# Patient Record
Sex: Female | Born: 1935 | Race: White | Hispanic: No | Smoking: Former smoker
Health system: Southern US, Community
[De-identification: ages and names within clinical notes are randomized; demographics above are authoritative.]

## PROBLEM LIST (undated history)

## (undated) DIAGNOSIS — H3530 Unspecified macular degeneration: Secondary | ICD-10-CM

## (undated) DIAGNOSIS — K649 Unspecified hemorrhoids: Secondary | ICD-10-CM

## (undated) DIAGNOSIS — N289 Disorder of kidney and ureter, unspecified: Secondary | ICD-10-CM

## (undated) DIAGNOSIS — C55 Malignant neoplasm of uterus, part unspecified: Secondary | ICD-10-CM

## (undated) DIAGNOSIS — K219 Gastro-esophageal reflux disease without esophagitis: Secondary | ICD-10-CM

## (undated) DIAGNOSIS — F32A Depression, unspecified: Secondary | ICD-10-CM

## (undated) DIAGNOSIS — K5792 Diverticulitis of intestine, part unspecified, without perforation or abscess without bleeding: Secondary | ICD-10-CM

## (undated) DIAGNOSIS — R350 Frequency of micturition: Secondary | ICD-10-CM

## (undated) DIAGNOSIS — N179 Acute kidney failure, unspecified: Secondary | ICD-10-CM

## (undated) DIAGNOSIS — C189 Malignant neoplasm of colon, unspecified: Secondary | ICD-10-CM

## (undated) DIAGNOSIS — E785 Hyperlipidemia, unspecified: Secondary | ICD-10-CM

## (undated) DIAGNOSIS — C787 Secondary malignant neoplasm of liver and intrahepatic bile duct: Secondary | ICD-10-CM

## (undated) DIAGNOSIS — H8110 Benign paroxysmal vertigo, unspecified ear: Secondary | ICD-10-CM

## (undated) DIAGNOSIS — I10 Essential (primary) hypertension: Secondary | ICD-10-CM

## (undated) DIAGNOSIS — T7840XA Allergy, unspecified, initial encounter: Secondary | ICD-10-CM

## (undated) DIAGNOSIS — F329 Major depressive disorder, single episode, unspecified: Secondary | ICD-10-CM

## (undated) HISTORY — PX: TONSILLECTOMY: SUR1361

## (undated) HISTORY — DX: Malignant neoplasm of uterus, part unspecified: C55

## (undated) HISTORY — DX: Allergy, unspecified, initial encounter: T78.40XA

## (undated) HISTORY — DX: Acute kidney failure, unspecified: N17.9

## (undated) HISTORY — PX: EYE SURGERY: SHX253

## (undated) HISTORY — PX: ABDOMINAL HYSTERECTOMY: SHX81

## (undated) HISTORY — DX: Gastro-esophageal reflux disease without esophagitis: K21.9

## (undated) HISTORY — DX: Benign paroxysmal vertigo, unspecified ear: H81.10

## (undated) HISTORY — DX: Hyperlipidemia, unspecified: E78.5

## (undated) HISTORY — DX: Unspecified hemorrhoids: K64.9

## (undated) HISTORY — DX: Diverticulitis of intestine, part unspecified, without perforation or abscess without bleeding: K57.92

---

## 2000-01-06 ENCOUNTER — Other Ambulatory Visit: Admission: RE | Admit: 2000-01-06 | Discharge: 2000-01-06 | Payer: Self-pay | Admitting: Internal Medicine

## 2000-11-12 ENCOUNTER — Other Ambulatory Visit: Admission: RE | Admit: 2000-11-12 | Discharge: 2000-11-12 | Payer: Self-pay | Admitting: Gynecology

## 2000-11-12 ENCOUNTER — Encounter (INDEPENDENT_AMBULATORY_CARE_PROVIDER_SITE_OTHER): Payer: Self-pay

## 2002-02-15 ENCOUNTER — Other Ambulatory Visit: Admission: RE | Admit: 2002-02-15 | Discharge: 2002-02-15 | Payer: Self-pay | Admitting: Gynecology

## 2002-07-12 ENCOUNTER — Other Ambulatory Visit: Admission: RE | Admit: 2002-07-12 | Discharge: 2002-07-12 | Payer: Self-pay | Admitting: Gynecology

## 2002-07-12 ENCOUNTER — Ambulatory Visit: Admission: RE | Admit: 2002-07-12 | Discharge: 2002-07-12 | Payer: Self-pay | Admitting: Gynecology

## 2002-07-12 ENCOUNTER — Encounter (INDEPENDENT_AMBULATORY_CARE_PROVIDER_SITE_OTHER): Payer: Self-pay | Admitting: Specialist

## 2002-10-24 ENCOUNTER — Other Ambulatory Visit: Admission: RE | Admit: 2002-10-24 | Discharge: 2002-10-24 | Payer: Self-pay | Admitting: Gynecology

## 2003-01-17 ENCOUNTER — Other Ambulatory Visit: Admission: RE | Admit: 2003-01-17 | Discharge: 2003-01-17 | Payer: Self-pay | Admitting: Gynecology

## 2003-01-17 ENCOUNTER — Ambulatory Visit: Admission: RE | Admit: 2003-01-17 | Discharge: 2003-01-17 | Payer: Self-pay | Admitting: Gynecology

## 2003-01-17 ENCOUNTER — Encounter (INDEPENDENT_AMBULATORY_CARE_PROVIDER_SITE_OTHER): Payer: Self-pay | Admitting: Specialist

## 2003-04-17 ENCOUNTER — Ambulatory Visit: Admission: RE | Admit: 2003-04-17 | Discharge: 2003-04-17 | Payer: Self-pay | Admitting: Gynecology

## 2003-04-17 ENCOUNTER — Other Ambulatory Visit: Admission: RE | Admit: 2003-04-17 | Discharge: 2003-04-17 | Payer: Self-pay | Admitting: Gynecology

## 2003-04-17 ENCOUNTER — Encounter (INDEPENDENT_AMBULATORY_CARE_PROVIDER_SITE_OTHER): Payer: Self-pay | Admitting: *Deleted

## 2003-09-05 ENCOUNTER — Encounter (INDEPENDENT_AMBULATORY_CARE_PROVIDER_SITE_OTHER): Payer: Self-pay | Admitting: Specialist

## 2003-09-05 ENCOUNTER — Ambulatory Visit: Admission: RE | Admit: 2003-09-05 | Discharge: 2003-09-05 | Payer: Self-pay | Admitting: Gynecology

## 2003-09-05 ENCOUNTER — Other Ambulatory Visit: Admission: RE | Admit: 2003-09-05 | Discharge: 2003-09-05 | Payer: Self-pay | Admitting: Gynecology

## 2004-01-16 ENCOUNTER — Encounter (INDEPENDENT_AMBULATORY_CARE_PROVIDER_SITE_OTHER): Payer: Self-pay | Admitting: Specialist

## 2004-01-16 ENCOUNTER — Other Ambulatory Visit: Admission: RE | Admit: 2004-01-16 | Discharge: 2004-01-16 | Payer: Self-pay | Admitting: Gynecology

## 2004-01-16 ENCOUNTER — Ambulatory Visit: Admission: RE | Admit: 2004-01-16 | Discharge: 2004-01-16 | Payer: Self-pay | Admitting: Gynecology

## 2004-07-22 ENCOUNTER — Ambulatory Visit: Admission: RE | Admit: 2004-07-22 | Discharge: 2004-07-22 | Payer: Self-pay | Admitting: Gynecology

## 2004-07-22 ENCOUNTER — Encounter (INDEPENDENT_AMBULATORY_CARE_PROVIDER_SITE_OTHER): Payer: Self-pay | Admitting: Specialist

## 2005-01-08 ENCOUNTER — Ambulatory Visit: Payer: Self-pay | Admitting: Gastroenterology

## 2005-01-09 ENCOUNTER — Ambulatory Visit: Admission: RE | Admit: 2005-01-09 | Discharge: 2005-01-09 | Payer: Self-pay | Admitting: Gynecology

## 2005-01-21 ENCOUNTER — Ambulatory Visit: Payer: Self-pay | Admitting: Gastroenterology

## 2005-07-17 ENCOUNTER — Ambulatory Visit: Admission: RE | Admit: 2005-07-17 | Discharge: 2005-07-17 | Payer: Self-pay | Admitting: Gynecology

## 2005-07-17 ENCOUNTER — Encounter (INDEPENDENT_AMBULATORY_CARE_PROVIDER_SITE_OTHER): Payer: Self-pay | Admitting: Specialist

## 2005-07-17 ENCOUNTER — Other Ambulatory Visit: Admission: RE | Admit: 2005-07-17 | Discharge: 2005-07-17 | Payer: Self-pay | Admitting: Gynecology

## 2006-01-20 ENCOUNTER — Other Ambulatory Visit: Admission: RE | Admit: 2006-01-20 | Discharge: 2006-01-20 | Payer: Self-pay | Admitting: Gynecology

## 2006-01-20 ENCOUNTER — Ambulatory Visit: Admission: RE | Admit: 2006-01-20 | Discharge: 2006-01-20 | Payer: Self-pay | Admitting: Gynecology

## 2006-01-20 ENCOUNTER — Encounter (INDEPENDENT_AMBULATORY_CARE_PROVIDER_SITE_OTHER): Payer: Self-pay | Admitting: Specialist

## 2006-07-30 ENCOUNTER — Ambulatory Visit: Admission: RE | Admit: 2006-07-30 | Discharge: 2006-07-30 | Payer: Self-pay | Admitting: Gynecology

## 2006-07-30 ENCOUNTER — Other Ambulatory Visit: Admission: RE | Admit: 2006-07-30 | Discharge: 2006-07-30 | Payer: Self-pay | Admitting: Gynecology

## 2007-01-18 ENCOUNTER — Encounter (INDEPENDENT_AMBULATORY_CARE_PROVIDER_SITE_OTHER): Payer: Self-pay | Admitting: *Deleted

## 2007-01-18 ENCOUNTER — Ambulatory Visit: Admission: RE | Admit: 2007-01-18 | Discharge: 2007-01-18 | Payer: Self-pay | Admitting: Gynecology

## 2007-01-18 ENCOUNTER — Other Ambulatory Visit: Admission: RE | Admit: 2007-01-18 | Discharge: 2007-01-18 | Payer: Self-pay | Admitting: Gynecology

## 2008-01-06 ENCOUNTER — Encounter: Payer: Self-pay | Admitting: Gynecology

## 2008-01-06 ENCOUNTER — Ambulatory Visit: Admission: RE | Admit: 2008-01-06 | Discharge: 2008-01-06 | Payer: Self-pay | Admitting: Gynecology

## 2008-01-06 ENCOUNTER — Other Ambulatory Visit: Admission: RE | Admit: 2008-01-06 | Discharge: 2008-01-06 | Payer: Self-pay | Admitting: Gynecology

## 2009-02-01 ENCOUNTER — Encounter: Payer: Self-pay | Admitting: Gynecology

## 2009-02-01 ENCOUNTER — Other Ambulatory Visit: Admission: RE | Admit: 2009-02-01 | Discharge: 2009-02-01 | Payer: Self-pay | Admitting: Gynecology

## 2009-02-01 ENCOUNTER — Ambulatory Visit: Admission: RE | Admit: 2009-02-01 | Discharge: 2009-02-01 | Payer: Self-pay | Admitting: Gynecology

## 2009-03-21 ENCOUNTER — Encounter: Admission: RE | Admit: 2009-03-21 | Discharge: 2009-03-21 | Payer: Self-pay | Admitting: Internal Medicine

## 2010-01-08 ENCOUNTER — Ambulatory Visit: Admission: RE | Admit: 2010-01-08 | Discharge: 2010-01-08 | Payer: Self-pay | Admitting: Gynecology

## 2010-01-08 ENCOUNTER — Other Ambulatory Visit: Admission: RE | Admit: 2010-01-08 | Discharge: 2010-01-08 | Payer: Self-pay | Admitting: Gynecology

## 2010-10-05 DIAGNOSIS — H8110 Benign paroxysmal vertigo, unspecified ear: Secondary | ICD-10-CM

## 2010-10-05 HISTORY — DX: Benign paroxysmal vertigo, unspecified ear: H81.10

## 2011-01-28 ENCOUNTER — Ambulatory Visit: Payer: Medicare Other | Attending: Gynecology | Admitting: Gynecology

## 2011-01-28 ENCOUNTER — Other Ambulatory Visit: Payer: Self-pay | Admitting: Gynecology

## 2011-01-28 ENCOUNTER — Other Ambulatory Visit (HOSPITAL_COMMUNITY)
Admission: RE | Admit: 2011-01-28 | Discharge: 2011-01-28 | Disposition: A | Discharge status: Home / Self Care | Payer: Medicare Other | Source: Ambulatory Visit | Attending: Gynecology | Admitting: Gynecology

## 2011-01-28 DIAGNOSIS — M5126 Other intervertebral disc displacement, lumbar region: Secondary | ICD-10-CM | POA: Insufficient documentation

## 2011-01-28 DIAGNOSIS — H269 Unspecified cataract: Secondary | ICD-10-CM | POA: Insufficient documentation

## 2011-01-28 DIAGNOSIS — Z8542 Personal history of malignant neoplasm of other parts of uterus: Secondary | ICD-10-CM | POA: Insufficient documentation

## 2011-01-28 DIAGNOSIS — Z8540 Personal history of malignant neoplasm of unspecified female genital organ: Secondary | ICD-10-CM | POA: Insufficient documentation

## 2011-01-28 DIAGNOSIS — Z09 Encounter for follow-up examination after completed treatment for conditions other than malignant neoplasm: Secondary | ICD-10-CM | POA: Insufficient documentation

## 2011-01-28 DIAGNOSIS — H3530 Unspecified macular degeneration: Secondary | ICD-10-CM | POA: Insufficient documentation

## 2011-01-29 NOTE — Consult Note (Signed)
  [name], [name] NO.:  [id number]  MEDICAL RECORD NO.:  [id number]          PATIENT TYPE:  LOCATION:                                 FACILITY:  PHYSICIAN:  De Blanch, M.D. DATE OF BIRTH:  DATE OF CONSULTATION:  01/28/2011 DATE OF DISCHARGE:                                CONSULTATION   CHIEF COMPLAINT:  Endometrial cancer.  INTERVAL HISTORY:  The patient returns today for annual exam and followup of endometrial cancer initially diagnosed in June 2003.  Since her last visit, she has done well.  She denies any GI or GU symptoms, has no pelvic pain, pressure, vaginal bleeding or discharge.  Since her last visit, she had cataract surgery which she considers successful.  HISTORY OF PRESENT ILLNESS:  Stage IA grade 3 endometrial carcinoma, undergone initial surgery in June 2003.  She did not receive any adjuvant therapy and has been followed since then with no evidence of recurrent disease.  PAST MEDICAL HISTORY/MEDICAL ILLNESSES: 1. Macular degeneration. 2. Herniated lumbar disk. 3. Cataracts.  PAST SURGICAL HISTORY:  D and C, tonsil and adenoidectomy, TAH-BSO, pelvic and periaortic lymphadenectomy in 2003.  Cataract surgery in 2011.  FAMILY HISTORY:  Negative for gynecologic, breast, or colon cancer.  SOCIAL HISTORY:  The patient is widowed.  She does not smoke.  She has regular exercise program.  DRUG ALLERGIES:  None.  CURRENT MEDICATIONS:  Clarinex p.r.n.  OBSTETRICAL HISTORY:  Gravida 2.  REVIEW OF SYSTEMS:  Ten-point comprehensive review of systems negative except as noted above.  PHYSICAL EXAMINATION:  VITAL SIGNS:  Weight 166 pounds, blood pressure 126/60. GENERAL:  The patient is a healthy white female, in no acute distress. HEENT:  Negative. NECK:  Supple without thyromegaly.  There is no supraclavicular or inguinal adenopathy. BREASTS:  Without masses, discharge or skin changes. ABDOMEN:  Soft, nontender.  No  mass, organomegaly, ascites or hernias noted. PELVIC EXAM:  EGBUS, vagina, bladder, urethra are normal.  Cervix and uterus are surgically absent.  Adnexa without masses.  Rectovaginal exam confirms. EXTREMITIES:  Lower extremities without edema or varicosities.  IMPRESSION:  Stage IA grade 3 endometrial carcinoma in 2003, no evidence of recurrent disease.  Pap smears were obtained.  At this juncture, we will release the patient from our practice and return to the care of her primary care physician, Dr. Timothy Lasso.     De Blanch, M.D.     DC/MEDQ  D:  01/28/2011  T:  01/28/2011  Job:  [id number]  cc:   Gwen Pounds, MD Fax: [phone]  Telford Nab, R.N. [phone] [address] [place], [place] [address]  Electronically Signed by De Blanch M.D. on 01/29/2011 03:55:12 PM

## 2011-02-17 NOTE — Consult Note (Signed)
[name], [name] NO.:  [id number]   MEDICAL RECORD NO.:  [id number]          PATIENT TYPE:  OUT   LOCATION:  GYN                          FACILITY:  [facility]   PHYSICIAN:  De Blanch, M.D.DATE OF BIRTH:  21-Oct-1927   DATE OF CONSULTATION:  02/01/2009  DATE OF DISCHARGE:                                 CONSULTATION   CHIEF COMPLAINT:  Endometrial cancer.   INTERVAL HISTORY:  The patient returns today for continuing follow-up of  her endometrial cancer.  Since her last visit, she has done well.  She  reports she is not exercising quite as much as she did in years past but  continues to have an active program.  She had mammograms in the past  couple months that she reports were negative.  She does not have any  breast symptoms.  She has no GI or GU symptoms.  There is no pelvic  pain, pressure, vaginal bleeding or discharge.   HISTORY OF PRESENT ILLNESS:  Stage IA grade 3 endometrial cancer.  The  patient underwent initial surgical staging in June of 2003.  She did not  receive any adjuvant therapy, has been followed since that time with no  evidence of current disease.   PAST MEDICAL HISTORY:  Macular degeneration (stable).   PAST SURGICAL HISTORY:  Dilatation and curettage, tonsils and  adenoidectomy, TAH/BSO, pelvic and periaortic lymphadenectomy.   FAMILY HISTORY:  Negative for gynecological breast or colon cancer.   SOCIAL HISTORY:  The patient is widowed.  She does not smoke.   DRUG ALLERGIES:  None.   CURRENT MEDICATIONS:  Clarinex p.r.n.   REVIEW OF SYSTEMS:  Ten-point comprehensive review of systems negative  except as noted above.   PHYSICAL EXAMINATION:  Weight 171 pounds, blood pressure 130/70.  GENERAL:  The patient is healthy, elderly, white female in no acute  distress.  HEENT:  Negative.  NECK:  Supple without thyromegaly.  There was no supraclavicular or  inguinal adenopathy.  ABDOMEN:  Soft, nontender.  No masses,  organomegaly, ascites, or hernias  are noted.  PELVIC EXAM:  EGBUS, vagina, bladder, urethra are normal but slightly  atrophic. Cuff is well healed.  No lesions are noted.  Cervix and uterus  are surgical absent.  Adnexa without masses.  Rectovaginal exam  confirmed.  LOWER EXTREMITIES:  Without edema or varicosities.   IMPRESSION:  Stage IA grade 3 endometrial cancer.  No evidence of  recurrent disease.  Pap smears were obtained.  The patient will return  to see Charlene [name] in 1 year.  She will continue to have mammograms on an annual  basis.      De Blanch, M.D.  Electronically Signed     DC/MEDQ  D:  02/01/2009  T:  02/01/2009  Job:  [id number]   cc:   Telford Nab, R.N.  [address]  [place], [place] [address]   Gwen Pounds, MD  Fax: [phone]

## 2011-02-17 NOTE — Consult Note (Signed)
Charlene [name], LEUTHOLD NO.:  [id number]   MEDICAL RECORD NO.:  [id number]          PATIENT TYPE:  OUT   LOCATION:  GYN                          FACILITY:  [facility]   PHYSICIAN:  De Blanch, M.D.DATE OF BIRTH:  31-Aug-1928   DATE OF CONSULTATION:  01/06/2008  DATE OF DISCHARGE:                                 CONSULTATION   CHIEF COMPLAINT:  Endometrial cancer, well-woman check.   INTERVAL HISTORY:  The patient returns today for continuing followup of  endometrial cancer and health maintenance.  Since her last visit, she  has done well.  She denies any GI or GU symptoms, has no pelvic pain,  pressure, vaginal bleeding or discharge.  She continues to have  mammograms on an annual basis.  She does not have any breast symptoms.   HISTORY OF PRESENT ILLNESS:  Stage I-A, grade 3 endometrial cancer.  She  underwent initial surgical staging, June 2003.  She did not receive any  adjuvant therapy and has been followed since that time with no evidence  of disease.   PAST MEDICAL HISTORY:  Macular degeneration.   PAST SURGICAL HISTORY:  Dilatation and curettage, tonsils and  adenoidectomy, TAH, BSO, pelvic and periaortic lymphadenectomy.   FAMILY HISTORY:  Negative for gynecologic, breast or colon cancer.   SOCIAL HISTORY:  The patient is widowed.  She does not smoke.  She  exercises regularly, including swimming and Pilates.  She has a  grandson, who has started at [employer] as an Nurse, children's major, consistent with  the interests of his grandfather and father.   DRUG ALLERGIES:  None.   CURRENT MEDICATIONS:  Clarinex.   REVIEW OF SYSTEMS:  Ten-point comprehensive review of systems negative,  except as noted above.   PHYSICAL EXAM:  Weight 168 pounds, blood pressure 130/70, pulse 80,  respiratory rate 20.  GENERAL:  Patient is a healthy white female, in no acute distress.  HEENT:  Negative.  NECK:  Supple without thyromegaly.  There is no supraclavicular or  inguinal adenopathy.  ABDOMEN:  Soft, nontender, no masses, organomegaly, ascites or hernias  noted.  BREASTS:  Without masses, discharge or skin changes.  PELVIC EXAM:  EG/BUS, vagina, bladder and urethra are normal, but  atrophic.  Cervix and uterus are surgically absent.  No lesions are  noted.  Bimanual and rectovaginal exam reveal no masses, induration or  nodularity.  LOWER EXTREMITIES:  Without edema or varicosities.   IMPRESSION:  Stage I-A, grade 3 endometrial cancer.  No evidence of  recurrent disease.  Pap smears were obtained.   Normal physical examination.  The patient will continue to have annual  mammograms and return to see me in one year for continuing followup.      De Blanch, M.D.  Electronically Signed     DC/MEDQ  D:  01/06/2008  T:  01/06/2008  Job:  [id number]   cc:   Telford Nab, R.N.  [address]  [place], [place] [address]   Gwen Pounds, MD  Fax: [phone]

## 2011-02-20 NOTE — Consult Note (Signed)
Charlene [name], BELLEVILLE NO.:  [id number]   MEDICAL RECORD NO.:  [id number]          PATIENT TYPE:  OUT   LOCATION:  GYN                          FACILITY:  [facility]   PHYSICIAN:  De Blanch, M.D.DATE OF BIRTH:  1928-01-10   DATE OF CONSULTATION:  DATE OF DISCHARGE:                                   CONSULTATION   A 77 year old white female returns for followup of a stage IA, grade 3  endometrial carcinoma.   INTERVAL HISTORY:  Since her last visit, she has done well.  She denies any  GI or GU symptoms.  She has no pelvic pain, pressure, vaginal bleeding, or  discharge.  Her functional status has been excellent.  She continues to  exercise regularly.   HISTORY OF PRESENT ILLNESS:  Patient underwent initial surgical resection  and staging of a grade endometrial cancer in June, 2003.  She has stage IA,  grade 3 lesion and received no adjuvant therapy.   PAST MEDICAL HISTORY:  None.   PAST SURGICAL HISTORY:  1.  D&C.  2.  Tonsillectomy and adenoidectomy.  3.  TAH/BSO, pelvic and periaortic lymphadenectomy.   FAMILY HISTORY:  Negative for gynecologic, breast, or colon cancer.   SOCIAL HISTORY:  Patient is widowed.  She does not smoke.   DRUG ALLERGIES:  None.   CURRENT MEDICATIONS:  Clarinex and Nasonex p.r.n.   REVIEW OF SYSTEMS:  A 10-point comprehensive review of systems is performed  and is negative, except as noted above.   PHYSICAL EXAMINATION:  VITAL SIGNS:  Weight 161 pounds, (down 3 pounds).  Blood pressure 130/80.  GENERAL:  The patient is a healthy elderly female in no acute distress.  HEENT:  Negative.  NECK:  Supple without thyromegaly.  There is no supraclavicular or inguinal  adenopathy.  ABDOMEN:  Soft and nontender.  No mass, organomegaly, ascites, or hernias  are noted.  BREASTS:  Without masses, discharge, or skin changes.  PELVIC:  EG/BUS, vagina, bladder, and urethra are normal but atrophic.  No  lesions are noted.  Bimanual  and rectovaginal exam reveals no masses,  induration, or nodularity.   IMPRESSION:  Stage IA, grade 3 endometrial cancer in June, 2003.  No  evidence for recurrent disease.   PLAN:  Pap smears are obtained.  Patient is to return in six months for  continuing surveillance.   As noted, she had mammograms on March 24, 2005, which were negative.  She  will have them repeated in one year.      De Blanch, M.D.  Electronically Signed     DC/MEDQ  D:  07/17/2005  T:  07/17/2005  Job:  [id number]   cc:   Telford Nab, R.N.  [address]  [place], [place] [address]   Gwen Pounds, MD  Fax: [phone]   Leatha Gilding. Mezer, M.D.  Fax: [phone]

## 2011-02-20 NOTE — Consult Note (Signed)
NAMESHIRELY, TOREN NO.:  [id number]   MEDICAL RECORD NO.:  [id number]          PATIENT TYPE:  OUT   LOCATION:  GYN                          FACILITY:  [facility]   PHYSICIAN:  De Blanch, M.D.DATE OF BIRTH:  1928/08/18   DATE OF CONSULTATION:  01/09/2005  DATE OF DISCHARGE:                                   CONSULTATION   REASON FOR CONSULTATION:  A 77 year old white female returns for continuing  follow-up of stage Ia grade 3 endometrial cancer.   INTERVAL HISTORY:  Since her last visit, the patient has done well. She  denies any GI or GU symptoms. Has no pelvic pain, pressure, vaginal  bleeding, or discharge. She remains very active. She has had new onset of  macular degeneration since her last visit but with limited sight loss.   HISTORY OF PRESENT ILLNESS:  The patient underwent initial surgical  resection and staging June 2003. She was found to have a stage Ia grade 3  endometrial cancer and received no adjuvant therapy.   PAST MEDICAL HISTORY:  None.   PAST SURGICAL HISTORY:  D&C, tonsil and adenoidectomy, TAH/BSO, pelvic and  periaortic lymphadenectomy.   FAMILY HISTORY:  Negative for gynecologic, breast, or colon cancer.   SOCIAL HISTORY:  The patient is widowed. She does not smoke. She has an  active exercise program.   DRUG ALLERGIES:  None.   CURRENT MEDICATIONS:  Clarinex, Nasonex.   REVIEW OF SYSTEMS:  Negative except as noted above.   PHYSICAL EXAMINATION:  VITAL SIGNS:  Weight 164 pounds, blood pressure  130/80.  GENERAL:  The patient is a healthy elderly white female in no acute  distress.  HEENT:  Negative.  NECK:  Supple without thyromegaly. There is no supraclavicular, axillary, or  inguinal adenopathy.  BREASTS:  Without masses, discharge, or skin changes.  ABDOMEN:  Soft, nontender. No mass, organomegaly, ascites, or hernias noted.  PELVIC:  EG/BUS, vagina, bladder, urethra are normal but atrophic. No  lesions are  noted. Pap smears are obtained. Bimanual and rectovaginal exam  reveal no masses, induration, or nodularity.   IMPRESSION:  Stage Ia grade 3 endometrial cancer, June 2003. No evidence of  recurrent disease.   PLAN:  Pap smears are obtained. The patient will return to see [place] in 6  months for continuing surveillance.      DC/MEDQ  D:  01/09/2005  T:  01/09/2005  Job:  [id number]   cc:   Leatha Gilding. Mezer, M.D.  [address]  [place]  [place] [address]  Fax: [phone]   Gwen Pounds, MD  Fax: [phone]   Telford Nab, R.N.  [phone] [address]  [place], [place] [address]

## 2011-02-20 NOTE — Consult Note (Signed)
NAMELAWREN, SEXSON NO.:  [id number]   MEDICAL RECORD NO.:  [id number]          PATIENT TYPE:  OUT   LOCATION:  GYN                          FACILITY:  [facility]   PHYSICIAN:  De Blanch, M.D.DATE OF BIRTH:  [birth year]   DATE OF CONSULTATION:  DATE OF DISCHARGE:                                 CONSULTATION   CHIEF COMPLAINT:  Endometrial cancer.   INTERVAL HISTORY:  This 77 year old white female returns for continued  follow-up of endometrial cancer.  Since her last visit she has done  well.  She has not been exercising quite as much as she has in the past.  She denies any GI or GU symptoms.  There is no pelvic pain, pressure,  vaginal bleeding, or discharge.  Functional status is quite good.   HISTORY OF PRESENT ILLNESS:  Stage IA, grade 3 endometrial cancer,  undergoing initial surgical stage June, 2003.  She did not receive any  adjuvant therapy.   PAST MEDICAL HISTORY:  Macular degeneration.   PAST SURGICAL HISTORY:  D&C, tonsils and adenoidectomy, TAH/BSO, pelvic  and periaortic lymphadenectomy.   FAMILY HISTORY:  Negative for gynecologic, breast, or colon cancer.   SOCIAL HISTORY:  The patient is widowed.  She does not smoke.  She  exercises regularly, including weight lifting and swimming.   DRUG ALLERGIES:  None.   CURRENT MEDICATIONS:  Clarinex, Nasonex, and Prilosec p.r.n.   REVIEW OF SYSTEMS:  A ten-point comprehensive review of system negative  except as noted above.   PHYSICAL EXAMINATION:  VITAL SIGNS:  Weight 166 pounds, blood pressure  138/70, pulse 80, respiratory rate 20.  GENERAL:  In general, the patient is a healthy white female. In no acute  distress.  HEENT:  Negative.  NECK:  Supple, without thyromegaly.  There is no supraclavicular or  inguinal adenopathy.  ABDOMEN:  Soft, nontender.  No mass or organomegaly or signs of hernia  is noted.  PELVIC EXAM:  EG, BUS, vagina, urethra are normal.  Cervix and uterus is  surgically absent.  Adnexa without masses; rectovaginal exam confirms.  LOWER EXTREMITIES:  Without edema or varicosities.   IMPRESSION:  Stage IA, grade 3 endometrial carcinoma.  No evidence of  recurrent disease.   PLAN:  Pap smears were obtained.  The patient returned to see me in one  year for continuing surveillance.      De Blanch, M.D.  Electronically Signed     DC/MEDQ  D:  01/18/2007  T:  01/18/2007  Job:  [id number]   cc:   Telford Nab, R.N.  [address]  [place], [place] [address]   Leatha Gilding. Mezer, M.D.  Fax: [phone]   Gwen Pounds, MD  Fax: [phone]

## 2011-02-20 NOTE — Consult Note (Signed)
Charlene [name], SPAGNUOLO NO.:  [id number]   MEDICAL RECORD NO.:  [id number]                   PATIENT TYPE:  OUT   LOCATION:  GYN                                  FACILITY:  [facility]   PHYSICIAN:  De Blanch, M.D.         DATE OF BIRTH:  18-Oct-1927   DATE OF CONSULTATION:  04/17/2003  DATE OF DISCHARGE:                                   CONSULTATION   INTRODUCTION:  This 77 year old white female returns for continuing follow-  up of endometrial cancer.   INTERVAL HISTORY:  Since her last visit she has done well.  She denies any  GI or GU symptoms.  Has no pelvic pain, pressure, vaginal bleeding, or  discharge.  She has continued to remain active in [employer] and does  lap pool swimming four to five times a week.   HISTORY OF PRESENT ILLNESS:  The patient underwent a TAH/BSO, pelvic and  periaortic lymphadenectomy at [facility] in June 2003.  Final pathology showed no residual disease, and she received no  postoperative therapy.   PAST MEDICAL HISTORY:  Medical illnesses:  None.   PAST SURGICAL HISTORY:  1. D&C.  2. Tonsillectomy and adenoidectomy.  3. TAH/BSO, pelvic and periaortic lymphadenectomy in 2003.   FAMILY HISTORY:  Negative for gynecologic, breast, or colon cancer.   SOCIAL HISTORY:  The patient is widowed.  She does not smoke.   DRUG ALLERGIES:  None.   CURRENT MEDICATIONS:  Clarinex and Nasonex.   REVIEW OF SYSTEMS:  Negative except as noted above.   PHYSICAL EXAMINATION:  VITAL SIGNS:  Weight 152 pounds.  Blood pressure  120/62.  GENERAL:  Healthy white female in no acute distress.  HEENT:  Negative.  NECK:  Supple.  Without thyromegaly.  There is no supraclavicular or  inguinal adenopathy.  ABDOMEN:  Soft, nontender.  No masses, organomegaly, ascites, or hernias are  noted.  PELVIC:  EGBUS, vagina, bladder, urethra are normal.  The vaginal cuff was  well healed.  No lesion is noted.   Bimanual and rectovaginal exam reveal no  masses, induration, or nodularity.  EXTREMITIES:  Lower extremities without edema or varicosities.   IMPRESSION:  Stage IA, grade 3 endometrial adenocarcinoma.   PLAN:  Pap smears are obtained today.  The patient will return in four  months for continuing follow-up.                                               De Blanch, M.D.    DC/MEDQ  D:  04/17/2003  T:  04/17/2003  Job:  [id number]   cc:   Telford Nab, R.N.  [address]  [place], [place] [address]   Gwen Pounds, M.D.  [address]  [place]  [place] [address]  Fax: [phone]   Leatha Gilding. Mezer, M.D.  [address]  [place]  [place] [address]  Fax: [phone]

## 2011-02-20 NOTE — Consult Note (Signed)
Charlene [name], Charlene [name] NO.:  [id number]   MEDICAL RECORD NO.:  [id number]                   PATIENT TYPE:  OUT   LOCATION:  GYN                                  FACILITY:  [facility]   PHYSICIAN:  De Blanch, M.D.         DATE OF BIRTH:  [birth year]   DATE OF CONSULTATION:  01/17/2003  DATE OF DISCHARGE:                                   CONSULTATION   HISTORY OF PRESENT ILLNESS:  This 77 year old white female returns for  continuing follow-up of a grade 3, stage IIIA endometrial adenocarcinoma.  She underwent TAH, BSO, and pelvic and periaortic lymphadenectomy at [facility] in March 23, 2002.  The final pathology revealed no  residual disease or any metastatic disease.  The patient received no  postoperative therapy.   Since her last visit, she has done well.  She denies any GI or GU symptoms.  She has no pelvic pain, pressure, vaginal bleeding, or discharge.  Her  functional status is excellent.  She has been on Weight [employer]chers diet and  has lost approximately 10 pounds.  She is working out at [employer] on a very  consistent basis.   PAST MEDICAL HISTORY:  No medical illnesses.   PAST SURGICAL HISTORY:  1. D&C.  2. Tonsillectomy and adenoidectomy.  3. Total abdominal hysterectomy and bilateral salpingo-oophorectomy with     pelvic and periaortic lymphadenectomy in 2003.   FAMILY HISTORY:  Negative for gynecologic, breast, or colon cancer.   SOCIAL HISTORY:  The patient is a widow.  She does not smoke.   DRUG ALLERGIES:  None.   CURRENT MEDICATIONS:  1. Clarinex p.r.n.  2. Nasonex p.r.n.   REVIEW OF SYSTEMS:  Negative for gynecologic, breast, GI, GU,  cardiovascular, musculoskeletal, or neurologic symptoms.   PHYSICAL EXAMINATION:  WEIGHT:  150 pounds.  GENERAL APPEARANCE:  The patient is a healthy, elderly, white female who  appears younger than stated age.  HEENT:  Negative.  NECK:  Supple without  thyromegaly.  LYMPHATICS:  There is no supraclavicular or inguinal adenopathy.  ABDOMEN:  Soft and nontender.  No mass, organomegaly, ascites, or hernias  noted.  PELVIC:  EG, BUS, vagina, bladder, and urethra are normal, but atrophic.  Bimanual and rectovaginal exam reveal no masses, induration, or nodularity.  EXTREMITIES:  The lower extremities are without edema or varicosities.    IMPRESSION:  Stage IA, grade 3 endometrial cancer with no evidence of  recurrent disease.   PLAN:  The patient is to return to see [place] in three months for continuing  follow-up.  Pap smears were obtained today.                                               De Blanch, M.D.  DC/MEDQ  D:  01/17/2003  T:  01/17/2003  Job:  [id number]   cc:   Telford Nab, R.N.

## 2011-02-20 NOTE — Consult Note (Signed)
Charlene [name], ROEDEL NO.:  [id number]   MEDICAL RECORD NO.:  [id number]          PATIENT TYPE:  OUT   LOCATION:  GYN                          FACILITY:  [facility]   PHYSICIAN:  De Blanch, M.D.DATE OF BIRTH:  Feb 11, 1928   DATE OF CONSULTATION:  DATE OF DISCHARGE:                                   CONSULTATION   CHIEF COMPLAINT:  Endometrial cancer.   INTERVAL HISTORY:  Since her last visit, the patient has done well.  She  denies any GI or GU symptoms.  There is no pelvic pain, pressure, vaginal  bleeding, or discharge.  She continues to be quite active in the community  and also is working out now adding Pilates to her work out schedule.  Her  macular degeneration apparently is stable with no further visual loss.   HISTORY OF PRESENT ILLNESS:  The patient has stage IA grade 3 endometrial  carcinoma undergoing initial surgical staging June 2003.  She did not  receive any adjuvant therapy and has been followed with no of recurrent  disease.   PAST MEDICAL HISTORY:  Macular degeneration.   PAST SURGICAL HISTORY:  1.  D&C.  2.  Tonsils and adenoidectomy.  3.  TAH-BSO.  4.  Pelvic and __________  lymphadenectomy, 2003.   FAMILY HISTORY:  Negative for gynecologic, breast, or colon cancer.   SOCIAL HISTORY:  The patient is widowed.  She does not smoke.  She exercises  regularly.   DRUG ALLERGIES:  None.   CURRENT MEDICATIONS:  Clarinex, Nasonex, and Prilosec.   REVIEW OF SYSTEMS:  A 10-point comprehensive review of systems is negative  except as noted above.   PHYSICAL EXAMINATION:  VITAL SIGNS:  Weight 162 pounds, blood pressure  130/70, pulse 80, respiratory rate 20.  GENERAL:  The patient is a healthy white female in no acute distress.  HEENT:  Negative.  NECK:  Supple without thyromegaly.  There is no supraclavicular or inguinal  adenopathy.  ABDOMEN:  Soft, nontender.  No masses or organomegaly, ascites, or hernias  are noted.  BREASTS:   Without masses, discharge, or skin changes.  PELVIC:  EG/BUS, vagina, bladder, urethra are normal.  Cervix and uterus are  surgically absent.  The vagina is atrophic.  No lesions are noted.  Bimanual  and rectovaginal exam reveal no masses, induration, or nodularity.   IMPRESSION:  Stage IA grade 3 endometrial cancer, June 2003, no evidence for  recurrent disease.   PLAN:  Pap smears were obtained.  The patient will return in six months for  continued followup.      De Blanch, M.D.  Electronically Signed     DC/MEDQ  D:  01/20/2006  T:  01/20/2006  Job:  [id number]   cc:   Leatha Gilding. Mezer, M.D.  Fax: [phone]   Telford Nab, R.N.  [address]  [place], [place] [address]

## 2011-02-20 NOTE — Consult Note (Signed)
Charlene [name], Charlene [name] NO.:  [id number]   MEDICAL RECORD NO.:  [id number]                   PATIENT TYPE:  OUT   LOCATION:  GYN                                  FACILITY:  [facility]   PHYSICIAN:  De Blanch, M.D.         DATE OF BIRTH:  10-08-1927   DATE OF CONSULTATION:  09/05/2003  DATE OF DISCHARGE:                                   CONSULTATION   CHIEF COMPLAINT:  Follow up endometrial cancer.   INTERVAL HISTORY:  Since her last visit the patient has done well.  She  denies any GI symptoms.  She has no pelvic pain, pressure, vaginal bleeding  or discharge.  She does note that she has some urge incontinence.  In  addition, she has noted increasing hot flashes, approximately four times a  day, as well as night sweats.  She is not taking any hormone replacement  therapy.  She has had no change in her current medications.  She continues  to be physically active and works out with weights several times a week.  She continues to volunteer at the [employer] as well.   HISTORY OF PRESENT ILLNESS:  The patient has grade 3 endometrial carcinoma  undergoing a TAH, BSO, pelvic and peri-aortic lymphadenectomy in June 2003.  She had no residual disease, and therefore received no additional therapy.   PAST MEDICAL HISTORY:  None.   PAST SURGICAL HISTORY:  1. D&C.  2. Tonsils and adenoidectomy.  3. TAH, BSO, pelvic, and peri-aortic lymphadenectomy.   FAMILY HISTORY:  Negative for gynecologic, breast, or colon cancer.   SOCIAL HISTORY:  The patient is widowed.  She is active in the community.  Works out nearly daily, and does not smoke.   DRUG ALLERGIES:  None.   CURRENT MEDICATIONS:  1. Clarinex.  2. Nasonex.   REVIEW OF SYSTEMS:  Negative except as noted above.   PHYSICAL EXAMINATION:  VITAL SIGNS:  Weight 152 pounds, blood pressure  120/62.  GENERAL:  The patient is a healthy white female in no acute distress.  HEENT:   Negative.  NECK:  Supple without thyromegaly.  There is no supraclavicular or inguinal  adenopathy.  BREASTS:  Without masses, discharge, or skin changes.  ABDOMEN:  Soft, nontender.  No mass, organomegaly, ascites, or hernias are  noted.  PELVIC:  EGBUS, vagina, bladder, and urethra are normal, but atrophic.  No  lesions are noted. Bimanual and rectovaginal exam reveal no masses,  induration, or nodularity.  EXTREMITIES:  Lower extremities without edema or varicosities.   IMPRESSION:  1. Stage IA grade 3 endometrial adenocarcinoma in June 2003, no evidence of     recurrent disease.  2. Urge incontinence.  3. Hot flashes, night sweats.   PLAN:  Pap smears are obtained.  The patient is given a prescription for  Detrol LA to be taken one tablet daily for the next month.  If she  is not  improved, she will discontinue the use, on the other hand if her symptoms  are improved, she will continue taking it, and she is given refills.   We discussed the possibility of using Effexor, but the patient at this  juncture does not wish to take anything for her hot flashes, although she  will contact us if they are worsening.  She will return to see [place] in four  months for continuing followup.                                               De Blanch, M.D.    DC/MEDQ  D:  09/05/2003  T:  09/05/2003  Job:  [id number]   cc:   Telford Nab, R.N.  [address]  [place], [place] [address]   Gwen Pounds, M.D.  [address]  [place]  [place] [address]  Fax: [phone]   Leatha Gilding. Mezer, M.D.  [address]  [place]  [place] [address]  Fax: [phone]

## 2011-02-20 NOTE — Consult Note (Signed)
NAMETANI, VIRGO NO.:  [id number]   MEDICAL RECORD NO.:  [id number]                   PATIENT TYPE:  OUT   LOCATION:  GYN                                  FACILITY:  [facility]   PHYSICIAN:  De Blanch, M.D.         DATE OF BIRTH:  08/08/1928   DATE OF CONSULTATION:  01/16/2004  DATE OF DISCHARGE:                                   CONSULTATION   REASON FOR CONSULTATION:  A 77 year old white female returns for continuing  followup of endometrial cancer.   INTERVAL HISTORY:  Since her last visit the patient has done well. She  denies any GI or GU symptoms. She is very active working out and gardening.   HISTORY OF PRESENT ILLNESS:  The patient had underwent TAH/BSO and pelvic  and periaortic lymphadenectomy June 2003 for a stage 1A, grade 3 endometrial  cancer.  No adjuvant therapy was prescribed.   PAST MEDICAL HISTORY:  None.   PAST SURGICAL HISTORY:  D&C, tonsil and adenoidectomy, TAH/BSO, pelvic and  periaortic lymphadenectomy.   FAMILY HISTORY:  Negative for gynecologic, breast or colon cancer.   SOCIAL HISTORY:  The patient is widowed, she is very active in the community  at [employer] and works out daily. She does not smoke.   ALLERGIES:  None.   CURRENT MEDICATIONS:  Clarinex and Nasonex. (The patient took Detrol for a  month but found no improvement in her bladder function.)   REVIEW OF SYMPTOMS:  Negative except as noted above.   PHYSICAL EXAMINATION:  VITAL SIGNS:  Weight 158 pounds.  GENERAL:  The patient is a healthy white female in no acute distress.  HEENT:  Negative.  NECK:  Supple without thyromegaly. There is no supraclavicular or inguinal  adenopathy.  ABDOMEN:  Soft, nontender, no mass, organomegaly, ascites or hernias are  noted.  PELVIC:  EGBUS, vagina, bladder, urethra are normal.  The cuff is well  healed. Uterus and cervix surgically absent. Adnexa without masses.  Rectovaginal exam confirms.   IMPRESSION:  Stage 1A grade 3 endometrial carcinoma, no evidence of  recurrent disease.   PLAN:  The patient will return to see [place] in six months.  Pap smears are  repeated today.                                               De Blanch, M.D.    DC/MEDQ  D:  01/16/2004  T:  01/16/2004  Job:  [id number]   cc:   Leatha Gilding. Mezer, M.D.  [address]  [place]  [place] [address]  Fax: [phone]   Telford Nab, R.N.  [phone] [address]  [place], [place] [address]   Gwen Pounds, M.D.  [address]  [place]  [place] [address]  Fax: [phone]

## 2011-02-20 NOTE — Consult Note (Signed)
NAMERAVINDER, HOFLAND NO.:  [id number]   MEDICAL RECORD NO.:  [id number]                   PATIENT TYPE:  OUT   LOCATION:  GYN                                  FACILITY:  [facility]   PHYSICIAN:  Daniel L. Clarke-Pearson, M.D.      DATE OF BIRTH:  10/10/1927   DATE OF CONSULTATION:  DATE OF DISCHARGE:                                   CONSULTATION   HISTORY OF PRESENT ILLNESS:  This 77 year old white female returns for  continuing follow-up of a grade III endometrial carcinoma. She had a D&C in  May of 2003 showing poorly differentiated adenocarcinoma, subsequently  undergoing a total abdominal hysterectomy, bilateral salpingo-oophorectomy,  pelvic and periaortic lymphadenopathy at [facility] on  March 23, 2002. Final pathology showed no residual disease although she did  have a large endometrial polyp. Lymph nodes were free of any residual or  metastatic disease. Since her last visit, the patient has done well. She  denies any GI or GU symptoms. She has returned to full level of activity  including working out in the gym three to four times a week and swimming one  day a week. She has no complaints.   PAST MEDICAL HISTORY:  None.   PAST SURGICAL HISTORY:  D&C, tonsillectomy, adenoidectomy, total abdominal  hysterectomy, and bilateral salpingo-oophorectomy with node dissection in  2003.   FAMILY HISTORY:  Gravida II. Negative for gynecologic, breast, or colon  cancer.   SOCIAL HISTORY:  The patient is a widow. She is a previous Health and safety inspector at [employer].   ALLERGIES:  None.   CURRENT MEDICATIONS:  Clarinex and Nasonex.   REVIEW OF SYSTEMS:  Essentially negative.   PHYSICAL EXAMINATION:  VITAL SIGNS: Height 5'5. Weight 160 pounds. Blood  pressure 120/80.  GENERAL: A healthy, elderly, white female in no acute distress.  HEENT: Negative.  NECK: Supple without thyromegaly. No supraclavicular or inguinal  adenopathy.  ABDOMEN: Soft, nontender. No masses, hepatosplenomegaly, ascites or hernias  are noted. Midline incision is well healed.  PELVIC: Uterus, vagina, bladder, and urethra are normal. No lesions are  noted. There are slight atrophic changes. Cervix and uterus surgically  absent. Adnexa without masses.  RECTAL: Rectovaginal examination confirms.   IMPRESSION:  Stage 1A, grade III endometrial adenocarcinoma, status post  surgical resection and staging.   SUMMARY:  The patient is clinically free of disease and is doing well. Pap  smear is obtained.    FOLLOW UP:  The patient is to return to see Dr. Teodora Medici for checkup in  three months. To return to see [place] three months thereafter.                                               Daniel L. Clarke-Pearson, M.D.  DLC/MEDQ  D:  07/12/2002  T:  07/12/2002  Job:  [id number]   cc:   Leatha Gilding. Mezer, M.D.  [address]  [place]  [place] [address]  Fax: [phone]   Telford Nab, R.N.  [address] [place], [place] [address]  Fax: [phone]

## 2011-02-20 NOTE — Consult Note (Signed)
Charlene [name], Charlene [name] NO.:  [id number]   MEDICAL RECORD NO.:  [id number]          PATIENT TYPE:  OUT   LOCATION:  GYN                          FACILITY:  [facility]   PHYSICIAN:  De Blanch, M.D.DATE OF BIRTH:  1928-02-11   DATE OF CONSULTATION:  DATE OF DISCHARGE:                                   CONSULTATION   CHIEF COMPLAINT:  Endometrial cancer.   INTERVAL HISTORY:  Since her last visit the patient has done well.  She  denies any GI or GU symptoms, has not pelvic pain, pressure, or vaginal  discharge.  She continues to exercise regularly.  She has had no new health  problems.   HISTORY OF PRESENT ILLNESS:  The patient has stage IA grade 3 endometrial  carcinoma.  She underwent surgical resection and staging in June 2003.  She  did not receive any adjuvant therapy.   PAST MEDICAL HISTORY:  Macular degeneration.   PAST SURGICAL HISTORY:  1. D&C.  2. Tonsils and adenoidectomy.  3. TAH-BSO.  4. Pelvic and periaortic lymphadenectomy, 2003.   FAMILY HISTORY:  Negative for gynecologic, breast, or colon cancer.   SOCIAL HISTORY:  The patient is widowed.  She does not smoke.  She exercises  regularly including weight lifting and swimming.  She also volunteers at the  [employer].   DRUG ALLERGIES:  NONE.   CURRENT MEDICATIONS:  Clarinex, Nasonex, Prilosec.   REVIEW OF SYSTEMS:  A 10-point comprehensive review of systems negative  except as noted above.   PHYSICAL EXAMINATION:  VITAL SIGNS:  Weight 164 pounds, blood pressure  118/70, pulse 80, respiratory rate 20.  GENERAL:  The patient is a healthy white female in no acute distress.  HEENT:  Negative.  NECK:  Supple without thyromegaly.  There is no supraclavicular or inguinal  adenopathy.  ABDOMEN:  Soft nontender.  No mass, organomegaly, ascites, or hernias are  noted.  BREASTS:  Without masses, discharge, or skin changes.  PELVIC:  EG/BUS, vagina, bladder, urethra are normal.  Cervix  and uterus  surgically absent.  No lesions are noted.  Bimanual and rectovaginal exams  reveal no masses, induration, or nodularity.  LOWER EXTREMITIES:  Without edema or varicosities.   IMPRESSION:  Stage IA grade 3 endometrial cancer, June 2003.  No evidence of  recurrent disease.   Pap smears are obtained.  The patient will continue to have annual  mammograms.      De Blanch, M.D.  Electronically Signed     DC/MEDQ  D:  07/30/2006  T:  07/31/2006  Job:  [id number]   cc:   Telford Nab, R.N.  [address]  [place], [place] [address]   Leatha Gilding. Mezer, M.D.  Fax: [phone]

## 2011-02-20 NOTE — Consult Note (Signed)
NAMEMATTHEW, PAIS NO.:  [id number]   MEDICAL RECORD NO.:  [id number]          PATIENT TYPE:  OUT   LOCATION:  GYN                          FACILITY:  [facility]   PHYSICIAN:  De Blanch, M.D.DATE OF BIRTH:  [birth year]   DATE OF CONSULTATION:  07/22/2004  DATE OF DISCHARGE:                                   CONSULTATION   REASON FOR VISIT:  A 77 year old, white female returns for continuing follow  up of endometrial cancer.   INTERVAL HISTORY:  Since her last visit, she has done well.  She denies any  GI or GU symptoms.  She has no pelvic pain pressure, vaginal bleeding or  discharge.  Her physical status has been good.   HISTORY OF PRESENT ILLNESS:  The patient underwent surgical resection and  staging in June 2003.  Final pathology was a stage IA, grade 3, endometrial  cancer.  She received no adjuvant therapy.   PAST MEDICAL HISTORY:  None.   PAST SURGICAL HISTORY:  1.  D&C.  2.  Tonsillectomy/adenoidectomy.  3.  TAH/BSO.  4.  Pelvic and periaortic lymphadenectomy.   FAMILY HISTORY:  Negative for gynecologic, breast or colon cancer.   SOCIAL HISTORY:  The patient is widowed and does not smoke.   ALLERGIES:  No known drug allergies.   CURRENT MEDICATIONS:  Clarinex and Nasonex.   REVIEW OF SYSTEMS:  Negative, except as noted above.   PHYSICAL EXAMINATION:  VITAL SIGNS:  Weight 159 pounds, blood pressure  147/78.  GENERAL:  The patient is an elderly, white female in no acute distress.  HEENT:  Negative.  NECK:  Supple without thyromegaly.  There is no supraclavicular or inguinal  adenopathy.  ABDOMEN:  Soft, nontender, no masses, organomegaly, ascites or hernias  noted.  PELVIC:  EG/BUS.  Vagina, urethra are normal.  No lesions are noted.  Cervix  and uterus are surgically absent.  Adnexa without masses.  Rectovaginal  confirms.  EXTREMITIES:  Lower extremities without edema or varicosities.   IMPRESSION:  Stage IA, grade 3,  endometrial cancer in June 2003, with no  evidence of recurrent disease.   PLAN:  The patient will return to see [place] in 6 months.     Dani   DC/MEDQ  D:  07/22/2004  T:  07/22/2004  Job:  [id number]   cc:   Leatha Gilding. Mezer, M.D.  [address]  [place]  [place] [address]  Fax: [phone]   Gwen Pounds, MD  Fax: [phone]   Telford Nab, R.N.  [phone] [address]  [place], [place] [address]

## 2011-10-27 DIAGNOSIS — IMO0002 Reserved for concepts with insufficient information to code with codable children: Secondary | ICD-10-CM | POA: Diagnosis not present

## 2011-10-27 DIAGNOSIS — M999 Biomechanical lesion, unspecified: Secondary | ICD-10-CM | POA: Diagnosis not present

## 2011-10-27 DIAGNOSIS — IMO0001 Reserved for inherently not codable concepts without codable children: Secondary | ICD-10-CM | POA: Diagnosis not present

## 2011-10-28 DIAGNOSIS — IMO0002 Reserved for concepts with insufficient information to code with codable children: Secondary | ICD-10-CM | POA: Diagnosis not present

## 2011-10-28 DIAGNOSIS — M999 Biomechanical lesion, unspecified: Secondary | ICD-10-CM | POA: Diagnosis not present

## 2011-10-28 DIAGNOSIS — IMO0001 Reserved for inherently not codable concepts without codable children: Secondary | ICD-10-CM | POA: Diagnosis not present

## 2011-10-30 DIAGNOSIS — IMO0002 Reserved for concepts with insufficient information to code with codable children: Secondary | ICD-10-CM | POA: Diagnosis not present

## 2011-10-30 DIAGNOSIS — IMO0001 Reserved for inherently not codable concepts without codable children: Secondary | ICD-10-CM | POA: Diagnosis not present

## 2011-10-30 DIAGNOSIS — M999 Biomechanical lesion, unspecified: Secondary | ICD-10-CM | POA: Diagnosis not present

## 2011-11-02 DIAGNOSIS — IMO0001 Reserved for inherently not codable concepts without codable children: Secondary | ICD-10-CM | POA: Diagnosis not present

## 2011-11-02 DIAGNOSIS — IMO0002 Reserved for concepts with insufficient information to code with codable children: Secondary | ICD-10-CM | POA: Diagnosis not present

## 2011-11-02 DIAGNOSIS — M999 Biomechanical lesion, unspecified: Secondary | ICD-10-CM | POA: Diagnosis not present

## 2011-11-03 DIAGNOSIS — M999 Biomechanical lesion, unspecified: Secondary | ICD-10-CM | POA: Diagnosis not present

## 2011-11-03 DIAGNOSIS — IMO0001 Reserved for inherently not codable concepts without codable children: Secondary | ICD-10-CM | POA: Diagnosis not present

## 2011-11-03 DIAGNOSIS — IMO0002 Reserved for concepts with insufficient information to code with codable children: Secondary | ICD-10-CM | POA: Diagnosis not present

## 2011-11-05 DIAGNOSIS — IMO0002 Reserved for concepts with insufficient information to code with codable children: Secondary | ICD-10-CM | POA: Diagnosis not present

## 2011-11-05 DIAGNOSIS — M999 Biomechanical lesion, unspecified: Secondary | ICD-10-CM | POA: Diagnosis not present

## 2011-11-05 DIAGNOSIS — IMO0001 Reserved for inherently not codable concepts without codable children: Secondary | ICD-10-CM | POA: Diagnosis not present

## 2011-11-06 ENCOUNTER — Encounter (HOSPITAL_COMMUNITY): Payer: Self-pay | Admitting: Emergency Medicine

## 2011-11-06 ENCOUNTER — Encounter (HOSPITAL_COMMUNITY): Payer: Self-pay

## 2011-11-06 ENCOUNTER — Emergency Department (HOSPITAL_COMMUNITY): Payer: Medicare Other

## 2011-11-06 ENCOUNTER — Emergency Department (HOSPITAL_COMMUNITY)
Admission: EM | Admit: 2011-11-06 | Discharge: 2011-11-06 | Disposition: A | Discharge status: Home / Self Care | Payer: Medicare Other | Attending: Emergency Medicine | Admitting: Emergency Medicine

## 2011-11-06 ENCOUNTER — Other Ambulatory Visit: Payer: Self-pay

## 2011-11-06 ENCOUNTER — Emergency Department (INDEPENDENT_AMBULATORY_CARE_PROVIDER_SITE_OTHER)
Admission: EM | Admit: 2011-11-06 | Discharge: 2011-11-06 | Disposition: A | Discharge status: Nursing Home (Includes ICF) | Payer: Medicare Other | Source: Home / Self Care | Attending: Family Medicine | Admitting: Family Medicine

## 2011-11-06 DIAGNOSIS — I440 Atrioventricular block, first degree: Secondary | ICD-10-CM | POA: Diagnosis not present

## 2011-11-06 DIAGNOSIS — R1013 Epigastric pain: Secondary | ICD-10-CM | POA: Diagnosis not present

## 2011-11-06 DIAGNOSIS — R61 Generalized hyperhidrosis: Secondary | ICD-10-CM | POA: Diagnosis not present

## 2011-11-06 DIAGNOSIS — R51 Headache: Secondary | ICD-10-CM | POA: Diagnosis not present

## 2011-11-06 DIAGNOSIS — R6884 Jaw pain: Secondary | ICD-10-CM | POA: Insufficient documentation

## 2011-11-06 DIAGNOSIS — R0789 Other chest pain: Secondary | ICD-10-CM | POA: Diagnosis not present

## 2011-11-06 DIAGNOSIS — J984 Other disorders of lung: Secondary | ICD-10-CM | POA: Diagnosis not present

## 2011-11-06 DIAGNOSIS — H9209 Otalgia, unspecified ear: Secondary | ICD-10-CM | POA: Insufficient documentation

## 2011-11-06 DIAGNOSIS — R112 Nausea with vomiting, unspecified: Secondary | ICD-10-CM | POA: Insufficient documentation

## 2011-11-06 HISTORY — DX: Unspecified macular degeneration: H35.30

## 2011-11-06 LAB — POCT I-STAT, CHEM 8
BUN: 20 mg/dL (ref 6–23)
Calcium, Ion: 1.32 mmol/L (ref 1.12–1.32)
Chloride: 106 mEq/L (ref 96–112)
Glucose, Bld: 85 mg/dL (ref 70–99)
HCT: 42 % (ref 36.0–46.0)
TCO2: 25 mmol/L (ref 0–100)

## 2011-11-06 LAB — CBC
HCT: 41.5 % (ref 36.0–46.0)
MCH: 30.3 pg (ref 26.0–34.0)
MCHC: 33.3 g/dL (ref 30.0–36.0)
MCV: 91 fL (ref 78.0–100.0)
Platelets: 244 10*3/uL (ref 150–400)
RDW: 13.4 % (ref 11.5–15.5)
WBC: 10.4 10*3/uL (ref 4.0–10.5)

## 2011-11-06 LAB — HEPATIC FUNCTION PANEL
ALT: 16 U/L (ref 0–35)
AST: 26 U/L (ref 0–37)
Total Protein: 7.1 g/dL (ref 6.0–8.3)

## 2011-11-06 LAB — DIFFERENTIAL
Basophils Absolute: 0.1 10*3/uL (ref 0.0–0.1)
Eosinophils Absolute: 0.3 10*3/uL (ref 0.0–0.7)
Eosinophils Relative: 2 % (ref 0–5)
Lymphocytes Relative: 19 % (ref 12–46)
Monocytes Absolute: 0.5 10*3/uL (ref 0.1–1.0)

## 2011-11-06 LAB — URINALYSIS, ROUTINE W REFLEX MICROSCOPIC
Bilirubin Urine: NEGATIVE
Ketones, ur: NEGATIVE mg/dL
Nitrite: NEGATIVE
Protein, ur: NEGATIVE mg/dL
pH: 5 (ref 5.0–8.0)

## 2011-11-06 LAB — CARDIAC PANEL(CRET KIN+CKTOT+MB+TROPI): Relative Index: 2.3 (ref 0.0–2.5)

## 2011-11-06 MED ORDER — ASPIRIN 81 MG PO CHEW
243.0000 mg | CHEWABLE_TABLET | Freq: Once | ORAL | Status: AC
  Administered 2011-11-06: 243 mg via ORAL
  Filled 2011-11-06: qty 3

## 2011-11-06 NOTE — ED Notes (Signed)
Had pressure  Under breast  Thought she ahd gas  Rt ear hurt  Pressure gone now and her bp was high

## 2011-11-06 NOTE — ED Notes (Signed)
States earlier today, she developed pressure sub sternal and epigastric/diaphragmatic area that felt a lot  like gas, but did not improve w gas remedy . Thought she might need to have a BM, but this did not give her any relief . Before the discomfort finally went away, she had strange sensation in right side of face, felt dizzy and nauseated. Did not vomit, and states her home BP machine read higher than usual. She stated she did want to call her MD, because he would only send her to the ED and she didn't want that

## 2011-11-06 NOTE — ED Provider Notes (Signed)
History     CSN: [id number]  Arrival date & time 11/06/11  1347   First MD Initiated Contact with Patient 11/06/11 1554      No chief complaint on file.   (Consider location/radiation/quality/duration/timing/severity/associated sxs/prior treatment) HPI  83yoF is a healthy [name] with abdominal pressure. Patient states approximately 10:30 this morning Charlene [name] exhibits 10 minutes of dull epigastric pressure without radiation. Charlene [name] was dusting at this time. States Charlene [name] took a gas ex prior to resolution. Charlene [name] does state that Charlene [name] had right ear and right cheek pain at that time but denies pain in Charlene [name] neck. Charlene [name] denies numbness, tingling, weakness of Charlene [name] extremities. There's been no fever, chills, cough. Charlene [name] denies shortness of breath. No history of similar. Has nausea, vomiting, diaphoresis. There's been no constipation, diarrhea, blood in Charlene [name] stool. The patient was seen at urgent care where Charlene [name] had a nonischemic EKG according to their note and Charlene [name] was sent here for further evaluation and workup. Charlene [name] remains asymptomatic at this time. Charlene [name] states that Charlene [name] did take one baby aspirin this morning. Denies history of hypertension, hyperlipidemia, diabetes. Former smoker.   ED Notes, ED Provider Notes from 11/06/11 0000 to 11/06/11 14:07:15       Darrin Nipper Roughgarden, RN 11/06/2011 14:05      Had pressure Under breast Thought Charlene [name] ahd gas Rt ear hurt Pressure gone now and Charlene [name] bp was high   Richardo Priest, MD 11/06/2011 13:41  History    CSN: [id number]  Arrival date & time 11/06/11 1234  First MD Initiated Contact with Patient 11/06/11 1303  Chief Complaint   [name] with   .  Abdominal Pain    (Consider location/radiation/quality/duration/timing/severity/associated sxs/prior  treatment)  HPI Comments: Charlene [name] for evaluation of sudden onset of epigastric discomfort that started this morning while Charlene [name] was cleaning Charlene [name] house, dusting. Charlene [name] also reports RIGHT ear and jaw pain at that time, with mild  nausea, and lightheadedness. Charlene [name] thought it was gas, and took a gas pill. Then Charlene [name] thought Charlene [name] needed to have a bowel movement. Charlene [name] did, but again, had no improvement. Charlene [name] denies any vision changes, or diaphoresis. Charlene [name] denies any hx of HTN, CAD, or known heart defects. Charlene [name] does not smoke, and is otherwise healthy. Charlene [name] takes no medication on a daily basis. Charlene [name] reports a similar episode to this several years ago, that woke Charlene [name] from sleep but Charlene [name] did not seek care at that time. ECG at [facility] shows sinus bradycardia, rate 56, with no ST T wave changes.     Past Medical History  Diagnosis Date  . Macular degeneration     Past Surgical History  Procedure Date  . Abdominal hysterectomy     No family history on file.  History  Substance Use Topics  . Smoking status: Never Smoker   . Smokeless tobacco: Not on file  . Alcohol Use: No    OB History    Grav Para Term Preterm Abortions TAB SAB Ect Mult Living                  Review of Systems  All other systems reviewed and are negative.   except as noted HPI  Allergies  Review of patient's allergies indicates no known allergies.  Home Medications   Current Outpatient Rx  Name Route Sig Dispense Refill  . OCUVITE PO TABS Oral Take 1 tablet by mouth daily.    Marland Kitchen LORATADINE 10 MG PO TABS Oral Take 10 mg by mouth daily.    Marland Kitchen  PROBIOTIC FORMULA PO Oral Take 1 capsule by mouth daily.      BP 116/93  Pulse 64  Temp(Src) 98.2 F (36.8 C) (Oral)  Resp 18  SpO2 100%  Physical Exam  Nursing note and vitals reviewed. Constitutional: Charlene [name] is oriented to person, place, and time. Charlene [name] appears well-developed.  HENT:  Head: Atraumatic.  Mouth/Throat: Oropharynx is clear and moist.  Eyes: Conjunctivae and EOM are normal. Pupils are equal, round, and reactive to light.  Neck: Normal range of motion. Neck supple.  Cardiovascular: Normal rate, regular rhythm, normal heart sounds and intact distal pulses.   Pulmonary/Chest: Effort normal and  breath sounds normal. No respiratory distress. Charlene [name] has no wheezes. Charlene [name] has no rales.  Abdominal: Soft. Charlene [name] exhibits no distension. There is no tenderness. There is no rebound and no guarding.  Musculoskeletal: Normal range of motion. Charlene [name] exhibits no edema and no tenderness.  Neurological: Charlene [name] is alert and oriented to person, place, and time.  Skin: Skin is warm and dry. No rash noted.  Psychiatric: Charlene [name] has a normal mood and affect.    Date: 11/06/2011  Rate: 59  Rhythm: normal sinus rhythm  QRS Axis: normal  Intervals: normal  ST/T Wave abnormalities: normal  Conduction Disutrbances:first-degree A-V block   Narrative Interpretation: borderline AV conduction delay  Old EKG Reviewed: none available per ED staff  ED Course  Procedures (including critical care time)   Labs Reviewed  CBC  DIFFERENTIAL  URINALYSIS, ROUTINE W REFLEX MICROSCOPIC  POCT I-STAT, CHEM 8  CARDIAC PANEL(CRET KIN+CKTOT+MB+TROPI)  HEPATIC FUNCTION PANEL   Dg Chest 2 View  11/06/2011  *RADIOLOGY REPORT*  Clinical Data: Chest tightness.  CHEST - 2 VIEW  Comparison: None.  Findings: Cardiopericardial silhouette is upper limits of normal for projection.  There is patchy density in the lower lobes on the lateral view.  This is probably within medial right lower lobe on the frontal view.  This may represent atelectasis or airspace disease.  No comparison is available to assess for chronicity. Mediastinal contours appear normal. Thoracic spondylosis is mild.  IMPRESSION: Lower lobe patchy density, likely right lower lobe, may represent airspace disease or atelectasis.  Original Report Authenticated By: Andreas Newport, M.D.     No diagnosis found.   MDM  Charlene [name] [name] with 10 minutes of transient epigastric pain which resolved after Gas-X. Differential diagnosis includes acute coronary syndrome, gastritis, bloating, less likely pulmonary embolism, dissection, pneumonia. Will order chest x-ray, EKG, labs. The patient has  been given aspirin. Will reassess.  5:51 PM  Patient continues to remain asymptomatic. Charlene [name] labs have been reviewed and are unremarkable. CXR with atelectasis vs airspace disease. Pt without fever/chills/cough/sob. Charlene [name] sx would be highly atypical for pna. Discussed the possibility of admission versus CDU observation with the patient. Charlene [name] is alert, oriented and fully competent and is refusing both. I have discussed extensively the risks of leaving and Charlene [name] understands risks up to and including death. Charlene [name] son is present. Will not sign Charlene [name] out AMA. Discussed PMD f/u within 2 days-- Charlene [name] has an appt in 2 weeks which Charlene [name] has been advised to move forward. Given strict precautions for return and advised to take asa daily until Charlene [name] can be seen by Charlene [name] PMD.        Forbes Cellar, MD 11/06/11 1755

## 2011-11-06 NOTE — ED Notes (Signed)
Gave EKG to Dr. Brooke Dare

## 2011-11-06 NOTE — ED Notes (Signed)
Patient states that earlier today she had some right upper quadrant pressure that felt like it was radiating into her jaw. She took some gas x but had no relief so was instructed to come in for further evaluation. Pt states that her blood pressure was also elevated when she took it at home. Pt is alert and oriented. Breath sounds are clear and bowel sounds are present. Pt denies any n/v/d.

## 2011-11-06 NOTE — ED Provider Notes (Signed)
History     CSN: [id number]  Arrival date & time 11/06/11  1234   First MD Initiated Contact with Patient 11/06/11 1303      Chief Complaint  Patient presents with  . Abdominal Pain    (Consider location/radiation/quality/duration/timing/severity/associated sxs/prior treatment) HPI Comments: Charlene [name] presents for evaluation of sudden onset of epigastric discomfort that started this morning while she was cleaning her house, dusting. She also reports RIGHT ear and jaw pain at that time, with mild nausea, and lightheadedness. She thought it was gas, and took a gas pill. Then she thought she needed to have a bowel movement. She did, but again, had no improvement. She denies any vision changes, or diaphoresis. She denies any hx of HTN, CAD, or known heart defects. She does not smoke, and is otherwise healthy. She takes no medication on a daily basis. She reports a similar episode to this several years ago, that woke her from sleep but she did not seek care at that time. ECG at [facility] shows sinus bradycardia, rate 56, with no ST T wave changes.   Patient is a 78 y.o. female presenting with abdominal pain. The history is provided by the patient.  Abdominal Pain The primary symptoms of the illness include abdominal pain, fatigue and nausea. The primary symptoms of the illness do not include shortness of breath, vomiting or diarrhea. The current episode started 3 to 5 hours ago.  The abdominal pain began 3 to 5 hours ago. The pain came on suddenly. The abdominal pain has been resolved since its onset. The abdominal pain is located in the epigastric region. The abdominal pain does not radiate. The abdominal pain is relieved by nothing. The abdominal pain is exacerbated by vomiting.  Nausea began today. The nausea is exacerbated by activity.    History reviewed. No pertinent past medical history.  History reviewed. No pertinent past surgical history.  History reviewed. No pertinent family  history.  History  Substance Use Topics  . Smoking status: Not on file  . Smokeless tobacco: Not on file  . Alcohol Use: Not on file    OB History    Grav Para Term Preterm Abortions TAB SAB Ect Mult Living                  Review of Systems  Constitutional: Positive for fatigue.  HENT: Positive for ear pain.   Eyes: Negative.   Respiratory: Positive for chest tightness. Negative for shortness of breath.   Cardiovascular: Negative.   Gastrointestinal: Positive for nausea and abdominal pain. Negative for vomiting and diarrhea.  Genitourinary: Negative.   Musculoskeletal: Negative.   Skin: Negative.   Neurological: Positive for light-headedness.    Allergies  Review of patient's allergies indicates no known allergies.  Home Medications   Current Outpatient Rx  Name Route Sig Dispense Refill  . LORATADINE 10 MG PO TABS Oral Take 10 mg by mouth daily.      BP 157/77  Pulse 66  Temp(Src) 98.7 F (37.1 C) (Oral)  Resp 16  SpO2 96%  Physical Exam  Nursing note and vitals reviewed. Constitutional: She is oriented to person, place, and time. She appears well-developed and well-nourished.  HENT:  Head: Normocephalic and atraumatic.  Right Ear: Tympanic membrane is retracted.  Left Ear: Tympanic membrane is retracted.  Mouth/Throat: Uvula is midline, oropharynx is clear and moist and mucous membranes are normal.  Eyes: Conjunctivae, EOM and lids are normal. Pupils are equal, round, and reactive to light.  Neck:  Normal range of motion.  Cardiovascular: Regular rhythm.  Bradycardia present.   Murmur heard.  Systolic murmur is present with a grade of 2/6  Pulmonary/Chest: Effort normal and breath sounds normal. She has no wheezes. She has no rhonchi.  Musculoskeletal: Normal range of motion.  Neurological: She is alert and oriented to person, place, and time.  Skin: Skin is warm and dry.  Psychiatric: Her behavior is normal.    ED Course  Procedures (including  critical care time)  Labs Reviewed - No data to display No results found.   1. Epigastric pain       MDM  Transferred to ED for further evaluation        Richardo Priest, MD 11/06/11 1341

## 2011-11-09 DIAGNOSIS — IMO0002 Reserved for concepts with insufficient information to code with codable children: Secondary | ICD-10-CM | POA: Diagnosis not present

## 2011-11-09 DIAGNOSIS — IMO0001 Reserved for inherently not codable concepts without codable children: Secondary | ICD-10-CM | POA: Diagnosis not present

## 2011-11-09 DIAGNOSIS — M999 Biomechanical lesion, unspecified: Secondary | ICD-10-CM | POA: Diagnosis not present

## 2011-11-10 DIAGNOSIS — M999 Biomechanical lesion, unspecified: Secondary | ICD-10-CM | POA: Diagnosis not present

## 2011-11-10 DIAGNOSIS — IMO0002 Reserved for concepts with insufficient information to code with codable children: Secondary | ICD-10-CM | POA: Diagnosis not present

## 2011-11-10 DIAGNOSIS — IMO0001 Reserved for inherently not codable concepts without codable children: Secondary | ICD-10-CM | POA: Diagnosis not present

## 2011-11-11 DIAGNOSIS — Z0000 Encounter for general adult medical examination without abnormal findings: Secondary | ICD-10-CM | POA: Diagnosis not present

## 2011-11-11 DIAGNOSIS — E7800 Pure hypercholesterolemia, unspecified: Secondary | ICD-10-CM | POA: Diagnosis not present

## 2011-11-11 DIAGNOSIS — E559 Vitamin D deficiency, unspecified: Secondary | ICD-10-CM | POA: Diagnosis not present

## 2011-11-11 DIAGNOSIS — N3941 Urge incontinence: Secondary | ICD-10-CM | POA: Diagnosis not present

## 2011-11-12 DIAGNOSIS — M999 Biomechanical lesion, unspecified: Secondary | ICD-10-CM | POA: Diagnosis not present

## 2011-11-12 DIAGNOSIS — IMO0001 Reserved for inherently not codable concepts without codable children: Secondary | ICD-10-CM | POA: Diagnosis not present

## 2011-11-12 DIAGNOSIS — IMO0002 Reserved for concepts with insufficient information to code with codable children: Secondary | ICD-10-CM | POA: Diagnosis not present

## 2011-11-16 DIAGNOSIS — IMO0002 Reserved for concepts with insufficient information to code with codable children: Secondary | ICD-10-CM | POA: Diagnosis not present

## 2011-11-16 DIAGNOSIS — M999 Biomechanical lesion, unspecified: Secondary | ICD-10-CM | POA: Diagnosis not present

## 2011-11-16 DIAGNOSIS — IMO0001 Reserved for inherently not codable concepts without codable children: Secondary | ICD-10-CM | POA: Diagnosis not present

## 2011-11-17 DIAGNOSIS — IMO0002 Reserved for concepts with insufficient information to code with codable children: Secondary | ICD-10-CM | POA: Diagnosis not present

## 2011-11-17 DIAGNOSIS — M999 Biomechanical lesion, unspecified: Secondary | ICD-10-CM | POA: Diagnosis not present

## 2011-11-17 DIAGNOSIS — IMO0001 Reserved for inherently not codable concepts without codable children: Secondary | ICD-10-CM | POA: Diagnosis not present

## 2011-11-18 DIAGNOSIS — Z0000 Encounter for general adult medical examination without abnormal findings: Secondary | ICD-10-CM | POA: Diagnosis not present

## 2011-11-18 DIAGNOSIS — H3530 Unspecified macular degeneration: Secondary | ICD-10-CM | POA: Diagnosis not present

## 2011-11-18 DIAGNOSIS — M949 Disorder of cartilage, unspecified: Secondary | ICD-10-CM | POA: Diagnosis not present

## 2011-11-18 DIAGNOSIS — E781 Pure hyperglyceridemia: Secondary | ICD-10-CM | POA: Diagnosis not present

## 2011-11-18 DIAGNOSIS — M899 Disorder of bone, unspecified: Secondary | ICD-10-CM | POA: Diagnosis not present

## 2011-11-19 DIAGNOSIS — M999 Biomechanical lesion, unspecified: Secondary | ICD-10-CM | POA: Diagnosis not present

## 2011-11-19 DIAGNOSIS — IMO0002 Reserved for concepts with insufficient information to code with codable children: Secondary | ICD-10-CM | POA: Diagnosis not present

## 2011-11-19 DIAGNOSIS — IMO0001 Reserved for inherently not codable concepts without codable children: Secondary | ICD-10-CM | POA: Diagnosis not present

## 2011-11-23 DIAGNOSIS — M999 Biomechanical lesion, unspecified: Secondary | ICD-10-CM | POA: Diagnosis not present

## 2011-11-23 DIAGNOSIS — IMO0001 Reserved for inherently not codable concepts without codable children: Secondary | ICD-10-CM | POA: Diagnosis not present

## 2011-11-23 DIAGNOSIS — IMO0002 Reserved for concepts with insufficient information to code with codable children: Secondary | ICD-10-CM | POA: Diagnosis not present

## 2011-11-25 DIAGNOSIS — M999 Biomechanical lesion, unspecified: Secondary | ICD-10-CM | POA: Diagnosis not present

## 2011-11-25 DIAGNOSIS — IMO0001 Reserved for inherently not codable concepts without codable children: Secondary | ICD-10-CM | POA: Diagnosis not present

## 2011-11-25 DIAGNOSIS — IMO0002 Reserved for concepts with insufficient information to code with codable children: Secondary | ICD-10-CM | POA: Diagnosis not present

## 2011-11-26 DIAGNOSIS — IMO0002 Reserved for concepts with insufficient information to code with codable children: Secondary | ICD-10-CM | POA: Diagnosis not present

## 2011-11-26 DIAGNOSIS — IMO0001 Reserved for inherently not codable concepts without codable children: Secondary | ICD-10-CM | POA: Diagnosis not present

## 2011-11-26 DIAGNOSIS — M999 Biomechanical lesion, unspecified: Secondary | ICD-10-CM | POA: Diagnosis not present

## 2011-11-30 DIAGNOSIS — M999 Biomechanical lesion, unspecified: Secondary | ICD-10-CM | POA: Diagnosis not present

## 2011-11-30 DIAGNOSIS — IMO0002 Reserved for concepts with insufficient information to code with codable children: Secondary | ICD-10-CM | POA: Diagnosis not present

## 2011-11-30 DIAGNOSIS — IMO0001 Reserved for inherently not codable concepts without codable children: Secondary | ICD-10-CM | POA: Diagnosis not present

## 2011-12-03 DIAGNOSIS — M999 Biomechanical lesion, unspecified: Secondary | ICD-10-CM | POA: Diagnosis not present

## 2011-12-03 DIAGNOSIS — IMO0002 Reserved for concepts with insufficient information to code with codable children: Secondary | ICD-10-CM | POA: Diagnosis not present

## 2011-12-03 DIAGNOSIS — IMO0001 Reserved for inherently not codable concepts without codable children: Secondary | ICD-10-CM | POA: Diagnosis not present

## 2011-12-10 DIAGNOSIS — IMO0002 Reserved for concepts with insufficient information to code with codable children: Secondary | ICD-10-CM | POA: Diagnosis not present

## 2011-12-10 DIAGNOSIS — M999 Biomechanical lesion, unspecified: Secondary | ICD-10-CM | POA: Diagnosis not present

## 2011-12-10 DIAGNOSIS — IMO0001 Reserved for inherently not codable concepts without codable children: Secondary | ICD-10-CM | POA: Diagnosis not present

## 2011-12-14 DIAGNOSIS — E505 Vitamin A deficiency with night blindness: Secondary | ICD-10-CM | POA: Diagnosis not present

## 2011-12-14 DIAGNOSIS — H35369 Drusen (degenerative) of macula, unspecified eye: Secondary | ICD-10-CM | POA: Diagnosis not present

## 2011-12-14 DIAGNOSIS — H43819 Vitreous degeneration, unspecified eye: Secondary | ICD-10-CM | POA: Diagnosis not present

## 2011-12-14 DIAGNOSIS — H353190 Nonexudative age-related macular degeneration, unspecified eye, stage unspecified: Secondary | ICD-10-CM | POA: Diagnosis not present

## 2011-12-17 DIAGNOSIS — IMO0001 Reserved for inherently not codable concepts without codable children: Secondary | ICD-10-CM | POA: Diagnosis not present

## 2011-12-17 DIAGNOSIS — IMO0002 Reserved for concepts with insufficient information to code with codable children: Secondary | ICD-10-CM | POA: Diagnosis not present

## 2011-12-17 DIAGNOSIS — M999 Biomechanical lesion, unspecified: Secondary | ICD-10-CM | POA: Diagnosis not present

## 2011-12-31 DIAGNOSIS — M25559 Pain in unspecified hip: Secondary | ICD-10-CM | POA: Diagnosis not present

## 2011-12-31 DIAGNOSIS — M5137 Other intervertebral disc degeneration, lumbosacral region: Secondary | ICD-10-CM | POA: Diagnosis not present

## 2011-12-31 DIAGNOSIS — M999 Biomechanical lesion, unspecified: Secondary | ICD-10-CM | POA: Diagnosis not present

## 2012-01-11 DIAGNOSIS — H5231 Anisometropia: Secondary | ICD-10-CM | POA: Diagnosis not present

## 2012-01-11 DIAGNOSIS — H25019 Cortical age-related cataract, unspecified eye: Secondary | ICD-10-CM | POA: Diagnosis not present

## 2012-01-20 DIAGNOSIS — M949 Disorder of cartilage, unspecified: Secondary | ICD-10-CM | POA: Diagnosis not present

## 2012-01-20 DIAGNOSIS — M899 Disorder of bone, unspecified: Secondary | ICD-10-CM | POA: Diagnosis not present

## 2012-02-04 DIAGNOSIS — IMO0002 Reserved for concepts with insufficient information to code with codable children: Secondary | ICD-10-CM | POA: Diagnosis not present

## 2012-02-04 DIAGNOSIS — H25019 Cortical age-related cataract, unspecified eye: Secondary | ICD-10-CM | POA: Diagnosis not present

## 2012-03-17 DIAGNOSIS — H43819 Vitreous degeneration, unspecified eye: Secondary | ICD-10-CM | POA: Diagnosis not present

## 2012-03-17 DIAGNOSIS — H3560 Retinal hemorrhage, unspecified eye: Secondary | ICD-10-CM | POA: Diagnosis not present

## 2012-03-17 DIAGNOSIS — E505 Vitamin A deficiency with night blindness: Secondary | ICD-10-CM | POA: Diagnosis not present

## 2012-03-17 DIAGNOSIS — H353190 Nonexudative age-related macular degeneration, unspecified eye, stage unspecified: Secondary | ICD-10-CM | POA: Diagnosis not present

## 2012-03-23 DIAGNOSIS — H3560 Retinal hemorrhage, unspecified eye: Secondary | ICD-10-CM | POA: Diagnosis not present

## 2012-03-23 DIAGNOSIS — H353290 Exudative age-related macular degeneration, unspecified eye, stage unspecified: Secondary | ICD-10-CM | POA: Diagnosis not present

## 2012-04-16 ENCOUNTER — Emergency Department (HOSPITAL_COMMUNITY)
Admission: EM | Admit: 2012-04-16 | Discharge: 2012-04-16 | Disposition: A | Discharge status: Home / Self Care | Payer: Medicare Other | Attending: Emergency Medicine | Admitting: Emergency Medicine

## 2012-04-16 ENCOUNTER — Emergency Department (HOSPITAL_COMMUNITY): Payer: Medicare Other

## 2012-04-16 ENCOUNTER — Encounter (HOSPITAL_COMMUNITY): Payer: Self-pay

## 2012-04-16 DIAGNOSIS — K8020 Calculus of gallbladder without cholecystitis without obstruction: Secondary | ICD-10-CM | POA: Insufficient documentation

## 2012-04-16 DIAGNOSIS — M549 Dorsalgia, unspecified: Secondary | ICD-10-CM | POA: Diagnosis not present

## 2012-04-16 DIAGNOSIS — R112 Nausea with vomiting, unspecified: Secondary | ICD-10-CM | POA: Diagnosis not present

## 2012-04-16 DIAGNOSIS — R52 Pain, unspecified: Secondary | ICD-10-CM | POA: Diagnosis not present

## 2012-04-16 DIAGNOSIS — R9431 Abnormal electrocardiogram [ECG] [EKG]: Secondary | ICD-10-CM | POA: Diagnosis not present

## 2012-04-16 DIAGNOSIS — Z79899 Other long term (current) drug therapy: Secondary | ICD-10-CM | POA: Diagnosis not present

## 2012-04-16 DIAGNOSIS — R109 Unspecified abdominal pain: Secondary | ICD-10-CM | POA: Diagnosis not present

## 2012-04-16 DIAGNOSIS — K8050 Calculus of bile duct without cholangitis or cholecystitis without obstruction: Secondary | ICD-10-CM

## 2012-04-16 LAB — COMPREHENSIVE METABOLIC PANEL
BUN: 15 mg/dL (ref 6–23)
CO2: 26 mEq/L (ref 19–32)
Calcium: 9.5 mg/dL (ref 8.4–10.5)
Creatinine, Ser: 0.96 mg/dL (ref 0.50–1.10)
GFR calc Af Amer: 63 mL/min — ABNORMAL LOW (ref >90)
GFR calc non Af Amer: 54 mL/min — ABNORMAL LOW (ref >90)
Glucose, Bld: 95 mg/dL (ref 70–99)

## 2012-04-16 LAB — URINALYSIS, ROUTINE W REFLEX MICROSCOPIC
Bilirubin Urine: NEGATIVE
Glucose, UA: NEGATIVE mg/dL
Hgb urine dipstick: NEGATIVE
Ketones, ur: NEGATIVE mg/dL
Leukocytes, UA: NEGATIVE
Nitrite: NEGATIVE
Protein, ur: NEGATIVE mg/dL
Specific Gravity, Urine: 1.013 (ref 1.005–1.030)
Urobilinogen, UA: 0.2 mg/dL (ref 0.0–1.0)
pH: 7 (ref 5.0–8.0)

## 2012-04-16 LAB — COMPREHENSIVE METABOLIC PANEL WITH GFR
ALT: 25 U/L (ref 0–35)
AST: 50 U/L — ABNORMAL HIGH (ref 0–37)
Albumin: 3.5 g/dL (ref 3.5–5.2)
Alkaline Phosphatase: 66 U/L (ref 39–117)
Chloride: 103 meq/L (ref 96–112)
Potassium: 4.1 meq/L (ref 3.5–5.1)
Sodium: 138 meq/L (ref 135–145)
Total Bilirubin: 0.3 mg/dL (ref 0.3–1.2)
Total Protein: 6.5 g/dL (ref 6.0–8.3)

## 2012-04-16 LAB — CBC WITH DIFFERENTIAL/PLATELET
Basophils Absolute: 0 K/uL (ref 0.0–0.1)
Basophils Relative: 0 % (ref 0–1)
Eosinophils Absolute: 0.2 K/uL (ref 0.0–0.7)
Eosinophils Relative: 2 % (ref 0–5)
HCT: 37.4 % (ref 36.0–46.0)
Hemoglobin: 12.7 g/dL (ref 12.0–15.0)
Lymphocytes Relative: 13 % (ref 12–46)
Lymphs Abs: 1.5 K/uL (ref 0.7–4.0)
MCH: 31.1 pg (ref 26.0–34.0)
MCHC: 34 g/dL (ref 30.0–36.0)
MCV: 91.7 fL (ref 78.0–100.0)
Monocytes Absolute: 0.7 K/uL (ref 0.1–1.0)
Monocytes Relative: 6 % (ref 3–12)
Neutro Abs: 9.1 10*3/uL — ABNORMAL HIGH (ref 1.7–7.7)
Neutrophils Relative %: 79 % — ABNORMAL HIGH (ref 43–77)
Platelets: 215 K/uL (ref 150–400)
RBC: 4.08 MIL/uL (ref 3.87–5.11)
RDW: 13.2 % (ref 11.5–15.5)
WBC: 11.5 10*3/uL — ABNORMAL HIGH (ref 4.0–10.5)

## 2012-04-16 LAB — LIPASE, BLOOD: Lipase: 54 U/L (ref 11–59)

## 2012-04-16 LAB — TROPONIN I: Troponin I: <0.3 ng/mL (ref <0.30)

## 2012-04-16 MED ORDER — SODIUM CHLORIDE 0.9 % IV SOLN: Freq: Once | INTRAVENOUS | Status: DC

## 2012-04-16 MED ORDER — ONDANSETRON 8 MG PO TBDP: 8.0000 mg | ORAL_TABLET | Freq: Two times a day (BID) | ORAL | Status: AC | PRN

## 2012-04-16 MED ORDER — HYDROCODONE-ACETAMINOPHEN 5-325 MG PO TABS: ORAL_TABLET | ORAL | Status: AC

## 2012-04-16 NOTE — ED Notes (Signed)
Per EMS, pt c/o upper abd pain more to right side with nausea and radiation of pain into side of back. Had some vertigo on Wednesday, Thursday and Friday, none today. Given NTG x 1 and 324 mg ASA with pain subsiding some in abd. Pt did have emesis x 1. 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 VSS, 1st degree block on 12 lead. 20g LAC

## 2012-04-16 NOTE — Discharge Instructions (Signed)
 Biliary Colic    Biliary colic is a steady or irregular pain in the upper abdomen. It is usually under the right side of the rib cage. It happens when gallstones interfere with the normal flow of bile from the gallbladder. Bile is a liquid that helps to digest fats. Bile is made in the liver and stored in the gallbladder. When you eat a meal, bile passes from the gallbladder through the cystic duct and the common bile duct into the small intestine. There, it mixes with partially digested food. If a gallstone blocks either of these ducts, the normal flow of bile is blocked. The muscle cells in the bile duct contract forcefully to try to move the stone. This causes the pain of biliary colic.    SYMPTOMS     A person with biliary colic usually complains of pain in the upper abdomen. This pain can be:    In the center of the upper abdomen just below the breastbone.    In the upper-right part of the abdomen, near the gallbladder and liver.    Spread back toward the right shoulder blade.    Nausea and vomiting.    The pain usually occurs after eating.    Biliary colic is usually triggered by the digestive system's demand for bile. The demand for bile is high after fatty meals. Symptoms can also occur when a person who has been fasting suddenly eats a very large meal. Most episodes of biliary colic pass after 1 to 5 hours. After the most intense pain passes, your abdomen may continue to ache mildly for about 24 hours.   DIAGNOSIS    After you describe your symptoms, your caregiver will perform a physical exam. He or she will pay attention to the upper right portion of your belly (abdomen). This is the area of your liver and gallbladder. An ultrasound will help your caregiver look for gallstones. Specialized scans of the gallbladder may also be done. Blood tests may be done, especially if you have fever or if your pain persists.  PREVENTION     Biliary colic can be prevented by controlling the risk factors for gallstones. Some of these risk factors, such as heredity, increasing age, and pregnancy are a normal part of life. Obesity and a high-fat diet are risk factors you can change through a healthy lifestyle. Women going through menopause who take hormone replacement therapy (estrogen) are also more likely to develop biliary colic.  TREATMENT     Pain medication may be prescribed.    You may be encouraged to eat a fat-free diet.    If the first episode of biliary colic is severe, or episodes of colic keep retuning, surgery to remove the gallbladder (cholecystectomy) is usually recommended. This procedure can be done through small incisions using an instrument called a laparoscope. The procedure often requires a brief stay in the hospital. Some people can leave the hospital the same day. It is the most widely used treatment in people troubled by painful gallstones. It is effective and safe, with no complications in more than 90% of cases.    If surgery cannot be done, medication that dissolves gallstones may be used. This medication is expensive and can take months or years to work. Only small stones will dissolve.    Rarely, medication to dissolve gallstones is combined with a procedure called shock-wave lithotripsy. This procedure uses carefully aimed shock waves to break up gallstones. In many people treated with this procedure, gallstones form again  within a few years.   PROGNOSIS    If gallstones block your cystic duct or common bile duct, you are at risk for repeated episodes of biliary colic. There is also a 25% chance that you will develop a gallbladder infection(acute cholecystitis), or some other complication of gallstones within 10 to 20 years. If you have surgery, schedule it at a time that is convenient for you and at a time when you are not sick.  HOME CARE INSTRUCTIONS     Drink plenty of clear fluids.     Avoid fatty, greasy or fried foods, or any foods that make your pain worse.    Take medications as directed.   SEEK MEDICAL CARE IF:     You develop a fever over 100.5 F (38.1 C).    Your pain gets worse over time.    You develop nausea that prevents you from eating and drinking.    You develop vomiting.   SEEK IMMEDIATE MEDICAL CARE IF:     You have continuous or severe belly (abdominal) pain which is not relieved with medications.    You develop nausea and vomiting which is not relieved with medications.    You have symptoms of biliary colic and you suddenly develop a fever and shaking chills. This may signal cholecystitis. Call your caregiver immediately.    You develop a yellow color to your skin or the white part of your eyes (jaundice).   Document Released: 02/22/2006 Document Revised: 09/10/2011 Document Reviewed: 05/03/2008  [facility] Patient Information 2012 [place], [place].

## 2012-04-16 NOTE — ED Notes (Signed)
Patient transported to Ultrasound 

## 2012-04-16 NOTE — ED Provider Notes (Signed)
History     CSN: [id number]  Arrival date & time 04/16/12  1325   First MD Initiated Contact with Patient 04/16/12 1329      Chief Complaint  Patient presents with  . Abdominal Pain    epigastric area    (Consider location/radiation/quality/duration/timing/severity/associated sxs/prior treatment) HPI Comments: Pt reports pain began around 12 noon, had eaten breakfast previously, not had lunch yet, had rather sudden onset of right sided upper abd pain that radiated around to right side with vomiting once.  She has hd issues with vertigo last few days similar to priors, but not today.  Some loose stools that has been chronic.  She denies fevers, chills, no CP, palpitations, SOB, sweats.  She reports en route, symptoms have completely resolved and feels much improved.  No prior h/o CAD.    Patient is a 78 y.o. female presenting with abdominal pain. The history is provided by the patient.  Abdominal Pain The primary symptoms of the illness include abdominal pain, nausea and vomiting. The primary symptoms of the illness do not include fever, shortness of breath, diarrhea or dysuria.  Symptoms associated with the illness do not include chills, diaphoresis or constipation.    Past Medical History  Diagnosis Date  . Macular degeneration     Past Surgical History  Procedure Date  . Abdominal hysterectomy     History reviewed. No pertinent family history.  History  Substance Use Topics  . Smoking status: Never Smoker   . Smokeless tobacco: Not on file  . Alcohol Use: No    OB History    Grav Para Term Preterm Abortions TAB SAB Ect Mult Living                  Review of Systems  Constitutional: Negative for fever, chills and diaphoresis.  Respiratory: Negative for shortness of breath.   Cardiovascular: Negative for chest pain.  Gastrointestinal: Positive for nausea, vomiting and abdominal pain. Negative for diarrhea and constipation.  Genitourinary: Positive for flank pain.  Negative for dysuria and pelvic pain.  All other systems reviewed and are negative.    Allergies  Review of patient's allergies indicates no known allergies.  Home Medications   Current Outpatient Rx  Name Route Sig Dispense Refill  . FLUTICASONE PROPIONATE 50 MCG/ACT NA SUSP Nasal Place 2 sprays into the nose daily as needed. For congestion.    . IBUPROFEN 200 MG PO TABS Oral Take 200 mg by mouth every 6 (six) hours as needed. For pain.    Marland Kitchen LORATADINE 10 MG PO TABS Oral Take 10 mg by mouth daily.    Marland Kitchen METRONIDAZOLE 0.75 % EX LOTN Apply externally Apply 1 application topically as needed. For rosacea.    Marland Kitchen PRESERVISION AREDS PO Oral Take 1 tablet by mouth 2 (two) times daily.    Marland Kitchen HYDROCODONE-ACETAMINOPHEN 5-325 MG PO TABS  1-2 tablets po q 6 hours prn moderate to severe pain 15 tablet 0  . ONDANSETRON 8 MG PO TBDP Oral Take 1 tablet (8 mg total) by mouth every 12 (twelve) hours as needed for nausea. 20 tablet 0    BP 144/57  Pulse 63  Temp 97.7 F (36.5 C) (Oral)  Resp 16  SpO2 98%  Physical Exam  Nursing note and vitals reviewed. Constitutional: She appears well-developed and well-nourished. No distress.  HENT:  Head: Normocephalic and atraumatic.  Eyes: Pupils are equal, round, and reactive to light. No scleral icterus.  Neck: Neck supple.  Cardiovascular: Normal rate  and regular rhythm.   Pulmonary/Chest: Effort normal. No respiratory distress. She has no wheezes.  Abdominal: Soft. She exhibits no distension. There is no tenderness. There is no rebound and no guarding.  Musculoskeletal: She exhibits no edema and no tenderness.  Neurological: She is alert.  Skin: Skin is warm and dry. No rash noted. She is not diaphoretic.    ED Course  Procedures (including critical care time)  Labs Reviewed  COMPREHENSIVE METABOLIC PANEL - Abnormal; Notable for the following:    AST 50 (*)     GFR calc non Af Amer 54 (*)     GFR calc Af Amer 63 (*)     All other components  within normal limits  CBC WITH DIFFERENTIAL - Abnormal; Notable for the following:    WBC 11.5 (*)     Neutrophils Relative 79 (*)     Neutro Abs 9.1 (*)     All other components within normal limits  URINALYSIS, ROUTINE W REFLEX MICROSCOPIC - Abnormal; Notable for the following:    APPearance HAZY (*)     All other components within normal limits  LIPASE, BLOOD  TROPONIN I   US Abdomen Complete  04/16/2012  *RADIOLOGY REPORT*  Clinical Data:  Abdominal pain.  ABDOMINAL ULTRASOUND COMPLETE  Comparison:  None.  Findings:  Gallbladder:  A few tiny mobile gallstones are seen.  No evidence of gallbladder wall thickening, pericholecystic fluid, or sonographic Murphy's sign.  Common Bile Duct:  Within normal limits in caliber. Measures 6 mm in diameter.  Liver: No focal mass lesion identified.  Within normal limits in parenchymal echogenicity.  IVC:  Appears normal.  Pancreas:  No abnormality identified.  Spleen:  Within normal limits in size and echotexture.  Right kidney:  Normal in size and parenchymal echogenicity.  No evidence of mass or hydronephrosis.  Tiny cyst noted in lower pole.  Left kidney:  Normal in size and parenchymal echogenicity.  No evidence of mass or hydronephrosis.  Tiny cyst noted in the upper pole.  Abdominal Aorta:  No aneurysm identified.  Atherosclerotic plaque noted.  IMPRESSION:  1.  Cholelithiasis. 2.  No evidence of cholecystitis, biliary dilatation, or other acute findings.  Original Report Authenticated By: Danae Orleans, M.D.     1. Biliary colic     RA sat is 98% and I interpret to be normal.  ECG at time 13:34 shows SR, borderline first degree AV block, normal axis, no ST or T wave abns.  No sig change from ECG on 11/06/11 except PR is now more prolonged.    4:22 PM abd remains soft, if she can tolerate PO's, will dc home  MDM  Pt's symptoms resolved spontaneously.  Given location and symptoms, I suspected biliary colic . abd exam is benign.  Her U/S shows  small GB stones consistent with colicky pain.  I will refer to PCP who can refer her to GI and/or surgeon prn.  AST is slightly elevated, but lipase ok, WBC is not sig elevated.  If can tolerate PO's, will d/c home.          Gavin Pound. Merlin Golden, MD 04/16/12 1622

## 2012-04-26 DIAGNOSIS — H8110 Benign paroxysmal vertigo, unspecified ear: Secondary | ICD-10-CM | POA: Diagnosis not present

## 2012-04-26 DIAGNOSIS — K8020 Calculus of gallbladder without cholecystitis without obstruction: Secondary | ICD-10-CM | POA: Diagnosis not present

## 2012-04-26 DIAGNOSIS — F341 Dysthymic disorder: Secondary | ICD-10-CM | POA: Diagnosis not present

## 2012-04-27 DIAGNOSIS — H35059 Retinal neovascularization, unspecified, unspecified eye: Secondary | ICD-10-CM | POA: Diagnosis not present

## 2012-04-27 DIAGNOSIS — H353290 Exudative age-related macular degeneration, unspecified eye, stage unspecified: Secondary | ICD-10-CM | POA: Diagnosis not present

## 2012-06-08 DIAGNOSIS — H35059 Retinal neovascularization, unspecified, unspecified eye: Secondary | ICD-10-CM | POA: Diagnosis not present

## 2012-06-08 DIAGNOSIS — H353290 Exudative age-related macular degeneration, unspecified eye, stage unspecified: Secondary | ICD-10-CM | POA: Diagnosis not present

## 2012-06-23 DIAGNOSIS — Z1231 Encounter for screening mammogram for malignant neoplasm of breast: Secondary | ICD-10-CM | POA: Diagnosis not present

## 2012-07-06 DIAGNOSIS — Z23 Encounter for immunization: Secondary | ICD-10-CM | POA: Diagnosis not present

## 2012-07-14 DIAGNOSIS — H353290 Exudative age-related macular degeneration, unspecified eye, stage unspecified: Secondary | ICD-10-CM | POA: Diagnosis not present

## 2012-07-14 DIAGNOSIS — H35059 Retinal neovascularization, unspecified, unspecified eye: Secondary | ICD-10-CM | POA: Diagnosis not present

## 2012-07-14 DIAGNOSIS — H3570 Unspecified separation of retinal layers: Secondary | ICD-10-CM | POA: Diagnosis not present

## 2012-08-23 DIAGNOSIS — H353290 Exudative age-related macular degeneration, unspecified eye, stage unspecified: Secondary | ICD-10-CM | POA: Diagnosis not present

## 2012-08-23 DIAGNOSIS — H35059 Retinal neovascularization, unspecified, unspecified eye: Secondary | ICD-10-CM | POA: Diagnosis not present

## 2012-10-06 DIAGNOSIS — H35059 Retinal neovascularization, unspecified, unspecified eye: Secondary | ICD-10-CM | POA: Diagnosis not present

## 2012-10-06 DIAGNOSIS — H353290 Exudative age-related macular degeneration, unspecified eye, stage unspecified: Secondary | ICD-10-CM | POA: Diagnosis not present

## 2012-11-16 DIAGNOSIS — E7800 Pure hypercholesterolemia, unspecified: Secondary | ICD-10-CM | POA: Diagnosis not present

## 2012-11-16 DIAGNOSIS — R82998 Other abnormal findings in urine: Secondary | ICD-10-CM | POA: Diagnosis not present

## 2012-11-16 DIAGNOSIS — E781 Pure hyperglyceridemia: Secondary | ICD-10-CM | POA: Diagnosis not present

## 2012-11-16 DIAGNOSIS — E559 Vitamin D deficiency, unspecified: Secondary | ICD-10-CM | POA: Diagnosis not present

## 2012-11-25 DIAGNOSIS — H8110 Benign paroxysmal vertigo, unspecified ear: Secondary | ICD-10-CM | POA: Diagnosis not present

## 2012-11-25 DIAGNOSIS — Z0000 Encounter for general adult medical examination without abnormal findings: Secondary | ICD-10-CM | POA: Diagnosis not present

## 2012-11-25 DIAGNOSIS — E781 Pure hyperglyceridemia: Secondary | ICD-10-CM | POA: Diagnosis not present

## 2012-11-25 DIAGNOSIS — F341 Dysthymic disorder: Secondary | ICD-10-CM | POA: Diagnosis not present

## 2012-12-01 DIAGNOSIS — H353290 Exudative age-related macular degeneration, unspecified eye, stage unspecified: Secondary | ICD-10-CM | POA: Diagnosis not present

## 2012-12-01 DIAGNOSIS — H353190 Nonexudative age-related macular degeneration, unspecified eye, stage unspecified: Secondary | ICD-10-CM | POA: Diagnosis not present

## 2012-12-01 DIAGNOSIS — H35359 Cystoid macular degeneration, unspecified eye: Secondary | ICD-10-CM | POA: Diagnosis not present

## 2012-12-06 DIAGNOSIS — Z961 Presence of intraocular lens: Secondary | ICD-10-CM | POA: Diagnosis not present

## 2013-03-30 DIAGNOSIS — H353190 Nonexudative age-related macular degeneration, unspecified eye, stage unspecified: Secondary | ICD-10-CM | POA: Diagnosis not present

## 2013-03-30 DIAGNOSIS — H35059 Retinal neovascularization, unspecified, unspecified eye: Secondary | ICD-10-CM | POA: Diagnosis not present

## 2013-03-30 DIAGNOSIS — H3560 Retinal hemorrhage, unspecified eye: Secondary | ICD-10-CM | POA: Diagnosis not present

## 2013-03-30 DIAGNOSIS — H35369 Drusen (degenerative) of macula, unspecified eye: Secondary | ICD-10-CM | POA: Diagnosis not present

## 2013-05-24 DIAGNOSIS — Z23 Encounter for immunization: Secondary | ICD-10-CM | POA: Diagnosis not present

## 2013-05-25 DIAGNOSIS — Z6827 Body mass index (BMI) 27.0-27.9, adult: Secondary | ICD-10-CM | POA: Diagnosis not present

## 2013-05-25 DIAGNOSIS — R030 Elevated blood-pressure reading, without diagnosis of hypertension: Secondary | ICD-10-CM | POA: Diagnosis not present

## 2013-05-25 DIAGNOSIS — Z1331 Encounter for screening for depression: Secondary | ICD-10-CM | POA: Diagnosis not present

## 2013-05-25 DIAGNOSIS — H8110 Benign paroxysmal vertigo, unspecified ear: Secondary | ICD-10-CM | POA: Diagnosis not present

## 2013-05-25 DIAGNOSIS — F341 Dysthymic disorder: Secondary | ICD-10-CM | POA: Diagnosis not present

## 2013-06-13 DIAGNOSIS — F331 Major depressive disorder, recurrent, moderate: Secondary | ICD-10-CM | POA: Diagnosis not present

## 2013-06-21 DIAGNOSIS — F331 Major depressive disorder, recurrent, moderate: Secondary | ICD-10-CM | POA: Diagnosis not present

## 2013-07-05 DIAGNOSIS — F331 Major depressive disorder, recurrent, moderate: Secondary | ICD-10-CM | POA: Diagnosis not present

## 2013-07-06 DIAGNOSIS — Z1231 Encounter for screening mammogram for malignant neoplasm of breast: Secondary | ICD-10-CM | POA: Diagnosis not present

## 2013-07-07 DIAGNOSIS — F341 Dysthymic disorder: Secondary | ICD-10-CM | POA: Diagnosis not present

## 2013-07-07 DIAGNOSIS — H8110 Benign paroxysmal vertigo, unspecified ear: Secondary | ICD-10-CM | POA: Diagnosis not present

## 2013-08-01 DIAGNOSIS — F331 Major depressive disorder, recurrent, moderate: Secondary | ICD-10-CM | POA: Diagnosis not present

## 2013-10-10 DIAGNOSIS — H35369 Drusen (degenerative) of macula, unspecified eye: Secondary | ICD-10-CM | POA: Diagnosis not present

## 2013-10-10 DIAGNOSIS — H353190 Nonexudative age-related macular degeneration, unspecified eye, stage unspecified: Secondary | ICD-10-CM | POA: Diagnosis not present

## 2013-10-10 DIAGNOSIS — H3560 Retinal hemorrhage, unspecified eye: Secondary | ICD-10-CM | POA: Diagnosis not present

## 2013-11-20 DIAGNOSIS — R030 Elevated blood-pressure reading, without diagnosis of hypertension: Secondary | ICD-10-CM | POA: Diagnosis not present

## 2013-11-20 DIAGNOSIS — E559 Vitamin D deficiency, unspecified: Secondary | ICD-10-CM | POA: Diagnosis not present

## 2013-11-20 DIAGNOSIS — E781 Pure hyperglyceridemia: Secondary | ICD-10-CM | POA: Diagnosis not present

## 2013-11-20 DIAGNOSIS — Z0000 Encounter for general adult medical examination without abnormal findings: Secondary | ICD-10-CM | POA: Diagnosis not present

## 2013-11-20 DIAGNOSIS — K8020 Calculus of gallbladder without cholecystitis without obstruction: Secondary | ICD-10-CM | POA: Diagnosis not present

## 2013-12-04 DIAGNOSIS — M545 Low back pain, unspecified: Secondary | ICD-10-CM | POA: Diagnosis not present

## 2013-12-04 DIAGNOSIS — Z1331 Encounter for screening for depression: Secondary | ICD-10-CM | POA: Diagnosis not present

## 2013-12-04 DIAGNOSIS — H6980 Other specified disorders of Eustachian tube, unspecified ear: Secondary | ICD-10-CM | POA: Diagnosis not present

## 2013-12-04 DIAGNOSIS — E559 Vitamin D deficiency, unspecified: Secondary | ICD-10-CM | POA: Diagnosis not present

## 2013-12-04 DIAGNOSIS — K8020 Calculus of gallbladder without cholecystitis without obstruction: Secondary | ICD-10-CM | POA: Diagnosis not present

## 2013-12-04 DIAGNOSIS — Z0000 Encounter for general adult medical examination without abnormal findings: Secondary | ICD-10-CM | POA: Diagnosis not present

## 2013-12-04 DIAGNOSIS — E781 Pure hyperglyceridemia: Secondary | ICD-10-CM | POA: Diagnosis not present

## 2013-12-04 DIAGNOSIS — F341 Dysthymic disorder: Secondary | ICD-10-CM | POA: Diagnosis not present

## 2013-12-04 DIAGNOSIS — R030 Elevated blood-pressure reading, without diagnosis of hypertension: Secondary | ICD-10-CM | POA: Diagnosis not present

## 2013-12-08 DIAGNOSIS — Z961 Presence of intraocular lens: Secondary | ICD-10-CM | POA: Diagnosis not present

## 2013-12-08 DIAGNOSIS — H52209 Unspecified astigmatism, unspecified eye: Secondary | ICD-10-CM | POA: Diagnosis not present

## 2013-12-08 DIAGNOSIS — H3530 Unspecified macular degeneration: Secondary | ICD-10-CM | POA: Diagnosis not present

## 2014-01-02 DIAGNOSIS — R112 Nausea with vomiting, unspecified: Secondary | ICD-10-CM | POA: Diagnosis not present

## 2014-01-02 DIAGNOSIS — K8020 Calculus of gallbladder without cholecystitis without obstruction: Secondary | ICD-10-CM | POA: Diagnosis not present

## 2014-01-02 DIAGNOSIS — Z6827 Body mass index (BMI) 27.0-27.9, adult: Secondary | ICD-10-CM | POA: Diagnosis not present

## 2014-01-02 DIAGNOSIS — K3189 Other diseases of stomach and duodenum: Secondary | ICD-10-CM | POA: Diagnosis not present

## 2014-01-02 DIAGNOSIS — J069 Acute upper respiratory infection, unspecified: Secondary | ICD-10-CM | POA: Diagnosis not present

## 2014-01-02 DIAGNOSIS — R1013 Epigastric pain: Secondary | ICD-10-CM | POA: Diagnosis not present

## 2014-01-04 DIAGNOSIS — N281 Cyst of kidney, acquired: Secondary | ICD-10-CM | POA: Diagnosis not present

## 2014-01-04 DIAGNOSIS — K829 Disease of gallbladder, unspecified: Secondary | ICD-10-CM | POA: Diagnosis not present

## 2014-01-19 ENCOUNTER — Encounter (INDEPENDENT_AMBULATORY_CARE_PROVIDER_SITE_OTHER): Payer: Self-pay | Admitting: General Surgery

## 2014-01-19 ENCOUNTER — Ambulatory Visit (INDEPENDENT_AMBULATORY_CARE_PROVIDER_SITE_OTHER): Payer: Medicare Other | Admitting: General Surgery

## 2014-01-19 VITALS — BP 148/72 | HR 72 | Temp 97.1°F | Resp 14 | Ht 64.0 in | Wt 160.0 lb

## 2014-01-19 DIAGNOSIS — K8020 Calculus of gallbladder without cholecystitis without obstruction: Secondary | ICD-10-CM | POA: Insufficient documentation

## 2014-01-19 NOTE — Progress Notes (Signed)
Patient ID: Charlene [name], female   DOB: 1927/11/25, 80 y.o.   MRN: [id number]  Chief Complaint  Patient presents with  . New Evaluation    eval GB sludge    HPI Charlene [name] is a 80 y.o. female. CC -  Epigastric discomfort and gallbladder sludge HPI Patient has epigastric discomfort and pressure periodically. She also has heartburn. Her heartburn has resolved since she stopped drinking coffee. She does take Pepcid over-the-counter. Recently, she developed significant nausea and vomiting. She was evaluated with abdominal ultrasound which demonstrated sludge. Of note, she previously had an abdominal ultrasound in 2013 which demonstrated gallstones. Since that time, she is feeling much better. She's never had what she describes as pain. Past Medical History  Diagnosis Date  . Macular degeneration   . GERD (gastroesophageal reflux disease)   . Allergy     seasonal  . BPPV (benign paroxysmal positional vertigo) 2012    Past Surgical History  Procedure Laterality Date  . Abdominal hysterectomy    . Eye surgery      cataract removal bilateral 2012    Family History  Problem Relation Age of Onset  . Heart disease Mother   . Stroke Father   . Cancer Brother     lung cancer  . Heart disease Brother     Social History History  Substance Use Topics  . Smoking status: Former Smoker    Quit date: 01/19/1973  . Smokeless tobacco: Not on file  . Alcohol Use: Yes     Comment: occ    No Known Allergies  Current Outpatient Prescriptions  Medication Sig Dispense Refill  . fluticasone (FLONASE) 50 MCG/ACT nasal spray Place 2 sprays into the nose daily as needed. For congestion.      Marland Kitchen ibuprofen (ADVIL,MOTRIN) 200 MG tablet Take 200 mg by mouth every 6 (six) hours as needed. For pain.      Marland Kitchen loratadine (CLARITIN) 10 MG tablet Take 10 mg by mouth daily.      Marland Kitchen METRONIDAZOLE, TOPICAL, 0.75 % LOTN Apply 1 application topically as needed. For rosacea.      . Multiple Vitamins-Minerals  (PRESERVISION AREDS PO) Take 1 tablet by mouth 2 (two) times daily.      Marland Kitchen omega-3 acid ethyl esters (LOVAZA) 1 G capsule Take by mouth 2 (two) times daily.      Marland Kitchen tetrahydrozoline 0.05 % ophthalmic solution        No current facility-administered medications for this visit.    Review of Systems Review of Systems  Constitutional: Negative for fever, chills and unexpected weight change.  HENT: Negative for congestion, hearing loss, sore throat, trouble swallowing and voice change.   Eyes: Negative for visual disturbance.  Respiratory: Negative for cough and wheezing.   Cardiovascular: Negative for chest pain, palpitations and leg swelling.  Gastrointestinal: Negative for nausea, vomiting, abdominal pain, diarrhea, constipation, blood in stool, abdominal distention and anal bleeding.       Epigastric pressure as described in history of present illness  Genitourinary: Negative for hematuria, vaginal bleeding and difficulty urinating.  Musculoskeletal: Negative for arthralgias.  Skin: Negative for rash and wound.  Neurological: Negative for seizures, syncope and headaches.  Hematological: Negative for adenopathy. Does not bruise/bleed easily.  Psychiatric/Behavioral: Negative for confusion.    Blood pressure 148/72, pulse 72, temperature 97.1 F (36.2 C), resp. rate 14, height 5\' 4"  (1.626 m), weight 160 lb (72.576 kg).  Physical Exam Physical Exam  Constitutional: She is oriented to person, place,  and time. She appears well-developed and well-nourished.  HENT:  Head: Normocephalic and atraumatic.  Eyes: EOM are normal. Pupils are equal, round, and reactive to light.  Neck: Normal range of motion. Neck supple. No tracheal deviation present.  Cardiovascular: Normal rate and normal heart sounds.   Pulmonary/Chest: Effort normal and breath sounds normal. No stridor. No respiratory distress. She has no wheezes. She has no rales.  Abdominal: Soft. Bowel sounds are normal. She exhibits no  distension. There is no tenderness. There is no rebound and no guarding.  Musculoskeletal: Normal range of motion.  Neurological: She is alert and oriented to person, place, and time.  Skin: Skin is warm and dry.  Psychiatric: She has a normal mood and affect.    Data Reviewed Ultrasound reports  Assessment    Symptomatic cholelithiasis    Plan    I've offered laparoscopic cholecystectomy and we discussed the procedure, risks, and benefits. She would like to hold off on surgery at this time. She feels her symptoms are not severe enough to warrant surgery. She has agreed to call me if the symptoms worsen and certainly if she has any right upper quadrant pain. We will be happy to schedule for cholecystectomy at that time or should she change her mind.       Zenovia Jarred 01/19/2014, 11:05 AM

## 2014-01-19 NOTE — Patient Instructions (Signed)
Plese call if you have worsening symptoms or if you change your mind and we will be happy to schedule your cholecystectomy

## 2014-02-02 DIAGNOSIS — Z6826 Body mass index (BMI) 26.0-26.9, adult: Secondary | ICD-10-CM | POA: Diagnosis not present

## 2014-02-02 DIAGNOSIS — H8110 Benign paroxysmal vertigo, unspecified ear: Secondary | ICD-10-CM | POA: Diagnosis not present

## 2014-02-02 DIAGNOSIS — K3189 Other diseases of stomach and duodenum: Secondary | ICD-10-CM | POA: Diagnosis not present

## 2014-02-02 DIAGNOSIS — R1013 Epigastric pain: Secondary | ICD-10-CM | POA: Diagnosis not present

## 2014-02-02 DIAGNOSIS — K829 Disease of gallbladder, unspecified: Secondary | ICD-10-CM | POA: Diagnosis not present

## 2014-02-02 DIAGNOSIS — F341 Dysthymic disorder: Secondary | ICD-10-CM | POA: Diagnosis not present

## 2014-02-07 DIAGNOSIS — M899 Disorder of bone, unspecified: Secondary | ICD-10-CM | POA: Diagnosis not present

## 2014-02-07 DIAGNOSIS — M949 Disorder of cartilage, unspecified: Secondary | ICD-10-CM | POA: Diagnosis not present

## 2014-02-09 ENCOUNTER — Encounter (INDEPENDENT_AMBULATORY_CARE_PROVIDER_SITE_OTHER): Payer: Self-pay

## 2014-02-13 DIAGNOSIS — I69998 Other sequelae following unspecified cerebrovascular disease: Secondary | ICD-10-CM | POA: Diagnosis not present

## 2014-02-28 DIAGNOSIS — R42 Dizziness and giddiness: Secondary | ICD-10-CM | POA: Diagnosis not present

## 2014-02-28 DIAGNOSIS — I69998 Other sequelae following unspecified cerebrovascular disease: Secondary | ICD-10-CM | POA: Diagnosis not present

## 2014-03-15 DIAGNOSIS — R42 Dizziness and giddiness: Secondary | ICD-10-CM | POA: Diagnosis not present

## 2014-03-15 DIAGNOSIS — I69998 Other sequelae following unspecified cerebrovascular disease: Secondary | ICD-10-CM | POA: Diagnosis not present

## 2014-04-10 DIAGNOSIS — H35369 Drusen (degenerative) of macula, unspecified eye: Secondary | ICD-10-CM | POA: Diagnosis not present

## 2014-04-10 DIAGNOSIS — H3560 Retinal hemorrhage, unspecified eye: Secondary | ICD-10-CM | POA: Diagnosis not present

## 2014-04-10 DIAGNOSIS — H353190 Nonexudative age-related macular degeneration, unspecified eye, stage unspecified: Secondary | ICD-10-CM | POA: Diagnosis not present

## 2014-06-22 DIAGNOSIS — Z23 Encounter for immunization: Secondary | ICD-10-CM | POA: Diagnosis not present

## 2014-06-29 DIAGNOSIS — M999 Biomechanical lesion, unspecified: Secondary | ICD-10-CM | POA: Diagnosis not present

## 2014-06-29 DIAGNOSIS — M542 Cervicalgia: Secondary | ICD-10-CM | POA: Diagnosis not present

## 2014-06-29 DIAGNOSIS — M9981 Other biomechanical lesions of cervical region: Secondary | ICD-10-CM | POA: Diagnosis not present

## 2014-06-30 DIAGNOSIS — M999 Biomechanical lesion, unspecified: Secondary | ICD-10-CM | POA: Diagnosis not present

## 2014-06-30 DIAGNOSIS — M542 Cervicalgia: Secondary | ICD-10-CM | POA: Diagnosis not present

## 2014-06-30 DIAGNOSIS — M9981 Other biomechanical lesions of cervical region: Secondary | ICD-10-CM | POA: Diagnosis not present

## 2014-07-02 DIAGNOSIS — M9981 Other biomechanical lesions of cervical region: Secondary | ICD-10-CM | POA: Diagnosis not present

## 2014-07-02 DIAGNOSIS — M542 Cervicalgia: Secondary | ICD-10-CM | POA: Diagnosis not present

## 2014-07-02 DIAGNOSIS — M999 Biomechanical lesion, unspecified: Secondary | ICD-10-CM | POA: Diagnosis not present

## 2014-07-04 DIAGNOSIS — M9981 Other biomechanical lesions of cervical region: Secondary | ICD-10-CM | POA: Diagnosis not present

## 2014-07-04 DIAGNOSIS — M999 Biomechanical lesion, unspecified: Secondary | ICD-10-CM | POA: Diagnosis not present

## 2014-07-04 DIAGNOSIS — M542 Cervicalgia: Secondary | ICD-10-CM | POA: Diagnosis not present

## 2014-07-06 ENCOUNTER — Ambulatory Visit: Payer: Self-pay | Admitting: Podiatry

## 2014-07-06 DIAGNOSIS — M545 Low back pain: Secondary | ICD-10-CM | POA: Diagnosis not present

## 2014-07-06 DIAGNOSIS — M47816 Spondylosis without myelopathy or radiculopathy, lumbar region: Secondary | ICD-10-CM | POA: Diagnosis not present

## 2014-07-06 DIAGNOSIS — M5137 Other intervertebral disc degeneration, lumbosacral region: Secondary | ICD-10-CM | POA: Diagnosis not present

## 2014-07-06 DIAGNOSIS — M9903 Segmental and somatic dysfunction of lumbar region: Secondary | ICD-10-CM | POA: Diagnosis not present

## 2014-07-09 DIAGNOSIS — M5137 Other intervertebral disc degeneration, lumbosacral region: Secondary | ICD-10-CM | POA: Diagnosis not present

## 2014-07-09 DIAGNOSIS — M545 Low back pain: Secondary | ICD-10-CM | POA: Diagnosis not present

## 2014-07-09 DIAGNOSIS — M9903 Segmental and somatic dysfunction of lumbar region: Secondary | ICD-10-CM | POA: Diagnosis not present

## 2014-07-09 DIAGNOSIS — M47816 Spondylosis without myelopathy or radiculopathy, lumbar region: Secondary | ICD-10-CM | POA: Diagnosis not present

## 2014-07-10 DIAGNOSIS — M9903 Segmental and somatic dysfunction of lumbar region: Secondary | ICD-10-CM | POA: Diagnosis not present

## 2014-07-10 DIAGNOSIS — M545 Low back pain: Secondary | ICD-10-CM | POA: Diagnosis not present

## 2014-07-10 DIAGNOSIS — M5137 Other intervertebral disc degeneration, lumbosacral region: Secondary | ICD-10-CM | POA: Diagnosis not present

## 2014-07-10 DIAGNOSIS — M47816 Spondylosis without myelopathy or radiculopathy, lumbar region: Secondary | ICD-10-CM | POA: Diagnosis not present

## 2014-07-12 DIAGNOSIS — M9903 Segmental and somatic dysfunction of lumbar region: Secondary | ICD-10-CM | POA: Diagnosis not present

## 2014-07-12 DIAGNOSIS — M47816 Spondylosis without myelopathy or radiculopathy, lumbar region: Secondary | ICD-10-CM | POA: Diagnosis not present

## 2014-07-12 DIAGNOSIS — M5137 Other intervertebral disc degeneration, lumbosacral region: Secondary | ICD-10-CM | POA: Diagnosis not present

## 2014-07-12 DIAGNOSIS — M545 Low back pain: Secondary | ICD-10-CM | POA: Diagnosis not present

## 2014-07-16 DIAGNOSIS — M5137 Other intervertebral disc degeneration, lumbosacral region: Secondary | ICD-10-CM | POA: Diagnosis not present

## 2014-07-16 DIAGNOSIS — M9903 Segmental and somatic dysfunction of lumbar region: Secondary | ICD-10-CM | POA: Diagnosis not present

## 2014-07-16 DIAGNOSIS — M545 Low back pain: Secondary | ICD-10-CM | POA: Diagnosis not present

## 2014-07-16 DIAGNOSIS — M47816 Spondylosis without myelopathy or radiculopathy, lumbar region: Secondary | ICD-10-CM | POA: Diagnosis not present

## 2014-07-18 DIAGNOSIS — M9903 Segmental and somatic dysfunction of lumbar region: Secondary | ICD-10-CM | POA: Diagnosis not present

## 2014-07-18 DIAGNOSIS — M545 Low back pain: Secondary | ICD-10-CM | POA: Diagnosis not present

## 2014-07-18 DIAGNOSIS — M47816 Spondylosis without myelopathy or radiculopathy, lumbar region: Secondary | ICD-10-CM | POA: Diagnosis not present

## 2014-07-18 DIAGNOSIS — M5137 Other intervertebral disc degeneration, lumbosacral region: Secondary | ICD-10-CM | POA: Diagnosis not present

## 2014-07-20 DIAGNOSIS — M47816 Spondylosis without myelopathy or radiculopathy, lumbar region: Secondary | ICD-10-CM | POA: Diagnosis not present

## 2014-07-20 DIAGNOSIS — M5137 Other intervertebral disc degeneration, lumbosacral region: Secondary | ICD-10-CM | POA: Diagnosis not present

## 2014-07-20 DIAGNOSIS — M545 Low back pain: Secondary | ICD-10-CM | POA: Diagnosis not present

## 2014-07-20 DIAGNOSIS — M9903 Segmental and somatic dysfunction of lumbar region: Secondary | ICD-10-CM | POA: Diagnosis not present

## 2014-07-23 DIAGNOSIS — M5137 Other intervertebral disc degeneration, lumbosacral region: Secondary | ICD-10-CM | POA: Diagnosis not present

## 2014-07-23 DIAGNOSIS — M9903 Segmental and somatic dysfunction of lumbar region: Secondary | ICD-10-CM | POA: Diagnosis not present

## 2014-07-23 DIAGNOSIS — M545 Low back pain: Secondary | ICD-10-CM | POA: Diagnosis not present

## 2014-07-23 DIAGNOSIS — M47816 Spondylosis without myelopathy or radiculopathy, lumbar region: Secondary | ICD-10-CM | POA: Diagnosis not present

## 2014-07-25 DIAGNOSIS — M47816 Spondylosis without myelopathy or radiculopathy, lumbar region: Secondary | ICD-10-CM | POA: Diagnosis not present

## 2014-07-25 DIAGNOSIS — M5137 Other intervertebral disc degeneration, lumbosacral region: Secondary | ICD-10-CM | POA: Diagnosis not present

## 2014-07-25 DIAGNOSIS — M9903 Segmental and somatic dysfunction of lumbar region: Secondary | ICD-10-CM | POA: Diagnosis not present

## 2014-07-25 DIAGNOSIS — M545 Low back pain: Secondary | ICD-10-CM | POA: Diagnosis not present

## 2014-07-29 DIAGNOSIS — H60339 Swimmer's ear, unspecified ear: Secondary | ICD-10-CM | POA: Diagnosis not present

## 2014-07-30 DIAGNOSIS — M47816 Spondylosis without myelopathy or radiculopathy, lumbar region: Secondary | ICD-10-CM | POA: Diagnosis not present

## 2014-07-30 DIAGNOSIS — M545 Low back pain: Secondary | ICD-10-CM | POA: Diagnosis not present

## 2014-07-30 DIAGNOSIS — M9903 Segmental and somatic dysfunction of lumbar region: Secondary | ICD-10-CM | POA: Diagnosis not present

## 2014-07-30 DIAGNOSIS — M5137 Other intervertebral disc degeneration, lumbosacral region: Secondary | ICD-10-CM | POA: Diagnosis not present

## 2014-08-03 DIAGNOSIS — Z6826 Body mass index (BMI) 26.0-26.9, adult: Secondary | ICD-10-CM | POA: Diagnosis not present

## 2014-08-03 DIAGNOSIS — J309 Allergic rhinitis, unspecified: Secondary | ICD-10-CM | POA: Diagnosis not present

## 2014-08-03 DIAGNOSIS — J0100 Acute maxillary sinusitis, unspecified: Secondary | ICD-10-CM | POA: Diagnosis not present

## 2014-08-03 DIAGNOSIS — H6980 Other specified disorders of Eustachian tube, unspecified ear: Secondary | ICD-10-CM | POA: Diagnosis not present

## 2014-08-06 DIAGNOSIS — M5137 Other intervertebral disc degeneration, lumbosacral region: Secondary | ICD-10-CM | POA: Diagnosis not present

## 2014-08-06 DIAGNOSIS — M9903 Segmental and somatic dysfunction of lumbar region: Secondary | ICD-10-CM | POA: Diagnosis not present

## 2014-08-06 DIAGNOSIS — M545 Low back pain: Secondary | ICD-10-CM | POA: Diagnosis not present

## 2014-08-06 DIAGNOSIS — M47816 Spondylosis without myelopathy or radiculopathy, lumbar region: Secondary | ICD-10-CM | POA: Diagnosis not present

## 2014-08-09 DIAGNOSIS — M545 Low back pain: Secondary | ICD-10-CM | POA: Diagnosis not present

## 2014-08-09 DIAGNOSIS — M9903 Segmental and somatic dysfunction of lumbar region: Secondary | ICD-10-CM | POA: Diagnosis not present

## 2014-08-09 DIAGNOSIS — M47816 Spondylosis without myelopathy or radiculopathy, lumbar region: Secondary | ICD-10-CM | POA: Diagnosis not present

## 2014-08-09 DIAGNOSIS — M5137 Other intervertebral disc degeneration, lumbosacral region: Secondary | ICD-10-CM | POA: Diagnosis not present

## 2014-08-13 DIAGNOSIS — M47816 Spondylosis without myelopathy or radiculopathy, lumbar region: Secondary | ICD-10-CM | POA: Diagnosis not present

## 2014-08-13 DIAGNOSIS — M5137 Other intervertebral disc degeneration, lumbosacral region: Secondary | ICD-10-CM | POA: Diagnosis not present

## 2014-08-13 DIAGNOSIS — M9903 Segmental and somatic dysfunction of lumbar region: Secondary | ICD-10-CM | POA: Diagnosis not present

## 2014-08-13 DIAGNOSIS — M545 Low back pain: Secondary | ICD-10-CM | POA: Diagnosis not present

## 2014-08-16 DIAGNOSIS — M9903 Segmental and somatic dysfunction of lumbar region: Secondary | ICD-10-CM | POA: Diagnosis not present

## 2014-08-16 DIAGNOSIS — M5137 Other intervertebral disc degeneration, lumbosacral region: Secondary | ICD-10-CM | POA: Diagnosis not present

## 2014-08-16 DIAGNOSIS — M47816 Spondylosis without myelopathy or radiculopathy, lumbar region: Secondary | ICD-10-CM | POA: Diagnosis not present

## 2014-08-16 DIAGNOSIS — M545 Low back pain: Secondary | ICD-10-CM | POA: Diagnosis not present

## 2014-08-20 DIAGNOSIS — M9903 Segmental and somatic dysfunction of lumbar region: Secondary | ICD-10-CM | POA: Diagnosis not present

## 2014-08-20 DIAGNOSIS — M545 Low back pain: Secondary | ICD-10-CM | POA: Diagnosis not present

## 2014-08-20 DIAGNOSIS — M47816 Spondylosis without myelopathy or radiculopathy, lumbar region: Secondary | ICD-10-CM | POA: Diagnosis not present

## 2014-08-20 DIAGNOSIS — M5137 Other intervertebral disc degeneration, lumbosacral region: Secondary | ICD-10-CM | POA: Diagnosis not present

## 2014-08-23 DIAGNOSIS — M9903 Segmental and somatic dysfunction of lumbar region: Secondary | ICD-10-CM | POA: Diagnosis not present

## 2014-08-23 DIAGNOSIS — M545 Low back pain: Secondary | ICD-10-CM | POA: Diagnosis not present

## 2014-08-23 DIAGNOSIS — M47816 Spondylosis without myelopathy or radiculopathy, lumbar region: Secondary | ICD-10-CM | POA: Diagnosis not present

## 2014-08-23 DIAGNOSIS — M5137 Other intervertebral disc degeneration, lumbosacral region: Secondary | ICD-10-CM | POA: Diagnosis not present

## 2014-08-27 DIAGNOSIS — M47816 Spondylosis without myelopathy or radiculopathy, lumbar region: Secondary | ICD-10-CM | POA: Diagnosis not present

## 2014-08-27 DIAGNOSIS — M545 Low back pain: Secondary | ICD-10-CM | POA: Diagnosis not present

## 2014-08-27 DIAGNOSIS — M5137 Other intervertebral disc degeneration, lumbosacral region: Secondary | ICD-10-CM | POA: Diagnosis not present

## 2014-08-27 DIAGNOSIS — M9903 Segmental and somatic dysfunction of lumbar region: Secondary | ICD-10-CM | POA: Diagnosis not present

## 2014-09-03 DIAGNOSIS — M9903 Segmental and somatic dysfunction of lumbar region: Secondary | ICD-10-CM | POA: Diagnosis not present

## 2014-09-03 DIAGNOSIS — M545 Low back pain: Secondary | ICD-10-CM | POA: Diagnosis not present

## 2014-09-03 DIAGNOSIS — M47816 Spondylosis without myelopathy or radiculopathy, lumbar region: Secondary | ICD-10-CM | POA: Diagnosis not present

## 2014-09-03 DIAGNOSIS — M5137 Other intervertebral disc degeneration, lumbosacral region: Secondary | ICD-10-CM | POA: Diagnosis not present

## 2014-09-06 DIAGNOSIS — M47816 Spondylosis without myelopathy or radiculopathy, lumbar region: Secondary | ICD-10-CM | POA: Diagnosis not present

## 2014-09-06 DIAGNOSIS — M545 Low back pain: Secondary | ICD-10-CM | POA: Diagnosis not present

## 2014-09-06 DIAGNOSIS — M5137 Other intervertebral disc degeneration, lumbosacral region: Secondary | ICD-10-CM | POA: Diagnosis not present

## 2014-09-06 DIAGNOSIS — M9903 Segmental and somatic dysfunction of lumbar region: Secondary | ICD-10-CM | POA: Diagnosis not present

## 2014-09-10 DIAGNOSIS — M545 Low back pain: Secondary | ICD-10-CM | POA: Diagnosis not present

## 2014-09-10 DIAGNOSIS — M5137 Other intervertebral disc degeneration, lumbosacral region: Secondary | ICD-10-CM | POA: Diagnosis not present

## 2014-09-10 DIAGNOSIS — M9903 Segmental and somatic dysfunction of lumbar region: Secondary | ICD-10-CM | POA: Diagnosis not present

## 2014-09-10 DIAGNOSIS — M47816 Spondylosis without myelopathy or radiculopathy, lumbar region: Secondary | ICD-10-CM | POA: Diagnosis not present

## 2014-09-13 DIAGNOSIS — M47816 Spondylosis without myelopathy or radiculopathy, lumbar region: Secondary | ICD-10-CM | POA: Diagnosis not present

## 2014-09-13 DIAGNOSIS — M5137 Other intervertebral disc degeneration, lumbosacral region: Secondary | ICD-10-CM | POA: Diagnosis not present

## 2014-09-13 DIAGNOSIS — M545 Low back pain: Secondary | ICD-10-CM | POA: Diagnosis not present

## 2014-09-13 DIAGNOSIS — M9903 Segmental and somatic dysfunction of lumbar region: Secondary | ICD-10-CM | POA: Diagnosis not present

## 2014-09-24 DIAGNOSIS — M545 Low back pain: Secondary | ICD-10-CM | POA: Diagnosis not present

## 2014-09-24 DIAGNOSIS — M9903 Segmental and somatic dysfunction of lumbar region: Secondary | ICD-10-CM | POA: Diagnosis not present

## 2014-09-24 DIAGNOSIS — M5137 Other intervertebral disc degeneration, lumbosacral region: Secondary | ICD-10-CM | POA: Diagnosis not present

## 2014-09-24 DIAGNOSIS — M47816 Spondylosis without myelopathy or radiculopathy, lumbar region: Secondary | ICD-10-CM | POA: Diagnosis not present

## 2014-10-09 DIAGNOSIS — H35363 Drusen (degenerative) of macula, bilateral: Secondary | ICD-10-CM | POA: Diagnosis not present

## 2014-10-09 DIAGNOSIS — H3531 Nonexudative age-related macular degeneration: Secondary | ICD-10-CM | POA: Diagnosis not present

## 2014-10-15 DIAGNOSIS — M5137 Other intervertebral disc degeneration, lumbosacral region: Secondary | ICD-10-CM | POA: Diagnosis not present

## 2014-10-15 DIAGNOSIS — M545 Low back pain: Secondary | ICD-10-CM | POA: Diagnosis not present

## 2014-10-15 DIAGNOSIS — M9903 Segmental and somatic dysfunction of lumbar region: Secondary | ICD-10-CM | POA: Diagnosis not present

## 2014-10-15 DIAGNOSIS — M47816 Spondylosis without myelopathy or radiculopathy, lumbar region: Secondary | ICD-10-CM | POA: Diagnosis not present

## 2014-12-06 DIAGNOSIS — E559 Vitamin D deficiency, unspecified: Secondary | ICD-10-CM | POA: Diagnosis not present

## 2014-12-06 DIAGNOSIS — E780 Pure hypercholesterolemia: Secondary | ICD-10-CM | POA: Diagnosis not present

## 2014-12-06 DIAGNOSIS — Z008 Encounter for other general examination: Secondary | ICD-10-CM | POA: Diagnosis not present

## 2014-12-06 DIAGNOSIS — R8299 Other abnormal findings in urine: Secondary | ICD-10-CM | POA: Diagnosis not present

## 2014-12-06 DIAGNOSIS — E781 Pure hyperglyceridemia: Secondary | ICD-10-CM | POA: Diagnosis not present

## 2014-12-13 DIAGNOSIS — K829 Disease of gallbladder, unspecified: Secondary | ICD-10-CM | POA: Diagnosis not present

## 2014-12-13 DIAGNOSIS — Z1389 Encounter for screening for other disorder: Secondary | ICD-10-CM | POA: Diagnosis not present

## 2014-12-13 DIAGNOSIS — Z0000 Encounter for general adult medical examination without abnormal findings: Secondary | ICD-10-CM | POA: Diagnosis not present

## 2014-12-13 DIAGNOSIS — Z6827 Body mass index (BMI) 27.0-27.9, adult: Secondary | ICD-10-CM | POA: Diagnosis not present

## 2014-12-13 DIAGNOSIS — I700 Atherosclerosis of aorta: Secondary | ICD-10-CM | POA: Diagnosis not present

## 2014-12-13 DIAGNOSIS — N281 Cyst of kidney, acquired: Secondary | ICD-10-CM | POA: Diagnosis not present

## 2014-12-13 DIAGNOSIS — Z23 Encounter for immunization: Secondary | ICD-10-CM | POA: Diagnosis not present

## 2014-12-13 DIAGNOSIS — E559 Vitamin D deficiency, unspecified: Secondary | ICD-10-CM | POA: Diagnosis not present

## 2014-12-13 DIAGNOSIS — F418 Other specified anxiety disorders: Secondary | ICD-10-CM | POA: Diagnosis not present

## 2014-12-14 DIAGNOSIS — N281 Cyst of kidney, acquired: Secondary | ICD-10-CM | POA: Diagnosis not present

## 2015-01-21 DIAGNOSIS — H52203 Unspecified astigmatism, bilateral: Secondary | ICD-10-CM | POA: Diagnosis not present

## 2015-01-21 DIAGNOSIS — H3531 Nonexudative age-related macular degeneration: Secondary | ICD-10-CM | POA: Diagnosis not present

## 2015-01-21 DIAGNOSIS — Z961 Presence of intraocular lens: Secondary | ICD-10-CM | POA: Diagnosis not present

## 2015-01-21 DIAGNOSIS — H5213 Myopia, bilateral: Secondary | ICD-10-CM | POA: Diagnosis not present

## 2015-03-09 ENCOUNTER — Ambulatory Visit (HOSPITAL_COMMUNITY): Admission: RE | Admit: 2015-03-09 | Payer: Medicare Other | Admitting: Emergency Medicine

## 2015-03-09 ENCOUNTER — Ambulatory Visit (HOSPITAL_COMMUNITY)
Admission: RE | Admit: 2015-03-09 | Discharge: 2015-03-09 | Disposition: A | Discharge status: Home / Self Care | Payer: Medicare Other | Source: Ambulatory Visit | Attending: Nurse Practitioner | Admitting: Nurse Practitioner

## 2015-03-09 ENCOUNTER — Encounter (HOSPITAL_COMMUNITY): Payer: Self-pay

## 2015-03-09 ENCOUNTER — Other Ambulatory Visit (HOSPITAL_COMMUNITY): Payer: Self-pay | Admitting: Nurse Practitioner

## 2015-03-09 ENCOUNTER — Other Ambulatory Visit: Payer: Self-pay

## 2015-03-09 DIAGNOSIS — R1032 Left lower quadrant pain: Secondary | ICD-10-CM

## 2015-03-09 DIAGNOSIS — K5732 Diverticulitis of large intestine without perforation or abscess without bleeding: Secondary | ICD-10-CM | POA: Insufficient documentation

## 2015-03-09 DIAGNOSIS — N281 Cyst of kidney, acquired: Secondary | ICD-10-CM | POA: Diagnosis not present

## 2015-03-09 DIAGNOSIS — I700 Atherosclerosis of aorta: Secondary | ICD-10-CM | POA: Diagnosis not present

## 2015-03-09 DIAGNOSIS — K8020 Calculus of gallbladder without cholecystitis without obstruction: Secondary | ICD-10-CM | POA: Insufficient documentation

## 2015-03-09 DIAGNOSIS — R1030 Lower abdominal pain, unspecified: Secondary | ICD-10-CM | POA: Diagnosis present

## 2015-03-09 LAB — CREATININE, SERUM
Creatinine, Ser: 1.07 mg/dL — ABNORMAL HIGH (ref 0.44–1.00)
GFR, EST AFRICAN AMERICAN: 54 mL/min — AB (ref >60)
GFR, EST NON AFRICAN AMERICAN: 46 mL/min — AB (ref >60)

## 2015-03-09 MED ORDER — IOHEXOL 300 MG/ML  SOLN
80.0000 mL | Freq: Once | INTRAMUSCULAR | Status: AC | PRN
  Administered 2015-03-09: 80 mL via INTRAVENOUS

## 2015-03-13 ENCOUNTER — Inpatient Hospital Stay (HOSPITAL_COMMUNITY)
Admission: EM | Admit: 2015-03-13 | Discharge: 2015-03-15 | DRG: 392 | Disposition: A | Discharge status: Home / Self Care | Payer: Medicare Other | Attending: Internal Medicine | Admitting: Internal Medicine

## 2015-03-13 ENCOUNTER — Encounter (HOSPITAL_COMMUNITY): Payer: Self-pay | Admitting: *Deleted

## 2015-03-13 DIAGNOSIS — Z9842 Cataract extraction status, left eye: Secondary | ICD-10-CM | POA: Diagnosis not present

## 2015-03-13 DIAGNOSIS — K219 Gastro-esophageal reflux disease without esophagitis: Secondary | ICD-10-CM | POA: Diagnosis present

## 2015-03-13 DIAGNOSIS — N179 Acute kidney failure, unspecified: Secondary | ICD-10-CM | POA: Diagnosis present

## 2015-03-13 DIAGNOSIS — K5732 Diverticulitis of large intestine without perforation or abscess without bleeding: Secondary | ICD-10-CM | POA: Diagnosis present

## 2015-03-13 DIAGNOSIS — Z90710 Acquired absence of both cervix and uterus: Secondary | ICD-10-CM

## 2015-03-13 DIAGNOSIS — Z9841 Cataract extraction status, right eye: Secondary | ICD-10-CM

## 2015-03-13 DIAGNOSIS — R1030 Lower abdominal pain, unspecified: Secondary | ICD-10-CM

## 2015-03-13 DIAGNOSIS — R197 Diarrhea, unspecified: Secondary | ICD-10-CM | POA: Diagnosis not present

## 2015-03-13 DIAGNOSIS — K5792 Diverticulitis of intestine, part unspecified, without perforation or abscess without bleeding: Secondary | ICD-10-CM | POA: Diagnosis present

## 2015-03-13 DIAGNOSIS — D72829 Elevated white blood cell count, unspecified: Secondary | ICD-10-CM | POA: Diagnosis present

## 2015-03-13 DIAGNOSIS — E785 Hyperlipidemia, unspecified: Secondary | ICD-10-CM | POA: Diagnosis not present

## 2015-03-13 DIAGNOSIS — K5713 Diverticulitis of small intestine without perforation or abscess with bleeding: Secondary | ICD-10-CM | POA: Diagnosis not present

## 2015-03-13 DIAGNOSIS — K5712 Diverticulitis of small intestine without perforation or abscess without bleeding: Secondary | ICD-10-CM | POA: Diagnosis not present

## 2015-03-13 DIAGNOSIS — E860 Dehydration: Secondary | ICD-10-CM | POA: Diagnosis present

## 2015-03-13 DIAGNOSIS — Z87891 Personal history of nicotine dependence: Secondary | ICD-10-CM | POA: Diagnosis not present

## 2015-03-13 DIAGNOSIS — H3530 Unspecified macular degeneration: Secondary | ICD-10-CM | POA: Diagnosis present

## 2015-03-13 HISTORY — DX: Depression, unspecified: F32.A

## 2015-03-13 HISTORY — DX: Frequency of micturition: R35.0

## 2015-03-13 HISTORY — DX: Major depressive disorder, single episode, unspecified: F32.9

## 2015-03-13 LAB — COMPREHENSIVE METABOLIC PANEL
ALBUMIN: 3.5 g/dL (ref 3.5–5.0)
ALBUMIN: 3 g/dL — AB (ref 3.5–5.0)
ALK PHOS: 50 U/L (ref 38–126)
ALT: 14 U/L (ref 14–54)
ALT: 12 U/L — AB (ref 14–54)
ANION GAP: 10 (ref 5–15)
AST: 23 U/L (ref 15–41)
AST: 23 U/L (ref 15–41)
Alkaline Phosphatase: 41 U/L (ref 38–126)
Anion gap: 5 (ref 5–15)
BILIRUBIN TOTAL: 0.6 mg/dL (ref 0.3–1.2)
BILIRUBIN TOTAL: 0.6 mg/dL (ref 0.3–1.2)
BUN: 13 mg/dL (ref 6–20)
BUN: 8 mg/dL (ref 6–20)
CALCIUM: 9 mg/dL (ref 8.9–10.3)
CO2: 23 mmol/L (ref 22–32)
CO2: 24 mmol/L (ref 22–32)
CREATININE: 0.94 mg/dL (ref 0.44–1.00)
Calcium: 8 mg/dL — ABNORMAL LOW (ref 8.9–10.3)
Chloride: 104 mmol/L (ref 101–111)
Chloride: 109 mmol/L (ref 101–111)
Creatinine, Ser: 1.12 mg/dL — ABNORMAL HIGH (ref 0.44–1.00)
GFR calc non Af Amer: 54 mL/min — ABNORMAL LOW (ref >60)
GFR, EST AFRICAN AMERICAN: 51 mL/min — AB (ref >60)
GFR, EST NON AFRICAN AMERICAN: 44 mL/min — AB (ref >60)
GLUCOSE: 122 mg/dL — AB (ref 65–99)
GLUCOSE: 87 mg/dL (ref 65–99)
POTASSIUM: 4 mmol/L (ref 3.5–5.1)
POTASSIUM: 3.6 mmol/L (ref 3.5–5.1)
Sodium: 137 mmol/L (ref 135–145)
Sodium: 138 mmol/L (ref 135–145)
Total Protein: 6.7 g/dL (ref 6.5–8.1)
Total Protein: 5.5 g/dL — ABNORMAL LOW (ref 6.5–8.1)

## 2015-03-13 LAB — CBC WITH DIFFERENTIAL/PLATELET
Basophils Absolute: 0 10*3/uL (ref 0.0–0.1)
Basophils Absolute: 0 10*3/uL (ref 0.0–0.1)
Basophils Relative: 0 % (ref 0–1)
Basophils Relative: 0 % (ref 0–1)
EOS PCT: 2 % (ref 0–5)
Eosinophils Absolute: 0.2 10*3/uL (ref 0.0–0.7)
Eosinophils Absolute: 0.1 10*3/uL (ref 0.0–0.7)
Eosinophils Relative: 2 % (ref 0–5)
HCT: 35.6 % — ABNORMAL LOW (ref 36.0–46.0)
HEMATOCRIT: 38.4 % (ref 36.0–46.0)
HEMOGLOBIN: 13.1 g/dL (ref 12.0–15.0)
Hemoglobin: 11.7 g/dL — ABNORMAL LOW (ref 12.0–15.0)
LYMPHS ABS: 1.3 10*3/uL (ref 0.7–4.0)
Lymphocytes Relative: 10 % — ABNORMAL LOW (ref 12–46)
Lymphocytes Relative: 18 % (ref 12–46)
Lymphs Abs: 1.7 10*3/uL (ref 0.7–4.0)
MCH: 30.9 pg (ref 26.0–34.0)
MCH: 29.8 pg (ref 26.0–34.0)
MCHC: 34.1 g/dL (ref 30.0–36.0)
MCHC: 32.9 g/dL (ref 30.0–36.0)
MCV: 90.6 fL (ref 78.0–100.0)
MCV: 90.6 fL (ref 78.0–100.0)
MONOS PCT: 9 % (ref 3–12)
Monocytes Absolute: 1.1 10*3/uL — ABNORMAL HIGH (ref 0.1–1.0)
Monocytes Absolute: 0.7 10*3/uL (ref 0.1–1.0)
Monocytes Relative: 8 % (ref 3–12)
NEUTROS ABS: 10.5 10*3/uL — AB (ref 1.7–7.7)
NEUTROS ABS: 6.8 10*3/uL (ref 1.7–7.7)
NEUTROS PCT: 79 % — AB (ref 43–77)
Neutrophils Relative %: 72 % (ref 43–77)
PLATELETS: 283 10*3/uL (ref 150–400)
Platelets: 276 10*3/uL (ref 150–400)
RBC: 4.24 MIL/uL (ref 3.87–5.11)
RBC: 3.93 MIL/uL (ref 3.87–5.11)
RDW: 13.5 % (ref 11.5–15.5)
RDW: 13.5 % (ref 11.5–15.5)
WBC: 13.2 10*3/uL — ABNORMAL HIGH (ref 4.0–10.5)
WBC: 9.3 10*3/uL (ref 4.0–10.5)

## 2015-03-13 LAB — APTT: aPTT: 36 seconds (ref 24–37)

## 2015-03-13 LAB — LIPASE, BLOOD: Lipase: 31 U/L (ref 22–51)

## 2015-03-13 LAB — MAGNESIUM: Magnesium: 1.9 mg/dL (ref 1.7–2.4)

## 2015-03-13 LAB — URINALYSIS, ROUTINE W REFLEX MICROSCOPIC
Bilirubin Urine: NEGATIVE
GLUCOSE, UA: NEGATIVE mg/dL
HGB URINE DIPSTICK: NEGATIVE
KETONES UR: NEGATIVE mg/dL
Nitrite: NEGATIVE
PH: 6 (ref 5.0–8.0)
Protein, ur: NEGATIVE mg/dL
SPECIFIC GRAVITY, URINE: 1.011 (ref 1.005–1.030)
Urobilinogen, UA: 0.2 mg/dL (ref 0.0–1.0)

## 2015-03-13 LAB — PHOSPHORUS: Phosphorus: 2.4 mg/dL — ABNORMAL LOW (ref 2.5–4.6)

## 2015-03-13 LAB — TSH: TSH: 1.398 u[IU]/mL (ref 0.350–4.500)

## 2015-03-13 LAB — PROTIME-INR
INR: 1.24 (ref 0.00–1.49)
PROTHROMBIN TIME: 15.8 s — AB (ref 11.6–15.2)

## 2015-03-13 LAB — I-STAT CG4 LACTIC ACID, ED: LACTIC ACID, VENOUS: 0.66 mmol/L (ref 0.5–2.0)

## 2015-03-13 LAB — URINE MICROSCOPIC-ADD ON

## 2015-03-13 MED ORDER — METRONIDAZOLE IN NACL 5-0.79 MG/ML-% IV SOLN: 500.0000 mg | Freq: Once | INTRAVENOUS | Status: DC

## 2015-03-13 MED ORDER — DEXTROSE 5 % IV SOLN
1.0000 g | Freq: Once | INTRAVENOUS | Status: DC
  Filled 2015-03-13: qty 10

## 2015-03-13 MED ORDER — ACETAMINOPHEN 650 MG RE SUPP: 650.0000 mg | Freq: Four times a day (QID) | RECTAL | Status: DC | PRN

## 2015-03-13 MED ORDER — ACETAMINOPHEN 325 MG PO TABS: 650.0000 mg | ORAL_TABLET | Freq: Four times a day (QID) | ORAL | Status: DC | PRN

## 2015-03-13 MED ORDER — CIPROFLOXACIN IN D5W 400 MG/200ML IV SOLN: 400.0000 mg | Freq: Two times a day (BID) | INTRAVENOUS | Status: DC

## 2015-03-13 MED ORDER — ONDANSETRON HCL 4 MG PO TABS
4.0000 mg | ORAL_TABLET | Freq: Four times a day (QID) | ORAL | Status: DC | PRN
  Administered 2015-03-15: 4 mg via ORAL
  Filled 2015-03-13: qty 1

## 2015-03-13 MED ORDER — SODIUM CHLORIDE 0.9 % IV SOLN
INTRAVENOUS | Status: DC
  Administered 2015-03-13 – 2015-03-15 (×3): via INTRAVENOUS

## 2015-03-13 MED ORDER — CIPROFLOXACIN IN D5W 400 MG/200ML IV SOLN
400.0000 mg | Freq: Two times a day (BID) | INTRAVENOUS | Status: DC
  Administered 2015-03-14 – 2015-03-15 (×3): 400 mg via INTRAVENOUS
  Filled 2015-03-13 (×4): qty 200

## 2015-03-13 MED ORDER — METRONIDAZOLE IN NACL 5-0.79 MG/ML-% IV SOLN
500.0000 mg | Freq: Once | INTRAVENOUS | Status: AC
  Administered 2015-03-13: 500 mg via INTRAVENOUS
  Filled 2015-03-13: qty 100

## 2015-03-13 MED ORDER — METRONIDAZOLE IN NACL 5-0.79 MG/ML-% IV SOLN
500.0000 mg | Freq: Three times a day (TID) | INTRAVENOUS | Status: DC
  Administered 2015-03-13 – 2015-03-15 (×5): 500 mg via INTRAVENOUS
  Filled 2015-03-13 (×6): qty 100

## 2015-03-13 MED ORDER — SODIUM CHLORIDE 0.9 % IV BOLUS (SEPSIS)
1000.0000 mL | Freq: Once | INTRAVENOUS | Status: AC
  Administered 2015-03-13: 1000 mL via INTRAVENOUS

## 2015-03-13 MED ORDER — MORPHINE SULFATE 2 MG/ML IJ SOLN: 1.0000 mg | INTRAMUSCULAR | Status: DC | PRN

## 2015-03-13 MED ORDER — CIPROFLOXACIN IN D5W 400 MG/200ML IV SOLN: 400.0000 mg | Freq: Once | INTRAVENOUS | Status: DC

## 2015-03-13 MED ORDER — ONDANSETRON HCL 4 MG/2ML IJ SOLN
4.0000 mg | Freq: Four times a day (QID) | INTRAMUSCULAR | Status: DC | PRN
  Administered 2015-03-14 (×2): 4 mg via INTRAVENOUS
  Filled 2015-03-13 (×3): qty 2

## 2015-03-13 MED ORDER — OMEGA-3-ACID ETHYL ESTERS 1 G PO CAPS
1.0000 g | ORAL_CAPSULE | Freq: Two times a day (BID) | ORAL | Status: DC
Start: 2015-03-13 — End: 2015-03-15
  Administered 2015-03-13 – 2015-03-15 (×4): 1 g via ORAL
  Filled 2015-03-13 (×4): qty 1

## 2015-03-13 MED ORDER — CIPROFLOXACIN IN D5W 400 MG/200ML IV SOLN
400.0000 mg | Freq: Once | INTRAVENOUS | Status: AC
  Administered 2015-03-13: 400 mg via INTRAVENOUS
  Filled 2015-03-13: qty 200

## 2015-03-13 NOTE — H&P (Addendum)
[facility] History and Physical  Charlene [name] UMP:[id number] DOB: 04-Jun-1928 DOA: 03/13/2015  Referring physician: ER physician: Dr. Danne Baxter PCP: Precious Reel, MD  Chief Complaint: nausea, not feeling very well   HPI:  81 year old female with past medical history of dyslipidemia who presented to [facility] reporting she doesn't feel well, she has nausea but no vomiting. She was just recently diagnosed with diverticulitis at urgent care center about 5 days prior to this admission. She was apparently started on ciprofloxacin and Flagyl. She continued to experience lower abdominal cramping, nausea but no vomiting. She reported anytime she took Cipro and Flagyl she felt weak and did not feel that medications are working for her. She did not have associated blood in the stool. Her last bowel movement was the morning of this admission, loose stool. Prior to this she apparently had diarrhea but this seemed to have resolved at this point. No reports of fevers or chills. No reports of chest pain, shortness of breath, palpitations. No reports of lightheadedness or loss of consciousness.  In ED, patient was hemodynamically stable. Her blood work was significant for mild leukocytosis of 13.2 and mild elevation in creatinine of 1.12. Imaging studies were not done on this admission because she recently had CAT scan which was significant for diverticulitis. She was started on Cipro and Flagyl in ED and [facility] called for admission for treatment of diverticulitis due to failed outpatient treatment with PO antibiotics.    Assessment & Plan    Principal Problem:   Diverticulitis without abscess or perforation / Lower abdominal pain / Leukocytosis - Recently diagnosed diverticulitis. Patient was a Cipro and Flagyl outpatient but continues to feel nausea. - We will start IV Cipro and Flagyl. Continue supportive care with IV fluids and antiemetics as needed. - Imaging studies not done on this  admission because of recent CAT scan that showed diverticulitis. If her abdominal pain does not improve then will consider doing abdominal x-ray or repeat CT scan. - Continue pain management efforts - Diet nothing by mouth. Advance as patient tolerates.  Active Problems:   Dyslipidemia - May resume omega-3 supplementation    Acute renal failure - Likely prerenal etiology, nausea, dehydration - Continue IV fluids - Follow-up BMP tomorrow morning   DVT prophylaxis:  - SCD's bilaterally   Radiological Exams on Admission: No results found.  Code Status: Full Family Communication: Plan of care discussed with the patient  Disposition Plan: Admit for further evaluation, medical floor   Leisa Lenz, MD  [facility] Pager [phone]  Time spent in minutes: 55 minutes  Review of Systems:  Constitutional: Negative for fever, chills and malaise/fatigue. Negative for diaphoresis.  HENT: Negative for hearing loss, ear pain, nosebleeds, congestion, sore throat, neck pain, tinnitus and ear discharge.   Eyes: Negative for blurred vision, double vision, photophobia, pain, discharge and redness.  Respiratory: Negative for cough, hemoptysis, sputum production, shortness of breath, wheezing and stridor.   Cardiovascular: Negative for chest pain, palpitations, orthopnea, claudication and leg swelling.  Gastrointestinal: positive for nausea, no vomiting. Negative for heartburn, constipation, blood in stool and melena.  Genitourinary: Negative for dysuria, urgency, frequency, hematuria and flank pain.  Musculoskeletal: Negative for myalgias, back pain, joint pain and falls.  Skin: Negative for itching and rash.  Neurological: Negative for dizziness and weakness. Negative for tingling, tremors, sensory change, speech change, focal weakness, loss of consciousness and headaches.  Endo/Heme/Allergies: Negative for environmental allergies and polydipsia. Does not bruise/bleed easily.   Psychiatric/Behavioral:  Negative for suicidal ideas. The patient is not nervous/anxious.      Past Medical History  Diagnosis Date  . Macular degeneration   . GERD (gastroesophageal reflux disease)   . Allergy     seasonal  . BPPV (benign paroxysmal positional vertigo) 2012   Past Surgical History  Procedure Laterality Date  . Abdominal hysterectomy    . Eye surgery      cataract removal bilateral 2012   Social History:  reports that she quit smoking about 42 years ago. She does not have any smokeless tobacco history on file. She reports that she drinks alcohol. She reports that she does not use illicit drugs.  No Known Allergies  Family History:  Family History  Problem Relation Age of Onset  . Heart disease Mother   . Stroke Father   . Cancer Brother     lung cancer  . Heart disease Brother      Prior to Admission medications   Medication Sig Start Date End Date Taking? Authorizing Provider  ciprofloxacin (CIPRO) 500 MG tablet Take 500 mg by mouth 2 (two) times daily.   Yes Historical Provider, MD  ibuprofen (ADVIL,MOTRIN) 200 MG tablet Take 200 mg by mouth every 6 (six) hours as needed. For pain.   Yes Historical Provider, MD  metroNIDAZOLE (FLAGYL) 500 MG tablet Take 500 mg by mouth 4 (four) times daily.   Yes Historical Provider, MD  Multiple Vitamins-Minerals (PRESERVISION AREDS PO) Take 1 tablet by mouth 2 (two) times daily.   Yes Historical Provider, MD  omega-3 acid ethyl esters (LOVAZA) 1 G capsule Take by mouth 2 (two) times daily.   Yes Historical Provider, MD   Physical Exam: Filed Vitals:   03/13/15 1130 03/13/15 1143 03/13/15 1145 03/13/15 1200  BP: 136/52 136/52 130/43   Pulse: 56 56 54   Temp:      TempSrc:      Resp:  20    Height:    5' 4.17" (1.63 m)  Weight:    72.6 kg (160 lb 0.9 oz)  SpO2: 95% 97% 98%     Physical Exam  Constitutional: Appears well-developed and well-nourished. No distress.  HENT: Normocephalic. No tonsillar erythema or  exudates Eyes: Conjunctivae are normal. No scleral icterus.  Neck: Normal ROM. Neck supple. No JVD. No tracheal deviation. No thyromegaly.  CVS: RRR, S1/S2 +, no murmurs, no gallops, no carotid bruit.  Pulmonary: Effort and breath sounds normal, no stridor, rhonchi, wheezes, rales.  Abdominal: Soft. BS +,  no distension, tenderness in lower abdomen without rebound or guarding.  Musculoskeletal: Normal range of motion. No edema and no tenderness.  Lymphadenopathy: No lymphadenopathy noted, cervical, inguinal. Neuro: Alert. Normal reflexes, muscle tone coordination. No focal neurologic deficits. Skin: Skin is warm and dry. No rash noted.  No erythema. No pallor.  Psychiatric: Normal mood and affect. Behavior, judgment, thought content normal.   Labs on Admission:  Basic Metabolic Panel:  Recent Labs Lab 03/09/15 1343 03/13/15 1030  NA  --  137  K  --  4.0  CL  --  104  CO2  --  23  GLUCOSE  --  122*  BUN  --  13  CREATININE 1.07* 1.12*  CALCIUM  --  9.0   Liver Function Tests:  Recent Labs Lab 03/13/15 1030  AST 23  ALT 14  ALKPHOS 50  BILITOT 0.6  PROT 6.7  ALBUMIN 3.5    Recent Labs Lab 03/13/15 1030  LIPASE 31  No results for input(s): AMMONIA in the last 168 hours. CBC:  Recent Labs Lab 03/13/15 1030  WBC 13.2*  NEUTROABS 10.5*  HGB 13.1  HCT 38.4  MCV 90.6  PLT 283   Cardiac Enzymes: No results for input(s): CKTOTAL, CKMB, CKMBINDEX, TROPONINI in the last 168 hours. BNP: Invalid input(s): POCBNP CBG: No results for input(s): GLUCAP in the last 168 hours.  If 7PM-7AM, please contact night-coverage [email or web address] Password [email or web address] 03/13/2015, 1:04 PM

## 2015-03-13 NOTE — ED Notes (Signed)
Pt reports being treated for diverticulitis since Saturday, states medications not helping. Continues to have nausea, diarrhea, abdominal pain.

## 2015-03-13 NOTE — Progress Notes (Signed)
ANTIBIOTIC CONSULT NOTE - INITIAL  Pharmacy Consult: Cipro Indication:  Intra-abdominal infection  No Known Allergies  Patient Measurements: Height: 5' 4.17" (163 cm) Weight: 160 lb 0.9 oz (72.6 kg) IBW/kg (Calculated) : 55.1  Vital Signs: Temp: 98.5 F (36.9 C) (06/08 1005) Temp Source: Oral (06/08 1005) BP: 130/43 mmHg (06/08 1145) Pulse Rate: 54 (06/08 1145)  Labs:  Recent Labs  03/13/15 1030  WBC 13.2*  HGB 13.1  PLT 283  CREATININE 1.12*   Estimated Creatinine Clearance: 36.7 mL/min (by C-G formula based on Cr of 1.12). No results for input(s): VANCOTROUGH, VANCOPEAK, VANCORANDOM, GENTTROUGH, GENTPEAK, GENTRANDOM, TOBRATROUGH, TOBRAPEAK, TOBRARND, AMIKACINPEAK, AMIKACINTROU, AMIKACIN in the last 72 hours.   Microbiology: No results found for this or any previous visit (from the past 720 hour(s)).  Medical History: Past Medical History  Diagnosis Date  . Macular degeneration   . GERD (gastroesophageal reflux disease)   . Allergy     seasonal  . BPPV (benign paroxysmal positional vertigo) 2012     Assessment: 35 YOF with diverticulitis presented with nausea, diarrhea, and abdominal pain.  Pharmacy consulted to resumed Cipro for intra-abdominal infection.  Baseline labs reviewed.  Cipro PTA (started ?6/4) >> Flagyl PTA (started ?6/4) >>   Goal of Therapy:  Resolution of infection   Plan:  - Cipro 400mg  IV Q12H - Monitor renal fxn, clinical progress - F/U with order to continue Flagyl    Penina Reisner D. Mina Marble, PharmD, BCPS Pager:  [phone] 03/13/2015, 12:35 PM

## 2015-03-13 NOTE — ED Provider Notes (Signed)
CSN: [id number]     Arrival date & time 03/13/15  0932 History   First MD Initiated Contact with Patient 03/13/15 1016     Chief Complaint  Patient presents with  . Abdominal Pain  . Nausea     (Consider location/radiation/quality/duration/timing/severity/associated sxs/prior Treatment) The history is provided by the patient. No language interpreter was used.  Charlene [name] is an 81 y/o F with PMHx of macular degeneration, GERD presenting to the ED with abdominal pain, nausea, and diarrhea. Patient reported that she was seen and assessed at [facility] 5 days ago where she was diagnosed with diverticulitis and started on PO antibiotics. Patient reported that her symptoms have not gotten any better. Stated that she continues to have abdominal pain and continues to have nausea and diarrhea. Reported decrease in appetite, reported that she has not been drinking much either. Reported that she continues to have a cramping sensation in her left lower quadrant. Denied hematochezia, melena, vomiting, fever, chills, headache, dizziness, chest pain, shortness of breath, difficulty breathing, neck pain, neck stiffness, back pain. PCP Dr. Virgina Jock  Past Medical History  Diagnosis Date  . Macular degeneration   . GERD (gastroesophageal reflux disease)   . Allergy     seasonal  . BPPV (benign paroxysmal positional vertigo) 2012   Past Surgical History  Procedure Laterality Date  . Abdominal hysterectomy    . Eye surgery      cataract removal bilateral 2012   Family History  Problem Relation Age of Onset  . Heart disease Mother   . Stroke Father   . Cancer Brother     lung cancer  . Heart disease Brother    History  Substance Use Topics  . Smoking status: Former Smoker    Quit date: 01/19/1973  . Smokeless tobacco: Not on file  . Alcohol Use: Yes     Comment: occ   OB History    No data available     Review of Systems  Constitutional: Negative for fever and chills.  Respiratory:  Negative for chest tightness and shortness of breath.   Cardiovascular: Negative for chest pain.  Gastrointestinal: Positive for nausea, abdominal pain and diarrhea. Negative for vomiting, constipation, blood in stool and anal bleeding.  Genitourinary: Negative for dysuria and decreased urine volume.  Musculoskeletal: Negative for back pain, neck pain and neck stiffness.  Neurological: Negative for dizziness, weakness and headaches.      Allergies  Review of patient's allergies indicates no known allergies.  Home Medications   Prior to Admission medications   Medication Sig Start Date End Date Taking? Authorizing Provider  ciprofloxacin (CIPRO) 500 MG tablet Take 500 mg by mouth 2 (two) times daily.   Yes Historical Provider, MD  ibuprofen (ADVIL,MOTRIN) 200 MG tablet Take 200 mg by mouth every 6 (six) hours as needed. For pain.   Yes Historical Provider, MD  metroNIDAZOLE (FLAGYL) 500 MG tablet Take 500 mg by mouth 4 (four) times daily.   Yes Historical Provider, MD  Multiple Vitamins-Minerals (PRESERVISION AREDS PO) Take 1 tablet by mouth 2 (two) times daily.   Yes Historical Provider, MD  omega-3 acid ethyl esters (LOVAZA) 1 G capsule Take by mouth 2 (two) times daily.   Yes Historical Provider, MD   BP 130/43 mmHg  Pulse 54  Temp(Src) 98.5 F (36.9 C) (Oral)  Resp 20  Ht 5' 4.17" (1.63 m)  Wt 160 lb 0.9 oz (72.6 kg)  BMI 27.33 kg/m2  SpO2 98% Physical Exam  Constitutional: She is oriented to person, place, and time. She appears well-developed and well-nourished. No distress.  HENT:  Head: Normocephalic and atraumatic.  Mouth/Throat: Oropharynx is clear and moist. No oropharyngeal exudate.  Eyes: Conjunctivae and EOM are normal. Pupils are equal, round, and reactive to light. Right eye exhibits no discharge. Left eye exhibits no discharge.  Neck: Normal range of motion. Neck supple.  Cardiovascular: Normal rate, regular rhythm and normal heart sounds.   Pulses:      Radial  pulses are 2+ on the right side, and 2+ on the left side.       Dorsalis pedis pulses are 2+ on the right side, and 2+ on the left side.  Pulmonary/Chest: Effort normal and breath sounds normal. No respiratory distress. She has no wheezes. She has no rales.  Abdominal: Soft. Bowel sounds are normal. She exhibits no distension. There is tenderness in the left lower quadrant. There is no rebound and no guarding.  Musculoskeletal: Normal range of motion.  Neurological: She is alert and oriented to person, place, and time. No cranial nerve deficit. She exhibits normal muscle tone. Coordination normal. GCS eye subscore is 4. GCS verbal subscore is 5. GCS motor subscore is 6.  Skin: Skin is warm and dry. No rash noted. She is not diaphoretic. No erythema.  Psychiatric: She has a normal mood and affect. Her behavior is normal. Thought content normal.  Nursing note and vitals reviewed.   ED Course  Procedures (including critical care time)  Results for orders placed or performed during the hospital encounter of 03/13/15  Comprehensive metabolic panel  Result Value Ref Range   Sodium 137 135 - 145 mmol/L   Potassium 4.0 3.5 - 5.1 mmol/L   Chloride 104 101 - 111 mmol/L   CO2 23 22 - 32 mmol/L   Glucose, Bld 122 (H) 65 - 99 mg/dL   BUN 13 6 - 20 mg/dL   Creatinine, Ser 1.12 (H) 0.44 - 1.00 mg/dL   Calcium 9.0 8.9 - 10.3 mg/dL   Total Protein 6.7 6.5 - 8.1 g/dL   Albumin 3.5 3.5 - 5.0 g/dL   AST 23 15 - 41 U/L   ALT 14 14 - 54 U/L   Alkaline Phosphatase 50 38 - 126 U/L   Total Bilirubin 0.6 0.3 - 1.2 mg/dL   GFR calc non Af Amer 44 (L) >60 mL/min   GFR calc Af Amer 51 (L) >60 mL/min   Anion gap 10 5 - 15  CBC WITH DIFFERENTIAL  Result Value Ref Range   WBC 13.2 (H) 4.0 - 10.5 K/uL   RBC 4.24 3.87 - 5.11 MIL/uL   Hemoglobin 13.1 12.0 - 15.0 g/dL   HCT 38.4 36.0 - 46.0 %   MCV 90.6 78.0 - 100.0 fL   MCH 30.9 26.0 - 34.0 pg   MCHC 34.1 30.0 - 36.0 g/dL   RDW 13.5 11.5 - 15.5 %    Platelets 283 150 - 400 K/uL   Neutrophils Relative % 79 (H) 43 - 77 %   Neutro Abs 10.5 (H) 1.7 - 7.7 K/uL   Lymphocytes Relative 10 (L) 12 - 46 %   Lymphs Abs 1.3 0.7 - 4.0 K/uL   Monocytes Relative 9 3 - 12 %   Monocytes Absolute 1.1 (H) 0.1 - 1.0 K/uL   Eosinophils Relative 2 0 - 5 %   Eosinophils Absolute 0.2 0.0 - 0.7 K/uL   Basophils Relative 0 0 - 1 %   Basophils Absolute  0.0 0.0 - 0.1 K/uL  Lipase, blood  Result Value Ref Range   Lipase 31 22 - 51 U/L  Urinalysis, Routine w reflex microscopic (not at [facility])  Result Value Ref Range   Color, Urine YELLOW YELLOW   APPearance CLEAR CLEAR   Specific Gravity, Urine 1.011 1.005 - 1.030   pH 6.0 5.0 - 8.0   Glucose, UA NEGATIVE NEGATIVE mg/dL   Hgb urine dipstick NEGATIVE NEGATIVE   Bilirubin Urine NEGATIVE NEGATIVE   Ketones, ur NEGATIVE NEGATIVE mg/dL   Protein, ur NEGATIVE NEGATIVE mg/dL   Urobilinogen, UA 0.2 0.0 - 1.0 mg/dL   Nitrite NEGATIVE NEGATIVE   Leukocytes, UA TRACE (A) NEGATIVE  Urine microscopic-add on  Result Value Ref Range   Squamous Epithelial / LPF RARE RARE   WBC, UA 0-2 <3 WBC/hpf   Bacteria, UA RARE RARE  I-Stat CG4 Lactic Acid, ED  Result Value Ref Range   Lactic Acid, Venous 0.66 0.5 - 2.0 mmol/L    Study Result     CLINICAL DATA: Lower abdominal pain for 5 days. Pain is more severe today. Personal history of diverticulitis.  EXAM: CT ABDOMEN AND PELVIS WITH CONTRAST  TECHNIQUE: Multidetector CT imaging of the abdomen and pelvis was performed using the standard protocol following bolus administration of intravenous contrast.  CONTRAST: 41mL OMNIPAQUE IOHEXOL 300 MG/ML SOLN  COMPARISON: None.  FINDINGS: Scarring are atelectasis is seen posteriorly in the lingula. The lung bases are otherwise clear. The heart size is normal. A moderate-sized hiatal hernia is present. No significant pleural or pericardial effusion is present.  The liver and spleen are within normal limits.  The remainder of the stomach is within normal limits is well. The duodenum and pancreas are unremarkable. The common bile duct is within normal limits for age. Small layering stones are present at the fundus the gallbladder. There is no inflammation about the gallbladder. The adrenal glands are normal bilaterally. A simple cyst of the left kidney measures 2.5 cm. A simple cyst in the right kidney measures 0.9 cm. Kidneys and ureters are otherwise within normal limits. Urinary bladder is unremarkable.  Inflammatory changes are present about the sigmoid colon with extensive diverticula. There is no evidence for free air or abscess. Additional diverticular present in the more proximal sigmoid and descending colon without other inflammatory change. Contrast can be seen to the level of the splenic flexure. The more proximal colon is normal. The appendix is visualized and normal. The small bowel is unremarkable. Atherosclerotic changes are present in the aorta and branch vessels.  The patient is status post hysterectomy. The ovaries are not visualized and may be surgically absent as well. Retroperitoneal surgical clips are present bilaterally compatible with nodal dissection. An inguinal hernia is present on the left containing fat.  Rightward curvature of the lumbar spine is centered at L4-5. There is leftward curvature of thoracolumbar junction. Asymmetric degenerative endplate changes are present on the right.  IMPRESSION: 1. Sigmoid diverticulitis without complicating features of abscess or free air. 2. Layering gallstones without cholecystitis. 3. Extensive atherosclerosis without aneurysm. 4. Bilateral renal cysts.   Electronically Signed  By: San Morelle M.D.  On: 03/09/2015 16:33    Labs Review Labs Reviewed  COMPREHENSIVE METABOLIC PANEL - Abnormal; Notable for the following:    Glucose, Bld 122 (*)    Creatinine, Ser 1.12 (*)    GFR calc non Af Amer  44 (*)    GFR calc Af Amer 51 (*)    All other components  within normal limits  CBC WITH DIFFERENTIAL/PLATELET - Abnormal; Notable for the following:    WBC 13.2 (*)    Neutrophils Relative % 79 (*)    Neutro Abs 10.5 (*)    Lymphocytes Relative 10 (*)    Monocytes Absolute 1.1 (*)    All other components within normal limits  URINALYSIS, ROUTINE W REFLEX MICROSCOPIC (NOT AT [facility]) - Abnormal; Notable for the following:    Leukocytes, UA TRACE (*)    All other components within normal limits  LIPASE, BLOOD  URINE MICROSCOPIC-ADD ON  I-STAT CG4 LACTIC ACID, ED    Imaging Review No results found.   EKG Interpretation None       12:32 PM This provider spoke with Dr. Neil Crouch - [facility]. Discussed case, labs, imaging, ED course in great detail. Patient to be admitted to [place] floor.  12:41 PM Dr. Eulis Foster instructed to give patient IV Flagyl and Rocephin since patient is not reacting well to ciprofloxacin Flagyl combination.  MDM   Final diagnoses:  Diverticulitis of intestine without perforation or abscess without bleeding  Diarrhea    Medications  sodium chloride 0.9 % bolus 1,000 mL (not administered)  metroNIDAZOLE (FLAGYL) IVPB 500 mg (not administered)  cefTRIAXone (ROCEPHIN) 1 g in dextrose 5 % 50 mL IVPB (not administered)  sodium chloride 0.9 % bolus 1,000 mL (1,000 mLs Intravenous New Bag/Given 03/13/15 1143)    Filed Vitals:   03/13/15 1130 03/13/15 1143 03/13/15 1145 03/13/15 1200  BP: 136/52 136/52 130/43   Pulse: 56 56 54   Temp:      TempSrc:      Resp:  20    Height:    5' 4.17" (1.63 m)  Weight:    160 lb 0.9 oz (72.6 kg)  SpO2: 95% 97% 98%    This provider reviewed the patient's chart. Patient is CT abdomen and pelvis with contrast performed on 03/09/2015 that identified sigmoid diverticulitis without abscess or perforation. CBC noted mildly elevated leukocytosis of 13.2. Hemoglobin 13.1, hematocrit 38.4. CMP noted mildly elevated creatinine  of 1.12 - when compared to 4 days ago patient's creatinine was 1.07, mild increase. Lipase negative elevation. Lactic acid negative elevation. Urinalysis negative for hemoglobin, nitrites-trace of leukocytes identified with negative elevated white blood cell count. Patient presenting to the ED with no change abdominal pain, nausea, diarrhea since being diagnosed with sigmoid diverticulitis approximate 5 days ago. Patient has been taking by mouth into by a as prescribed without change-reported that she's been having decreased appetite, not drinking or eating. Patient failing outpatient treatment. Patient has mildly increased creatinine of 1.12. Patient placed on IV fluids and IV antiemetics in ED setting. Patient will be admitted for diverticulitis. Discussed plan for admission with patient who agrees to plan of care. Patient stable for transfer to floor.  Jamse Mead, PA-C 03/13/15 1237  Jamse Mead, PA-C 03/13/15 1242  Baylee Mccorkel, PA-C 03/13/15 1244  Daleen Bo, MD 03/18/15 1952

## 2015-03-13 NOTE — ED Notes (Signed)
Attempted report Xs 1.  

## 2015-03-13 NOTE — ED Provider Notes (Signed)
  Face-to-face evaluation   History: She presents for evaluation of abdominal cramping, nausea, explosive diarrhea and ill feeling for several days. She was diagnosed and treated for diverticulitis, 5 days ago. She reports no improvement yet. She has a remote history of diverticulitis. No prior colonoscopy. No prior abdominal surgery.  Physical exam: Alert, calm, elderly female. Heart regular rate and rhythm, no murmur. Lungs clear anteriorly. Abdomen normal bowel sounds, soft, mild suprapubic tenderness. No right upper quadrant tenderness or mass.  Medical screening examination/treatment/procedure(s) were conducted as a shared visit with non-physician practitioner(s) and myself.  I personally evaluated the patient during the encounter  Daleen Bo, MD 03/18/15 [phone]

## 2015-03-14 DIAGNOSIS — K5713 Diverticulitis of small intestine without perforation or abscess with bleeding: Secondary | ICD-10-CM

## 2015-03-14 DIAGNOSIS — D72829 Elevated white blood cell count, unspecified: Secondary | ICD-10-CM

## 2015-03-14 DIAGNOSIS — N179 Acute kidney failure, unspecified: Secondary | ICD-10-CM

## 2015-03-14 LAB — GLUCOSE, CAPILLARY: Glucose-Capillary: 83 mg/dL (ref 65–99)

## 2015-03-14 LAB — BASIC METABOLIC PANEL
Anion gap: 7 (ref 5–15)
BUN: 8 mg/dL (ref 6–20)
CALCIUM: 8.1 mg/dL — AB (ref 8.9–10.3)
CHLORIDE: 110 mmol/L (ref 101–111)
CO2: 22 mmol/L (ref 22–32)
Creatinine, Ser: 0.92 mg/dL (ref 0.44–1.00)
GFR calc non Af Amer: 56 mL/min — ABNORMAL LOW (ref >60)
Glucose, Bld: 102 mg/dL — ABNORMAL HIGH (ref 65–99)
POTASSIUM: 3.6 mmol/L (ref 3.5–5.1)
Sodium: 139 mmol/L (ref 135–145)

## 2015-03-14 LAB — CBC
HCT: 34.5 % — ABNORMAL LOW (ref 36.0–46.0)
Hemoglobin: 11.6 g/dL — ABNORMAL LOW (ref 12.0–15.0)
MCH: 30.5 pg (ref 26.0–34.0)
MCHC: 33.6 g/dL (ref 30.0–36.0)
MCV: 90.8 fL (ref 78.0–100.0)
Platelets: 266 10*3/uL (ref 150–400)
RBC: 3.8 MIL/uL — ABNORMAL LOW (ref 3.87–5.11)
RDW: 13.7 % (ref 11.5–15.5)
WBC: 8.2 10*3/uL (ref 4.0–10.5)

## 2015-03-14 NOTE — Progress Notes (Signed)
[facility] PROGRESS NOTE  Charlene [name] [id number]:[id number] DOB: May 03, 1928 DOA: 03/13/2015 PCP: Precious Reel, MD  Assessment/Plan: 1-Acute diverticulitis;  Continue with IV ciprofloxacin and Flagy.  WBC trending down.  Feels can try food. Will start diet.  Will need to make sure patient will be able to tolerates oral antibiotics prior to discharge.  Zofran for nausea as needed.   Dyslipidemia - continue with  omega-3 supplementation   Acute renal failure - Likely prerenal etiology, nausea, dehydration - Continue IV fluids -renal function improved.    Code Status: Full Code.  Family Communication: care discussed with pateint Disposition Plan: Remain inpatient   Consultants:  none  Procedures:  none  Antibiotics:  Ciprofloxacin  Flagyl.   HPI/Subjective: Feeling better, wants to try food. ' Abdominal pain better  Objective: Filed Vitals:   03/14/15 0710  BP: 113/44  Pulse: 53  Temp: 97.9 F (36.6 C)  Resp: 20    Intake/Output Summary (Last 24 hours) at 03/14/15 1127 Last data filed at 03/14/15 0900  Gross per 24 hour  Intake 1213.75 ml  Output      0 ml  Net 1213.75 ml   Filed Weights   03/13/15 1200 03/13/15 1721  Weight: 72.6 kg (160 lb 0.9 oz) 71.215 kg (157 lb)    Exam:   General:  NAD  Cardiovascular: S 1, S 2 RRR  Respiratory: CTA  Abdomen: BS present, soft, nt  Musculoskeletal: no edema   Data Reviewed: Basic Metabolic Panel:  Recent Labs Lab 03/09/15 1343 03/13/15 1030 03/13/15 1853 03/14/15 0325  NA  --  137 138 139  K  --  4.0 3.6 3.6  CL  --  104 109 110  CO2  --  23 24 22   GLUCOSE  --  122* 87 102*  BUN  --  13 8 8   CREATININE 1.07* 1.12* 0.94 0.92  CALCIUM  --  9.0 8.0* 8.1*  MG  --   --  1.9  --   PHOS  --   --  2.4*  --    Liver Function Tests:  Recent Labs Lab 03/13/15 1030 03/13/15 1853  AST 23 23  ALT 14 12*  ALKPHOS 50 41  BILITOT 0.6 0.6  PROT 6.7 5.5*  ALBUMIN 3.5 3.0*    Recent  Labs Lab 03/13/15 1030  LIPASE 31   No results for input(s): AMMONIA in the last 168 hours. CBC:  Recent Labs Lab 03/13/15 1030 03/13/15 1853 03/14/15 0325  WBC 13.2* 9.3 8.2  NEUTROABS 10.5* 6.8  --   HGB 13.1 11.7* 11.6*  HCT 38.4 35.6* 34.5*  MCV 90.6 90.6 90.8  PLT 283 276 266   Cardiac Enzymes: No results for input(s): CKTOTAL, CKMB, CKMBINDEX, TROPONINI in the last 168 hours. BNP (last 3 results) No results for input(s): BNP in the last 8760 hours.  ProBNP (last 3 results) No results for input(s): PROBNP in the last 8760 hours.  CBG: No results for input(s): GLUCAP in the last 168 hours.  No results found for this or any previous visit (from the past 240 hour(s)).   Studies: No results found.  Scheduled Meds: . ciprofloxacin  400 mg Intravenous Q12H  . metronidazole  500 mg Intravenous Q8H  . omega-3 acid ethyl esters  1 g Oral BID   Continuous Infusions: . sodium chloride 75 mL/hr at 03/13/15 1825    Principal Problem:   Diverticulitis Active Problems:   Dyslipidemia   Lower abdominal pain   Leukocytosis  Acute renal failure    Time spent: 35 minutes.     Niel Hummer A  [facility] Pager [phone]. If 7PM-7AM, please contact night-coverage at [email or web address], password [facility] 03/14/2015, 11:27 AM  LOS: 1 day

## 2015-03-15 DIAGNOSIS — K5712 Diverticulitis of small intestine without perforation or abscess without bleeding: Secondary | ICD-10-CM

## 2015-03-15 LAB — BASIC METABOLIC PANEL
Anion gap: 5 (ref 5–15)
BUN: 6 mg/dL (ref 6–20)
CHLORIDE: 112 mmol/L — AB (ref 101–111)
CO2: 23 mmol/L (ref 22–32)
Calcium: 7.8 mg/dL — ABNORMAL LOW (ref 8.9–10.3)
Creatinine, Ser: 0.94 mg/dL (ref 0.44–1.00)
GFR calc Af Amer: >60 mL/min (ref >60)
GFR calc non Af Amer: 54 mL/min — ABNORMAL LOW (ref >60)
GLUCOSE: 117 mg/dL — AB (ref 65–99)
Potassium: 3.8 mmol/L (ref 3.5–5.1)
Sodium: 140 mmol/L (ref 135–145)

## 2015-03-15 LAB — CBC
HEMATOCRIT: 32.9 % — AB (ref 36.0–46.0)
Hemoglobin: 10.8 g/dL — ABNORMAL LOW (ref 12.0–15.0)
MCH: 30 pg (ref 26.0–34.0)
MCHC: 32.8 g/dL (ref 30.0–36.0)
MCV: 91.4 fL (ref 78.0–100.0)
PLATELETS: 248 10*3/uL (ref 150–400)
RBC: 3.6 MIL/uL — ABNORMAL LOW (ref 3.87–5.11)
RDW: 13.5 % (ref 11.5–15.5)
WBC: 7.2 10*3/uL (ref 4.0–10.5)

## 2015-03-15 LAB — GLUCOSE, CAPILLARY: Glucose-Capillary: 89 mg/dL (ref 65–99)

## 2015-03-15 MED ORDER — CIPROFLOXACIN HCL 500 MG PO TABS
500.0000 mg | ORAL_TABLET | Freq: Two times a day (BID) | ORAL | Status: DC
  Administered 2015-03-15: 500 mg via ORAL
  Filled 2015-03-15: qty 1

## 2015-03-15 MED ORDER — METRONIDAZOLE 500 MG PO TABS
500.0000 mg | ORAL_TABLET | Freq: Three times a day (TID) | ORAL | Status: DC
  Administered 2015-03-15 (×2): 500 mg via ORAL
  Filled 2015-03-15 (×2): qty 1

## 2015-03-15 MED ORDER — ONDANSETRON HCL 4 MG PO TABS: 4.0000 mg | ORAL_TABLET | Freq: Four times a day (QID) | ORAL | Status: DC | PRN

## 2015-03-15 NOTE — Discharge Summary (Signed)
Physician Discharge Summary  Charlene [name] [id number]:[id number] DOB: May 14, 1928 DOA: 03/13/2015  PCP: Precious Reel, MD  Admit date: 03/13/2015 Discharge date: 03/15/2015  Time spent: 35 minutes  Recommendations for Outpatient Follow-up:  1. Cbc to follow hb and WBC 2. B-met to follow renal function  Discharge Diagnoses:    Diverticulitis   Dyslipidemia   Lower abdominal pain   Leukocytosis   Acute renal failure   Discharge Condition: Stable.   Diet recommendation: low fat   Filed Weights   03/13/15 1200 03/13/15 1721 03/15/15 0400  Weight: 72.6 kg (160 lb 0.9 oz) 71.215 kg (157 lb) 71.9 kg (158 lb 8.2 oz)    History of present illness:  81 year old female with past medical history of dyslipidemia who presented to [facility] reporting she doesn't feel well, she has nausea but no vomiting. She was just recently diagnosed with diverticulitis at urgent care center about 5 days prior to this admission. She was apparently started on ciprofloxacin and Flagyl. She continued to experience lower abdominal cramping, nausea but no vomiting. She reported anytime she took Cipro and Flagyl she felt weak and did not feel that medications are working for her. She did not have associated blood in the stool. Her last bowel movement was the morning of this admission, loose stool. Prior to this she apparently had diarrhea but this seemed to have resolved at this point. No reports of fevers or chills. No reports of chest pain, shortness of breath, palpitations. No reports of lightheadedness or loss of consciousness.  In ED, patient was hemodynamically stable. Her blood work was significant for mild leukocytosis of 13.2 and mild elevation in creatinine of 1.12. Imaging studies were not done on this admission because she recently had CAT scan which was significant for diverticulitis. She was started on Cipro and Flagyl in ED and [facility] called for admission for treatment of diverticulitis due to failed outpatient  treatment with PO antibiotics  Hospital Course:  1-Acute diverticulitis;  Received  IV ciprofloxacin and Flagyl for 2 days.  WBC trending down.  Tolerating diet. Discharge home on cipro and flagyl.Marland Kitchen  Zofran for nausea as needed.   Dyslipidemia - continue with omega-3 supplementation   Acute renal failure - Likely prerenal etiology, nausea, dehydration - treated with  IV fluids -renal function improved.   Procedures: none Consultations:  none  Discharge Exam: Filed Vitals:   03/15/15 0400  BP: 129/48  Pulse: 51  Temp: 98.3 F (36.8 C)  Resp: 16    General: NAD Cardiovascular: S 1, S 2 RRR Respiratory: CTA Abdomen; soft, nt  Discharge Instructions   Discharge Instructions    Diet - low sodium heart healthy    Complete by:  As directed      Increase activity slowly    Complete by:  As directed           Current Discharge Medication List    START taking these medications   Details  ondansetron (ZOFRAN) 4 MG tablet Take 1 tablet (4 mg total) by mouth every 6 (six) hours as needed for nausea. Qty: 20 tablet, Refills: 0      CONTINUE these medications which have NOT CHANGED   Details  ciprofloxacin (CIPRO) 500 MG tablet Take 500 mg by mouth 2 (two) times daily.    metroNIDAZOLE (FLAGYL) 500 MG tablet Take 500 mg by mouth 4 (four) times daily.    Multiple Vitamins-Minerals (PRESERVISION AREDS PO) Take 1 tablet by mouth 2 (two) times daily.  omega-3 acid ethyl esters (LOVAZA) 1 G capsule Take by mouth 2 (two) times daily.      STOP taking these medications     ibuprofen (ADVIL,MOTRIN) 200 MG tablet        No Known Allergies Follow-up Information    Follow up with Precious Reel, MD In 1 week.   Specialty:  Internal Medicine   Contact information:   [place] [id number] [phone]        The results of significant diagnostics from this hospitalization (including imaging, microbiology, ancillary and laboratory) are  listed below for reference.    Significant Diagnostic Studies: Ct Abdomen Pelvis W Contrast  03/09/2015   CLINICAL DATA:  Lower abdominal pain for 5 days. Pain is more severe today. Personal history of diverticulitis.  EXAM: CT ABDOMEN AND PELVIS WITH CONTRAST  TECHNIQUE: Multidetector CT imaging of the abdomen and pelvis was performed using the standard protocol following bolus administration of intravenous contrast.  CONTRAST:  48m OMNIPAQUE IOHEXOL 300 MG/ML  SOLN  COMPARISON:  None.  FINDINGS: Scarring are atelectasis is seen posteriorly in the lingula. The lung bases are otherwise clear. The heart size is normal. A moderate-sized hiatal hernia is present. No significant pleural or pericardial effusion is present.  The liver and spleen are within normal limits. The remainder of the stomach is within normal limits is well. The duodenum and pancreas are unremarkable. The common bile duct is within normal limits for age. Small layering stones are present at the fundus the gallbladder. There is no inflammation about the gallbladder. The adrenal glands are normal bilaterally. A simple cyst of the left kidney measures 2.5 cm. A simple cyst in the right kidney measures 0.9 cm. Kidneys and ureters are otherwise within normal limits. Urinary bladder is unremarkable.  Inflammatory changes are present about the sigmoid colon with extensive diverticula. There is no evidence for free air or abscess. Additional diverticular present in the more proximal sigmoid and descending colon without other inflammatory change. Contrast can be seen to the level of the splenic flexure. The more proximal colon is normal. The appendix is visualized and normal. The small bowel is unremarkable. Atherosclerotic changes are present in the aorta and branch vessels.  The patient is status post hysterectomy. The ovaries are not visualized and may be surgically absent as well. Retroperitoneal surgical clips are present bilaterally compatible  with nodal dissection. An inguinal hernia is present on the left containing fat.  Rightward curvature of the lumbar spine is centered at L4-5. There is leftward curvature of thoracolumbar junction. Asymmetric degenerative endplate changes are present on the right.  IMPRESSION: 1. Sigmoid diverticulitis without complicating features of abscess or free air. 2. Layering gallstones without cholecystitis. 3. Extensive atherosclerosis without aneurysm. 4. Bilateral renal cysts.   Electronically Signed   By: CSan MorelleM.D.   On: 03/09/2015 16:33    Microbiology: No results found for this or any previous visit (from the past 240 hour(s)).   Labs: Basic Metabolic Panel:  Recent Labs Lab 03/09/15 1343 03/13/15 1030 03/13/15 1853 03/14/15 0325 03/15/15 0328  NA  --  137 138 139 140  K  --  4.0 3.6 3.6 3.8  CL  --  104 109 110 112*  CO2  --  _0 GLUCOSE  --  122* 87 102* 117*  BUN  --  _1 CREATININE 1.07* 1.12* 0.94 0.92 0.94  CALCIUM  --  9.0 8.0* 8.1* 7.8*  MG  --   --  1.9  --   --   PHOS  --   --  2.4*  --   --    Liver Function Tests:  Recent Labs Lab 03/13/15 1030 03/13/15 1853  AST 23 23  ALT 14 12*  ALKPHOS 50 41  BILITOT 0.6 0.6  PROT 6.7 5.5*  ALBUMIN 3.5 3.0*    Recent Labs Lab 03/13/15 1030  LIPASE 31   No results for input(s): AMMONIA in the last 168 hours. CBC:  Recent Labs Lab 03/13/15 1030 03/13/15 1853 03/14/15 0325 03/15/15 0328  WBC 13.2* 9.3 8.2 7.2  NEUTROABS 10.5* 6.8  --   --   HGB 13.1 11.7* 11.6* 10.8*  HCT 38.4 35.6* 34.5* 32.9*  MCV 90.6 90.6 90.8 91.4  PLT 283 276 266 248   Cardiac Enzymes: No results for input(s): CKTOTAL, CKMB, CKMBINDEX, TROPONINI in the last 168 hours. BNP: BNP (last 3 results) No results for input(s): BNP in the last 8760 hours.  ProBNP (last 3 results) No results for input(s): PROBNP in the last 8760 hours.  CBG:  Recent Labs Lab 03/14/15 0756 03/15/15 0736  GLUCAP 83 89        Signed:  Rollo Farquhar A  [facility] 03/15/2015, 11:26 AM

## 2015-03-15 NOTE — Progress Notes (Addendum)
ANTIBIOTIC CONSULT NOTE  Pharmacy Consult: Cipro Indication:  Intra-abdominal infection  No Known Allergies  Patient Measurements: Height: 5\' 4"  (162.6 cm) Weight: 158 lb 8.2 oz (71.9 kg) IBW/kg (Calculated) : 54.7  Vital Signs: Temp: 98.3 F (36.8 C) (06/10 0400) BP: 129/48 mmHg (06/10 0400) Pulse Rate: 51 (06/10 0400)  Labs:  Recent Labs  03/13/15 1853 03/14/15 0325 03/15/15 0328  WBC 9.3 8.2 7.2  HGB 11.7* 11.6* 10.8*  PLT 276 266 248  CREATININE 0.94 0.92 0.94   Estimated Creatinine Clearance: 43.3 mL/min (by C-G formula based on Cr of 0.94). No results for input(s): VANCOTROUGH, VANCOPEAK, VANCORANDOM, GENTTROUGH, GENTPEAK, GENTRANDOM, TOBRATROUGH, TOBRAPEAK, TOBRARND, AMIKACINPEAK, AMIKACINTROU, AMIKACIN in the last 72 hours.   Microbiology: No results found for this or any previous visit (from the past 720 hour(s)).  Assessment: 45 YOF with diverticulitis presented with nausea, diarrhea, and abdominal pain.  Pharmacy consulted to resumed Cipro for intra-abdominal infection.  Renal function has improved from admission and is now stable at 0.9 with eCrCl ~40-14mL/min. WBC nml, afebrile.  Cipro PTA (started ?6/4) >> Flagyl PTA (started ?6/4) >>   Goal of Therapy:  Resolution of infection   Plan:  - Cipro 500mg  PO Q12H - Flagyl 500mg  IV q8h per MD - pharmacy to sign off as no further dose adjustments anticipated.   Berle Fitz D. Merrell Borsuk, PharmD, BCPS Clinical Pharmacist Pager: [phone] 03/15/2015 1:02 PM

## 2015-03-15 NOTE — Progress Notes (Signed)
Discharge paperwork given to patient. IV removed. Prescription given to patient.Questions answered. Patient ready for discharge

## 2015-03-20 DIAGNOSIS — Z6826 Body mass index (BMI) 26.0-26.9, adult: Secondary | ICD-10-CM | POA: Diagnosis not present

## 2015-03-20 DIAGNOSIS — K5732 Diverticulitis of large intestine without perforation or abscess without bleeding: Secondary | ICD-10-CM | POA: Diagnosis not present

## 2015-04-09 DIAGNOSIS — H3531 Nonexudative age-related macular degeneration: Secondary | ICD-10-CM | POA: Diagnosis not present

## 2015-04-09 DIAGNOSIS — H35363 Drusen (degenerative) of macula, bilateral: Secondary | ICD-10-CM | POA: Diagnosis not present

## 2015-05-05 ENCOUNTER — Encounter (HOSPITAL_COMMUNITY): Payer: Self-pay | Admitting: Emergency Medicine

## 2015-05-05 ENCOUNTER — Inpatient Hospital Stay (HOSPITAL_COMMUNITY)
Admission: EM | Admit: 2015-05-05 | Discharge: 2015-05-08 | DRG: 392 | Disposition: A | Discharge status: Home / Self Care | Payer: Medicare Other | Attending: Internal Medicine | Admitting: Internal Medicine

## 2015-05-05 ENCOUNTER — Emergency Department (HOSPITAL_COMMUNITY): Payer: Medicare Other

## 2015-05-05 DIAGNOSIS — H3530 Unspecified macular degeneration: Secondary | ICD-10-CM | POA: Diagnosis present

## 2015-05-05 DIAGNOSIS — Z8249 Family history of ischemic heart disease and other diseases of the circulatory system: Secondary | ICD-10-CM | POA: Diagnosis not present

## 2015-05-05 DIAGNOSIS — K5792 Diverticulitis of intestine, part unspecified, without perforation or abscess without bleeding: Secondary | ICD-10-CM

## 2015-05-05 DIAGNOSIS — E785 Hyperlipidemia, unspecified: Secondary | ICD-10-CM | POA: Diagnosis present

## 2015-05-05 DIAGNOSIS — Z90710 Acquired absence of both cervix and uterus: Secondary | ICD-10-CM

## 2015-05-05 DIAGNOSIS — Z87891 Personal history of nicotine dependence: Secondary | ICD-10-CM

## 2015-05-05 DIAGNOSIS — R1032 Left lower quadrant pain: Secondary | ICD-10-CM | POA: Diagnosis not present

## 2015-05-05 DIAGNOSIS — R7989 Other specified abnormal findings of blood chemistry: Secondary | ICD-10-CM | POA: Diagnosis present

## 2015-05-05 DIAGNOSIS — D72829 Elevated white blood cell count, unspecified: Secondary | ICD-10-CM | POA: Diagnosis not present

## 2015-05-05 DIAGNOSIS — K5732 Diverticulitis of large intestine without perforation or abscess without bleeding: Secondary | ICD-10-CM | POA: Diagnosis not present

## 2015-05-05 DIAGNOSIS — R945 Abnormal results of liver function studies: Secondary | ICD-10-CM | POA: Diagnosis present

## 2015-05-05 DIAGNOSIS — K219 Gastro-esophageal reflux disease without esophagitis: Secondary | ICD-10-CM | POA: Diagnosis present

## 2015-05-05 DIAGNOSIS — Z801 Family history of malignant neoplasm of trachea, bronchus and lung: Secondary | ICD-10-CM | POA: Diagnosis not present

## 2015-05-05 DIAGNOSIS — Z823 Family history of stroke: Secondary | ICD-10-CM | POA: Diagnosis not present

## 2015-05-05 LAB — COMPREHENSIVE METABOLIC PANEL
ALBUMIN: 3.1 g/dL — AB (ref 3.5–5.0)
ALT: 107 U/L — ABNORMAL HIGH (ref 14–54)
ANION GAP: 9 (ref 5–15)
AST: 54 U/L — ABNORMAL HIGH (ref 15–41)
Alkaline Phosphatase: 135 U/L — ABNORMAL HIGH (ref 38–126)
BUN: 14 mg/dL (ref 6–20)
CO2: 26 mmol/L (ref 22–32)
CREATININE: 0.97 mg/dL (ref 0.44–1.00)
Calcium: 9 mg/dL (ref 8.9–10.3)
Chloride: 101 mmol/L (ref 101–111)
GFR calc non Af Amer: 52 mL/min — ABNORMAL LOW (ref >60)
Glucose, Bld: 108 mg/dL — ABNORMAL HIGH (ref 65–99)
Potassium: 4.2 mmol/L (ref 3.5–5.1)
Sodium: 136 mmol/L (ref 135–145)
TOTAL PROTEIN: 6.5 g/dL (ref 6.5–8.1)
Total Bilirubin: 0.8 mg/dL (ref 0.3–1.2)

## 2015-05-05 LAB — CBC
HCT: 36.3 % (ref 36.0–46.0)
Hemoglobin: 12.2 g/dL (ref 12.0–15.0)
MCH: 30.5 pg (ref 26.0–34.0)
MCHC: 33.6 g/dL (ref 30.0–36.0)
MCV: 90.8 fL (ref 78.0–100.0)
PLATELETS: 270 10*3/uL (ref 150–400)
RBC: 4 MIL/uL (ref 3.87–5.11)
RDW: 13.6 % (ref 11.5–15.5)
WBC: 14.7 10*3/uL — ABNORMAL HIGH (ref 4.0–10.5)

## 2015-05-05 LAB — URINALYSIS, ROUTINE W REFLEX MICROSCOPIC
BILIRUBIN URINE: NEGATIVE
GLUCOSE, UA: NEGATIVE mg/dL
HGB URINE DIPSTICK: NEGATIVE
Ketones, ur: 15 mg/dL — AB
Leukocytes, UA: NEGATIVE
Nitrite: NEGATIVE
PROTEIN: NEGATIVE mg/dL
Specific Gravity, Urine: 1.011 (ref 1.005–1.030)
UROBILINOGEN UA: 0.2 mg/dL (ref 0.0–1.0)
pH: 6 (ref 5.0–8.0)

## 2015-05-05 LAB — MAGNESIUM: Magnesium: 2.2 mg/dL (ref 1.7–2.4)

## 2015-05-05 LAB — LIPASE, BLOOD: Lipase: 29 U/L (ref 22–51)

## 2015-05-05 MED ORDER — METRONIDAZOLE IN NACL 5-0.79 MG/ML-% IV SOLN
500.0000 mg | Freq: Once | INTRAVENOUS | Status: DC
  Filled 2015-05-05: qty 100

## 2015-05-05 MED ORDER — CIPROFLOXACIN IN D5W 400 MG/200ML IV SOLN
400.0000 mg | Freq: Once | INTRAVENOUS | Status: DC
  Filled 2015-05-05: qty 200

## 2015-05-05 MED ORDER — PIPERACILLIN-TAZOBACTAM 3.375 G IVPB 30 MIN
3.3750 g | Freq: Once | INTRAVENOUS | Status: AC
Start: 2015-05-05 — End: 2015-05-05
  Administered 2015-05-05: 3.375 g via INTRAVENOUS
  Filled 2015-05-05: qty 50

## 2015-05-05 MED ORDER — ENOXAPARIN SODIUM 40 MG/0.4ML ~~LOC~~ SOLN
40.0000 mg | SUBCUTANEOUS | Status: DC
  Administered 2015-05-06 – 2015-05-07 (×2): 40 mg via SUBCUTANEOUS
  Filled 2015-05-05 (×2): qty 0.4

## 2015-05-05 MED ORDER — PIPERACILLIN-TAZOBACTAM 3.375 G IVPB 30 MIN: 3.3750 g | Freq: Three times a day (TID) | INTRAVENOUS | Status: DC

## 2015-05-05 MED ORDER — ONDANSETRON HCL 4 MG PO TABS: 4.0000 mg | ORAL_TABLET | Freq: Four times a day (QID) | ORAL | Status: DC | PRN

## 2015-05-05 MED ORDER — ONDANSETRON HCL 4 MG/2ML IJ SOLN: 4.0000 mg | Freq: Four times a day (QID) | INTRAMUSCULAR | Status: DC | PRN

## 2015-05-05 MED ORDER — SODIUM CHLORIDE 0.9 % IV SOLN
INTRAVENOUS | Status: DC
  Administered 2015-05-05 – 2015-05-06 (×2): via INTRAVENOUS

## 2015-05-05 MED ORDER — IOHEXOL 300 MG/ML  SOLN
25.0000 mL | Freq: Once | INTRAMUSCULAR | Status: AC | PRN
  Administered 2015-05-05: 25 mL via ORAL

## 2015-05-05 MED ORDER — SODIUM CHLORIDE 0.9 % IV SOLN
Freq: Once | INTRAVENOUS | Status: AC
  Administered 2015-05-05: 19:00:00 via INTRAVENOUS

## 2015-05-05 MED ORDER — IOHEXOL 300 MG/ML  SOLN
100.0000 mL | Freq: Once | INTRAMUSCULAR | Status: AC | PRN
  Administered 2015-05-05: 100 mL via INTRAVENOUS

## 2015-05-05 MED ORDER — PIPERACILLIN-TAZOBACTAM 3.375 G IVPB
3.3750 g | Freq: Three times a day (TID) | INTRAVENOUS | Status: DC
  Administered 2015-05-06 – 2015-05-07 (×5): 3.375 g via INTRAVENOUS
  Filled 2015-05-05 (×6): qty 50

## 2015-05-05 MED ORDER — ONDANSETRON HCL 4 MG/2ML IJ SOLN
4.0000 mg | Freq: Once | INTRAMUSCULAR | Status: AC
  Administered 2015-05-05: 4 mg via INTRAVENOUS
  Filled 2015-05-05: qty 2

## 2015-05-05 MED ORDER — FENTANYL CITRATE (PF) 100 MCG/2ML IJ SOLN
50.0000 ug | Freq: Once | INTRAMUSCULAR | Status: AC
  Administered 2015-05-05: 50 ug via INTRAVENOUS
  Filled 2015-05-05: qty 2

## 2015-05-05 MED ORDER — FENTANYL CITRATE (PF) 100 MCG/2ML IJ SOLN: 25.0000 ug | INTRAMUSCULAR | Status: DC | PRN

## 2015-05-05 NOTE — ED Notes (Signed)
Hospitalist at bedside 

## 2015-05-05 NOTE — H&P (Signed)
[facility] History and Physical  MARICE GUIDONE AST:[id number] DOB: 03/28/1928 DOA: 05/05/2015  Referring physician: Noemi Chapel, M.D. PCP: Precious Reel, MD   Chief Complaint: Abdominal pain  HPI: Charlene [name] is a 81 y.o. female with a past medical history hyperlipidemia and who was admitted in June 8 for an episode of diverticulitis returns to the emergency department due to progressively worse abdominal pain, decreased appetite, nausea, 1 episode of emesis on Thursday and change in bowel habits for several days. However, the patient thinks that she never really fully recovered from her first acute diverticulitis episode. She denies fever, but complains of fatigue and chills. She states that she eats a lot of tomatoes and occasionally eats popcorn. She is not sure if one of these meals specifically trigger the symptoms.  She is currently in no acute distress after she was medicated with an analgesic. Denies any other acute complaints.   Review of Systems:  Constitutional:  No weight loss, night sweats, Fevers, chills, fatigue.  HEENT:  No headaches, Difficulty swallowing,Tooth/dental problems,Sore throat,  No sneezing, itching, ear ache, nasal congestion, post nasal drip,  Cardio-vascular:  No chest pain, Orthopnea, PND, swelling in lower extremities, anasarca, dizziness, palpitations  GI:  No heartburn, indigestion,  Positive abdominal pain, nausea, loss of appetite  vomiting once on Thursday, No diarrhea, but has been having multiple bowel movements, Resp:  No shortness of breath with exertion or at rest. No excess mucus, no productive cough, No non-productive cough, No coughing up of blood.No change in color of mucus.No wheezing.No chest wall deformity  Skin:  no rash or lesions.  GU:  no dysuria, change in color of urine, no urgency or frequency. No flank pain.  Musculoskeletal:  No joint pain or swelling. No decreased range of motion. No back pain.  Psych:  No  change in mood or affect. No depression or anxiety. No memory loss.   Past Medical History  Diagnosis Date  . Macular degeneration   . GERD (gastroesophageal reflux disease)   . Allergy     seasonal  . BPPV (benign paroxysmal positional vertigo) 2012  . Depression   . Urine frequency   . Cancer     hx of uterine    Past Surgical History  Procedure Laterality Date  . Abdominal hysterectomy    . Eye surgery      cataract removal bilateral 2012  . Tonsillectomy     Social History:  reports that she quit smoking about 42 years ago. She has never used smokeless tobacco. She reports that she drinks alcohol. She reports that she does not use illicit drugs.  No Known Allergies  Family History  Problem Relation Age of Onset  . Heart disease Mother   . Stroke Father   . Cancer Brother     lung cancer  . Heart disease Brother     Prior to Admission medications   Medication Sig Start Date End Date Taking? Authorizing Provider  Multiple Vitamins-Minerals (PRESERVISION AREDS PO) Take 1 tablet by mouth 2 (two) times daily.   Yes Historical Provider, MD  omega-3 acid ethyl esters (LOVAZA) 1 G capsule Take 1 g by mouth daily.    Yes Historical Provider, MD  triamcinolone (NASACORT AQ) 55 MCG/ACT AERO nasal inhaler Place 2 sprays into the nose daily as needed (for allergies).   Yes Historical Provider, MD  ciprofloxacin (CIPRO) 500 MG tablet Take 500 mg by mouth 2 (two) times daily.    Historical Provider, MD  metroNIDAZOLE (FLAGYL) 500 MG tablet Take 500 mg by mouth 4 (four) times daily.    Historical Provider, MD  ondansetron (ZOFRAN) 4 MG tablet Take 1 tablet (4 mg total) by mouth every 6 (six) hours as needed for nausea. Patient not taking: Reported on 05/05/2015 03/15/15   Elmarie Shiley, MD   Physical Exam: Filed Vitals:   05/05/15 1930 05/05/15 1939 05/05/15 1945 05/05/15 2024  BP:  128/45 124/46 115/40  Pulse: 66 61 66 60  Temp:    98.6 F (37 C)  TempSrc:    Oral  Resp:   20  20  Height:      Weight:      SpO2: 95% 96% 96% 97%    Wt Readings from Last 3 Encounters:  05/05/15 67.586 kg (149 lb)  03/15/15 71.9 kg (158 lb 8.2 oz)  01/19/14 72.576 kg (160 lb)    General:  Appears calm and comfortable Eyes: PERRL, normal lids, irises & conjunctiva ENT: grossly normal hearing, lips & tongue Neck: no LAD, masses or thyromegaly Cardiovascular: RRR, no m/r/g. No LE edema. Telemetry: SR, no arrhythmias  Respiratory: CTA bilaterally, no w/r/r. Normal respiratory effort. Abdomen: soft, right lower quadrant and mild left lower quadrant tenderness, no guarding or rebound tenderness Skin: no rash or induration seen on limited exam Musculoskeletal: grossly normal tone BUE/BLE Psychiatric: grossly normal mood and affect, speech fluent and appropriate Neurologic: grossly non-focal.          Labs on Admission:  Basic Metabolic Panel:  Recent Labs Lab 05/05/15 1610  NA 136  K 4.2  CL 101  CO2 26  GLUCOSE 108*  BUN 14  CREATININE 0.97  CALCIUM 9.0   Liver Function Tests:  Recent Labs Lab 05/05/15 1610  AST 54*  ALT 107*  ALKPHOS 135*  BILITOT 0.8  PROT 6.5  ALBUMIN 3.1*    Recent Labs Lab 05/05/15 1610  LIPASE 29   No results for input(s): AMMONIA in the last 168 hours. CBC:  Recent Labs Lab 05/05/15 1610  WBC 14.7*  HGB 12.2  HCT 36.3  MCV 90.8  PLT 270   Cardiac Enzymes: No results for input(s): CKTOTAL, CKMB, CKMBINDEX, TROPONINI in the last 168 hours.  BNP (last 3 results) No results for input(s): BNP in the last 8760 hours.  ProBNP (last 3 results) No results for input(s): PROBNP in the last 8760 hours.  CBG: No results for input(s): GLUCAP in the last 168 hours.  Radiological Exams on Admission: Ct Abdomen Pelvis W Contrast  05/05/2015   CLINICAL DATA:  Left lower quadrant and suprapubic pain. Nausea. History of diverticulitis 1 month prior.  EXAM: CT ABDOMEN AND PELVIS WITH CONTRAST  TECHNIQUE: Multidetector CT  imaging of the abdomen and pelvis was performed using the standard protocol following bolus administration of intravenous contrast.  CONTRAST:  137mL OMNIPAQUE IOHEXOL 300 MG/ML  SOLN  COMPARISON:  CT 03/09/2015  FINDINGS: Lower chest: Scarring in the lingula is unchanged. Minimal scarring in the medial right lower lobe. Physiologic pericardial fluid. Moderate hiatal hernia is unchanged.  Liver: Tiny subcentimeter cyst in the subcapsular left lobe, unchanged. No suspicious lesion.  Hepatobiliary: Gallbladder physiologically distended. There is a Phrygian cap containing small stones. No pericholecystic inflammatory change. No biliary dilatation.  Pancreas: Normal.  Spleen: Normal.  Adrenal glands: No nodule.  Kidneys: Symmetric renal enhancement. No hydronephrosis. Simple cysts in both kidneys, unchanged. Probable parapelvic cysts in the left kidney.  Stomach/Bowel: Stomach physiologically distended. There are no dilated or  thickened small bowel loops. Increased fat stranding about an inflamed diverticulum involving the sigmoid colon, progressive since prior exam. No perforation, free air or fluid collection. Additional noninflamed diverticula in the descending and sigmoid colon. Small volume of colonic stool. The appendix is normal.  Vascular/Lymphatic: No retroperitoneal adenopathy. Abdominal aorta is normal in caliber. Dense atherosclerosis without aneurysm.  Reproductive: Uterus is surgically absent. No adnexal mass. Surgical clips in the pelvis likely prior lymph node dissection.  Bladder: Physiologically distended.  No intravesicular air.  Other: No pelvic free fluid. Fat containing umbilical hernia. There is fat in the left inguinal canal.  Musculoskeletal: There are no acute or suspicious osseous abnormalities. Stable degenerative change in the spine.  IMPRESSION: 1. Acute sigmoid diverticulitis. This may be persistent or recurrent, however the degree of inflammatory change has progressed from prior exam. No  perforation or abscess. 2. Stable chronic findings include cholelithiasis, renal cysts, and atherosclerosis.   Electronically Signed   By: Jeb Levering M.D.   On: 05/05/2015 18:29    EKG: Independently reviewed.  Sinus rhythm with first-degree AV block.   Assessment/Plan Principal problem   Acute diverticulitis Active Problems:   Dyslipidemia   Leukocytosis    Abnormal LFTs    Admit to continue IV antibiotic therapy. I will continue Zosyn since the patient stated that she gets very anxious, nauseous and a feeling of sickness with metronidazole.  Analgesics as needed. Gentle IV hydration. Nothing by mouth for tonight. Monitor CBC and CMP daily.    Code Status: Full code. DVT Prophylaxis: Lovenox SQ. Family Communication: Merelyn, [place] [phone]  Disposition Plan: Home with outpatient follow-up.  Time spent: 60 minutes.  Reubin Milan [facility] Pager [phone].

## 2015-05-05 NOTE — ED Provider Notes (Signed)
History   Chief Complaint  Patient presents with  . Abdominal Pain  . Nausea    HPI 81 year old female past history as below notable for remote history of uterine cancer status post hysterectomy, recent admission for diverticulitis last month who initially improved and is presenting today for suprapubic and left lower quadrant pain. Patient also reports having decreased flatus and difficulty having bowel movements. She says she has had numerous bouts of small volume loose stools over the past 24 hours. She reports she vomited on 3 days ago which was nonbilious and nonbloody. She denies any fevers, chills, chest pain, shortness breath, cough, upper abdominal pain. She does report having history of biliary sludge and reports that she has recently touch base with her general surgeon to have her gallbladder out because she has been having gallbladder attacks but says her pain today is much lower in the abdomen. Palpation worsens pain. Also bowel movements worsened pain. Patient says she completed a course of Cipro and Flagyl while she was admitted last month. No other complaints at this time.  Pain is rated as moderate. Pain does not radiate. Onset was gradual.  Past medical/surgical history, social history, medications, allergies and FH have been reviewed with patient and/or in documentation.  Past Medical History  Diagnosis Date  . Macular degeneration   . GERD (gastroesophageal reflux disease)   . Allergy     seasonal  . BPPV (benign paroxysmal positional vertigo) 2012  . Depression   . Urine frequency   . Cancer     hx of uterine    Past Surgical History  Procedure Laterality Date  . Abdominal hysterectomy    . Eye surgery      cataract removal bilateral 2012  . Tonsillectomy     Family History  Problem Relation Age of Onset  . Heart disease Mother   . Stroke Father   . Cancer Brother     lung cancer  . Heart disease Brother    History  Substance Use Topics  . Smoking  status: Former Smoker    Quit date: 01/19/1973  . Smokeless tobacco: Never Used  . Alcohol Use: Yes     Comment: occ     Review of Systems Constitutional: Negative for fever, chills and fatigue.  HENT: Negative for congestion, rhinorrhea and sore throat.   Eyes: Negative for visual disturbance.  Respiratory: Negative for cough, shortness of breath and wheezing.   Cardiovascular: Negative for chest pain.  Gastrointestinal: Pos abd pain, nausea, vomiting, and diarrhea.  Genitourinary: Negative for flank pain, dysuria, frequency.  Musculoskeletal: Negative for back pain, neck pain and neck stiffness, leg pain/swelling.  Skin: Negative for rash.  Neurological: Negative for dizziness and headaches.  All other systems reviewed and are negative.   Physical Exam  Physical Exam ED Triage Vitals  Enc Vitals Group     BP 05/05/15 1603 116/88 mmHg     Pulse Rate 05/05/15 1603 78     Resp 05/05/15 1603 16     Temp 05/05/15 1603 97.9 F (36.6 C)     Temp Source 05/05/15 1603 Oral     SpO2 05/05/15 1603 97 %     Weight 05/05/15 1606 149 lb (67.586 kg)     Height 05/05/15 1606 5\' 4"  (1.626 m)     Head Cir --      Peak Flow --      Pain Score 05/05/15 1606 7     Pain Loc --  Pain Edu? --      Excl. in [place]? --    Constitutional: Patient is well-appearing, well-nourished and in no acute distress. Head: Normocephalic and atraumatic.  Eyes: Extraocular motion intact, no scleral icterus Neck: Supple without meningismus, mass, or overt JVD Respiratory: Effort normal and breath sounds normal. No respiratory distress. CV: Heart regular rate and rhythm, no obvious murmurs.  Pulses +2 and symmetric Abdomen: Soft, non-distended, with mild TTP in suprapubic/LLQ region(s). No rebound/guarding. No TTP over McBurney's, Neg Murphy/Rovsing signs. No mass.  MSK: Extremities are atraumatic without deformity, ROM intact Skin: Warm, dry, intact Neuro: Alert and oriented, no motor deficit  noted Psychiatric: Mood and affect are normal.   ED Course  Procedures  MDM: KAROLYNA BIANCHINI is a 81 y.o. female p/w abd pain. H&P as above.  Clinical picture c/f diverticulitis. Does not suggest acute obstruction, perforation, ischemia, acute pancreatitis, cholecystitis, appendicitis, AAA, UTI, pyelonephritis, nephrolithiasis, hernia  W/u notable for leukocytosis. CT shows acute diverticulitis without complication.  Zosyn given.  Given recurrent diverticulitis which appears worse than prior in pt who has been having difficulty tol PO, will admit for IV ABX.  Clinical Impression: 1. Acute diverticulitis     Disposition: admit  Condition: stable  I have discussed the results, Dx and Tx plan with the pt(& family if present). He/she/they expressed understanding and agree(s) with the plan.   Pt seen in conjunction with Dr. Henderson [place], [place] Resident - PGY-3     Kirstie Peri, MD 05/06/15 [id number]  Noemi Chapel, MD 05/06/15 [phone]

## 2015-05-05 NOTE — ED Notes (Signed)
Pt c/o admitted here 1 month ago for diverticulitis and felt she was getting better. Around 0400 this morning she had severe low-mid abdominal pain with nausea. Pt reports that she has been having frequent bowel movements today but in small amounts.

## 2015-05-05 NOTE — ED Notes (Signed)
MD at bedside. 

## 2015-05-05 NOTE — ED Notes (Signed)
Attempted report 

## 2015-05-05 NOTE — ED Provider Notes (Signed)
The patient is a 81 year old female, she has a history of recently diagnosed diverticulitis for which she was admitted to the hospital. She improved significantly but over the last couple of days she has developed recurrent lower abdominal pain, suprapubic and left lower quadrant in location, this gets worse with bowel movements, she is having 4 or 5 small loose amount of stool per day. No dysuria, subjective fevers, no back pain. On exam she has a soft abdomen which is tender only in the suprapubic and left lower quadrant, there is no right upper quadrant tenderness, no Murphy sign, no masses, clear heart and lung sounds, no peripheral edema. Her labs show that she has a mild transaminitis, she has a leukocytosis of over 14,000, CT scan is ordered to rule out lower abdominal pathology. The history does not suggest that this is cholecystitis despite abnormal labs.  I saw and evaluated the patient, reviewed the resident's note and I agree with the findings and plan.    Final diagnoses:  Acute diverticulitis      Noemi Chapel, MD 05/06/15 [phone]

## 2015-05-06 DIAGNOSIS — R7989 Other specified abnormal findings of blood chemistry: Secondary | ICD-10-CM

## 2015-05-06 DIAGNOSIS — E785 Hyperlipidemia, unspecified: Secondary | ICD-10-CM

## 2015-05-06 DIAGNOSIS — K5792 Diverticulitis of intestine, part unspecified, without perforation or abscess without bleeding: Secondary | ICD-10-CM

## 2015-05-06 LAB — COMPREHENSIVE METABOLIC PANEL
ALT: 73 U/L — ABNORMAL HIGH (ref 14–54)
AST: 32 U/L (ref 15–41)
Albumin: 2.7 g/dL — ABNORMAL LOW (ref 3.5–5.0)
Alkaline Phosphatase: 105 U/L (ref 38–126)
Anion gap: 4 — ABNORMAL LOW (ref 5–15)
BILIRUBIN TOTAL: 0.8 mg/dL (ref 0.3–1.2)
BUN: 11 mg/dL (ref 6–20)
CO2: 25 mmol/L (ref 22–32)
CREATININE: 1.13 mg/dL — AB (ref 0.44–1.00)
Calcium: 8.3 mg/dL — ABNORMAL LOW (ref 8.9–10.3)
Chloride: 106 mmol/L (ref 101–111)
GFR calc Af Amer: 50 mL/min — ABNORMAL LOW (ref >60)
GFR calc non Af Amer: 43 mL/min — ABNORMAL LOW (ref >60)
GLUCOSE: 88 mg/dL (ref 65–99)
Potassium: 4.1 mmol/L (ref 3.5–5.1)
SODIUM: 135 mmol/L (ref 135–145)
Total Protein: 5.5 g/dL — ABNORMAL LOW (ref 6.5–8.1)

## 2015-05-06 LAB — CBC WITH DIFFERENTIAL/PLATELET
BASOS PCT: 0 % (ref 0–1)
Basophils Absolute: 0 10*3/uL (ref 0.0–0.1)
Eosinophils Absolute: 0.2 10*3/uL (ref 0.0–0.7)
Eosinophils Relative: 2 % (ref 0–5)
HCT: 32.6 % — ABNORMAL LOW (ref 36.0–46.0)
Hemoglobin: 11 g/dL — ABNORMAL LOW (ref 12.0–15.0)
LYMPHS PCT: 13 % (ref 12–46)
Lymphs Abs: 1.7 10*3/uL (ref 0.7–4.0)
MCH: 30.9 pg (ref 26.0–34.0)
MCHC: 33.7 g/dL (ref 30.0–36.0)
MCV: 91.6 fL (ref 78.0–100.0)
Monocytes Absolute: 1.3 10*3/uL — ABNORMAL HIGH (ref 0.1–1.0)
Monocytes Relative: 10 % (ref 3–12)
Neutro Abs: 9.9 10*3/uL — ABNORMAL HIGH (ref 1.7–7.7)
Neutrophils Relative %: 75 % (ref 43–77)
PLATELETS: 241 10*3/uL (ref 150–400)
RBC: 3.56 MIL/uL — AB (ref 3.87–5.11)
RDW: 13.6 % (ref 11.5–15.5)
WBC: 13.1 10*3/uL — AB (ref 4.0–10.5)

## 2015-05-06 MED ORDER — HYDROCODONE-ACETAMINOPHEN 5-325 MG PO TABS: 1.0000 | ORAL_TABLET | ORAL | Status: DC | PRN

## 2015-05-06 NOTE — Progress Notes (Signed)
[facility] PROGRESS NOTE  Charlene [name] VQQ:[id number] DOB: [birth year] DOA: 05/05/2015 PCP: Precious Reel, MD  Assessment/Plan: 1. Acute diverticulitis -improving, WBC improving, abd benign -start clears, continue Iv Abx -last colonoscopy in 2006 with diverticulosis with Leb GI -recent diverticulitis in early June -FU with GI as outpatient  2. Leukocytosis -due to 1  DVt proph: lovenox  Code Status: Full Code Family Communication: none at bedside Disposition Plan: home in 1-2days     HPI/Subjective: Feels well, no pain, N/V  Objective: Filed Vitals:   05/05/15 2024  BP: 115/40  Pulse: 60  Temp: 98.6 F (37 C)  Resp: 20   No intake or output data in the 24 hours ending 05/06/15 1448 Filed Weights   05/05/15 1606  Weight: 67.586 kg (149 lb)    Exam:   General:  AAOx3  Cardiovascular: S1S2/RRR  Respiratory: CTAB  Abdomen: soft, NT, BS present  Musculoskeletal: no edema c/c  Data Reviewed: Basic Metabolic Panel:  Recent Labs Lab 05/05/15 1610 05/06/15 0334  NA 136 135  K 4.2 4.1  CL 101 106  CO2 26 25  GLUCOSE 108* 88  BUN 14 11  CREATININE 0.97 1.13*  CALCIUM 9.0 8.3*  MG 2.2  --    Liver Function Tests:  Recent Labs Lab 05/05/15 1610 05/06/15 0334  AST 54* 32  ALT 107* 73*  ALKPHOS 135* 105  BILITOT 0.8 0.8  PROT 6.5 5.5*  ALBUMIN 3.1* 2.7*    Recent Labs Lab 05/05/15 1610  LIPASE 29   No results for input(s): AMMONIA in the last 168 hours. CBC:  Recent Labs Lab 05/05/15 1610 05/06/15 0334  WBC 14.7* 13.1*  NEUTROABS  --  9.9*  HGB 12.2 11.0*  HCT 36.3 32.6*  MCV 90.8 91.6  PLT 270 241   Cardiac Enzymes: No results for input(s): CKTOTAL, CKMB, CKMBINDEX, TROPONINI in the last 168 hours. BNP (last 3 results) No results for input(s): BNP in the last 8760 hours.  ProBNP (last 3 results) No results for input(s): PROBNP in the last 8760 hours.  CBG: No results for input(s): GLUCAP in the last 168  hours.  No results found for this or any previous visit (from the past 240 hour(s)).   Studies: Ct Abdomen Pelvis W Contrast  05/05/2015   CLINICAL DATA:  Left lower quadrant and suprapubic pain. Nausea. History of diverticulitis 1 month prior.  EXAM: CT ABDOMEN AND PELVIS WITH CONTRAST  TECHNIQUE: Multidetector CT imaging of the abdomen and pelvis was performed using the standard protocol following bolus administration of intravenous contrast.  CONTRAST:  191mL OMNIPAQUE IOHEXOL 300 MG/ML  SOLN  COMPARISON:  CT 03/09/2015  FINDINGS: Lower chest: Scarring in the lingula is unchanged. Minimal scarring in the medial right lower lobe. Physiologic pericardial fluid. Moderate hiatal hernia is unchanged.  Liver: Tiny subcentimeter cyst in the subcapsular left lobe, unchanged. No suspicious lesion.  Hepatobiliary: Gallbladder physiologically distended. There is a Phrygian cap containing small stones. No pericholecystic inflammatory change. No biliary dilatation.  Pancreas: Normal.  Spleen: Normal.  Adrenal glands: No nodule.  Kidneys: Symmetric renal enhancement. No hydronephrosis. Simple cysts in both kidneys, unchanged. Probable parapelvic cysts in the left kidney.  Stomach/Bowel: Stomach physiologically distended. There are no dilated or thickened small bowel loops. Increased fat stranding about an inflamed diverticulum involving the sigmoid colon, progressive since prior exam. No perforation, free air or fluid collection. Additional noninflamed diverticula in the descending and sigmoid colon. Small volume of colonic stool. The appendix is normal.  Vascular/Lymphatic: No retroperitoneal adenopathy. Abdominal aorta is normal in caliber. Dense atherosclerosis without aneurysm.  Reproductive: Uterus is surgically absent. No adnexal mass. Surgical clips in the pelvis likely prior lymph node dissection.  Bladder: Physiologically distended.  No intravesicular air.  Other: No pelvic free fluid. Fat containing umbilical  hernia. There is fat in the left inguinal canal.  Musculoskeletal: There are no acute or suspicious osseous abnormalities. Stable degenerative change in the spine.  IMPRESSION: 1. Acute sigmoid diverticulitis. This may be persistent or recurrent, however the degree of inflammatory change has progressed from prior exam. No perforation or abscess. 2. Stable chronic findings include cholelithiasis, renal cysts, and atherosclerosis.   Electronically Signed   By: Jeb Levering M.D.   On: 05/05/2015 18:29    Scheduled Meds: . enoxaparin (LOVENOX) injection  40 mg Subcutaneous Q24H  . piperacillin-tazobactam (ZOSYN)  IV  3.375 g Intravenous Q8H   Continuous Infusions: . sodium chloride 50 mL/hr at 05/05/15 2100   Antibiotics Given (last 72 hours)    Date/Time Action Medication Dose Rate   05/06/15 0146 Given   piperacillin-tazobactam (ZOSYN) IVPB 3.375 g 3.375 g 12.5 mL/hr   05/06/15 0800 Given   piperacillin-tazobactam (ZOSYN) IVPB 3.375 g 3.375 g 12.5 mL/hr      Principal Problem:   Acute diverticulitis Active Problems:   Dyslipidemia   Leukocytosis   Abnormal LFTs    Time spent: 35min    Charlene [name]  [facility] Pager [phone]. If 7PM-7AM, please contact night-coverage at [email or web address], password [facility] 05/06/2015, 2:48 PM  LOS: 1 day

## 2015-05-07 DIAGNOSIS — D72829 Elevated white blood cell count, unspecified: Secondary | ICD-10-CM

## 2015-05-07 LAB — CBC WITH DIFFERENTIAL/PLATELET
BASOS ABS: 0 10*3/uL (ref 0.0–0.1)
Basophils Relative: 1 % (ref 0–1)
EOS ABS: 0.4 10*3/uL (ref 0.0–0.7)
Eosinophils Relative: 5 % (ref 0–5)
HEMATOCRIT: 32.2 % — AB (ref 36.0–46.0)
HEMOGLOBIN: 10.4 g/dL — AB (ref 12.0–15.0)
Lymphocytes Relative: 19 % (ref 12–46)
Lymphs Abs: 1.5 10*3/uL (ref 0.7–4.0)
MCH: 29.5 pg (ref 26.0–34.0)
MCHC: 32.3 g/dL (ref 30.0–36.0)
MCV: 91.2 fL (ref 78.0–100.0)
MONO ABS: 0.9 10*3/uL (ref 0.1–1.0)
Monocytes Relative: 12 % (ref 3–12)
Neutro Abs: 5.1 10*3/uL (ref 1.7–7.7)
Neutrophils Relative %: 63 % (ref 43–77)
Platelets: 232 10*3/uL (ref 150–400)
RBC: 3.53 MIL/uL — AB (ref 3.87–5.11)
RDW: 13.6 % (ref 11.5–15.5)
WBC: 8 10*3/uL (ref 4.0–10.5)

## 2015-05-07 LAB — COMPREHENSIVE METABOLIC PANEL
ALBUMIN: 2.5 g/dL — AB (ref 3.5–5.0)
ALK PHOS: 80 U/L (ref 38–126)
ALT: 53 U/L (ref 14–54)
ANION GAP: 8 (ref 5–15)
AST: 21 U/L (ref 15–41)
BILIRUBIN TOTAL: 0.5 mg/dL (ref 0.3–1.2)
BUN: 9 mg/dL (ref 6–20)
CO2: 26 mmol/L (ref 22–32)
Calcium: 8 mg/dL — ABNORMAL LOW (ref 8.9–10.3)
Chloride: 105 mmol/L (ref 101–111)
Creatinine, Ser: 1.24 mg/dL — ABNORMAL HIGH (ref 0.44–1.00)
GFR calc Af Amer: 45 mL/min — ABNORMAL LOW (ref >60)
GFR, EST NON AFRICAN AMERICAN: 39 mL/min — AB (ref >60)
Glucose, Bld: 92 mg/dL (ref 65–99)
Potassium: 3.9 mmol/L (ref 3.5–5.1)
Sodium: 139 mmol/L (ref 135–145)
TOTAL PROTEIN: 5.5 g/dL — AB (ref 6.5–8.1)

## 2015-05-07 MED ORDER — AMOXICILLIN-POT CLAVULANATE 875-125 MG PO TABS
1.0000 | ORAL_TABLET | Freq: Two times a day (BID) | ORAL | Status: DC
  Administered 2015-05-07 – 2015-05-08 (×3): 1 via ORAL
  Filled 2015-05-07 (×3): qty 1

## 2015-05-07 NOTE — Care Management Note (Signed)
Case Management Note  Patient Details  Name: Charlene [name] MRN: [id number] Date of Birth: [birth year]  Subjective/Objective:                    Action/Plan: UR completed   Expected Discharge Date:                  Expected Discharge Plan:  Home/Self Care  In-House Referral:     Discharge planning Services     Post Acute Care Choice:    Choice offered to:     DME Arranged:    DME Agency:     HH Arranged:    [place] Agency:     Status of Service:  In process, will continue to follow  Medicare Important Message Given:    Date Medicare IM Given:    Medicare IM give by:    Date Additional Medicare IM Given:    Additional Medicare Important Message give by:     If discussed at [place] of Stay Meetings, dates discussed:    Additional Comments:  Marilu Favre, RN 05/07/2015, 11:22 AM

## 2015-05-07 NOTE — Care Management Important Message (Signed)
Important Message  Patient Details  Name: Charlene [name] MRN: [id number] Date of Birth: 08-22-1928   Medicare Important Message Given:  Yes-second notification given    Delorse Lek 05/07/2015, 2:26 PM

## 2015-05-07 NOTE — Progress Notes (Signed)
[facility] PROGRESS NOTE  Charlene [name] [id number]:[id number] DOB: November 07, 1927 DOA: 05/05/2015 PCP: Precious Reel, MD  Assessment/Plan: 1. Acute diverticulitis -improving, WBC normal -advance diet to soft, change Abx to PO -last colonoscopy in 2006 with diverticulosis with Leb GI -recent diverticulitis in early June -FU with GI as outpatient  2. Leukocytosis -due to 1, resolved  DVt proph: lovenox  Code Status: Full Code Family Communication: none at bedside Disposition Plan: home tomorrow    HPI/Subjective: Feels well, no pain, N/V, passing gas  Objective: Filed Vitals:   05/07/15 0521  BP: 112/43  Pulse: 51  Temp: 97.9 F (36.6 C)  Resp: 17    Intake/Output Summary (Last 24 hours) at 05/07/15 1106 Last data filed at 05/07/15 0820  Gross per 24 hour  Intake    270 ml  Output      0 ml  Net    270 ml   Filed Weights   05/05/15 1606  Weight: 67.586 kg (149 lb)    Exam:   General:  AAOx3, no distress  Cardiovascular: S1S2/RRR  Respiratory: CTAB  Abdomen: soft, NT, BS present  Musculoskeletal: no edema c/c  Data Reviewed: Basic Metabolic Panel:  Recent Labs Lab 05/05/15 1610 05/06/15 0334 05/07/15 0334  NA 136 135 139  K 4.2 4.1 3.9  CL 101 106 105  CO2 26 25 26   GLUCOSE 108* 88 92  BUN 14 11 9   CREATININE 0.97 1.13* 1.24*  CALCIUM 9.0 8.3* 8.0*  MG 2.2  --   --    Liver Function Tests:  Recent Labs Lab 05/05/15 1610 05/06/15 0334 05/07/15 0334  AST 54* 32 21  ALT 107* 73* 53  ALKPHOS 135* 105 80  BILITOT 0.8 0.8 0.5  PROT 6.5 5.5* 5.5*  ALBUMIN 3.1* 2.7* 2.5*    Recent Labs Lab 05/05/15 1610  LIPASE 29   No results for input(s): AMMONIA in the last 168 hours. CBC:  Recent Labs Lab 05/05/15 1610 05/06/15 0334 05/07/15 0334  WBC 14.7* 13.1* 8.0  NEUTROABS  --  9.9* 5.1  HGB 12.2 11.0* 10.4*  HCT 36.3 32.6* 32.2*  MCV 90.8 91.6 91.2  PLT 270 241 232   Cardiac Enzymes: No results for input(s): CKTOTAL, CKMB,  CKMBINDEX, TROPONINI in the last 168 hours. BNP (last 3 results) No results for input(s): BNP in the last 8760 hours.  ProBNP (last 3 results) No results for input(s): PROBNP in the last 8760 hours.  CBG: No results for input(s): GLUCAP in the last 168 hours.  No results found for this or any previous visit (from the past 240 hour(s)).   Studies: Ct Abdomen Pelvis W Contrast  05/05/2015   CLINICAL DATA:  Left lower quadrant and suprapubic pain. Nausea. History of diverticulitis 1 month prior.  EXAM: CT ABDOMEN AND PELVIS WITH CONTRAST  TECHNIQUE: Multidetector CT imaging of the abdomen and pelvis was performed using the standard protocol following bolus administration of intravenous contrast.  CONTRAST:  119mL OMNIPAQUE IOHEXOL 300 MG/ML  SOLN  COMPARISON:  CT 03/09/2015  FINDINGS: Lower chest: Scarring in the lingula is unchanged. Minimal scarring in the medial right lower lobe. Physiologic pericardial fluid. Moderate hiatal hernia is unchanged.  Liver: Tiny subcentimeter cyst in the subcapsular left lobe, unchanged. No suspicious lesion.  Hepatobiliary: Gallbladder physiologically distended. There is a Phrygian cap containing small stones. No pericholecystic inflammatory change. No biliary dilatation.  Pancreas: Normal.  Spleen: Normal.  Adrenal glands: No nodule.  Kidneys: Symmetric renal enhancement. No hydronephrosis. Simple  cysts in both kidneys, unchanged. Probable parapelvic cysts in the left kidney.  Stomach/Bowel: Stomach physiologically distended. There are no dilated or thickened small bowel loops. Increased fat stranding about an inflamed diverticulum involving the sigmoid colon, progressive since prior exam. No perforation, free air or fluid collection. Additional noninflamed diverticula in the descending and sigmoid colon. Small volume of colonic stool. The appendix is normal.  Vascular/Lymphatic: No retroperitoneal adenopathy. Abdominal aorta is normal in caliber. Dense atherosclerosis  without aneurysm.  Reproductive: Uterus is surgically absent. No adnexal mass. Surgical clips in the pelvis likely prior lymph node dissection.  Bladder: Physiologically distended.  No intravesicular air.  Other: No pelvic free fluid. Fat containing umbilical hernia. There is fat in the left inguinal canal.  Musculoskeletal: There are no acute or suspicious osseous abnormalities. Stable degenerative change in the spine.  IMPRESSION: 1. Acute sigmoid diverticulitis. This may be persistent or recurrent, however the degree of inflammatory change has progressed from prior exam. No perforation or abscess. 2. Stable chronic findings include cholelithiasis, renal cysts, and atherosclerosis.   Electronically Signed   By: Jeb Levering M.D.   On: 05/05/2015 18:29    Scheduled Meds: . amoxicillin-clavulanate  1 tablet Oral Q12H  . enoxaparin (LOVENOX) injection  40 mg Subcutaneous Q24H   Continuous Infusions:   Antibiotics Given (last 72 hours)    Date/Time Action Medication Dose Rate   05/06/15 0146 Given   piperacillin-tazobactam (ZOSYN) IVPB 3.375 g 3.375 g 12.5 mL/hr   05/06/15 0800 Given   piperacillin-tazobactam (ZOSYN) IVPB 3.375 g 3.375 g 12.5 mL/hr   05/06/15 1604 Given   piperacillin-tazobactam (ZOSYN) IVPB 3.375 g 3.375 g 12.5 mL/hr   05/07/15 0050 Given   piperacillin-tazobactam (ZOSYN) IVPB 3.375 g 3.375 g 12.5 mL/hr   05/07/15 0820 Given   piperacillin-tazobactam (ZOSYN) IVPB 3.375 g 3.375 g 12.5 mL/hr      Principal Problem:   Acute diverticulitis Active Problems:   Dyslipidemia   Leukocytosis   Abnormal LFTs    Time spent: 14min    [facility] [facility] Pager [phone]. If 7PM-7AM, please contact night-coverage at [email or web address], password [facility] 05/07/2015, 11:06 AM  LOS: 2 days

## 2015-05-08 LAB — COMPREHENSIVE METABOLIC PANEL
ALT: 41 U/L (ref 14–54)
ANION GAP: 8 (ref 5–15)
AST: 19 U/L (ref 15–41)
Albumin: 2.7 g/dL — ABNORMAL LOW (ref 3.5–5.0)
Alkaline Phosphatase: 80 U/L (ref 38–126)
BUN: 8 mg/dL (ref 6–20)
CALCIUM: 8.3 mg/dL — AB (ref 8.9–10.3)
CO2: 23 mmol/L (ref 22–32)
CREATININE: 1.01 mg/dL — AB (ref 0.44–1.00)
Chloride: 110 mmol/L (ref 101–111)
GFR calc Af Amer: 58 mL/min — ABNORMAL LOW (ref >60)
GFR calc non Af Amer: 50 mL/min — ABNORMAL LOW (ref >60)
GLUCOSE: 83 mg/dL (ref 65–99)
Potassium: 4.2 mmol/L (ref 3.5–5.1)
Sodium: 141 mmol/L (ref 135–145)
TOTAL PROTEIN: 5.5 g/dL — AB (ref 6.5–8.1)
Total Bilirubin: 0.5 mg/dL (ref 0.3–1.2)

## 2015-05-08 LAB — CBC WITH DIFFERENTIAL/PLATELET
BASOS PCT: 1 % (ref 0–1)
Basophils Absolute: 0.1 10*3/uL (ref 0.0–0.1)
Eosinophils Absolute: 0.5 10*3/uL (ref 0.0–0.7)
Eosinophils Relative: 7 % — ABNORMAL HIGH (ref 0–5)
HCT: 34.3 % — ABNORMAL LOW (ref 36.0–46.0)
HEMOGLOBIN: 11.3 g/dL — AB (ref 12.0–15.0)
Lymphocytes Relative: 22 % (ref 12–46)
Lymphs Abs: 1.7 10*3/uL (ref 0.7–4.0)
MCH: 30.3 pg (ref 26.0–34.0)
MCHC: 32.9 g/dL (ref 30.0–36.0)
MCV: 92 fL (ref 78.0–100.0)
Monocytes Absolute: 0.9 10*3/uL (ref 0.1–1.0)
Monocytes Relative: 12 % (ref 3–12)
NEUTROS ABS: 4.3 10*3/uL (ref 1.7–7.7)
Neutrophils Relative %: 58 % (ref 43–77)
Platelets: 257 10*3/uL (ref 150–400)
RBC: 3.73 MIL/uL — ABNORMAL LOW (ref 3.87–5.11)
RDW: 13.5 % (ref 11.5–15.5)
WBC: 7.5 10*3/uL (ref 4.0–10.5)

## 2015-05-08 MED ORDER — AMOXICILLIN-POT CLAVULANATE 875-125 MG PO TABS: 1.0000 | ORAL_TABLET | Freq: Two times a day (BID) | ORAL | Status: DC

## 2015-05-08 NOTE — Progress Notes (Signed)
Pt being discharged home via wheelchair with family. Pt alert and oriented x4. VSS. Pt c/o no pain at this time. No signs of respiratory distress. Education complete and care plans resolved. IV removed with catheter intact and pt tolerated well. No further issues at this time. Pt to follow up with PCP. Nicolle Heward R, RN 

## 2015-05-08 NOTE — Discharge Summary (Signed)
Physician Discharge Summary  Charlene [name] BOF:[id number] DOB: [birth year] DOA: 05/05/2015  PCP: Charlene Reel, MD  Admit date: 05/05/2015 Discharge date: 05/08/2015  Time spent: 45 minutes  Recommendations for Outpatient Follow-up:  1. [facility] in 1 month for recurrent diverticulitis  Discharge Diagnoses:  Principal Problem:   Acute diverticulitis Active Problems:   Dyslipidemia   Leukocytosis   Abnormal LFTs   Discharge Condition: stable  Diet recommendation: bland diet advance as tolerated  Filed Weights   05/05/15 1606  Weight: 67.586 kg (149 lb)    History of present illness:  Chief Complaint: Abdominal pain HPI: Charlene [name] is a 81 y.o. female with a past medical history hyperlipidemia and who was admitted in June 8 for an episode of diverticulitis returned to the emergency department due to progressively worse abdominal pain, decreased appetite, nausea, 1 episode of emesis on Thursday and change in bowel habits for several days. However, the patient thought that she never really fully recovered from her first acute diverticulitis episode. She denied fever, but complained of fatigue and chills.   Hospital Course:  1. Acute diverticulitis -improving, treated with bowel rest, hydration, Iv Zosyn,  -clinically improved, WBC normalized -diet gradually advanced, changed Abx to PO Augmentin, she is unable to tolerate Metronidazole and hence Augmentin selected -last colonoscopy in 2006 with diverticulosis with [facility] -recent diverticulitis in early June, also treated conservatively -due to recurrent flares of diverticulitis, i have advised her to FU with GI as outpatient to consider repeat colonoscopy down the road  2. Leukocytosis -due to 1, resolved   Discharge Exam: Filed Vitals:   05/08/15 0614  BP: 152/48  Pulse: 53  Temp: 98.4 F (36.9 C)  Resp: 17    General:AAOx3 Cardiovascular:S1S2/RRR Respiratory: CTAB  Discharge Instructions   Discharge  Instructions    Activity as tolerated - No restrictions    Complete by:  As directed      Discharge instructions    Complete by:  As directed   Soft bland diet, advance as tolerated          Discharge Medication List as of 05/08/2015  9:10 AM    START taking these medications   Details  amoxicillin-clavulanate (AUGMENTIN) 875-125 MG per tablet Take 1 tablet by mouth every 12 (twelve) hours. For 8 days, Starting 05/08/2015, Until Discontinued, Print      CONTINUE these medications which have NOT CHANGED   Details  Multiple Vitamins-Minerals (PRESERVISION AREDS PO) Take 1 tablet by mouth 2 (two) times daily., Until Discontinued, Historical Med    omega-3 acid ethyl esters (LOVAZA) 1 G capsule Take 1 g by mouth daily. , Until Discontinued, Historical Med    triamcinolone (NASACORT AQ) 55 MCG/ACT AERO nasal inhaler Place 2 sprays into the nose daily as needed (for allergies)., Until Discontinued, Historical Med    ondansetron (ZOFRAN) 4 MG tablet Take 1 tablet (4 mg total) by mouth every 6 (six) hours as needed for nausea., Starting 03/15/2015, Until Discontinued, Print      STOP taking these medications     ciprofloxacin (CIPRO) 500 MG tablet      metroNIDAZOLE (FLAGYL) 500 MG tablet        No Known Allergies Follow-up Information    Follow up with Charlene Reel, MD. Schedule an appointment as soon as possible for a visit in 1 week.   Specialty:  Internal Medicine   Contact information:   [address] [place] [address] [phone]  Follow up with [facility]. Schedule an appointment as soon as possible for a visit in 3 weeks.       The results of significant diagnostics from this hospitalization (including imaging, microbiology, ancillary and laboratory) are listed below for reference.    Significant Diagnostic Studies: Ct Abdomen Pelvis W Contrast  05/05/2015   CLINICAL DATA:  Left lower quadrant and suprapubic pain. Nausea. History of diverticulitis 1 month  prior.  EXAM: CT ABDOMEN AND PELVIS WITH CONTRAST  TECHNIQUE: Multidetector CT imaging of the abdomen and pelvis was performed using the standard protocol following bolus administration of intravenous contrast.  CONTRAST:  132mL OMNIPAQUE IOHEXOL 300 MG/ML  SOLN  COMPARISON:  CT 03/09/2015  FINDINGS: Lower chest: Scarring in the lingula is unchanged. Minimal scarring in the medial right lower lobe. Physiologic pericardial fluid. Moderate hiatal hernia is unchanged.  Liver: Tiny subcentimeter cyst in the subcapsular left lobe, unchanged. No suspicious lesion.  Hepatobiliary: Gallbladder physiologically distended. There is a Phrygian cap containing small stones. No pericholecystic inflammatory change. No biliary dilatation.  Pancreas: Normal.  Spleen: Normal.  Adrenal glands: No nodule.  Kidneys: Symmetric renal enhancement. No hydronephrosis. Simple cysts in both kidneys, unchanged. Probable parapelvic cysts in the left kidney.  Stomach/Bowel: Stomach physiologically distended. There are no dilated or thickened small bowel loops. Increased fat stranding about an inflamed diverticulum involving the sigmoid colon, progressive since prior exam. No perforation, free air or fluid collection. Additional noninflamed diverticula in the descending and sigmoid colon. Small volume of colonic stool. The appendix is normal.  Vascular/Lymphatic: No retroperitoneal adenopathy. Abdominal aorta is normal in caliber. Dense atherosclerosis without aneurysm.  Reproductive: Uterus is surgically absent. No adnexal mass. Surgical clips in the pelvis likely prior lymph node dissection.  Bladder: Physiologically distended.  No intravesicular air.  Other: No pelvic free fluid. Fat containing umbilical hernia. There is fat in the left inguinal canal.  Musculoskeletal: There are no acute or suspicious osseous abnormalities. Stable degenerative change in the spine.  IMPRESSION: 1. Acute sigmoid diverticulitis. This may be persistent or  recurrent, however the degree of inflammatory change has progressed from prior exam. No perforation or abscess. 2. Stable chronic findings include cholelithiasis, renal cysts, and atherosclerosis.   Electronically Signed   By: Jeb Levering M.D.   On: 05/05/2015 18:29    Microbiology: No results found for this or any previous visit (from the past 240 hour(s)).   Labs: Basic Metabolic Panel:  Recent Labs Lab 05/05/15 1610 05/06/15 0334 05/07/15 0334 05/08/15 0441  NA 136 135 139 141  K 4.2 4.1 3.9 4.2  CL 101 106 105 110  CO2 26 25 26 23   GLUCOSE 108* 88 92 83  BUN 14 11 9 8   CREATININE 0.97 1.13* 1.24* 1.01*  CALCIUM 9.0 8.3* 8.0* 8.3*  MG 2.2  --   --   --    Liver Function Tests:  Recent Labs Lab 05/05/15 1610 05/06/15 0334 05/07/15 0334 05/08/15 0441  AST 54* 32 21 19  ALT 107* 73* 53 41  ALKPHOS 135* 105 80 80  BILITOT 0.8 0.8 0.5 0.5  PROT 6.5 5.5* 5.5* 5.5*  ALBUMIN 3.1* 2.7* 2.5* 2.7*    Recent Labs Lab 05/05/15 1610  LIPASE 29   No results for input(s): AMMONIA in the last 168 hours. CBC:  Recent Labs Lab 05/05/15 1610 05/06/15 0334 05/07/15 0334 05/08/15 0441  WBC 14.7* 13.1* 8.0 7.5  NEUTROABS  --  9.9* 5.1 4.3  HGB 12.2 11.0* 10.4* 11.3*  HCT 36.3 32.6* 32.2* 34.3*  MCV 90.8 91.6 91.2 92.0  PLT 270 241 232 257   Cardiac Enzymes: No results for input(s): CKTOTAL, CKMB, CKMBINDEX, TROPONINI in the last 168 hours. BNP: BNP (last 3 results) No results for input(s): BNP in the last 8760 hours.  ProBNP (last 3 results) No results for input(s): PROBNP in the last 8760 hours.  CBG: No results for input(s): GLUCAP in the last 168 hours.     SignedDomenic Polite  [facility] 05/08/2015, 2:35 PM

## 2015-05-10 ENCOUNTER — Encounter: Payer: Self-pay | Admitting: Internal Medicine

## 2015-06-03 DIAGNOSIS — M5137 Other intervertebral disc degeneration, lumbosacral region: Secondary | ICD-10-CM | POA: Diagnosis not present

## 2015-06-03 DIAGNOSIS — M47816 Spondylosis without myelopathy or radiculopathy, lumbar region: Secondary | ICD-10-CM | POA: Diagnosis not present

## 2015-06-03 DIAGNOSIS — M545 Low back pain: Secondary | ICD-10-CM | POA: Diagnosis not present

## 2015-06-03 DIAGNOSIS — M9903 Segmental and somatic dysfunction of lumbar region: Secondary | ICD-10-CM | POA: Diagnosis not present

## 2015-06-05 DIAGNOSIS — M9903 Segmental and somatic dysfunction of lumbar region: Secondary | ICD-10-CM | POA: Diagnosis not present

## 2015-06-05 DIAGNOSIS — M545 Low back pain: Secondary | ICD-10-CM | POA: Diagnosis not present

## 2015-06-05 DIAGNOSIS — M5137 Other intervertebral disc degeneration, lumbosacral region: Secondary | ICD-10-CM | POA: Diagnosis not present

## 2015-06-05 DIAGNOSIS — M47816 Spondylosis without myelopathy or radiculopathy, lumbar region: Secondary | ICD-10-CM | POA: Diagnosis not present

## 2015-06-06 DIAGNOSIS — M1990 Unspecified osteoarthritis, unspecified site: Secondary | ICD-10-CM | POA: Diagnosis not present

## 2015-06-06 DIAGNOSIS — N281 Cyst of kidney, acquired: Secondary | ICD-10-CM | POA: Diagnosis not present

## 2015-06-06 DIAGNOSIS — J309 Allergic rhinitis, unspecified: Secondary | ICD-10-CM | POA: Diagnosis not present

## 2015-06-06 DIAGNOSIS — H3530 Unspecified macular degeneration: Secondary | ICD-10-CM | POA: Diagnosis not present

## 2015-06-06 DIAGNOSIS — K5730 Diverticulosis of large intestine without perforation or abscess without bleeding: Secondary | ICD-10-CM | POA: Diagnosis not present

## 2015-06-06 DIAGNOSIS — K829 Disease of gallbladder, unspecified: Secondary | ICD-10-CM | POA: Diagnosis not present

## 2015-06-06 DIAGNOSIS — K30 Functional dyspepsia: Secondary | ICD-10-CM | POA: Diagnosis not present

## 2015-06-06 DIAGNOSIS — E781 Pure hyperglyceridemia: Secondary | ICD-10-CM | POA: Diagnosis not present

## 2015-06-11 DIAGNOSIS — M9903 Segmental and somatic dysfunction of lumbar region: Secondary | ICD-10-CM | POA: Diagnosis not present

## 2015-06-11 DIAGNOSIS — M545 Low back pain: Secondary | ICD-10-CM | POA: Diagnosis not present

## 2015-06-11 DIAGNOSIS — M5137 Other intervertebral disc degeneration, lumbosacral region: Secondary | ICD-10-CM | POA: Diagnosis not present

## 2015-06-11 DIAGNOSIS — M47816 Spondylosis without myelopathy or radiculopathy, lumbar region: Secondary | ICD-10-CM | POA: Diagnosis not present

## 2015-06-13 DIAGNOSIS — M5137 Other intervertebral disc degeneration, lumbosacral region: Secondary | ICD-10-CM | POA: Diagnosis not present

## 2015-06-13 DIAGNOSIS — M545 Low back pain: Secondary | ICD-10-CM | POA: Diagnosis not present

## 2015-06-13 DIAGNOSIS — M47816 Spondylosis without myelopathy or radiculopathy, lumbar region: Secondary | ICD-10-CM | POA: Diagnosis not present

## 2015-06-13 DIAGNOSIS — M9903 Segmental and somatic dysfunction of lumbar region: Secondary | ICD-10-CM | POA: Diagnosis not present

## 2015-06-17 DIAGNOSIS — M9903 Segmental and somatic dysfunction of lumbar region: Secondary | ICD-10-CM | POA: Diagnosis not present

## 2015-06-17 DIAGNOSIS — M5137 Other intervertebral disc degeneration, lumbosacral region: Secondary | ICD-10-CM | POA: Diagnosis not present

## 2015-06-17 DIAGNOSIS — M47816 Spondylosis without myelopathy or radiculopathy, lumbar region: Secondary | ICD-10-CM | POA: Diagnosis not present

## 2015-06-17 DIAGNOSIS — M545 Low back pain: Secondary | ICD-10-CM | POA: Diagnosis not present

## 2015-06-19 DIAGNOSIS — M5137 Other intervertebral disc degeneration, lumbosacral region: Secondary | ICD-10-CM | POA: Diagnosis not present

## 2015-06-19 DIAGNOSIS — M9903 Segmental and somatic dysfunction of lumbar region: Secondary | ICD-10-CM | POA: Diagnosis not present

## 2015-06-19 DIAGNOSIS — M545 Low back pain: Secondary | ICD-10-CM | POA: Diagnosis not present

## 2015-06-19 DIAGNOSIS — M47816 Spondylosis without myelopathy or radiculopathy, lumbar region: Secondary | ICD-10-CM | POA: Diagnosis not present

## 2015-06-24 DIAGNOSIS — M9903 Segmental and somatic dysfunction of lumbar region: Secondary | ICD-10-CM | POA: Diagnosis not present

## 2015-06-24 DIAGNOSIS — M5137 Other intervertebral disc degeneration, lumbosacral region: Secondary | ICD-10-CM | POA: Diagnosis not present

## 2015-06-24 DIAGNOSIS — M545 Low back pain: Secondary | ICD-10-CM | POA: Diagnosis not present

## 2015-06-24 DIAGNOSIS — M47816 Spondylosis without myelopathy or radiculopathy, lumbar region: Secondary | ICD-10-CM | POA: Diagnosis not present

## 2015-06-27 DIAGNOSIS — M545 Low back pain: Secondary | ICD-10-CM | POA: Diagnosis not present

## 2015-06-27 DIAGNOSIS — M9903 Segmental and somatic dysfunction of lumbar region: Secondary | ICD-10-CM | POA: Diagnosis not present

## 2015-06-27 DIAGNOSIS — M47816 Spondylosis without myelopathy or radiculopathy, lumbar region: Secondary | ICD-10-CM | POA: Diagnosis not present

## 2015-06-27 DIAGNOSIS — M5137 Other intervertebral disc degeneration, lumbosacral region: Secondary | ICD-10-CM | POA: Diagnosis not present

## 2015-07-01 DIAGNOSIS — M545 Low back pain: Secondary | ICD-10-CM | POA: Diagnosis not present

## 2015-07-01 DIAGNOSIS — M5137 Other intervertebral disc degeneration, lumbosacral region: Secondary | ICD-10-CM | POA: Diagnosis not present

## 2015-07-01 DIAGNOSIS — M47816 Spondylosis without myelopathy or radiculopathy, lumbar region: Secondary | ICD-10-CM | POA: Diagnosis not present

## 2015-07-01 DIAGNOSIS — M9903 Segmental and somatic dysfunction of lumbar region: Secondary | ICD-10-CM | POA: Diagnosis not present

## 2015-07-03 DIAGNOSIS — M5137 Other intervertebral disc degeneration, lumbosacral region: Secondary | ICD-10-CM | POA: Diagnosis not present

## 2015-07-03 DIAGNOSIS — M47816 Spondylosis without myelopathy or radiculopathy, lumbar region: Secondary | ICD-10-CM | POA: Diagnosis not present

## 2015-07-03 DIAGNOSIS — M9903 Segmental and somatic dysfunction of lumbar region: Secondary | ICD-10-CM | POA: Diagnosis not present

## 2015-07-03 DIAGNOSIS — M545 Low back pain: Secondary | ICD-10-CM | POA: Diagnosis not present

## 2015-07-08 ENCOUNTER — Ambulatory Visit (INDEPENDENT_AMBULATORY_CARE_PROVIDER_SITE_OTHER): Payer: Medicare Other | Admitting: Internal Medicine

## 2015-07-08 ENCOUNTER — Encounter: Payer: Self-pay | Admitting: Internal Medicine

## 2015-07-08 VITALS — BP 118/78 | HR 68 | Ht 64.0 in | Wt 152.1 lb

## 2015-07-08 DIAGNOSIS — K219 Gastro-esophageal reflux disease without esophagitis: Secondary | ICD-10-CM

## 2015-07-08 DIAGNOSIS — K8071 Calculus of gallbladder and bile duct without cholecystitis with obstruction: Secondary | ICD-10-CM

## 2015-07-08 DIAGNOSIS — R935 Abnormal findings on diagnostic imaging of other abdominal regions, including retroperitoneum: Secondary | ICD-10-CM

## 2015-07-08 DIAGNOSIS — K5732 Diverticulitis of large intestine without perforation or abscess without bleeding: Secondary | ICD-10-CM

## 2015-07-08 NOTE — Patient Instructions (Signed)
Please follow up as needed 

## 2015-07-08 NOTE — Progress Notes (Signed)
HISTORY OF PRESENT ILLNESS:  Charlene [name] is a delightful 81 y.o. female with past medical history as listed below. She is referred today by the inpatient hospitalist, Dr. Broadus John, with chief complaint of diverticulitis. The patient's other complaints include heartburn and gallstones. Review of old records shows that she was seen here remotely for colonoscopy with Dr. Verl Blalock in 2006. I have reviewed that report. The examination was complete with excellent preparation. She was found to have left-sided diverticulosis and small internal hemorrhoids. No routine follow-up recommended. I have reviewed the electronic medical record, inpatient hospital notes, and x-rays/laboratories. Patient was hospitalized twice this summer with acute diverticulitis. Once in mid June and subsequently late July early August. She likely had diverticulitis that did not resolve as opposed to recurrent. She was discharged home 05/08/2015. She completed a course of Augmentin. No problems with pain since that time. She does report regular daily bowel movements without bleeding. She tells me that she had one episode of diverticulitis 2 years ago and 16 years ago. Each treated as an outpatient. Next, the patient reports daily heartburn. This occurs prior to eating. No dysphagia. She does take Pepcid on demand which helps. Finally, she is known to have gallstones. This was discovered after complaining of bloating discomfort (ultrasound July 2013). She did see a Psychologist, sport and exercise, Dr. Grandville Silos, did not feel cholecystectomy was necessary. The patient has not had problems with upper abdominal pain, nausea, vomiting, jaundice, or fevers. She reports good appetite and is quite active. She reports that increased dietary fiber results in increased bowel movements which she finds unsatisfied  REVIEW OF SYSTEMS:  All non-GI ROS negative except for depression, urinary leakage  Past Medical History  Diagnosis Date  . Macular degeneration   . GERD  (gastroesophageal reflux disease)   . Allergy     seasonal  . BPPV (benign paroxysmal positional vertigo) 2012  . Depression   . Urine frequency   . Dyslipidemia   . Diverticulitis   . Acute renal failure ([place])   . Hemorrhoids   . Uterine carcinoma [facility])     Past Surgical History  Procedure Laterality Date  . Abdominal hysterectomy    . Eye surgery      cataract removal bilateral 2012  . Tonsillectomy      Social History Charlene [name]  reports that she quit smoking about 42 years ago. She has never used smokeless tobacco. She reports that she drinks alcohol. She reports that she does not use illicit drugs.  family history includes Cancer in her brother; Diabetes in her father and mother; Heart disease in her brother and mother; Stroke in her father.  No Known Allergies     PHYSICAL EXAMINATION: Vital signs: BP 118/78 mmHg  Pulse 68  Ht 5\' 4"  (1.626 m)  Wt 152 lb 2 oz (69.003 kg)  BMI 26.10 kg/m2  Constitutional: generally well-appearing, no acute distress Psychiatric: alert and oriented x3, cooperative Eyes: extraocular movements intact, anicteric, conjunctiva pink Mouth: oral pharynx moist, no lesions Neck: supple no lymphadenopathy Cardiovascular: heart regular rate and rhythm, no murmur Lungs: clear to auscultation bilaterally Abdomen: soft, nontender, nondistended, no obvious ascites, no peritoneal signs, normal bowel sounds, no organomegaly. Previous surgical incision well-healed Rectal: Ommitted Extremities: no clubbing cyanosis or lower extremity edema bilaterally Skin: no lesions on visible extremities Neuro: No focal deficits. Cranial nerves intact.   ASSESSMENT:  #1. Acute diverticulitis. Uncomplicated. Resolved #2. Known diverticulosis on colonoscopy 2006 #3. GERD without alarm features #4. Gallstones. Incidental at  this point   PLAN:  #1. Discussion on diverticulitis. She had multiple excellent questions answered to her satisfaction. #2. Reflux  precautions. Also, discussion on GERD today #3. Pepcid on demand. Could take regularly since symptoms are daily. She is agreeable #4. Discussed symptoms and may represent symptomatic cholelithiasis. As well discussed potential complications of gallbladder disease per her request. If she were to felt these, I advised her to contact her general surgeon for reevaluation #5. Return to the care of Dr. Virgina Jock. GI follow-up as needed

## 2015-07-09 DIAGNOSIS — M9903 Segmental and somatic dysfunction of lumbar region: Secondary | ICD-10-CM | POA: Diagnosis not present

## 2015-07-09 DIAGNOSIS — M5137 Other intervertebral disc degeneration, lumbosacral region: Secondary | ICD-10-CM | POA: Diagnosis not present

## 2015-07-09 DIAGNOSIS — M47816 Spondylosis without myelopathy or radiculopathy, lumbar region: Secondary | ICD-10-CM | POA: Diagnosis not present

## 2015-07-09 DIAGNOSIS — M545 Low back pain: Secondary | ICD-10-CM | POA: Diagnosis not present

## 2015-07-10 DIAGNOSIS — Z23 Encounter for immunization: Secondary | ICD-10-CM | POA: Diagnosis not present

## 2015-07-12 DIAGNOSIS — M9903 Segmental and somatic dysfunction of lumbar region: Secondary | ICD-10-CM | POA: Diagnosis not present

## 2015-07-12 DIAGNOSIS — M545 Low back pain: Secondary | ICD-10-CM | POA: Diagnosis not present

## 2015-07-12 DIAGNOSIS — M5137 Other intervertebral disc degeneration, lumbosacral region: Secondary | ICD-10-CM | POA: Diagnosis not present

## 2015-07-12 DIAGNOSIS — M47816 Spondylosis without myelopathy or radiculopathy, lumbar region: Secondary | ICD-10-CM | POA: Diagnosis not present

## 2015-07-29 DIAGNOSIS — S92352A Displaced fracture of fifth metatarsal bone, left foot, initial encounter for closed fracture: Secondary | ICD-10-CM | POA: Diagnosis not present

## 2015-08-05 DIAGNOSIS — M5137 Other intervertebral disc degeneration, lumbosacral region: Secondary | ICD-10-CM | POA: Diagnosis not present

## 2015-08-05 DIAGNOSIS — M9903 Segmental and somatic dysfunction of lumbar region: Secondary | ICD-10-CM | POA: Diagnosis not present

## 2015-08-05 DIAGNOSIS — M47816 Spondylosis without myelopathy or radiculopathy, lumbar region: Secondary | ICD-10-CM | POA: Diagnosis not present

## 2015-08-05 DIAGNOSIS — M545 Low back pain: Secondary | ICD-10-CM | POA: Diagnosis not present

## 2015-08-06 DIAGNOSIS — S92352D Displaced fracture of fifth metatarsal bone, left foot, subsequent encounter for fracture with routine healing: Secondary | ICD-10-CM | POA: Diagnosis not present

## 2015-08-08 DIAGNOSIS — M47816 Spondylosis without myelopathy or radiculopathy, lumbar region: Secondary | ICD-10-CM | POA: Diagnosis not present

## 2015-08-08 DIAGNOSIS — M545 Low back pain: Secondary | ICD-10-CM | POA: Diagnosis not present

## 2015-08-08 DIAGNOSIS — M5137 Other intervertebral disc degeneration, lumbosacral region: Secondary | ICD-10-CM | POA: Diagnosis not present

## 2015-08-08 DIAGNOSIS — M9903 Segmental and somatic dysfunction of lumbar region: Secondary | ICD-10-CM | POA: Diagnosis not present

## 2015-08-12 DIAGNOSIS — M545 Low back pain: Secondary | ICD-10-CM | POA: Diagnosis not present

## 2015-08-12 DIAGNOSIS — M47816 Spondylosis without myelopathy or radiculopathy, lumbar region: Secondary | ICD-10-CM | POA: Diagnosis not present

## 2015-08-12 DIAGNOSIS — M5137 Other intervertebral disc degeneration, lumbosacral region: Secondary | ICD-10-CM | POA: Diagnosis not present

## 2015-08-12 DIAGNOSIS — M9903 Segmental and somatic dysfunction of lumbar region: Secondary | ICD-10-CM | POA: Diagnosis not present

## 2015-08-14 DIAGNOSIS — M545 Low back pain: Secondary | ICD-10-CM | POA: Diagnosis not present

## 2015-08-14 DIAGNOSIS — M5137 Other intervertebral disc degeneration, lumbosacral region: Secondary | ICD-10-CM | POA: Diagnosis not present

## 2015-08-14 DIAGNOSIS — M47816 Spondylosis without myelopathy or radiculopathy, lumbar region: Secondary | ICD-10-CM | POA: Diagnosis not present

## 2015-08-14 DIAGNOSIS — M9903 Segmental and somatic dysfunction of lumbar region: Secondary | ICD-10-CM | POA: Diagnosis not present

## 2015-08-15 DIAGNOSIS — M47816 Spondylosis without myelopathy or radiculopathy, lumbar region: Secondary | ICD-10-CM | POA: Diagnosis not present

## 2015-08-15 DIAGNOSIS — M545 Low back pain: Secondary | ICD-10-CM | POA: Diagnosis not present

## 2015-08-15 DIAGNOSIS — M9903 Segmental and somatic dysfunction of lumbar region: Secondary | ICD-10-CM | POA: Diagnosis not present

## 2015-08-15 DIAGNOSIS — M5137 Other intervertebral disc degeneration, lumbosacral region: Secondary | ICD-10-CM | POA: Diagnosis not present

## 2015-08-21 DIAGNOSIS — S92352D Displaced fracture of fifth metatarsal bone, left foot, subsequent encounter for fracture with routine healing: Secondary | ICD-10-CM | POA: Diagnosis not present

## 2015-09-03 DIAGNOSIS — S92352D Displaced fracture of fifth metatarsal bone, left foot, subsequent encounter for fracture with routine healing: Secondary | ICD-10-CM | POA: Diagnosis not present

## 2015-09-17 DIAGNOSIS — S92352D Displaced fracture of fifth metatarsal bone, left foot, subsequent encounter for fracture with routine healing: Secondary | ICD-10-CM | POA: Diagnosis not present

## 2015-10-02 DIAGNOSIS — S92352D Displaced fracture of fifth metatarsal bone, left foot, subsequent encounter for fracture with routine healing: Secondary | ICD-10-CM | POA: Diagnosis not present

## 2015-11-12 DIAGNOSIS — H35363 Drusen (degenerative) of macula, bilateral: Secondary | ICD-10-CM | POA: Diagnosis not present

## 2015-11-12 DIAGNOSIS — H3570 Unspecified separation of retinal layers: Secondary | ICD-10-CM | POA: Diagnosis not present

## 2015-11-12 DIAGNOSIS — H353222 Exudative age-related macular degeneration, left eye, with inactive choroidal neovascularization: Secondary | ICD-10-CM | POA: Diagnosis not present

## 2015-11-12 DIAGNOSIS — H353134 Nonexudative age-related macular degeneration, bilateral, advanced atrophic with subfoveal involvement: Secondary | ICD-10-CM | POA: Diagnosis not present

## 2015-12-11 DIAGNOSIS — M9902 Segmental and somatic dysfunction of thoracic region: Secondary | ICD-10-CM | POA: Diagnosis not present

## 2015-12-11 DIAGNOSIS — M546 Pain in thoracic spine: Secondary | ICD-10-CM | POA: Diagnosis not present

## 2015-12-11 DIAGNOSIS — M542 Cervicalgia: Secondary | ICD-10-CM | POA: Diagnosis not present

## 2015-12-11 DIAGNOSIS — M9901 Segmental and somatic dysfunction of cervical region: Secondary | ICD-10-CM | POA: Diagnosis not present

## 2015-12-12 DIAGNOSIS — M542 Cervicalgia: Secondary | ICD-10-CM | POA: Diagnosis not present

## 2015-12-12 DIAGNOSIS — M9902 Segmental and somatic dysfunction of thoracic region: Secondary | ICD-10-CM | POA: Diagnosis not present

## 2015-12-12 DIAGNOSIS — M9901 Segmental and somatic dysfunction of cervical region: Secondary | ICD-10-CM | POA: Diagnosis not present

## 2015-12-12 DIAGNOSIS — M546 Pain in thoracic spine: Secondary | ICD-10-CM | POA: Diagnosis not present

## 2015-12-16 DIAGNOSIS — M9902 Segmental and somatic dysfunction of thoracic region: Secondary | ICD-10-CM | POA: Diagnosis not present

## 2015-12-16 DIAGNOSIS — M546 Pain in thoracic spine: Secondary | ICD-10-CM | POA: Diagnosis not present

## 2015-12-16 DIAGNOSIS — M542 Cervicalgia: Secondary | ICD-10-CM | POA: Diagnosis not present

## 2015-12-16 DIAGNOSIS — M9901 Segmental and somatic dysfunction of cervical region: Secondary | ICD-10-CM | POA: Diagnosis not present

## 2015-12-19 DIAGNOSIS — E7800 Pure hypercholesterolemia, unspecified: Secondary | ICD-10-CM | POA: Diagnosis not present

## 2015-12-19 DIAGNOSIS — M546 Pain in thoracic spine: Secondary | ICD-10-CM | POA: Diagnosis not present

## 2015-12-19 DIAGNOSIS — E559 Vitamin D deficiency, unspecified: Secondary | ICD-10-CM | POA: Diagnosis not present

## 2015-12-19 DIAGNOSIS — R8290 Unspecified abnormal findings in urine: Secondary | ICD-10-CM | POA: Diagnosis not present

## 2015-12-19 DIAGNOSIS — M9902 Segmental and somatic dysfunction of thoracic region: Secondary | ICD-10-CM | POA: Diagnosis not present

## 2015-12-19 DIAGNOSIS — N390 Urinary tract infection, site not specified: Secondary | ICD-10-CM | POA: Diagnosis not present

## 2015-12-19 DIAGNOSIS — E781 Pure hyperglyceridemia: Secondary | ICD-10-CM | POA: Diagnosis not present

## 2015-12-19 DIAGNOSIS — R030 Elevated blood-pressure reading, without diagnosis of hypertension: Secondary | ICD-10-CM | POA: Diagnosis not present

## 2015-12-19 DIAGNOSIS — M542 Cervicalgia: Secondary | ICD-10-CM | POA: Diagnosis not present

## 2015-12-19 DIAGNOSIS — M9901 Segmental and somatic dysfunction of cervical region: Secondary | ICD-10-CM | POA: Diagnosis not present

## 2015-12-24 DIAGNOSIS — M542 Cervicalgia: Secondary | ICD-10-CM | POA: Diagnosis not present

## 2015-12-24 DIAGNOSIS — M9902 Segmental and somatic dysfunction of thoracic region: Secondary | ICD-10-CM | POA: Diagnosis not present

## 2015-12-24 DIAGNOSIS — M9901 Segmental and somatic dysfunction of cervical region: Secondary | ICD-10-CM | POA: Diagnosis not present

## 2015-12-24 DIAGNOSIS — M546 Pain in thoracic spine: Secondary | ICD-10-CM | POA: Diagnosis not present

## 2015-12-26 DIAGNOSIS — D6489 Other specified anemias: Secondary | ICD-10-CM | POA: Diagnosis not present

## 2015-12-26 DIAGNOSIS — I700 Atherosclerosis of aorta: Secondary | ICD-10-CM | POA: Diagnosis not present

## 2015-12-26 DIAGNOSIS — Z0000 Encounter for general adult medical examination without abnormal findings: Secondary | ICD-10-CM | POA: Diagnosis not present

## 2015-12-26 DIAGNOSIS — K5730 Diverticulosis of large intestine without perforation or abscess without bleeding: Secondary | ICD-10-CM | POA: Diagnosis not present

## 2015-12-26 DIAGNOSIS — H3530 Unspecified macular degeneration: Secondary | ICD-10-CM | POA: Diagnosis not present

## 2015-12-26 DIAGNOSIS — E559 Vitamin D deficiency, unspecified: Secondary | ICD-10-CM | POA: Diagnosis not present

## 2015-12-26 DIAGNOSIS — Z1389 Encounter for screening for other disorder: Secondary | ICD-10-CM | POA: Diagnosis not present

## 2015-12-26 DIAGNOSIS — R030 Elevated blood-pressure reading, without diagnosis of hypertension: Secondary | ICD-10-CM | POA: Diagnosis not present

## 2015-12-26 DIAGNOSIS — K829 Disease of gallbladder, unspecified: Secondary | ICD-10-CM | POA: Diagnosis not present

## 2016-01-26 ENCOUNTER — Encounter (HOSPITAL_COMMUNITY): Payer: Self-pay | Admitting: Nurse Practitioner

## 2016-01-26 ENCOUNTER — Emergency Department (HOSPITAL_COMMUNITY): Payer: Medicare Other

## 2016-01-26 ENCOUNTER — Emergency Department (HOSPITAL_COMMUNITY)
Admission: EM | Admit: 2016-01-26 | Discharge: 2016-01-26 | Disposition: A | Discharge status: Home / Self Care | Payer: Medicare Other | Attending: Emergency Medicine | Admitting: Emergency Medicine

## 2016-01-26 DIAGNOSIS — K5732 Diverticulitis of large intestine without perforation or abscess without bleeding: Secondary | ICD-10-CM | POA: Diagnosis not present

## 2016-01-26 DIAGNOSIS — R109 Unspecified abdominal pain: Secondary | ICD-10-CM | POA: Diagnosis present

## 2016-01-26 DIAGNOSIS — K5792 Diverticulitis of intestine, part unspecified, without perforation or abscess without bleeding: Secondary | ICD-10-CM | POA: Insufficient documentation

## 2016-01-26 DIAGNOSIS — Z87891 Personal history of nicotine dependence: Secondary | ICD-10-CM | POA: Insufficient documentation

## 2016-01-26 DIAGNOSIS — E785 Hyperlipidemia, unspecified: Secondary | ICD-10-CM | POA: Diagnosis not present

## 2016-01-26 DIAGNOSIS — Z79899 Other long term (current) drug therapy: Secondary | ICD-10-CM | POA: Diagnosis not present

## 2016-01-26 DIAGNOSIS — Z87448 Personal history of other diseases of urinary system: Secondary | ICD-10-CM | POA: Insufficient documentation

## 2016-01-26 DIAGNOSIS — Z8669 Personal history of other diseases of the nervous system and sense organs: Secondary | ICD-10-CM | POA: Diagnosis not present

## 2016-01-26 DIAGNOSIS — F329 Major depressive disorder, single episode, unspecified: Secondary | ICD-10-CM | POA: Insufficient documentation

## 2016-01-26 LAB — COMPREHENSIVE METABOLIC PANEL
ALT: 12 U/L — ABNORMAL LOW (ref 14–54)
AST: 17 U/L (ref 15–41)
Albumin: 2.9 g/dL — ABNORMAL LOW (ref 3.5–5.0)
Alkaline Phosphatase: 48 U/L (ref 38–126)
Anion gap: 10 (ref 5–15)
BUN: 12 mg/dL (ref 6–20)
CHLORIDE: 105 mmol/L (ref 101–111)
CO2: 23 mmol/L (ref 22–32)
CREATININE: 1.04 mg/dL — AB (ref 0.44–1.00)
Calcium: 9.2 mg/dL (ref 8.9–10.3)
GFR calc non Af Amer: 48 mL/min — ABNORMAL LOW (ref >60)
GFR, EST AFRICAN AMERICAN: 55 mL/min — AB (ref >60)
Glucose, Bld: 104 mg/dL — ABNORMAL HIGH (ref 65–99)
POTASSIUM: 4.7 mmol/L (ref 3.5–5.1)
SODIUM: 138 mmol/L (ref 135–145)
Total Bilirubin: 0.7 mg/dL (ref 0.3–1.2)
Total Protein: 6.8 g/dL (ref 6.5–8.1)

## 2016-01-26 LAB — URINALYSIS, ROUTINE W REFLEX MICROSCOPIC
Bilirubin Urine: NEGATIVE
Glucose, UA: NEGATIVE mg/dL
HGB URINE DIPSTICK: NEGATIVE
Ketones, ur: 15 mg/dL — AB
LEUKOCYTES UA: NEGATIVE
Nitrite: NEGATIVE
PH: 6.5 (ref 5.0–8.0)
Protein, ur: NEGATIVE mg/dL
Specific Gravity, Urine: 1.027 (ref 1.005–1.030)

## 2016-01-26 LAB — CBC
HEMATOCRIT: 34.1 % — AB (ref 36.0–46.0)
HEMOGLOBIN: 10.6 g/dL — AB (ref 12.0–15.0)
MCH: 26.2 pg (ref 26.0–34.0)
MCHC: 31.1 g/dL (ref 30.0–36.0)
MCV: 84.2 fL (ref 78.0–100.0)
PLATELETS: 383 10*3/uL (ref 150–400)
RBC: 4.05 MIL/uL (ref 3.87–5.11)
RDW: 14.2 % (ref 11.5–15.5)
WBC: 15.3 10*3/uL — AB (ref 4.0–10.5)

## 2016-01-26 MED ORDER — IOPAMIDOL (ISOVUE-300) INJECTION 61%
INTRAVENOUS | Status: AC
  Administered 2016-01-26: 75 mL
  Filled 2016-01-26: qty 75

## 2016-01-26 MED ORDER — AMOXICILLIN-POT CLAVULANATE 875-125 MG PO TABS
1.0000 | ORAL_TABLET | Freq: Once | ORAL | Status: AC
  Administered 2016-01-26: 1 via ORAL
  Filled 2016-01-26: qty 1

## 2016-01-26 MED ORDER — HYDROCODONE-ACETAMINOPHEN 5-325 MG PO TABS: 0.5000 | ORAL_TABLET | Freq: Four times a day (QID) | ORAL | Status: DC | PRN

## 2016-01-26 MED ORDER — SODIUM CHLORIDE 0.9 % IV BOLUS (SEPSIS)
500.0000 mL | Freq: Once | INTRAVENOUS | Status: AC
  Administered 2016-01-26: 500 mL via INTRAVENOUS

## 2016-01-26 MED ORDER — MORPHINE SULFATE (PF) 2 MG/ML IV SOLN
2.0000 mg | Freq: Once | INTRAVENOUS | Status: AC
  Administered 2016-01-26: 2 mg via INTRAVENOUS
  Filled 2016-01-26: qty 1

## 2016-01-26 MED ORDER — AMOXICILLIN-POT CLAVULANATE 875-125 MG PO TABS: 1.0000 | ORAL_TABLET | Freq: Two times a day (BID) | ORAL | Status: DC

## 2016-01-26 NOTE — ED Notes (Signed)
She c/o 4 day history of intermittent lower abd pain. She describes as cramps, reminds her of menstrual cramps. She reports intermittent nausea but no vomiting. She has had some chills and loss of appetite. She denies any bowel, bladder changes.

## 2016-01-26 NOTE — Discharge Instructions (Signed)
Return to the ER immediately if your abdominal pain worsens or does not improve. Also return if you develop vomiting, inability to take your medicines or fevers. Otherwise follow up with your doctor in 2 days. Call your GI doctor (Dr. Henrene Pastor) for follow up and possible colonoscopy

## 2016-01-26 NOTE — ED Provider Notes (Signed)
CSN: [id number]     Arrival date & time 01/26/16  1222 History   First MD Initiated Contact with Patient 01/26/16 1237     Chief Complaint  Patient presents with  . Abdominal Pain     (Consider location/radiation/quality/duration/timing/severity/associated sxs/prior Treatment) HPI  82 year old female presents with lower abdominal pain that started 4 days ago. Pain is about a 5-6 out of 10. Feels like a cramping sensation that reminds her of menstrual cramps. No urinary symptoms such as dysuria or hematuria. No vaginal bleeding. She states when she had a bowel movement this morning her pain seemed to worsen. No bleeding in her stool. Has felt intermittently nauseated. No vomiting. Not currently nauseated. No chest pain or back pain. Has a history of diverticulitis in the past but that was isolated left lower quadrant pain and feels different than this.  Past Medical History  Diagnosis Date  . Macular degeneration   . GERD (gastroesophageal reflux disease)   . Allergy     seasonal  . BPPV (benign paroxysmal positional vertigo) 2012  . Depression   . Urine frequency   . Dyslipidemia   . Diverticulitis   . Acute renal failure ([place])   . Hemorrhoids   . Uterine carcinoma [facility])    Past Surgical History  Procedure Laterality Date  . Abdominal hysterectomy    . Eye surgery      cataract removal bilateral 2012  . Tonsillectomy     Family History  Problem Relation Age of Onset  . Heart disease Mother   . Stroke Father   . Cancer Brother     lung cancer  . Heart disease Brother   . Diabetes Mother   . Diabetes Father    Social History  Substance Use Topics  . Smoking status: Former Smoker    Quit date: 01/19/1973  . Smokeless tobacco: Never Used  . Alcohol Use: Yes     Comment: occ   OB History    No data available     Review of Systems  Constitutional: Negative for fever.  Gastrointestinal: Positive for nausea and abdominal pain. Negative for vomiting, diarrhea,  constipation and blood in stool.  Genitourinary: Negative for dysuria, hematuria and vaginal bleeding.  All other systems reviewed and are negative.     Allergies  Review of patient's allergies indicates no known allergies.  Home Medications   Prior to Admission medications   Medication Sig Start Date End Date Taking? Authorizing Provider  Multiple Vitamins-Minerals (MULTIVITAMIN ADULT PO) Take by mouth.    Historical Provider, MD  Multiple Vitamins-Minerals (PRESERVISION AREDS PO) Take 1 tablet by mouth 2 (two) times daily.    Historical Provider, MD  omega-3 acid ethyl esters (LOVAZA) 1 G capsule Take 1 g by mouth daily.     Historical Provider, MD  triamcinolone (NASACORT AQ) 55 MCG/ACT AERO nasal inhaler Place 2 sprays into the nose daily as needed (for allergies).    Historical Provider, MD   BP 140/54 mmHg  Pulse 71  Temp(Src) 97.7 F (36.5 C) (Oral)  Resp 14  SpO2 96% Physical Exam  Constitutional: She is oriented to person, place, and time. She appears well-developed and well-nourished. No distress.  HENT:  Head: Normocephalic and atraumatic.  Right Ear: External ear normal.  Left Ear: External ear normal.  Nose: Nose normal.  Eyes: Right eye exhibits no discharge. Left eye exhibits no discharge.  Cardiovascular: Normal rate, regular rhythm and normal heart sounds.   Pulmonary/Chest: Effort normal and breath sounds  normal.  Abdominal: Soft. There is tenderness in the right lower quadrant and left lower quadrant. There is guarding. There is no rigidity.  Neurological: She is alert and oriented to person, place, and time.  Skin: Skin is warm and dry. She is not diaphoretic.  Nursing note and vitals reviewed.   ED Course  Procedures (including critical care time) Labs Review Labs Reviewed  COMPREHENSIVE METABOLIC PANEL - Abnormal; Notable for the following:    Glucose, Bld 104 (*)    Creatinine, Ser 1.04 (*)    Albumin 2.9 (*)    ALT 12 (*)    GFR calc non Af  Amer 48 (*)    GFR calc Af Amer 55 (*)    All other components within normal limits  CBC - Abnormal; Notable for the following:    WBC 15.3 (*)    Hemoglobin 10.6 (*)    HCT 34.1 (*)    All other components within normal limits  URINALYSIS, ROUTINE W REFLEX MICROSCOPIC (NOT AT [facility]) - Abnormal; Notable for the following:    Ketones, ur 15 (*)    All other components within normal limits    Imaging Review Ct Abdomen Pelvis W Contrast  01/26/2016  CLINICAL DATA:  LOWER ABD AND PELVIC PAIN WITH OCCASIONAL NAUSEA SINCE Wednesday (3DAYS) HX UTERINE CA WITH HYSTERECTOMY 15 YEARS PRIOR EXAM: CT ABDOMEN AND PELVIS WITH CONTRAST TECHNIQUE: Multidetector CT imaging of the abdomen and pelvis was performed using the standard protocol following bolus administration of intravenous contrast. CONTRAST:  45mL ISOVUE-300 IOPAMIDOL (ISOVUE-300) INJECTION 61% COMPARISON:  CT 05/05/2015 FINDINGS: Lower chest: Lung bases are clear. Hepatobiliary: No focal hepatic lesion. No biliary duct dilatation. Small gallstones in lumen gallbladder. Common bile duct is normal. Pancreas: No pancreatic duct dilatation.  No pancreatic inflammation Spleen: Normal spleen Adrenals/urinary tract: Low-density lesions in the kidneys wiht simple fluid attenuation ureters and bladder normal. Stomach/Bowel: Stomach, small-bowel appendix and cecum are normal. Multiple diverticula of the sigmoid colon. There is a region of circumferential bowel wall thickening and pericolonic inflammation in the mid sigmoid colon (image 57, series 2). This most suggestive acute diverticulitis. Similar diverticulitis pattern on CT of 05/05/2015 slightly more distally in the sigmoid colon. No evidence of perforation or abscess. Vascular/Lymphatic: Abdominal aorta is normal caliber with atherosclerotic calcification. There is no retroperitoneal or periportal lymphadenopathy. No pelvic lymphadenopathy. Reproductive: Post hysterectomy. Other: Small free fluid the pelvis  related to diverticulitis Musculoskeletal: Degenerate changes of the spine. IMPRESSION: 1. Acute sigmoid diverticulitis similar to comparison CT of 05/05/2015 but a slightly more proximal location. As there is circumferential narrowing of the sigmoid colon at this level, recommend follow-up CT with contrast to exclude unlikely underlying neoplasm. 2. Small  free fluid in the pelvis related to diverticulitis. 3.  Atherosclerotic calcification of the abdominal aorta. 4. Low-density lesions in the kidneys likely represent benign cysts. Electronically Signed   By: Suzy Bouchard M.D.   On: 01/26/2016 14:54   I have personally reviewed and evaluated these images and lab results as part of my medical decision-making.   EKG Interpretation None      MDM   Final diagnoses:  Acute diverticulitis    CT shows uncomplicated acute diverticulitis. Patient's pain is very well controlled and she wants to go home. I discussed admission based on her age and elevated white blood cell count but she prefers to go home and agrees to come back if her abdominal pain worsens or does not improve, if she is unable to  tolerate her oral meds, vomiting, or fevers. Agrees to follow-up closely with PCP. She did not tolerate metronidazole well last time and was discharged on Augmentin. I will give her Augmentin for home as well. Tolerated well here.    Sherwood Gambler, MD 01/26/16 [phone]

## 2016-01-30 ENCOUNTER — Inpatient Hospital Stay (HOSPITAL_COMMUNITY)
Admission: EM | Admit: 2016-01-30 | Discharge: 2016-02-02 | DRG: 391 | Disposition: A | Discharge status: Home / Self Care | Payer: Medicare Other | Attending: Internal Medicine | Admitting: Internal Medicine

## 2016-01-30 ENCOUNTER — Emergency Department (HOSPITAL_COMMUNITY): Payer: Medicare Other

## 2016-01-30 ENCOUNTER — Encounter (HOSPITAL_COMMUNITY): Payer: Self-pay | Admitting: *Deleted

## 2016-01-30 DIAGNOSIS — K5792 Diverticulitis of intestine, part unspecified, without perforation or abscess without bleeding: Secondary | ICD-10-CM | POA: Diagnosis present

## 2016-01-30 DIAGNOSIS — Z87891 Personal history of nicotine dependence: Secondary | ICD-10-CM

## 2016-01-30 DIAGNOSIS — E43 Unspecified severe protein-calorie malnutrition: Secondary | ICD-10-CM | POA: Diagnosis present

## 2016-01-30 DIAGNOSIS — Z6824 Body mass index (BMI) 24.0-24.9, adult: Secondary | ICD-10-CM

## 2016-01-30 DIAGNOSIS — Z8249 Family history of ischemic heart disease and other diseases of the circulatory system: Secondary | ICD-10-CM

## 2016-01-30 DIAGNOSIS — F32A Depression, unspecified: Secondary | ICD-10-CM | POA: Diagnosis present

## 2016-01-30 DIAGNOSIS — Z79899 Other long term (current) drug therapy: Secondary | ICD-10-CM

## 2016-01-30 DIAGNOSIS — D473 Essential (hemorrhagic) thrombocythemia: Secondary | ICD-10-CM | POA: Diagnosis present

## 2016-01-30 DIAGNOSIS — Z91048 Other nonmedicinal substance allergy status: Secondary | ICD-10-CM

## 2016-01-30 DIAGNOSIS — K5732 Diverticulitis of large intestine without perforation or abscess without bleeding: Principal | ICD-10-CM | POA: Diagnosis present

## 2016-01-30 DIAGNOSIS — K219 Gastro-esophageal reflux disease without esophagitis: Secondary | ICD-10-CM | POA: Diagnosis present

## 2016-01-30 DIAGNOSIS — K649 Unspecified hemorrhoids: Secondary | ICD-10-CM | POA: Diagnosis present

## 2016-01-30 DIAGNOSIS — E785 Hyperlipidemia, unspecified: Secondary | ICD-10-CM | POA: Diagnosis present

## 2016-01-30 DIAGNOSIS — R197 Diarrhea, unspecified: Secondary | ICD-10-CM | POA: Diagnosis present

## 2016-01-30 DIAGNOSIS — D649 Anemia, unspecified: Secondary | ICD-10-CM | POA: Diagnosis present

## 2016-01-30 DIAGNOSIS — F329 Major depressive disorder, single episode, unspecified: Secondary | ICD-10-CM | POA: Diagnosis present

## 2016-01-30 DIAGNOSIS — H3530 Unspecified macular degeneration: Secondary | ICD-10-CM | POA: Diagnosis present

## 2016-01-30 DIAGNOSIS — R1031 Right lower quadrant pain: Secondary | ICD-10-CM | POA: Diagnosis not present

## 2016-01-30 DIAGNOSIS — R0602 Shortness of breath: Secondary | ICD-10-CM | POA: Diagnosis not present

## 2016-01-30 DIAGNOSIS — Z8542 Personal history of malignant neoplasm of other parts of uterus: Secondary | ICD-10-CM

## 2016-01-30 LAB — URINALYSIS, ROUTINE W REFLEX MICROSCOPIC
Bilirubin Urine: NEGATIVE
GLUCOSE, UA: NEGATIVE mg/dL
Hgb urine dipstick: NEGATIVE
KETONES UR: 40 mg/dL — AB
NITRITE: NEGATIVE
PROTEIN: 30 mg/dL — AB
Specific Gravity, Urine: 1.022 (ref 1.005–1.030)
pH: 6.5 (ref 5.0–8.0)

## 2016-01-30 LAB — COMPREHENSIVE METABOLIC PANEL WITH GFR
ALT: 13 U/L — ABNORMAL LOW (ref 14–54)
AST: 18 U/L (ref 15–41)
Albumin: 3.2 g/dL — ABNORMAL LOW (ref 3.5–5.0)
Alkaline Phosphatase: 52 U/L (ref 38–126)
Anion gap: 10 (ref 5–15)
BUN: 11 mg/dL (ref 6–20)
CO2: 22 mmol/L (ref 22–32)
Calcium: 9.2 mg/dL (ref 8.9–10.3)
Chloride: 104 mmol/L (ref 101–111)
Creatinine, Ser: 0.99 mg/dL (ref 0.44–1.00)
GFR calc Af Amer: 59 mL/min — ABNORMAL LOW
GFR calc non Af Amer: 51 mL/min — ABNORMAL LOW
Glucose, Bld: 104 mg/dL — ABNORMAL HIGH (ref 65–99)
Potassium: 4.8 mmol/L (ref 3.5–5.1)
Sodium: 136 mmol/L (ref 135–145)
Total Bilirubin: 0.2 mg/dL — ABNORMAL LOW (ref 0.3–1.2)
Total Protein: 7 g/dL (ref 6.5–8.1)

## 2016-01-30 LAB — URINE MICROSCOPIC-ADD ON

## 2016-01-30 LAB — CBC
HCT: 33.6 % — ABNORMAL LOW (ref 36.0–46.0)
Hemoglobin: 10.9 g/dL — ABNORMAL LOW (ref 12.0–15.0)
MCH: 27 pg (ref 26.0–34.0)
MCHC: 32.4 g/dL (ref 30.0–36.0)
MCV: 83.4 fL (ref 78.0–100.0)
PLATELETS: 460 10*3/uL — AB (ref 150–400)
RBC: 4.03 MIL/uL (ref 3.87–5.11)
RDW: 13.7 % (ref 11.5–15.5)
WBC: 16.2 10*3/uL — AB (ref 4.0–10.5)

## 2016-01-30 LAB — LIPASE, BLOOD: Lipase: 41 U/L (ref 11–51)

## 2016-01-30 MED ORDER — MORPHINE SULFATE (PF) 4 MG/ML IV SOLN
4.0000 mg | Freq: Once | INTRAVENOUS | Status: AC
  Administered 2016-01-30: 4 mg via INTRAVENOUS
  Filled 2016-01-30: qty 1

## 2016-01-30 MED ORDER — METRONIDAZOLE IN NACL 5-0.79 MG/ML-% IV SOLN
500.0000 mg | Freq: Once | INTRAVENOUS | Status: AC
  Administered 2016-01-30: 500 mg via INTRAVENOUS
  Filled 2016-01-30: qty 100

## 2016-01-30 MED ORDER — CIPROFLOXACIN IN D5W 400 MG/200ML IV SOLN
400.0000 mg | Freq: Once | INTRAVENOUS | Status: AC
Start: 2016-01-30 — End: 2016-01-30
  Administered 2016-01-30: 400 mg via INTRAVENOUS
  Filled 2016-01-30: qty 200

## 2016-01-30 MED ORDER — FENTANYL CITRATE (PF) 100 MCG/2ML IJ SOLN
50.0000 ug | Freq: Once | INTRAMUSCULAR | Status: AC
Start: 2016-01-30 — End: 2016-01-30
  Administered 2016-01-30: 50 ug via INTRAVENOUS
  Filled 2016-01-30: qty 2

## 2016-01-30 MED ORDER — SODIUM CHLORIDE 0.9 % IV BOLUS (SEPSIS)
1000.0000 mL | Freq: Once | INTRAVENOUS | Status: AC
  Administered 2016-01-30: 1000 mL via INTRAVENOUS

## 2016-01-30 MED ORDER — ONDANSETRON HCL 4 MG/2ML IJ SOLN
4.0000 mg | Freq: Once | INTRAMUSCULAR | Status: AC
  Administered 2016-01-30: 4 mg via INTRAVENOUS
  Filled 2016-01-30: qty 2

## 2016-01-30 MED ORDER — IOPAMIDOL (ISOVUE-300) INJECTION 61%
INTRAVENOUS | Status: AC
  Administered 2016-01-30: 100 mL
  Filled 2016-01-30: qty 100

## 2016-01-30 NOTE — ED Notes (Signed)
Pt reports being seen earlier in the week and being diagnosed with diverticulitis and antibiotics. Pt states that she began having diarrhea and continued pain.

## 2016-01-31 ENCOUNTER — Encounter (HOSPITAL_COMMUNITY): Payer: Self-pay | Admitting: Family Medicine

## 2016-01-31 DIAGNOSIS — D473 Essential (hemorrhagic) thrombocythemia: Secondary | ICD-10-CM | POA: Diagnosis present

## 2016-01-31 DIAGNOSIS — R197 Diarrhea, unspecified: Secondary | ICD-10-CM | POA: Diagnosis not present

## 2016-01-31 DIAGNOSIS — K219 Gastro-esophageal reflux disease without esophagitis: Secondary | ICD-10-CM | POA: Diagnosis present

## 2016-01-31 DIAGNOSIS — E43 Unspecified severe protein-calorie malnutrition: Secondary | ICD-10-CM | POA: Diagnosis present

## 2016-01-31 DIAGNOSIS — Z91048 Other nonmedicinal substance allergy status: Secondary | ICD-10-CM | POA: Diagnosis not present

## 2016-01-31 DIAGNOSIS — K5732 Diverticulitis of large intestine without perforation or abscess without bleeding: Secondary | ICD-10-CM | POA: Diagnosis present

## 2016-01-31 DIAGNOSIS — F329 Major depressive disorder, single episode, unspecified: Secondary | ICD-10-CM | POA: Diagnosis present

## 2016-01-31 DIAGNOSIS — R1031 Right lower quadrant pain: Secondary | ICD-10-CM | POA: Diagnosis not present

## 2016-01-31 DIAGNOSIS — K649 Unspecified hemorrhoids: Secondary | ICD-10-CM | POA: Diagnosis present

## 2016-01-31 DIAGNOSIS — K5792 Diverticulitis of intestine, part unspecified, without perforation or abscess without bleeding: Secondary | ICD-10-CM

## 2016-01-31 DIAGNOSIS — Z79899 Other long term (current) drug therapy: Secondary | ICD-10-CM | POA: Diagnosis not present

## 2016-01-31 DIAGNOSIS — Z8542 Personal history of malignant neoplasm of other parts of uterus: Secondary | ICD-10-CM | POA: Diagnosis not present

## 2016-01-31 DIAGNOSIS — F32A Depression, unspecified: Secondary | ICD-10-CM | POA: Diagnosis present

## 2016-01-31 DIAGNOSIS — D649 Anemia, unspecified: Secondary | ICD-10-CM | POA: Diagnosis present

## 2016-01-31 DIAGNOSIS — Z87891 Personal history of nicotine dependence: Secondary | ICD-10-CM | POA: Diagnosis not present

## 2016-01-31 DIAGNOSIS — E785 Hyperlipidemia, unspecified: Secondary | ICD-10-CM | POA: Diagnosis present

## 2016-01-31 DIAGNOSIS — H3530 Unspecified macular degeneration: Secondary | ICD-10-CM | POA: Diagnosis present

## 2016-01-31 DIAGNOSIS — Z8249 Family history of ischemic heart disease and other diseases of the circulatory system: Secondary | ICD-10-CM | POA: Diagnosis not present

## 2016-01-31 DIAGNOSIS — R0602 Shortness of breath: Secondary | ICD-10-CM | POA: Diagnosis not present

## 2016-01-31 DIAGNOSIS — Z6824 Body mass index (BMI) 24.0-24.9, adult: Secondary | ICD-10-CM | POA: Diagnosis not present

## 2016-01-31 LAB — PROCALCITONIN

## 2016-01-31 LAB — GLUCOSE, CAPILLARY: Glucose-Capillary: 89 mg/dL (ref 65–99)

## 2016-01-31 MED ORDER — ADULT MULTIVITAMIN W/MINERALS CH
1.0000 | ORAL_TABLET | Freq: Every day | ORAL | Status: DC
  Administered 2016-01-31 – 2016-02-02 (×3): 1 via ORAL
  Filled 2016-01-31 (×3): qty 1

## 2016-01-31 MED ORDER — HYDROCODONE-ACETAMINOPHEN 5-325 MG PO TABS: 0.5000 | ORAL_TABLET | Freq: Four times a day (QID) | ORAL | Status: DC | PRN

## 2016-01-31 MED ORDER — ESCITALOPRAM OXALATE 10 MG PO TABS
5.0000 mg | ORAL_TABLET | Freq: Every day | ORAL | Status: DC
  Filled 2016-01-31: qty 1

## 2016-01-31 MED ORDER — HYDROMORPHONE HCL 1 MG/ML IJ SOLN
1.0000 mg | INTRAMUSCULAR | Status: DC | PRN
  Administered 2016-01-31: 1 mg via INTRAVENOUS
  Filled 2016-01-31 (×2): qty 1

## 2016-01-31 MED ORDER — HYDROCORTISONE 1 % EX CREA
TOPICAL_CREAM | Freq: Four times a day (QID) | CUTANEOUS | Status: DC | PRN
  Filled 2016-01-31: qty 28

## 2016-01-31 MED ORDER — CIPROFLOXACIN IN D5W 400 MG/200ML IV SOLN
400.0000 mg | Freq: Two times a day (BID) | INTRAVENOUS | Status: DC
  Administered 2016-01-31 – 2016-02-01 (×4): 400 mg via INTRAVENOUS
  Filled 2016-01-31 (×6): qty 200

## 2016-01-31 MED ORDER — METRONIDAZOLE IN NACL 5-0.79 MG/ML-% IV SOLN
500.0000 mg | Freq: Three times a day (TID) | INTRAVENOUS | Status: DC
  Administered 2016-01-31 – 2016-02-02 (×7): 500 mg via INTRAVENOUS
  Filled 2016-01-31 (×10): qty 100

## 2016-01-31 MED ORDER — ACETAMINOPHEN 650 MG RE SUPP: 650.0000 mg | Freq: Four times a day (QID) | RECTAL | Status: DC | PRN

## 2016-01-31 MED ORDER — ONDANSETRON HCL 4 MG/2ML IJ SOLN
4.0000 mg | Freq: Three times a day (TID) | INTRAMUSCULAR | Status: DC | PRN
Start: 2016-01-31 — End: 2016-02-02
  Administered 2016-01-31: 4 mg via INTRAVENOUS
  Filled 2016-01-31: qty 2

## 2016-01-31 MED ORDER — ENOXAPARIN SODIUM 40 MG/0.4ML ~~LOC~~ SOLN
40.0000 mg | Freq: Every day | SUBCUTANEOUS | Status: DC
  Administered 2016-01-31 – 2016-02-02 (×3): 40 mg via SUBCUTANEOUS
  Filled 2016-01-31 (×3): qty 0.4

## 2016-01-31 MED ORDER — ESCITALOPRAM OXALATE 10 MG PO TABS
5.0000 mg | ORAL_TABLET | Freq: Every day | ORAL | Status: DC
  Administered 2016-01-31 – 2016-02-01 (×3): 5 mg via ORAL
  Filled 2016-01-31 (×2): qty 1

## 2016-01-31 MED ORDER — ACETAMINOPHEN 325 MG PO TABS: 650.0000 mg | ORAL_TABLET | Freq: Four times a day (QID) | ORAL | Status: DC | PRN

## 2016-01-31 MED ORDER — TRIAMCINOLONE ACETONIDE 55 MCG/ACT NA AERO: 2.0000 | INHALATION_SPRAY | Freq: Every day | NASAL | Status: DC | PRN

## 2016-01-31 MED ORDER — FAMOTIDINE 20 MG PO TABS
10.0000 mg | ORAL_TABLET | Freq: Two times a day (BID) | ORAL | Status: DC | PRN
  Administered 2016-01-31 – 2016-02-01 (×2): 10 mg via ORAL
  Filled 2016-01-31 (×2): qty 1

## 2016-01-31 MED ORDER — ENSURE ENLIVE PO LIQD
237.0000 mL | Freq: Two times a day (BID) | ORAL | Status: DC
  Administered 2016-01-31 – 2016-02-01 (×2): 237 mL via ORAL

## 2016-01-31 MED ORDER — SODIUM CHLORIDE 0.9 % IV SOLN
INTRAVENOUS | Status: DC
  Administered 2016-01-31: 03:00:00 via INTRAVENOUS

## 2016-01-31 NOTE — Progress Notes (Signed)
Utilization review completed.  

## 2016-01-31 NOTE — Progress Notes (Signed)
Initial Nutrition Assessment  DOCUMENTATION CODES:   Severe malnutrition in context of acute illness/injury  INTERVENTION:  Continue Ensure Enlive po BID, each supplement provides 350 kcal and 20 grams of protein.  Encourage adequate PO intake.  NUTRITION DIAGNOSIS:   Malnutrition related to acute illness as evidenced by percent weight loss, moderate depletions of muscle mass.  GOAL:   Patient will meet greater than or equal to 90% of their needs  MONITOR:   PO intake, Supplement acceptance, Diet advancement, Weight trends, Labs, I & O's  REASON FOR ASSESSMENT:   Malnutrition Screening Tool    ASSESSMENT:   82 y.o. female with medical history significant for hemorrhoids, GERD, hyperlipidemia, depression, and diverticulosis who was seen in this ED on 01/26/2016, diagnosed with diverticulitis and discharged home with Augmentin, now returns with worsening abdominal pain, chills, and new diarrhea.  Pt reports abdominal pains have improved. No percent meal completion recorded, however pt reports ~40% intake this AM. Pt reports having a decreased appetite which has been ongoing over the past 1 week. She reports she had been consuming 2 meals a day, however mostly liquids. Usual body weight reported to be ~145 lbs she reports last weighing last week. Pt with a 2.1% weight loss in 1 week. Pt currently has Ensure ordered and has been consuming them. RD to continue with orders.  Nutrition-Focused physical exam completed. Findings are mild fat depletion, moderate muscle depletion, and no edema.   Labs and medications reviewed.   Diet Order:  Diet full liquid Room service appropriate?: Yes; Fluid consistency:: Thin  Skin:  Reviewed, no issues  Last BM:  4/27  Height:   Ht Readings from Last 1 Encounters:  01/30/16 5\' 4"  (1.626 m)    Weight:   Wt Readings from Last 1 Encounters:  01/30/16 142 lb (64.411 kg)    Ideal Body Weight:  54.5 kg  BMI:  Body mass index is 24.36  kg/(m^2).  Estimated Nutritional Needs:   Kcal:  1600-1800  Protein:  75-85 grams  Fluid:  1.6 - 1.8 L/day  EDUCATION NEEDS:   No education needs identified at this time  Corrin Parker, MS, RD, LDN Pager # [phone] After hours/ weekend pager # [phone]

## 2016-01-31 NOTE — Progress Notes (Signed)
PROGRESS NOTE  Charlene [name] [id number] DOB: [birth year] DOA: 01/30/2016 PCP: Precious Reel, MD Outpatient Specialists:    LOS: 0 days   Brief Narrative: 82 year old female who was recently diagnosed with diverticulitis on 4/23 and started on Augmentin-presented to the ED with worsening abdominal pain. She was found to have ongoing diverticulitis on repeat CT scan of the abdomen, and admitted for IV antibiotic therapy. See below for further details  Subjective: Patient is doing well. Abdominal pain has improved, but is sore. No diarrhea, vomiting, or nausea.   Assessment & Plan: Acute diverticulitis: Improving with very little abdominal pain. Continues to have persistent but mild leukocytosis. Abdomen is benign on exam. Downgraded to full liquids, continue empiric Cipro/Flagyl. Follow with serial exams. Will require a GI follow-up on discharge to evaluate need for colonoscopy.  ? Diarrhea: Resolved, likely secondary to Augmentin. Doubt need to test for C. difficile as no stools since admission.  Hemorrhoid flare: Continue Preparation H-no hematochezia reported this morning.  Normocytic Anemia: Likely secondary to acute illness and IV fluid dilution. Follow hemoglobin periodically.   GERD: Continue Pepcid PRN   Depression:Stable-Continue current-dose Lexapro   Thrombocytosis: Likely acute phase reactant- secondary to ongoing infection. Monitor  DVT prophylaxis: Prophylactic Lovenox  Code Status: Full  Family Communication: No family at bedside Disposition Plan: Home when stable-in 2-3 days   Consultants:  None  Procedures:  None  Antimicrobials: Flagyl started 4/27 Cipro started 4/27   Objective: Filed Vitals:   01/30/16 2300 01/31/16 0030 01/31/16 0120 01/31/16 0430  BP: 148/58 127/46 136/51 110/45  Pulse: 69 66 72 62  Temp:   98.8 F (37.1 C) 98.4 F (36.9 C)  TempSrc:   Oral Oral  Resp:  16 18 18   Height:      Weight:      SpO2: 93% 95% 94% 94%     Intake/Output Summary (Last 24 hours) at 01/31/16 1102 Last data filed at 01/31/16 0600  Gross per 24 hour  Intake    425 ml  Output      0 ml  Net    425 ml   Filed Weights   01/30/16 1627  Weight: 64.411 kg (142 lb)    Examination:  Filed Vitals:   01/30/16 2300 01/31/16 0030 01/31/16 0120 01/31/16 0430  BP: 148/58 127/46 136/51 110/45  Pulse: 69 66 72 62  Temp:   98.8 F (37.1 C) 98.4 F (36.9 C)  TempSrc:   Oral Oral  Resp:  16 18 18   Height:      Weight:      SpO2: 93% 95% 94% 94%   Constitutional: NAD, calm, comfortable Eyes: PERRL Neck: normal, no JVD Respiratory: clear to auscultation bilaterally. Normal respiratory effort. No accessory muscle use.  Cardiovascular: Regular rate and rhythm. No extremity edema. 2+ pedal pulses.  Abdomen: soft, non distended. Mildly tender without guarding or rebound or rigidity-in the lower abdomen. Positive bowel sounds.  Skin: no rashes Neurologic: Nonfocal  Psychiatric: Normal judgment and insight. Alert and oriented x 3. Normal mood.    Data Reviewed: I have personally reviewed following labs and imaging studies  CBC:  Recent Labs Lab 01/26/16 1233 01/30/16 1635  WBC 15.3* 16.2*  HGB 10.6* 10.9*  HCT 34.1* 33.6*  MCV 84.2 83.4  PLT 383 123456*   Basic Metabolic Panel:  Recent Labs Lab 01/26/16 1233 01/30/16 1635  NA 138 136  K 4.7 4.8  CL 105 104  CO2 23 22  GLUCOSE 104* 104*  BUN 12 11  CREATININE 1.04* 0.99  CALCIUM 9.2 9.2   GFR: Estimated Creatinine Clearance: 35.9 mL/min (by C-G formula based on Cr of 0.99). Liver Function Tests:  Recent Labs Lab 01/26/16 1233 01/30/16 1635  AST 17 18  ALT 12* 13*  ALKPHOS 48 52  BILITOT 0.7 0.2*  PROT 6.8 7.0  ALBUMIN 2.9* 3.2*    Recent Labs Lab 01/30/16 1635  LIPASE 41   CBG:  Recent Labs Lab 01/31/16 0736  GLUCAP 89   Urine analysis:    Component Value Date/Time   COLORURINE YELLOW 01/30/2016 1945   APPEARANCEUR CLOUDY* 01/30/2016  1945   LABSPEC 1.022 01/30/2016 1945   PHURINE 6.5 01/30/2016 1945   GLUCOSEU NEGATIVE 01/30/2016 1945   HGBUR NEGATIVE 01/30/2016 1945   BILIRUBINUR NEGATIVE 01/30/2016 1945   KETONESUR 40* 01/30/2016 1945   PROTEINUR 30* 01/30/2016 1945   UROBILINOGEN 0.2 05/05/2015 1840   NITRITE NEGATIVE 01/30/2016 1945   LEUKOCYTESUR SMALL* 01/30/2016 1945    Radiology Studies: Ct Abdomen Pelvis W Contrast  01/30/2016  CLINICAL DATA:  Acute onset of worsening generalized abdominal pain, with nausea and diarrhea. Initial encounter. EXAM: CT ABDOMEN AND PELVIS WITH CONTRAST TECHNIQUE: Multidetector CT imaging of the abdomen and pelvis was performed using the standard protocol following bolus administration of intravenous contrast. CONTRAST:  100 mL ISOVUE-300 IOPAMIDOL (ISOVUE-300) INJECTION 61% COMPARISON:  CT of the abdomen and pelvis from 01/26/2016 FINDINGS: Minimal bibasilar atelectasis or scarring is seen. A small hiatal hernia is noted. Trace pericardial fluid remains within normal limits. A tiny 6 mm cyst is noted at the left hepatic lobe. The liver and spleen are otherwise unremarkable. Stones are seen dependently within the gallbladder. The gallbladder is otherwise unremarkable in appearance. The gallbladder is within normal limits. The pancreas and adrenal glands are unremarkable. Scattered bilateral renal cysts are seen, more prominent on the left. Mild nonspecific perinephric stranding is noted on the left. There is no evidence of hydronephrosis. No renal or ureteral stones are identified. The small bowel is unremarkable in appearance. The stomach is within normal limits. No acute vascular abnormalities are seen. Scattered calcification is seen along the abdominal aorta and its branches. There is minimal ectasia of the distal abdominal aorta, without evidence of aneurysmal dilatation. A small periumbilical hernia is noted, just to the right of the umbilicus, containing only fat, with minimal soft  tissue inflammation. The appendix is not definitely characterized; there is no evidence for appendicitis. Focal wall thickening is noted about the mid sigmoid colon, with associated trace free fluid and soft tissue inflammation, compatible with acute diverticulitis. Diffuse diverticulitis involves the sigmoid colon, and scattered diverticulosis is noted along the descending colon. There is no evidence of perforation or abscess formation at this time. Scattered adjacent small nodules are seen. An underlying colonic mass cannot be entirely excluded. The bladder is mildly distended and grossly unremarkable. The patient is status post hysterectomy. No suspicious adnexal masses are seen. Postoperative change is noted along the pelvic sidewall bilaterally. No inguinal lymphadenopathy is seen. No acute osseous abnormalities are identified. Facet disease is noted at the lower lumbar spine. IMPRESSION: 1. Acute diverticulitis at the mid sigmoid colon, with focal wall thickening, soft tissue inflammation and trace free fluid. No evidence of perforation or abscess formation at this time. 2. Scattered adjacent nodules noted. Underlying mass cannot be entirely excluded. Would recommend sigmoidoscopy after completion of treatment for diverticulitis, if deemed clinically appropriate. 3. Diffuse diverticulosis of the sigmoid colon, and scattered diverticulosis along  the descending colon. 4. Small hiatal hernia noted. 5. Small periumbilical hernia, just to the right of the umbilicus, containing only fat, with minimal soft tissue inflammation. 6. Tiny hepatic cyst noted. 7. Scattered bilateral renal cysts seen, more prominent on the left. Electronically Signed   By: Garald Balding M.D.   On: 01/30/2016 23:44     Scheduled Meds: . ciprofloxacin  400 mg Intravenous BID  . enoxaparin (LOVENOX) injection  40 mg Subcutaneous Daily  . escitalopram  5 mg Oral QHS  . feeding supplement (ENSURE ENLIVE)  237 mL Oral BID BM  .  metronidazole  500 mg Intravenous Q8H  . multivitamin with minerals  1 tablet Oral Daily   Continuous Infusions: . sodium chloride 75 mL/hr at 01/31/16 0936    Time spent: 25 minutes  Oren Binet, MD [facility] Pager [phone]  If 7PM-7AM, please contact night-coverage [email or web address] Password [email or web address] 01/31/2016, 11:02 AM

## 2016-01-31 NOTE — H&P (Signed)
History and Physical    Charlene [name] [id number] DOB: [birth year] DOA: 01/30/2016  Referring Provider: EDP PCP: Precious Reel, MD  Outpatient Specialists: Dr. Henrene Pastor (GI)   Patient coming from: Home   Chief Complaint: Lower abd pain, chills, diarrhea  HPI: Charlene [name] is a 82 y.o. female with medical history significant for hemorrhoids, GERD, hyperlipidemia, depression, and diverticulosis who was seen in this ED on 01/26/2016, diagnosed with diverticulitis and discharged home with Augmentin, now returns with worsening abdominal pain, chills, and new diarrhea. Patient had initially presented on April 23 and CT abdomen was obtained and demonstrative of acute sigmoid diverticulitis. There was no apparent perforation or abscess formation, and while the patient was offered admission to the hospital, she elected to return home for treatment with Augmentin. She reports some initial improvement back at home, but By 01/29/2016, she was experiencing increased lower abdominal pain and profuse watery diarrhea which has persisted through today. Patient describes her abdominal pain as crampy, constant, and localized to the lower abdomen. She denies melena or hematochezia but endorses watery diarrhea, several episodes daily. She denies fevers per se, but endorses chills. She denies any history of C diff colitis, but a family member had it remotely.   ED Course: Upon arrival to the ED, patient is found to be afebrile, saturating well on room air, and with vital signs stable. CMP is largely unremarkable and CBC features a leukocytosis of 16,200, increased from recent visit. Hemoglobin is 10.9, consistent with prior measurements. Platelet count is 460,000, newly elevated relative to recent CBC. Urine was obtained for analysis and grossly negative for infection. CT of the abdomen and pelvis was obtained and again demonstrates acute diverticulitis at the mid-sigmoid with focal wall thickening and soft tissue  inflammation. There is no perforation or abscess. Incidental notation is made of scattered nodules throughout the sigmoid colon with malignancy unable to be excluded. A 1 L normal saline bolus was administered. Morphine and fentanyl were given IV for pain. Patient was treated empirically with IV ciprofloxacin and Flagyl. She remained hemodynamically stable in the ED and will be admitted to the hospital for ongoing evaluation and management of acute diverticulitis which has failed outpatient therapy.  Review of Systems:  All other systems reviewed and apart from HPI, are negative.  Past Medical History  Diagnosis Date  . Macular degeneration   . GERD (gastroesophageal reflux disease)   . Allergy     seasonal  . BPPV (benign paroxysmal positional vertigo) 2012  . Depression   . Urine frequency   . Dyslipidemia   . Diverticulitis   . Acute renal failure ([place])   . Hemorrhoids   . Uterine carcinoma [facility])     Past Surgical History  Procedure Laterality Date  . Abdominal hysterectomy    . Eye surgery      cataract removal bilateral 2012  . Tonsillectomy       reports that she quit smoking about 43 years ago. She has never used smokeless tobacco. She reports that she drinks alcohol. She reports that she does not use illicit drugs.  No Known Allergies  Family History  Problem Relation Age of Onset  . Heart disease Mother   . Stroke Father   . Cancer Brother     lung cancer  . Heart disease Brother   . Diabetes Mother   . Diabetes Father      Prior to Admission medications   Medication Sig Start Date End Date Taking? Authorizing Provider  escitalopram (LEXAPRO) 5 MG tablet takes 5mg  by mouth once daily at bedtime 01/22/16  Yes Historical Provider, MD  famotidine (PEPCID AC) 10 MG chewable tablet Chew 10 mg by mouth 2 (two) times daily as needed for heartburn.   Yes Historical Provider, MD  HYDROcodone-acetaminophen (NORCO) 5-325 MG tablet Take 0.5-1 tablets by mouth every 6  (six) hours as needed for severe pain. 01/26/16  Yes Sherwood Gambler, MD  Multiple Vitamins-Minerals (MULTIVITAMIN ADULT PO) Take 1 tablet by mouth daily.    Yes Historical Provider, MD  Multiple Vitamins-Minerals (PRESERVISION AREDS PO) Take 1 tablet by mouth 2 (two) times daily.   Yes Historical Provider, MD  OVER THE COUNTER MEDICATION Take 2 tablets by mouth daily. "Slippery elm"   Yes Historical Provider, MD  triamcinolone (NASACORT AQ) 55 MCG/ACT AERO nasal inhaler Place 2 sprays into the nose daily as needed (for allergies).   Yes Historical Provider, MD  amoxicillin-clavulanate (AUGMENTIN) 875-125 MG tablet Take 1 tablet by mouth 2 (two) times daily. One po bid x 7 days 01/26/16   Sherwood Gambler, MD    Physical Exam: Filed Vitals:   01/30/16 2230 01/30/16 2300 01/31/16 0030 01/31/16 0120  BP: 151/56 148/58 127/46 136/51  Pulse: 65 69 66 72  Temp:    98.8 F (37.1 C)  TempSrc:    Oral  Resp:   16 18  Height:      Weight:      SpO2: 96% 93% 95% 94%      Constitutional: NAD, calm, comfortable Eyes: PERTLA, lids and conjunctivae normal ENMT: Mucous membranes are moist. Posterior pharynx clear of any exudate or lesions.   Neck: normal, supple, no masses, no thyromegaly Respiratory: clear to auscultation bilaterally, no wheezing, no crackles. Normal respiratory effort. No accessory muscle use.  Cardiovascular: S1 & S2 heard, regular rate and rhythm, no murmurs / rubs / gallops. No extremity edema. 2+ pedal pulses.    Abdomen: No distension, mildly tender across lower quadrants, no masses palpated. No hepatosplenomegaly. Bowel sounds normal.  Musculoskeletal: no clubbing / cyanosis. No joint deformity upper and lower extremities. Normal muscle tone.  Skin: no rashes, lesions, ulcers. No induration Neurologic: CN 2-12 grossly intact. Sensation intact, DTR normal. Strength 5/5 in all 4 limbs.  Psychiatric: Normal judgment and insight. Alert and oriented x 3. Normal mood.     Labs on  Admission: I have personally reviewed following labs and imaging studies  CBC:  Recent Labs Lab 01/26/16 1233 01/30/16 1635  WBC 15.3* 16.2*  HGB 10.6* 10.9*  HCT 34.1* 33.6*  MCV 84.2 83.4  PLT 383 123456*   Basic Metabolic Panel:  Recent Labs Lab 01/26/16 1233 01/30/16 1635  NA 138 136  K 4.7 4.8  CL 105 104  CO2 23 22  GLUCOSE 104* 104*  BUN 12 11  CREATININE 1.04* 0.99  CALCIUM 9.2 9.2   GFR: Estimated Creatinine Clearance: 35.9 mL/min (by C-G formula based on Cr of 0.99). Liver Function Tests:  Recent Labs Lab 01/26/16 1233 01/30/16 1635  AST 17 18  ALT 12* 13*  ALKPHOS 48 52  BILITOT 0.7 0.2*  PROT 6.8 7.0  ALBUMIN 2.9* 3.2*    Recent Labs Lab 01/30/16 1635  LIPASE 41   No results for input(s): AMMONIA in the last 168 hours. Coagulation Profile: No results for input(s): INR, PROTIME in the last 168 hours. Cardiac Enzymes: No results for input(s): CKTOTAL, CKMB, CKMBINDEX, TROPONINI in the last 168 hours. BNP (last 3 results) No results for  input(s): PROBNP in the last 8760 hours. HbA1C: No results for input(s): HGBA1C in the last 72 hours. CBG: No results for input(s): GLUCAP in the last 168 hours. Lipid Profile: No results for input(s): CHOL, HDL, LDLCALC, TRIG, CHOLHDL, LDLDIRECT in the last 72 hours. Thyroid Function Tests: No results for input(s): TSH, T4TOTAL, FREET4, T3FREE, THYROIDAB in the last 72 hours. Anemia Panel: No results for input(s): VITAMINB12, FOLATE, FERRITIN, TIBC, IRON, RETICCTPCT in the last 72 hours. Urine analysis:    Component Value Date/Time   COLORURINE YELLOW 01/30/2016 1945   APPEARANCEUR CLOUDY* 01/30/2016 1945   LABSPEC 1.022 01/30/2016 1945   PHURINE 6.5 01/30/2016 1945   GLUCOSEU NEGATIVE 01/30/2016 1945   HGBUR NEGATIVE 01/30/2016 1945   BILIRUBINUR NEGATIVE 01/30/2016 1945   KETONESUR 40* 01/30/2016 1945   PROTEINUR 30* 01/30/2016 1945   UROBILINOGEN 0.2 05/05/2015 1840   NITRITE NEGATIVE 01/30/2016  1945   LEUKOCYTESUR SMALL* 01/30/2016 1945   Sepsis Labs: @LABRCNTIP (procalcitonin:4,lacticidven:4) )No results found for this or any previous visit (from the past 240 hour(s)).   Radiological Exams on Admission: Ct Abdomen Pelvis W Contrast  01/30/2016  CLINICAL DATA:  Acute onset of worsening generalized abdominal pain, with nausea and diarrhea. Initial encounter. EXAM: CT ABDOMEN AND PELVIS WITH CONTRAST TECHNIQUE: Multidetector CT imaging of the abdomen and pelvis was performed using the standard protocol following bolus administration of intravenous contrast. CONTRAST:  100 mL ISOVUE-300 IOPAMIDOL (ISOVUE-300) INJECTION 61% COMPARISON:  CT of the abdomen and pelvis from 01/26/2016 FINDINGS: Minimal bibasilar atelectasis or scarring is seen. A small hiatal hernia is noted. Trace pericardial fluid remains within normal limits. A tiny 6 mm cyst is noted at the left hepatic lobe. The liver and spleen are otherwise unremarkable. Stones are seen dependently within the gallbladder. The gallbladder is otherwise unremarkable in appearance. The gallbladder is within normal limits. The pancreas and adrenal glands are unremarkable. Scattered bilateral renal cysts are seen, more prominent on the left. Mild nonspecific perinephric stranding is noted on the left. There is no evidence of hydronephrosis. No renal or ureteral stones are identified. The small bowel is unremarkable in appearance. The stomach is within normal limits. No acute vascular abnormalities are seen. Scattered calcification is seen along the abdominal aorta and its branches. There is minimal ectasia of the distal abdominal aorta, without evidence of aneurysmal dilatation. A small periumbilical hernia is noted, just to the right of the umbilicus, containing only fat, with minimal soft tissue inflammation. The appendix is not definitely characterized; there is no evidence for appendicitis. Focal wall thickening is noted about the mid sigmoid colon,  with associated trace free fluid and soft tissue inflammation, compatible with acute diverticulitis. Diffuse diverticulitis involves the sigmoid colon, and scattered diverticulosis is noted along the descending colon. There is no evidence of perforation or abscess formation at this time. Scattered adjacent small nodules are seen. An underlying colonic mass cannot be entirely excluded. The bladder is mildly distended and grossly unremarkable. The patient is status post hysterectomy. No suspicious adnexal masses are seen. Postoperative change is noted along the pelvic sidewall bilaterally. No inguinal lymphadenopathy is seen. No acute osseous abnormalities are identified. Facet disease is noted at the lower lumbar spine. IMPRESSION: 1. Acute diverticulitis at the mid sigmoid colon, with focal wall thickening, soft tissue inflammation and trace free fluid. No evidence of perforation or abscess formation at this time. 2. Scattered adjacent nodules noted. Underlying mass cannot be entirely excluded. Would recommend sigmoidoscopy after completion of treatment for diverticulitis, if deemed  clinically appropriate. 3. Diffuse diverticulosis of the sigmoid colon, and scattered diverticulosis along the descending colon. 4. Small hiatal hernia noted. 5. Small periumbilical hernia, just to the right of the umbilicus, containing only fat, with minimal soft tissue inflammation. 6. Tiny hepatic cyst noted. 7. Scattered bilateral renal cysts seen, more prominent on the left. Electronically Signed   By: Garald Balding M.D.   On: 01/30/2016 23:44    EKG: None available, will order as appropriate.   Assessment/Plan  1. Acute diverticulitis  - Identified on CT with no associated abscess or perforation identified  - Pt is not septic, but given age and failure of outpt therapy, admission was advised  - Empiric treatment with IV Cipro and Flagyl initiated in ED, will continue with these agents  - Trend wbc, procalcitonin,  clinical progress  - Advance diet as tolerated   - Pain-control prn   2. Diarrhea  - Pt reports watery diarrhea for past 2-3 days on Augmentin for diverticulitis   - Concern for antibiotic-associated diarrhea, particularly C diff  - Check C diff PCR  - Infection-control with enteric precautions  - Gentle IVF hydration   3. Hemorrhoid flare - Pt uses Preparation H with good results at home, will continue this here    4. Anemia  - Hgb 10.9 on admission, stable relative to priors  - No sign of active blood loss  - Monitoring   5. GERD - Stable  - Using Pepcid prn at home, will continue that here   6. Depression  - Appears to be stable  - Continue current-dose Lexapro    DVT prophylaxis: sq Lovenox  Code Status: Full  Family Communication: None available Disposition Plan: Admit to med-surg  Consults called: None  Admission status: Inpatient    Vianne Bulls MD [facility] Pager [phone]  If 7PM-7AM, please contact night-coverage [email or web address] Password [email or web address]  01/31/2016, 2:09 AM

## 2016-01-31 NOTE — ED Provider Notes (Signed)
CSN: [id number]     Arrival date & time 01/30/16  1623 History   First MD Initiated Contact with Patient 01/30/16 2019     Chief Complaint  Patient presents with  . Abdominal Pain     (Consider location/radiation/quality/duration/timing/severity/associated sxs/prior Treatment) HPI 82 year old patient presents complaining of abdominal pain. She was recently diagnosed with diverticulitis. She was started on by mouth antibiotics and offered admission to the hospital, but declined, opting to try to treat as an outpatient. Over the past 3 days, since her diagnosis, she has had persistent abdominal pain that she feels has been worsening. She has developed diarrhea, but has not had any blood in her stools. She has been vomiting, and having difficulty keeping down her medicines. She says the pain in her abdomen is worse. She says that it is currently a 7 out of 10 in severity. She says it is located in the right lower quadrant, denies any radiation.  Past Medical History  Diagnosis Date  . Macular degeneration   . GERD (gastroesophageal reflux disease)   . Allergy     seasonal  . BPPV (benign paroxysmal positional vertigo) 2012  . Depression   . Urine frequency   . Dyslipidemia   . Diverticulitis   . Acute renal failure ([place])   . Hemorrhoids   . Uterine carcinoma [facility])    Past Surgical History  Procedure Laterality Date  . Abdominal hysterectomy    . Eye surgery      cataract removal bilateral 2012  . Tonsillectomy     Family History  Problem Relation Age of Onset  . Heart disease Mother   . Stroke Father   . Cancer Brother     lung cancer  . Heart disease Brother   . Diabetes Mother   . Diabetes Father    Social History  Substance Use Topics  . Smoking status: Former Smoker    Quit date: 01/19/1973  . Smokeless tobacco: Never Used  . Alcohol Use: Yes     Comment: occ   OB History    No data available     Review of Systems  Constitutional: Positive for fever.   Respiratory: Negative for shortness of breath.   Gastrointestinal: Positive for abdominal pain and diarrhea. Negative for blood in stool.  All other systems reviewed and are negative.     Allergies  Review of patient's allergies indicates no known allergies.  Home Medications   Prior to Admission medications   Medication Sig Start Date End Date Taking? Authorizing Provider  escitalopram (LEXAPRO) 5 MG tablet takes 5mg  by mouth once daily at bedtime 01/22/16  Yes Historical Provider, MD  famotidine (PEPCID AC) 10 MG chewable tablet Chew 10 mg by mouth 2 (two) times daily as needed for heartburn.   Yes Historical Provider, MD  HYDROcodone-acetaminophen (NORCO) 5-325 MG tablet Take 0.5-1 tablets by mouth every 6 (six) hours as needed for severe pain. 01/26/16  Yes Sherwood Gambler, MD  Multiple Vitamins-Minerals (MULTIVITAMIN ADULT PO) Take 1 tablet by mouth daily.    Yes Historical Provider, MD  Multiple Vitamins-Minerals (PRESERVISION AREDS PO) Take 1 tablet by mouth 2 (two) times daily.   Yes Historical Provider, MD  OVER THE COUNTER MEDICATION Take 2 tablets by mouth daily. "Slippery elm"   Yes Historical Provider, MD  triamcinolone (NASACORT AQ) 55 MCG/ACT AERO nasal inhaler Place 2 sprays into the nose daily as needed (for allergies).   Yes Historical Provider, MD  amoxicillin-clavulanate (AUGMENTIN) 875-125 MG tablet Take 1  tablet by mouth 2 (two) times daily. One po bid x 7 days 01/26/16   Sherwood Gambler, MD   BP 148/58 mmHg  Pulse 69  Temp(Src) 97.5 F (36.4 C) (Oral)  Resp 18  Ht 5\' 4"  (1.626 m)  Wt 64.411 kg  BMI 24.36 kg/m2  SpO2 93% Physical Exam  Constitutional: She is oriented to person, place, and time.  HENT:  Head: Normocephalic and atraumatic.  Eyes: Pupils are equal, round, and reactive to light.  Neck: No JVD present.  Cardiovascular: Normal rate and regular rhythm.   Pulmonary/Chest: Effort normal.  Abdominal: Soft. There is tenderness (LLQ). There is no  rebound.  Musculoskeletal: Normal range of motion.  Neurological: She is alert and oriented to person, place, and time.  Skin: Skin is warm and dry.  Psychiatric: She has a normal mood and affect.  Nursing note and vitals reviewed.   ED Course  Procedures (including critical care time) Labs Review Labs Reviewed  COMPREHENSIVE METABOLIC PANEL - Abnormal; Notable for the following:    Glucose, Bld 104 (*)    Albumin 3.2 (*)    ALT 13 (*)    Total Bilirubin 0.2 (*)    GFR calc non Af Amer 51 (*)    GFR calc Af Amer 59 (*)    All other components within normal limits  CBC - Abnormal; Notable for the following:    WBC 16.2 (*)    Hemoglobin 10.9 (*)    HCT 33.6 (*)    Platelets 460 (*)    All other components within normal limits  URINALYSIS, ROUTINE W REFLEX MICROSCOPIC (NOT AT [facility]) - Abnormal; Notable for the following:    APPearance CLOUDY (*)    Ketones, ur 40 (*)    Protein, ur 30 (*)    Leukocytes, UA SMALL (*)    All other components within normal limits  URINE MICROSCOPIC-ADD ON - Abnormal; Notable for the following:    Squamous Epithelial / LPF 0-5 (*)    Bacteria, UA MANY (*)    All other components within normal limits  LIPASE, BLOOD    Imaging Review Ct Abdomen Pelvis W Contrast  01/30/2016  CLINICAL DATA:  Acute onset of worsening generalized abdominal pain, with nausea and diarrhea. Initial encounter. EXAM: CT ABDOMEN AND PELVIS WITH CONTRAST TECHNIQUE: Multidetector CT imaging of the abdomen and pelvis was performed using the standard protocol following bolus administration of intravenous contrast. CONTRAST:  100 mL ISOVUE-300 IOPAMIDOL (ISOVUE-300) INJECTION 61% COMPARISON:  CT of the abdomen and pelvis from 01/26/2016 FINDINGS: Minimal bibasilar atelectasis or scarring is seen. A small hiatal hernia is noted. Trace pericardial fluid remains within normal limits. A tiny 6 mm cyst is noted at the left hepatic lobe. The liver and spleen are otherwise unremarkable.  Stones are seen dependently within the gallbladder. The gallbladder is otherwise unremarkable in appearance. The gallbladder is within normal limits. The pancreas and adrenal glands are unremarkable. Scattered bilateral renal cysts are seen, more prominent on the left. Mild nonspecific perinephric stranding is noted on the left. There is no evidence of hydronephrosis. No renal or ureteral stones are identified. The small bowel is unremarkable in appearance. The stomach is within normal limits. No acute vascular abnormalities are seen. Scattered calcification is seen along the abdominal aorta and its branches. There is minimal ectasia of the distal abdominal aorta, without evidence of aneurysmal dilatation. A small periumbilical hernia is noted, just to the right of the umbilicus, containing only fat, with minimal soft  tissue inflammation. The appendix is not definitely characterized; there is no evidence for appendicitis. Focal wall thickening is noted about the mid sigmoid colon, with associated trace free fluid and soft tissue inflammation, compatible with acute diverticulitis. Diffuse diverticulitis involves the sigmoid colon, and scattered diverticulosis is noted along the descending colon. There is no evidence of perforation or abscess formation at this time. Scattered adjacent small nodules are seen. An underlying colonic mass cannot be entirely excluded. The bladder is mildly distended and grossly unremarkable. The patient is status post hysterectomy. No suspicious adnexal masses are seen. Postoperative change is noted along the pelvic sidewall bilaterally. No inguinal lymphadenopathy is seen. No acute osseous abnormalities are identified. Facet disease is noted at the lower lumbar spine. IMPRESSION: 1. Acute diverticulitis at the mid sigmoid colon, with focal wall thickening, soft tissue inflammation and trace free fluid. No evidence of perforation or abscess formation at this time. 2. Scattered adjacent  nodules noted. Underlying mass cannot be entirely excluded. Would recommend sigmoidoscopy after completion of treatment for diverticulitis, if deemed clinically appropriate. 3. Diffuse diverticulosis of the sigmoid colon, and scattered diverticulosis along the descending colon. 4. Small hiatal hernia noted. 5. Small periumbilical hernia, just to the right of the umbilicus, containing only fat, with minimal soft tissue inflammation. 6. Tiny hepatic cyst noted. 7. Scattered bilateral renal cysts seen, more prominent on the left. Electronically Signed   By: Garald Balding M.D.   On: 01/30/2016 23:44   I have personally reviewed and evaluated these images and lab results as part of my medical decision-making.   EKG Interpretation None      MDM   Final diagnoses:  Acute diverticulitis    Patient presents complaining of abdominal pain, recently diagnosed with diverticulitis. CT scan was obtained to rule out perforation, abscess formation, or worsening of her diverticulitis. Her white blood cell count has increased slightly, and she has severe pain. IVP medications were given with control of her pain. CT obtained and shows worsened diverticulitis, but no evidence of abscess or perforation. She will be given IV antibiotics and admitted for failure of outpatient treatment.    Leata Mouse, MD 02/01/16 Leslie, MD 02/03/16 [phone]

## 2016-02-01 DIAGNOSIS — E43 Unspecified severe protein-calorie malnutrition: Secondary | ICD-10-CM | POA: Insufficient documentation

## 2016-02-01 LAB — CBC
HCT: 30.5 % — ABNORMAL LOW (ref 36.0–46.0)
Hemoglobin: 10 g/dL — ABNORMAL LOW (ref 12.0–15.0)
MCH: 28.2 pg (ref 26.0–34.0)
MCHC: 32.8 g/dL (ref 30.0–36.0)
MCV: 85.9 fL (ref 78.0–100.0)
PLATELETS: 442 10*3/uL — AB (ref 150–400)
RBC: 3.55 MIL/uL — AB (ref 3.87–5.11)
RDW: 13.9 % (ref 11.5–15.5)
WBC: 17 10*3/uL — ABNORMAL HIGH (ref 4.0–10.5)

## 2016-02-01 LAB — DIFFERENTIAL
BASOS PCT: 0 %
Basophils Absolute: 0 10*3/uL (ref 0.0–0.1)
Eosinophils Absolute: 0.2 10*3/uL (ref 0.0–0.7)
Eosinophils Relative: 1 %
Lymphocytes Relative: 9 %
Lymphs Abs: 1.5 10*3/uL (ref 0.7–4.0)
MONO ABS: 1.4 10*3/uL — AB (ref 0.1–1.0)
MONOS PCT: 8 %
NEUTROS ABS: 13.6 10*3/uL — AB (ref 1.7–7.7)
Neutrophils Relative %: 82 %

## 2016-02-01 LAB — GLUCOSE, CAPILLARY: Glucose-Capillary: 97 mg/dL (ref 65–99)

## 2016-02-01 MED ORDER — SODIUM CHLORIDE 0.9 % IV SOLN
INTRAVENOUS | Status: AC
  Administered 2016-02-01: 50 mL/h via INTRAVENOUS

## 2016-02-01 NOTE — Progress Notes (Signed)
PROGRESS NOTE    Charlene [name]  [id number] DOB: May 02, 1928 DOA: 01/30/2016 PCP: Precious Reel, MD  Outpatient Specialists:     Brief Narrative:  82 year old female who was recently diagnosed with diverticulitis on 4/23 and started on Augmentin-presented to the ED with worsening abdominal pain. She was found to have ongoing diverticulitis on repeat CT scan of the abdomen, and admitted for IV antibiotic therapy. See below for further details   Assessment & Plan:   Acute diverticulitis - Symptomatically improving and requesting to try solid foods today, diet will be increased to dys 3 which is softer.  If she tolerates, can move to a heart healthy diet for dinner - Abdominal pain is improved - IV decreased to 50cc/hr  - Continue IV cipro/flagyl until she eats, if she tolerates, would change to PO antibiotics - though improving, her WBC continues to go up.  Would consider repeat imaging if she worsens or this does not start to trend down on current therapy  SOB - Crackles at bases of lungs, likely related to IVF - Decrease IVF to 50cc/hr for 8 hours then stop if she is eating - O2 for comfort today - Encouraged IS - Walk with oxygen this afternoon or tomorrow morning to ensure she does not desaturate  Depression - Stable, continue lexapro    GERD (gastroesophageal reflux disease) - Pepcid PRN    Hemorrhoids - Preparation h, no further symptoms reported today  Thrombocytosis - Acute phase reactant, also increasing today.  Monitor  Malnutrition - Diet changed today, ensure between meals.    DVT prophylaxis: Lovenox  Code Status: Full   Disposition Plan: Anticipate discharge tomorrow if she does well with diet and oral meds   Consultants:   none  Procedures:   none  Antimicrobials:   Cipro/Flagyl    Subjective: Pain improved today.  Needing O2 now.  Interested in taking a shower and solids for food.   Objective: Filed Vitals:   01/31/16 1240 01/31/16  1245 01/31/16 2001 02/01/16 0451  BP: 117/50  100/34 120/39  Pulse: 64  69 62  Temp: 98.8 F (37.1 C)  100.3 F (37.9 C) 97.8 F (36.6 C)  TempSrc: Oral  Oral Oral  Resp:   18 18  Height:      Weight:      SpO2: 87% 92% 94% 91%   No intake or output data in the 24 hours ending 02/01/16 0946 Filed Weights   01/30/16 1627  Weight: 142 lb (64.411 kg)    Examination:  General exam: Appears calm and comfortable, elderly woman, [place] in place  Respiratory system: Crackles at bases. Respiratory effort normal. Cardiovascular system: S1 & S2 heard, RR, NR. NO pedal edema Gastrointestinal system: Abdomen is nondistended, soft and nontender. Normal bowel sounds heard. Central nervous system: Alert and oriented. No focal neurological deficits. Skin: No rashes, lesions or ulcers Psychiatry: Judgement and insight appear normal. Mood & affect appropriate.     Data Reviewed: I have personally reviewed following labs and imaging studies  CBC:  Recent Labs Lab 01/26/16 1233 01/30/16 1635 02/01/16 0351  WBC 15.3* 16.2* 17.0*  HGB 10.6* 10.9* 10.0*  HCT 34.1* 33.6* 30.5*  MCV 84.2 83.4 85.9  PLT 383 460* [id number]*   Basic Metabolic Panel:  Recent Labs Lab 01/26/16 1233 01/30/16 1635  NA 138 136  K 4.7 4.8  CL 105 104  CO2 23 22  GLUCOSE 104* 104*  BUN 12 11  CREATININE 1.04* 0.99  CALCIUM  9.2 9.2   GFR: Estimated Creatinine Clearance: 35.9 mL/min (by C-G formula based on Cr of 0.99). Liver Function Tests:  Recent Labs Lab 01/26/16 1233 01/30/16 1635  AST 17 18  ALT 12* 13*  ALKPHOS 48 52  BILITOT 0.7 0.2*  PROT 6.8 7.0  ALBUMIN 2.9* 3.2*    Recent Labs Lab 01/30/16 1635  LIPASE 41   CBG:  Recent Labs Lab 01/31/16 0736 02/01/16 0623  GLUCAP 89 97  Urine analysis:    Component Value Date/Time   COLORURINE YELLOW 01/30/2016 1945   APPEARANCEUR CLOUDY* 01/30/2016 1945   LABSPEC 1.022 01/30/2016 1945   PHURINE 6.5 01/30/2016 1945   GLUCOSEU NEGATIVE  01/30/2016 1945   HGBUR NEGATIVE 01/30/2016 1945   BILIRUBINUR NEGATIVE 01/30/2016 1945   KETONESUR 40* 01/30/2016 1945   PROTEINUR 30* 01/30/2016 1945   UROBILINOGEN 0.2 05/05/2015 1840   NITRITE NEGATIVE 01/30/2016 1945   LEUKOCYTESUR SMALL* 01/30/2016 1945        Radiology Studies: Ct Abdomen Pelvis W Contrast  01/30/2016  CLINICAL DATA:  Acute onset of worsening generalized abdominal pain, with nausea and diarrhea. Initial encounter. EXAM: CT ABDOMEN AND PELVIS WITH CONTRAST TECHNIQUE: Multidetector CT imaging of the abdomen and pelvis was performed using the standard protocol following bolus administration of intravenous contrast. CONTRAST:  100 mL ISOVUE-300 IOPAMIDOL (ISOVUE-300) INJECTION 61% COMPARISON:  CT of the abdomen and pelvis from 01/26/2016 FINDINGS: Minimal bibasilar atelectasis or scarring is seen. A small hiatal hernia is noted. Trace pericardial fluid remains within normal limits. A tiny 6 mm cyst is noted at the left hepatic lobe. The liver and spleen are otherwise unremarkable. Stones are seen dependently within the gallbladder. The gallbladder is otherwise unremarkable in appearance. The gallbladder is within normal limits. The pancreas and adrenal glands are unremarkable. Scattered bilateral renal cysts are seen, more prominent on the left. Mild nonspecific perinephric stranding is noted on the left. There is no evidence of hydronephrosis. No renal or ureteral stones are identified. The small bowel is unremarkable in appearance. The stomach is within normal limits. No acute vascular abnormalities are seen. Scattered calcification is seen along the abdominal aorta and its branches. There is minimal ectasia of the distal abdominal aorta, without evidence of aneurysmal dilatation. A small periumbilical hernia is noted, just to the right of the umbilicus, containing only fat, with minimal soft tissue inflammation. The appendix is not definitely characterized; there is no evidence  for appendicitis. Focal wall thickening is noted about the mid sigmoid colon, with associated trace free fluid and soft tissue inflammation, compatible with acute diverticulitis. Diffuse diverticulitis involves the sigmoid colon, and scattered diverticulosis is noted along the descending colon. There is no evidence of perforation or abscess formation at this time. Scattered adjacent small nodules are seen. An underlying colonic mass cannot be entirely excluded. The bladder is mildly distended and grossly unremarkable. The patient is status post hysterectomy. No suspicious adnexal masses are seen. Postoperative change is noted along the pelvic sidewall bilaterally. No inguinal lymphadenopathy is seen. No acute osseous abnormalities are identified. Facet disease is noted at the lower lumbar spine. IMPRESSION: 1. Acute diverticulitis at the mid sigmoid colon, with focal wall thickening, soft tissue inflammation and trace free fluid. No evidence of perforation or abscess formation at this time. 2. Scattered adjacent nodules noted. Underlying mass cannot be entirely excluded. Would recommend sigmoidoscopy after completion of treatment for diverticulitis, if deemed clinically appropriate. 3. Diffuse diverticulosis of the sigmoid colon, and scattered diverticulosis along the descending colon.  4. Small hiatal hernia noted. 5. Small periumbilical hernia, just to the right of the umbilicus, containing only fat, with minimal soft tissue inflammation. 6. Tiny hepatic cyst noted. 7. Scattered bilateral renal cysts seen, more prominent on the left. Electronically Signed   By: Garald Balding M.D.   On: 01/30/2016 23:44        Scheduled Meds: . ciprofloxacin  400 mg Intravenous BID  . enoxaparin (LOVENOX) injection  40 mg Subcutaneous Daily  . escitalopram  5 mg Oral QHS  . feeding supplement (ENSURE ENLIVE)  237 mL Oral BID BM  . metronidazole  500 mg Intravenous Q8H  . multivitamin with minerals  1 tablet Oral Daily     Continuous Infusions:    LOS: 1 day    Time spent: 25 minutes    Gilles Chiquito, MD [facility] Pager [phone]  If 7PM-7AM, please contact night-coverage [email or web address] Password [facility] 02/01/2016, 9:46 AM

## 2016-02-02 LAB — GLUCOSE, CAPILLARY: Glucose-Capillary: 90 mg/dL (ref 65–99)

## 2016-02-02 LAB — PROCALCITONIN: PROCALCITONIN: 0.14 ng/mL

## 2016-02-02 MED ORDER — ONDANSETRON HCL 4 MG PO TABS: 4.0000 mg | ORAL_TABLET | Freq: Three times a day (TID) | ORAL | Status: DC | PRN

## 2016-02-02 MED ORDER — CIPROFLOXACIN HCL 500 MG PO TABS
500.0000 mg | ORAL_TABLET | Freq: Two times a day (BID) | ORAL | Status: DC
  Administered 2016-02-02: 500 mg via ORAL
  Filled 2016-02-02: qty 1

## 2016-02-02 MED ORDER — ENSURE ENLIVE PO LIQD: 237.0000 mL | Freq: Two times a day (BID) | ORAL | Status: DC

## 2016-02-02 MED ORDER — METRONIDAZOLE 500 MG PO TABS: 500.0000 mg | ORAL_TABLET | Freq: Three times a day (TID) | ORAL | Status: DC

## 2016-02-02 MED ORDER — CIPROFLOXACIN HCL 500 MG PO TABS: 500.0000 mg | ORAL_TABLET | Freq: Two times a day (BID) | ORAL | Status: DC

## 2016-02-02 MED ORDER — ONDANSETRON HCL 4 MG PO TABS
4.0000 mg | ORAL_TABLET | Freq: Three times a day (TID) | ORAL | Status: DC | PRN
  Administered 2016-02-02: 4 mg via ORAL
  Filled 2016-02-02: qty 1

## 2016-02-02 NOTE — Progress Notes (Signed)
PROGRESS NOTE    Charlene [name]  [id number] DOB: 03/05/1928 DOA: 01/30/2016 PCP: Precious Reel, MD  Outpatient Specialists:     Brief Narrative:  82 year old female who was recently diagnosed with diverticulitis on 4/23 and started on Augmentin-presented to the ED with worsening abdominal pain. She was found to have ongoing diverticulitis on repeat CT scan of the abdomen, and admitted for IV antibiotic therapy. See below for further details  Assessment & Plan:   Acute diverticulitis - Did well with solid food - Abdominal pain is improved, still with some nausea - Change to PO Cipro/Flagyl today.  Day 3 of 10 for her antibiotics.  - Check CBC on discharge with her PCP.  She is clinically much better today.   SOB - Breathing improved with stopping fluids.  No longer requiring O2 - Encouraged IS  Depression - Stable, continue lexapro    GERD (gastroesophageal reflux disease) - Pepcid PRN    Hemorrhoids - Preparation h, no further symptoms reported today  Thrombocytosis - Acute phase reactant noted, check as an outpatient  Malnutrition - Discharge with recommendation to take ensure between meals.    DVT prophylaxis: Lovenox  Code Status: Full   Disposition Plan: Discharge today   Consultants:   none  Procedures:   none  Antimicrobials:   Cipro/Flagyl    Subjective: Pain improved, no longer requiring oxygen.  Has some nausea with metronidazole dose.  She thinks that she can go home and take her antibiotics at home.  She has tolerated 3 meals with solid food without issue.  She is interested in changing her diet so this doesn't recur.   Objective: Filed Vitals:   01/31/16 2001 02/01/16 0451 02/01/16 2045 02/02/16 0537  BP: 100/34 120/39 125/35 115/50  Pulse: 69 62 66 53  Temp: 100.3 F (37.9 C) 97.8 F (36.6 C) 98 F (36.7 C) 98.5 F (36.9 C)  TempSrc: Oral Oral Oral Oral  Resp: 18 18 18 18   Height:      Weight:      SpO2: 94% 91% 95% 95%     Intake/Output Summary (Last 24 hours) at 02/02/16 1036 Last data filed at 02/01/16 1900  Gross per 24 hour  Intake    480 ml  Output      0 ml  Net    480 ml   Filed Weights   01/30/16 1627  Weight: 142 lb (64.411 kg)    Examination:  General exam: Appears calm and comfortable, elderly woman Respiratory system: Respiratory effort normal. Cardiovascular system: S1 & S2 heard, RR, NR. No pedal edema Gastrointestinal system: Abdomen continues to be soft, nontender and nondistended.  Normal bowel sounds heard. Central nervous system: Alert and oriented. No focal neurological deficits. Skin: No rashes, lesions or ulcers   Data Reviewed: I have personally reviewed following labs and imaging studies  CBC:  Recent Labs Lab 01/26/16 1233 01/30/16 1635 02/01/16 0351 02/01/16 0955  WBC 15.3* 16.2* 17.0*  --   NEUTROABS  --   --   --  13.6*  HGB 10.6* 10.9* 10.0*  --   HCT 34.1* 33.6* 30.5*  --   MCV 84.2 83.4 85.9  --   PLT 383 460* 442*  --    Basic Metabolic Panel:  Recent Labs Lab 01/26/16 1233 01/30/16 1635  NA 138 136  K 4.7 4.8  CL 105 104  CO2 23 22  GLUCOSE 104* 104*  BUN 12 11  CREATININE 1.04* 0.99  CALCIUM 9.2  9.2   GFR: Estimated Creatinine Clearance: 35.9 mL/min (by C-G formula based on Cr of 0.99). Liver Function Tests:  Recent Labs Lab 01/26/16 1233 01/30/16 1635  AST 17 18  ALT 12* 13*  ALKPHOS 48 52  BILITOT 0.7 0.2*  PROT 6.8 7.0  ALBUMIN 2.9* 3.2*    Recent Labs Lab 01/30/16 1635  LIPASE 41   CBG:  Recent Labs Lab 01/31/16 0736 02/01/16 0623 02/02/16 0625  GLUCAP 89 97 90  Urine analysis:    Component Value Date/Time   COLORURINE YELLOW 01/30/2016 1945   APPEARANCEUR CLOUDY* 01/30/2016 1945   LABSPEC 1.022 01/30/2016 1945   PHURINE 6.5 01/30/2016 1945   GLUCOSEU NEGATIVE 01/30/2016 1945   HGBUR NEGATIVE 01/30/2016 1945   BILIRUBINUR NEGATIVE 01/30/2016 1945   KETONESUR 40* 01/30/2016 1945   PROTEINUR 30*  01/30/2016 1945   UROBILINOGEN 0.2 05/05/2015 1840   NITRITE NEGATIVE 01/30/2016 1945   LEUKOCYTESUR SMALL* 01/30/2016 1945        Radiology Studies: No results found.      Scheduled Meds: . ciprofloxacin  500 mg Oral BID  . enoxaparin (LOVENOX) injection  40 mg Subcutaneous Daily  . escitalopram  5 mg Oral QHS  . feeding supplement (ENSURE ENLIVE)  237 mL Oral BID BM  . metroNIDAZOLE  500 mg Oral Q8H  . multivitamin with minerals  1 tablet Oral Daily   Continuous Infusions:    LOS: 2 days    Time spent: 25 minutes    Gilles Chiquito, MD [facility] Pager [phone]  If 7PM-7AM, please contact night-coverage [email or web address] Password [facility] 02/02/2016, 10:36 AM

## 2016-02-02 NOTE — Discharge Instructions (Signed)
Charlene [name] - -  You were diagnosed with diverticulitis and you need to be on antibiotics for 8 more days when you go home.    Follow with Primary MD Precious Reel, MD in 7 days   Get CBC, CMP and resolution of your symptoms checked  by Dr. Virgina Jock next visit.    Activity: As tolerated use walker/cane & assistance as needed   Disposition Home    Diet:   Heart Healthy with ensure supplementation.    On your next visit with your primary care physician please Get Medicines reviewed and adjusted.   Please request your Primary MD to go over all Hospital Tests and Procedure/Radiological results at the follow up, please get all Hospital records sent to your Primary MD by signing hospital release before you go home.   If you experience worsening of your admission symptoms, develop shortness of breath, life threatening emergency, suicidal or homicidal thoughts you must seek medical attention immediately by calling 911 or calling your MD immediately  if symptoms less severe.  You Must read complete instructions/literature along with all the possible adverse reactions/side effects for all the Medicines you take and that have been prescribed to you. Take any new Medicines after you have completely understood and accpet all the possible adverse reactions/side effects.     Do not drive if taking Pain medications.    Do not take more than prescribed Pain, Sleep and Anxiety Medications  Special Instructions: If you have smoked or chewed Tobacco  in the last 2 yrs please stop smoking, stop any regular Alcohol  and or any Recreational drug use.  Wear Seat belts while driving.   Please note  You were cared for by a hospitalist during your hospital stay. If you have any questions about your discharge medications or the care you received while you were in the hospital after you are discharged, you can call the unit and asked to speak with the hospitalist on call if the hospitalist that took care of  you is not available. Once you are discharged, your primary care physician will handle any further medical issues. Please note that NO REFILLS for any discharge medications will be authorized once you are discharged, as it is imperative that you return to your primary care physician (or establish a relationship with a primary care physician if you do not have one) for your aftercare needs so that they can reassess your need for medications and monitor your lab values.  Ciprofloxacin tablets What is this medicine? CIPROFLOXACIN (sip roe FLOX a sin) is a quinolone antibiotic. It is used to treat certain kinds of bacterial infections. It will not work for colds, flu, or other viral infections. This medicine may be used for other purposes; ask your health care provider or pharmacist if you have questions. What should I tell my health care provider before I take this medicine? They need to know if you have any of these conditions: -bone problems -cerebral disease -history of low levels of potassium in the blood -joint problems -irregular heartbeat -kidney disease -myasthenia gravis -seizures -tendon problems -tingling of the fingers or toes, or other nerve disorder -an unusual or allergic reaction to ciprofloxacin, other antibiotics or medicines, foods, dyes, or preservatives -pregnant or trying to get pregnant -breast-feeding How should I use this medicine? Take this medicine by mouth with a glass of water. Follow the directions on the prescription label. Take your medicine at regular intervals. Do not take your medicine more often than directed. Take  all of your medicine as directed even if you think your are better. Do not skip doses or stop your medicine early. You can take this medicine with food or on an empty stomach. It can be taken with a meal that contains dairy or calcium, but do not take it alone with a dairy product, like milk or yogurt or calcium-fortified juice. A special MedGuide  will be given to you by the pharmacist with each prescription and refill. Be sure to read this information carefully each time. Talk to your pediatrician regarding the use of this medicine in children. Special care may be needed. Overdosage: If you think you have taken too much of this medicine contact a poison control center or emergency room at once. NOTE: This medicine is only for you. Do not share this medicine with others. What if I miss a dose? If you miss a dose, take it as soon as you can. If it is almost time for your next dose, take only that dose. Do not take double or extra doses. What may interact with this medicine? Do not take this medicine with any of the following medications: -cisapride -droperidol -terfenadine -tizanidine This medicine may also interact with the following medications: -antacids -birth control pills -caffeine -cyclosporin -didanosine (ddI) buffered tablets or powder -medicines for diabetes -medicines for inflammation like ibuprofen, naproxen -methotrexate -multivitamins -omeprazole -phenytoin -probenecid -sucralfate -theophylline -warfarin This list may not describe all possible interactions. Give your health care provider a list of all the medicines, herbs, non-prescription drugs, or dietary supplements you use. Also tell them if you smoke, drink alcohol, or use illegal drugs. Some items may interact with your medicine. What should I watch for while using this medicine? Tell your doctor or health care professional if your symptoms do not improve. Do not treat diarrhea with over the counter products. Contact your doctor if you have diarrhea that lasts more than 2 days or if it is severe and watery. You may get drowsy or dizzy. Do not drive, use machinery, or do anything that needs mental alertness until you know how this medicine affects you. Do not stand or sit up quickly, especially if you are an older patient. This reduces the risk of dizzy or  fainting spells. This medicine can make you more sensitive to the sun. Keep out of the sun. If you cannot avoid being in the sun, wear protective clothing and use sunscreen. Do not use sun lamps or tanning beds/booths. Avoid antacids, aluminum, calcium, iron, magnesium, and zinc products for 6 hours before and 2 hours after taking a dose of this medicine. What side effects may I notice from receiving this medicine? Side effects that you should report to your doctor or health care professional as soon as possible: -allergic reactions like skin rash or hives, swelling of the face, lips, or tongue -anxious -confusion -depressed mood -diarrhea -fast, irregular heartbeat -hallucination, loss of contact with reality -joint, muscle, or tendon pain or swelling -pain, tingling, numbness in the hands or feet -suicidal thoughts or other mood changes -sunburn -unusually weak or tired Side effects that usually do not require medical attention (report to your doctor or health care professional if they continue or are bothersome): -dry mouth -headache -nausea -trouble sleeping This list may not describe all possible side effects. Call your doctor for medical advice about side effects. You may report side effects to FDA at 1-800-FDA-1088. Where should I keep my medicine? Keep out of the reach of children. Store at room temperature  below 30 degrees C (86 degrees F). Keep container tightly closed. Throw away any unused medicine after the expiration date. NOTE: This sheet is a summary. It may not cover all possible information. If you have questions about this medicine, talk to your doctor, pharmacist, or health care provider.    2016, Elsevier/Gold Standard. (2015-05-02 12:57:02)  Metronidazole tablets or capsules What is this medicine? METRONIDAZOLE (me troe NI da zole) is an antiinfective. It is used to treat certain kinds of bacterial and protozoal infections. It will not work for colds, flu, or  other viral infections. This medicine may be used for other purposes; ask your health care provider or pharmacist if you have questions. What should I tell my health care provider before I take this medicine? They need to know if you have any of these conditions: -anemia or other blood disorders -disease of the nervous system -fungal or yeast infection -if you drink alcohol containing drinks -liver disease -seizures -an unusual or allergic reaction to metronidazole, or other medicines, foods, dyes, or preservatives -pregnant or trying to get pregnant -breast-feeding How should I use this medicine? Take this medicine by mouth with a full glass of water. Follow the directions on the prescription label. Take your medicine at regular intervals. Do not take your medicine more often than directed. Take all of your medicine as directed even if you think you are better. Do not skip doses or stop your medicine early. Talk to your pediatrician regarding the use of this medicine in children. Special care may be needed. Overdosage: If you think you have taken too much of this medicine contact a poison control center or emergency room at once. NOTE: This medicine is only for you. Do not share this medicine with others. What if I miss a dose? If you miss a dose, take it as soon as you can. If it is almost time for your next dose, take only that dose. Do not take double or extra doses. What may interact with this medicine? Do not take this medicine with any of the following medications: -alcohol or any product that contains alcohol -amprenavir oral solution -cisapride -disulfiram -dofetilide -dronedarone -paclitaxel injection -pimozide -ritonavir oral solution -sertraline oral solution -sulfamethoxazole-trimethoprim injection -thioridazine -ziprasidone This medicine may also interact with the following medications: -birth control pills -cimetidine -lithium -other medicines that prolong the QT  interval (cause an abnormal heart rhythm) -phenobarbital -phenytoin -warfarin This list may not describe all possible interactions. Give your health care provider a list of all the medicines, herbs, non-prescription drugs, or dietary supplements you use. Also tell them if you smoke, drink alcohol, or use illegal drugs. Some items may interact with your medicine. What should I watch for while using this medicine? Tell your doctor or health care professional if your symptoms do not improve or if they get worse. You may get drowsy or dizzy. Do not drive, use machinery, or do anything that needs mental alertness until you know how this medicine affects you. Do not stand or sit up quickly, especially if you are an older patient. This reduces the risk of dizzy or fainting spells. Avoid alcoholic drinks while you are taking this medicine and for three days afterward. Alcohol may make you feel dizzy, sick, or flushed. If you are being treated for a sexually transmitted disease, avoid sexual contact until you have finished your treatment. Your sexual partner may also need treatment. What side effects may I notice from receiving this medicine? Side effects that you should report  to your doctor or health care professional as soon as possible: -allergic reactions like skin rash or hives, swelling of the face, lips, or tongue -confusion, clumsiness -difficulty speaking -discolored or sore mouth -dizziness -fever, infection -numbness, tingling, pain or weakness in the hands or feet -trouble passing urine or change in the amount of urine -redness, blistering, peeling or loosening of the skin, including inside the mouth -seizures -unusually weak or tired -vaginal irritation, dryness, or discharge Side effects that usually do not require medical attention (report to your doctor or health care professional if they continue or are bothersome): -diarrhea -headache -irritability -metallic  taste -nausea -stomach pain or cramps -trouble sleeping This list may not describe all possible side effects. Call your doctor for medical advice about side effects. You may report side effects to FDA at 1-800-FDA-1088. Where should I keep my medicine? Keep out of the reach of children. Store at room temperature below 25 degrees C (77 degrees F). Protect from light. Keep container tightly closed. Throw away any unused medicine after the expiration date. NOTE: This sheet is a summary. It may not cover all possible information. If you have questions about this medicine, talk to your doctor, pharmacist, or health care provider.    2016, Elsevier/Gold Standard. (2013-04-28 14:08:39)

## 2016-02-02 NOTE — Discharge Summary (Signed)
Physician Discharge Summary  LANGSTON KELTER [id number] DOB: October 03, 1928 DOA: 01/30/2016  PCP: Precious Reel, MD  Admit date: 01/30/2016 Discharge date: 02/02/2016  Recommendations for Outpatient Follow-up:  1. Pt will need to follow up with PCP in 2-3 weeks post discharge 2. Please obtain BMP to evaluate electrolytes and kidney function 3. Please also check CBC to evaluate Hg and Hct levels 4. Resolution of symptoms  Discharge Diagnoses:  Principal Problem:   Acute diverticulitis Active Problems:   Depression   GERD (gastroesophageal reflux disease)   Hemorrhoids   Acute diarrhea   Protein-calorie malnutrition, severe  Discharge Condition: Stable  Diet recommendation: Heart healthy diet   History of present illness:  82 year old female who was recently diagnosed with diverticulitis on 4/23 and started on Augmentin-presented to the ED with worsening abdominal pain. She was found to have ongoing diverticulitis on repeat CT scan of the abdomen, and admitted for IV antibiotic therapy.   Hospital Course:   Acute diverticulitis Patient presented with pain, nausea, diarrhea.  She was managed with ciprofloxacin and flagyl.  She slowly improved, was tolerating a solid diet on day of discharge, ambulated well and had a BM.  She was sent home to complete a 10 day course of antibiotics.  We discussed dietary changes to help this from happening again. CBC for white count should be checked to ensure trending down.  She was clinically close to baseline on her day of discharge.  She was also discharged with a small dose of nausea and pain medication.   Depression Continued on home lexapro.  Protein-calorie malnutrition, severe Ensure started and she was sent home with an order for ensure BID.    Procedures/Studies: Ct Abdomen Pelvis W Contrast  01/30/2016  CLINICAL DATA:  Acute onset of worsening generalized abdominal pain, with nausea and diarrhea. Initial encounter. EXAM: CT ABDOMEN AND  PELVIS WITH CONTRAST TECHNIQUE: Multidetector CT imaging of the abdomen and pelvis was performed using the standard protocol following bolus administration of intravenous contrast. CONTRAST:  100 mL ISOVUE-300 IOPAMIDOL (ISOVUE-300) INJECTION 61% COMPARISON:  CT of the abdomen and pelvis from 01/26/2016 FINDINGS: Minimal bibasilar atelectasis or scarring is seen. A small hiatal hernia is noted. Trace pericardial fluid remains within normal limits. A tiny 6 mm cyst is noted at the left hepatic lobe. The liver and spleen are otherwise unremarkable. Stones are seen dependently within the gallbladder. The gallbladder is otherwise unremarkable in appearance. The gallbladder is within normal limits. The pancreas and adrenal glands are unremarkable. Scattered bilateral renal cysts are seen, more prominent on the left. Mild nonspecific perinephric stranding is noted on the left. There is no evidence of hydronephrosis. No renal or ureteral stones are identified. The small bowel is unremarkable in appearance. The stomach is within normal limits. No acute vascular abnormalities are seen. Scattered calcification is seen along the abdominal aorta and its branches. There is minimal ectasia of the distal abdominal aorta, without evidence of aneurysmal dilatation. A small periumbilical hernia is noted, just to the right of the umbilicus, containing only fat, with minimal soft tissue inflammation. The appendix is not definitely characterized; there is no evidence for appendicitis. Focal wall thickening is noted about the mid sigmoid colon, with associated trace free fluid and soft tissue inflammation, compatible with acute diverticulitis. Diffuse diverticulitis involves the sigmoid colon, and scattered diverticulosis is noted along the descending colon. There is no evidence of perforation or abscess formation at this time. Scattered adjacent small nodules are seen. An underlying colonic  mass cannot be entirely excluded. The bladder  is mildly distended and grossly unremarkable. The patient is status post hysterectomy. No suspicious adnexal masses are seen. Postoperative change is noted along the pelvic sidewall bilaterally. No inguinal lymphadenopathy is seen. No acute osseous abnormalities are identified. Facet disease is noted at the lower lumbar spine. IMPRESSION: 1. Acute diverticulitis at the mid sigmoid colon, with focal wall thickening, soft tissue inflammation and trace free fluid. No evidence of perforation or abscess formation at this time. 2. Scattered adjacent nodules noted. Underlying mass cannot be entirely excluded. Would recommend sigmoidoscopy after completion of treatment for diverticulitis, if deemed clinically appropriate. 3. Diffuse diverticulosis of the sigmoid colon, and scattered diverticulosis along the descending colon. 4. Small hiatal hernia noted. 5. Small periumbilical hernia, just to the right of the umbilicus, containing only fat, with minimal soft tissue inflammation. 6. Tiny hepatic cyst noted. 7. Scattered bilateral renal cysts seen, more prominent on the left. Electronically Signed   By: Garald Balding M.D.   On: 01/30/2016 23:44   Ct Abdomen Pelvis W Contrast  01/26/2016  CLINICAL DATA:  LOWER ABD AND PELVIC PAIN WITH OCCASIONAL NAUSEA SINCE Wednesday (3DAYS) HX UTERINE CA WITH HYSTERECTOMY 15 YEARS PRIOR EXAM: CT ABDOMEN AND PELVIS WITH CONTRAST TECHNIQUE: Multidetector CT imaging of the abdomen and pelvis was performed using the standard protocol following bolus administration of intravenous contrast. CONTRAST:  85mL ISOVUE-300 IOPAMIDOL (ISOVUE-300) INJECTION 61% COMPARISON:  CT 05/05/2015 FINDINGS: Lower chest: Lung bases are clear. Hepatobiliary: No focal hepatic lesion. No biliary duct dilatation. Small gallstones in lumen gallbladder. Common bile duct is normal. Pancreas: No pancreatic duct dilatation.  No pancreatic inflammation Spleen: Normal spleen Adrenals/urinary tract: Low-density lesions in  the kidneys wiht simple fluid attenuation ureters and bladder normal. Stomach/Bowel: Stomach, small-bowel appendix and cecum are normal. Multiple diverticula of the sigmoid colon. There is a region of circumferential bowel wall thickening and pericolonic inflammation in the mid sigmoid colon (image 57, series 2). This most suggestive acute diverticulitis. Similar diverticulitis pattern on CT of 05/05/2015 slightly more distally in the sigmoid colon. No evidence of perforation or abscess. Vascular/Lymphatic: Abdominal aorta is normal caliber with atherosclerotic calcification. There is no retroperitoneal or periportal lymphadenopathy. No pelvic lymphadenopathy. Reproductive: Post hysterectomy. Other: Small free fluid the pelvis related to diverticulitis Musculoskeletal: Degenerate changes of the spine. IMPRESSION: 1. Acute sigmoid diverticulitis similar to comparison CT of 05/05/2015 but a slightly more proximal location. As there is circumferential narrowing of the sigmoid colon at this level, recommend follow-up CT with contrast to exclude unlikely underlying neoplasm. 2. Small  free fluid in the pelvis related to diverticulitis. 3.  Atherosclerotic calcification of the abdominal aorta. 4. Low-density lesions in the kidneys likely represent benign cysts. Electronically Signed   By: Suzy Bouchard M.D.   On: 01/26/2016 14:54     Consultations:  Nutrition  Antibiotics:  Ciprofloxacin 4/28 - 5/7  Flagyle 4/28 - 5/7  Discharge Exam: Filed Vitals:   02/01/16 2045 02/02/16 0537  BP: 125/35 115/50  Pulse: 66 53  Temp: 98 F (36.7 C) 98.5 F (36.9 C)  Resp: 18 18   Filed Vitals:   01/31/16 2001 02/01/16 0451 02/01/16 2045 02/02/16 0537  BP: 100/34 120/39 125/35 115/50  Pulse: 69 62 66 53  Temp: 100.3 F (37.9 C) 97.8 F (36.6 C) 98 F (36.7 C) 98.5 F (36.9 C)  TempSrc: Oral Oral Oral Oral  Resp: 18 18 18 18   Height:      Weight:  SpO2: 94% 91% 95% 95%    General exam: Appears  calm and comfortable, elderly woman Respiratory system: Respiratory effort normal. Cardiovascular system: S1 & S2 heard, RR, NR. No pedal edema Gastrointestinal system: Abdomen continues to be soft, nontender and nondistended. Normal bowel sounds heard. Central nervous system: Alert and oriented. No focal neurological deficits. Skin: No rashes, lesions or ulcers  Discharge Instructions     Medication List    STOP taking these medications        amoxicillin-clavulanate 875-125 MG tablet  Commonly known as:  AUGMENTIN      TAKE these medications        ciprofloxacin 500 MG tablet  Commonly known as:  CIPRO  Take 1 tablet (500 mg total) by mouth 2 (two) times daily. For 8 days     escitalopram 5 MG tablet  Commonly known as:  LEXAPRO  takes 5mg  by mouth once daily at bedtime     famotidine 10 MG chewable tablet  Commonly known as:  PEPCID AC  Chew 10 mg by mouth 2 (two) times daily as needed for heartburn.     feeding supplement (ENSURE ENLIVE) Liqd  Take 237 mLs by mouth 2 (two) times daily between meals.     HYDROcodone-acetaminophen 5-325 MG tablet  Commonly known as:  NORCO  Take 0.5-1 tablets by mouth every 6 (six) hours as needed for severe pain.     metroNIDAZOLE 500 MG tablet  Commonly known as:  FLAGYL  Take 1 tablet (500 mg total) by mouth 3 (three) times daily. For 8 days     MULTIVITAMIN ADULT PO  Take 1 tablet by mouth daily.     NASACORT AQ 55 MCG/ACT Aero nasal inhaler  Generic drug:  triamcinolone  Place 2 sprays into the nose daily as needed (for allergies).     ondansetron 4 MG tablet  Commonly known as:  ZOFRAN  Take 1 tablet (4 mg total) by mouth every 8 (eight) hours as needed for nausea or vomiting.     OVER THE COUNTER MEDICATION  Take 2 tablets by mouth daily. "Slippery elm"          The results of significant diagnostics from this hospitalization (including imaging, microbiology, ancillary and laboratory) are listed below for  reference.     Microbiology: No results found for this or any previous visit (from the past 240 hour(s)).   Labs: Basic Metabolic Panel:  Recent Labs Lab 01/26/16 1233 01/30/16 1635  NA 138 136  K 4.7 4.8  CL 105 104  CO2 23 22  GLUCOSE 104* 104*  BUN 12 11  CREATININE 1.04* 0.99  CALCIUM 9.2 9.2   Liver Function Tests:  Recent Labs Lab 01/26/16 1233 01/30/16 1635  AST 17 18  ALT 12* 13*  ALKPHOS 48 52  BILITOT 0.7 0.2*  PROT 6.8 7.0  ALBUMIN 2.9* 3.2*    Recent Labs Lab 01/30/16 1635  LIPASE 41   No results for input(s): AMMONIA in the last 168 hours. CBC:  Recent Labs Lab 01/26/16 1233 01/30/16 1635 02/01/16 0351 02/01/16 0955  WBC 15.3* 16.2* 17.0*  --   NEUTROABS  --   --   --  13.6*  HGB 10.6* 10.9* 10.0*  --   HCT 34.1* 33.6* 30.5*  --   MCV 84.2 83.4 85.9  --   PLT 383 460* 442*  --     CBG:  Recent Labs Lab 01/31/16 0736 02/01/16 0623 02/02/16 0625  GLUCAP 89 97 90  SIGNED: Time coordinating discharge:25 minutes  Gilles Chiquito, MD  [facility] 02/02/2016, 10:32 AM Pager [phone]  If 7PM-7AM, please contact night-coverage [email or web address] Password [email or web address]

## 2016-02-12 DIAGNOSIS — D649 Anemia, unspecified: Secondary | ICD-10-CM | POA: Diagnosis not present

## 2016-02-12 DIAGNOSIS — D6489 Other specified anemias: Secondary | ICD-10-CM | POA: Diagnosis not present

## 2016-03-05 DIAGNOSIS — D6489 Other specified anemias: Secondary | ICD-10-CM | POA: Diagnosis not present

## 2016-03-05 DIAGNOSIS — D649 Anemia, unspecified: Secondary | ICD-10-CM | POA: Diagnosis not present

## 2016-03-05 DIAGNOSIS — E46 Unspecified protein-calorie malnutrition: Secondary | ICD-10-CM | POA: Diagnosis not present

## 2016-03-16 DIAGNOSIS — R2681 Unsteadiness on feet: Secondary | ICD-10-CM | POA: Diagnosis not present

## 2016-03-16 DIAGNOSIS — M150 Primary generalized (osteo)arthritis: Secondary | ICD-10-CM | POA: Diagnosis not present

## 2016-03-20 DIAGNOSIS — R2681 Unsteadiness on feet: Secondary | ICD-10-CM | POA: Diagnosis not present

## 2016-03-20 DIAGNOSIS — M150 Primary generalized (osteo)arthritis: Secondary | ICD-10-CM | POA: Diagnosis not present

## 2016-03-25 DIAGNOSIS — R2681 Unsteadiness on feet: Secondary | ICD-10-CM | POA: Diagnosis not present

## 2016-03-25 DIAGNOSIS — M150 Primary generalized (osteo)arthritis: Secondary | ICD-10-CM | POA: Diagnosis not present

## 2016-03-27 DIAGNOSIS — M150 Primary generalized (osteo)arthritis: Secondary | ICD-10-CM | POA: Diagnosis not present

## 2016-03-27 DIAGNOSIS — R2681 Unsteadiness on feet: Secondary | ICD-10-CM | POA: Diagnosis not present

## 2016-03-31 DIAGNOSIS — F329 Major depressive disorder, single episode, unspecified: Secondary | ICD-10-CM | POA: Diagnosis not present

## 2016-03-31 DIAGNOSIS — K5730 Diverticulosis of large intestine without perforation or abscess without bleeding: Secondary | ICD-10-CM | POA: Diagnosis not present

## 2016-03-31 DIAGNOSIS — E46 Unspecified protein-calorie malnutrition: Secondary | ICD-10-CM | POA: Diagnosis not present

## 2016-03-31 DIAGNOSIS — R2681 Unsteadiness on feet: Secondary | ICD-10-CM | POA: Diagnosis not present

## 2016-03-31 DIAGNOSIS — D6489 Other specified anemias: Secondary | ICD-10-CM | POA: Diagnosis not present

## 2016-03-31 DIAGNOSIS — R627 Adult failure to thrive: Secondary | ICD-10-CM | POA: Diagnosis not present

## 2016-03-31 DIAGNOSIS — Z6823 Body mass index (BMI) 23.0-23.9, adult: Secondary | ICD-10-CM | POA: Diagnosis not present

## 2016-03-31 DIAGNOSIS — M150 Primary generalized (osteo)arthritis: Secondary | ICD-10-CM | POA: Diagnosis not present

## 2016-04-06 DIAGNOSIS — M150 Primary generalized (osteo)arthritis: Secondary | ICD-10-CM | POA: Diagnosis not present

## 2016-04-06 DIAGNOSIS — R2681 Unsteadiness on feet: Secondary | ICD-10-CM | POA: Diagnosis not present

## 2016-04-28 DIAGNOSIS — F329 Major depressive disorder, single episode, unspecified: Secondary | ICD-10-CM | POA: Diagnosis not present

## 2016-04-28 DIAGNOSIS — R030 Elevated blood-pressure reading, without diagnosis of hypertension: Secondary | ICD-10-CM | POA: Diagnosis not present

## 2016-04-28 DIAGNOSIS — E46 Unspecified protein-calorie malnutrition: Secondary | ICD-10-CM | POA: Diagnosis not present

## 2016-04-28 DIAGNOSIS — D649 Anemia, unspecified: Secondary | ICD-10-CM | POA: Diagnosis not present

## 2016-04-28 DIAGNOSIS — I700 Atherosclerosis of aorta: Secondary | ICD-10-CM | POA: Diagnosis not present

## 2016-06-09 DIAGNOSIS — M9901 Segmental and somatic dysfunction of cervical region: Secondary | ICD-10-CM | POA: Diagnosis not present

## 2016-06-09 DIAGNOSIS — M546 Pain in thoracic spine: Secondary | ICD-10-CM | POA: Diagnosis not present

## 2016-06-09 DIAGNOSIS — M542 Cervicalgia: Secondary | ICD-10-CM | POA: Diagnosis not present

## 2016-06-09 DIAGNOSIS — M9902 Segmental and somatic dysfunction of thoracic region: Secondary | ICD-10-CM | POA: Diagnosis not present

## 2016-06-10 DIAGNOSIS — M9902 Segmental and somatic dysfunction of thoracic region: Secondary | ICD-10-CM | POA: Diagnosis not present

## 2016-06-10 DIAGNOSIS — M542 Cervicalgia: Secondary | ICD-10-CM | POA: Diagnosis not present

## 2016-06-10 DIAGNOSIS — M9901 Segmental and somatic dysfunction of cervical region: Secondary | ICD-10-CM | POA: Diagnosis not present

## 2016-06-10 DIAGNOSIS — M546 Pain in thoracic spine: Secondary | ICD-10-CM | POA: Diagnosis not present

## 2016-06-11 DIAGNOSIS — M9902 Segmental and somatic dysfunction of thoracic region: Secondary | ICD-10-CM | POA: Diagnosis not present

## 2016-06-11 DIAGNOSIS — M9901 Segmental and somatic dysfunction of cervical region: Secondary | ICD-10-CM | POA: Diagnosis not present

## 2016-06-11 DIAGNOSIS — M546 Pain in thoracic spine: Secondary | ICD-10-CM | POA: Diagnosis not present

## 2016-06-11 DIAGNOSIS — M542 Cervicalgia: Secondary | ICD-10-CM | POA: Diagnosis not present

## 2016-06-15 DIAGNOSIS — M9901 Segmental and somatic dysfunction of cervical region: Secondary | ICD-10-CM | POA: Diagnosis not present

## 2016-06-15 DIAGNOSIS — M542 Cervicalgia: Secondary | ICD-10-CM | POA: Diagnosis not present

## 2016-06-15 DIAGNOSIS — M9902 Segmental and somatic dysfunction of thoracic region: Secondary | ICD-10-CM | POA: Diagnosis not present

## 2016-06-15 DIAGNOSIS — M546 Pain in thoracic spine: Secondary | ICD-10-CM | POA: Diagnosis not present

## 2016-06-17 DIAGNOSIS — M542 Cervicalgia: Secondary | ICD-10-CM | POA: Diagnosis not present

## 2016-06-17 DIAGNOSIS — M9902 Segmental and somatic dysfunction of thoracic region: Secondary | ICD-10-CM | POA: Diagnosis not present

## 2016-06-17 DIAGNOSIS — M9901 Segmental and somatic dysfunction of cervical region: Secondary | ICD-10-CM | POA: Diagnosis not present

## 2016-06-17 DIAGNOSIS — M546 Pain in thoracic spine: Secondary | ICD-10-CM | POA: Diagnosis not present

## 2016-06-18 DIAGNOSIS — M9902 Segmental and somatic dysfunction of thoracic region: Secondary | ICD-10-CM | POA: Diagnosis not present

## 2016-06-18 DIAGNOSIS — M546 Pain in thoracic spine: Secondary | ICD-10-CM | POA: Diagnosis not present

## 2016-06-18 DIAGNOSIS — M9901 Segmental and somatic dysfunction of cervical region: Secondary | ICD-10-CM | POA: Diagnosis not present

## 2016-06-18 DIAGNOSIS — M542 Cervicalgia: Secondary | ICD-10-CM | POA: Diagnosis not present

## 2016-06-22 DIAGNOSIS — M9901 Segmental and somatic dysfunction of cervical region: Secondary | ICD-10-CM | POA: Diagnosis not present

## 2016-06-22 DIAGNOSIS — M9902 Segmental and somatic dysfunction of thoracic region: Secondary | ICD-10-CM | POA: Diagnosis not present

## 2016-06-22 DIAGNOSIS — M546 Pain in thoracic spine: Secondary | ICD-10-CM | POA: Diagnosis not present

## 2016-06-22 DIAGNOSIS — M542 Cervicalgia: Secondary | ICD-10-CM | POA: Diagnosis not present

## 2016-06-25 DIAGNOSIS — M9902 Segmental and somatic dysfunction of thoracic region: Secondary | ICD-10-CM | POA: Diagnosis not present

## 2016-06-25 DIAGNOSIS — M546 Pain in thoracic spine: Secondary | ICD-10-CM | POA: Diagnosis not present

## 2016-06-25 DIAGNOSIS — M9901 Segmental and somatic dysfunction of cervical region: Secondary | ICD-10-CM | POA: Diagnosis not present

## 2016-06-25 DIAGNOSIS — M542 Cervicalgia: Secondary | ICD-10-CM | POA: Diagnosis not present

## 2016-06-29 DIAGNOSIS — M9902 Segmental and somatic dysfunction of thoracic region: Secondary | ICD-10-CM | POA: Diagnosis not present

## 2016-06-29 DIAGNOSIS — M546 Pain in thoracic spine: Secondary | ICD-10-CM | POA: Diagnosis not present

## 2016-06-29 DIAGNOSIS — M542 Cervicalgia: Secondary | ICD-10-CM | POA: Diagnosis not present

## 2016-06-29 DIAGNOSIS — M9901 Segmental and somatic dysfunction of cervical region: Secondary | ICD-10-CM | POA: Diagnosis not present

## 2016-07-01 DIAGNOSIS — M546 Pain in thoracic spine: Secondary | ICD-10-CM | POA: Diagnosis not present

## 2016-07-01 DIAGNOSIS — M9902 Segmental and somatic dysfunction of thoracic region: Secondary | ICD-10-CM | POA: Diagnosis not present

## 2016-07-01 DIAGNOSIS — M9901 Segmental and somatic dysfunction of cervical region: Secondary | ICD-10-CM | POA: Diagnosis not present

## 2016-07-01 DIAGNOSIS — M542 Cervicalgia: Secondary | ICD-10-CM | POA: Diagnosis not present

## 2016-07-06 DIAGNOSIS — M9901 Segmental and somatic dysfunction of cervical region: Secondary | ICD-10-CM | POA: Diagnosis not present

## 2016-07-06 DIAGNOSIS — M9902 Segmental and somatic dysfunction of thoracic region: Secondary | ICD-10-CM | POA: Diagnosis not present

## 2016-07-06 DIAGNOSIS — M542 Cervicalgia: Secondary | ICD-10-CM | POA: Diagnosis not present

## 2016-07-06 DIAGNOSIS — M546 Pain in thoracic spine: Secondary | ICD-10-CM | POA: Diagnosis not present

## 2016-07-07 DIAGNOSIS — Z23 Encounter for immunization: Secondary | ICD-10-CM | POA: Diagnosis not present

## 2016-07-14 DIAGNOSIS — M9901 Segmental and somatic dysfunction of cervical region: Secondary | ICD-10-CM | POA: Diagnosis not present

## 2016-07-14 DIAGNOSIS — M9902 Segmental and somatic dysfunction of thoracic region: Secondary | ICD-10-CM | POA: Diagnosis not present

## 2016-07-14 DIAGNOSIS — M546 Pain in thoracic spine: Secondary | ICD-10-CM | POA: Diagnosis not present

## 2016-07-14 DIAGNOSIS — M542 Cervicalgia: Secondary | ICD-10-CM | POA: Diagnosis not present

## 2016-07-29 DIAGNOSIS — M546 Pain in thoracic spine: Secondary | ICD-10-CM | POA: Diagnosis not present

## 2016-07-29 DIAGNOSIS — M9901 Segmental and somatic dysfunction of cervical region: Secondary | ICD-10-CM | POA: Diagnosis not present

## 2016-07-29 DIAGNOSIS — M9902 Segmental and somatic dysfunction of thoracic region: Secondary | ICD-10-CM | POA: Diagnosis not present

## 2016-07-29 DIAGNOSIS — M542 Cervicalgia: Secondary | ICD-10-CM | POA: Diagnosis not present

## 2016-07-30 DIAGNOSIS — R627 Adult failure to thrive: Secondary | ICD-10-CM | POA: Diagnosis not present

## 2016-07-30 DIAGNOSIS — Z6824 Body mass index (BMI) 24.0-24.9, adult: Secondary | ICD-10-CM | POA: Diagnosis not present

## 2016-07-30 DIAGNOSIS — F325 Major depressive disorder, single episode, in full remission: Secondary | ICD-10-CM | POA: Diagnosis not present

## 2016-07-30 DIAGNOSIS — R030 Elevated blood-pressure reading, without diagnosis of hypertension: Secondary | ICD-10-CM | POA: Diagnosis not present

## 2016-08-11 DIAGNOSIS — H353134 Nonexudative age-related macular degeneration, bilateral, advanced atrophic with subfoveal involvement: Secondary | ICD-10-CM | POA: Diagnosis not present

## 2016-08-11 DIAGNOSIS — H35363 Drusen (degenerative) of macula, bilateral: Secondary | ICD-10-CM | POA: Diagnosis not present

## 2016-08-11 DIAGNOSIS — H353222 Exudative age-related macular degeneration, left eye, with inactive choroidal neovascularization: Secondary | ICD-10-CM | POA: Diagnosis not present

## 2016-08-11 DIAGNOSIS — H3570 Unspecified separation of retinal layers: Secondary | ICD-10-CM | POA: Diagnosis not present

## 2016-09-14 DIAGNOSIS — M9901 Segmental and somatic dysfunction of cervical region: Secondary | ICD-10-CM | POA: Diagnosis not present

## 2016-09-14 DIAGNOSIS — M9902 Segmental and somatic dysfunction of thoracic region: Secondary | ICD-10-CM | POA: Diagnosis not present

## 2016-09-14 DIAGNOSIS — M546 Pain in thoracic spine: Secondary | ICD-10-CM | POA: Diagnosis not present

## 2016-09-14 DIAGNOSIS — M542 Cervicalgia: Secondary | ICD-10-CM | POA: Diagnosis not present

## 2016-09-15 DIAGNOSIS — M9902 Segmental and somatic dysfunction of thoracic region: Secondary | ICD-10-CM | POA: Diagnosis not present

## 2016-09-15 DIAGNOSIS — M546 Pain in thoracic spine: Secondary | ICD-10-CM | POA: Diagnosis not present

## 2016-09-15 DIAGNOSIS — M542 Cervicalgia: Secondary | ICD-10-CM | POA: Diagnosis not present

## 2016-09-15 DIAGNOSIS — M9901 Segmental and somatic dysfunction of cervical region: Secondary | ICD-10-CM | POA: Diagnosis not present

## 2016-09-17 DIAGNOSIS — M9901 Segmental and somatic dysfunction of cervical region: Secondary | ICD-10-CM | POA: Diagnosis not present

## 2016-09-17 DIAGNOSIS — M9902 Segmental and somatic dysfunction of thoracic region: Secondary | ICD-10-CM | POA: Diagnosis not present

## 2016-09-17 DIAGNOSIS — M542 Cervicalgia: Secondary | ICD-10-CM | POA: Diagnosis not present

## 2016-09-17 DIAGNOSIS — M546 Pain in thoracic spine: Secondary | ICD-10-CM | POA: Diagnosis not present

## 2016-09-23 DIAGNOSIS — M9901 Segmental and somatic dysfunction of cervical region: Secondary | ICD-10-CM | POA: Diagnosis not present

## 2016-09-23 DIAGNOSIS — M546 Pain in thoracic spine: Secondary | ICD-10-CM | POA: Diagnosis not present

## 2016-09-23 DIAGNOSIS — M9902 Segmental and somatic dysfunction of thoracic region: Secondary | ICD-10-CM | POA: Diagnosis not present

## 2016-09-23 DIAGNOSIS — M542 Cervicalgia: Secondary | ICD-10-CM | POA: Diagnosis not present

## 2016-11-03 ENCOUNTER — Other Ambulatory Visit: Payer: Self-pay | Admitting: Internal Medicine

## 2016-11-03 DIAGNOSIS — R1032 Left lower quadrant pain: Secondary | ICD-10-CM | POA: Diagnosis not present

## 2016-11-03 DIAGNOSIS — K5730 Diverticulosis of large intestine without perforation or abscess without bleeding: Secondary | ICD-10-CM | POA: Diagnosis not present

## 2016-11-03 DIAGNOSIS — D649 Anemia, unspecified: Secondary | ICD-10-CM | POA: Diagnosis not present

## 2016-11-03 DIAGNOSIS — M545 Low back pain: Secondary | ICD-10-CM | POA: Diagnosis not present

## 2016-11-04 ENCOUNTER — Ambulatory Visit
Admission: RE | Admit: 2016-11-04 | Discharge: 2016-11-04 | Disposition: A | Discharge status: Home / Self Care | Payer: Medicare Other | Source: Ambulatory Visit | Attending: Internal Medicine | Admitting: Internal Medicine

## 2016-11-04 DIAGNOSIS — R1032 Left lower quadrant pain: Secondary | ICD-10-CM | POA: Diagnosis not present

## 2016-11-04 DIAGNOSIS — K5730 Diverticulosis of large intestine without perforation or abscess without bleeding: Secondary | ICD-10-CM

## 2016-11-04 MED ORDER — IOPAMIDOL (ISOVUE-300) INJECTION 61%
100.0000 mL | Freq: Once | INTRAVENOUS | Status: AC | PRN
  Administered 2016-11-04: 100 mL via INTRAVENOUS

## 2016-11-10 DIAGNOSIS — M9901 Segmental and somatic dysfunction of cervical region: Secondary | ICD-10-CM | POA: Diagnosis not present

## 2016-11-10 DIAGNOSIS — M542 Cervicalgia: Secondary | ICD-10-CM | POA: Diagnosis not present

## 2016-11-10 DIAGNOSIS — M9902 Segmental and somatic dysfunction of thoracic region: Secondary | ICD-10-CM | POA: Diagnosis not present

## 2016-11-10 DIAGNOSIS — M546 Pain in thoracic spine: Secondary | ICD-10-CM | POA: Diagnosis not present

## 2016-12-01 DIAGNOSIS — K5732 Diverticulitis of large intestine without perforation or abscess without bleeding: Secondary | ICD-10-CM | POA: Diagnosis not present

## 2016-12-02 ENCOUNTER — Other Ambulatory Visit: Payer: Self-pay | Admitting: General Surgery

## 2016-12-02 DIAGNOSIS — R197 Diarrhea, unspecified: Secondary | ICD-10-CM | POA: Diagnosis not present

## 2016-12-02 DIAGNOSIS — K5792 Diverticulitis of intestine, part unspecified, without perforation or abscess without bleeding: Secondary | ICD-10-CM

## 2016-12-22 ENCOUNTER — Ambulatory Visit
Admission: RE | Admit: 2016-12-22 | Discharge: 2016-12-22 | Disposition: A | Discharge status: Home / Self Care | Payer: Medicare Other | Source: Ambulatory Visit | Attending: General Surgery | Admitting: General Surgery

## 2016-12-22 ENCOUNTER — Other Ambulatory Visit: Payer: Self-pay | Admitting: General Surgery

## 2016-12-22 DIAGNOSIS — K5732 Diverticulitis of large intestine without perforation or abscess without bleeding: Secondary | ICD-10-CM | POA: Diagnosis not present

## 2016-12-22 DIAGNOSIS — K5792 Diverticulitis of intestine, part unspecified, without perforation or abscess without bleeding: Secondary | ICD-10-CM

## 2016-12-22 MED ORDER — IOPAMIDOL (ISOVUE-300) INJECTION 61%
100.0000 mL | Freq: Once | INTRAVENOUS | Status: AC | PRN
  Administered 2016-12-22: 100 mL via INTRAVENOUS

## 2016-12-29 DIAGNOSIS — K5732 Diverticulitis of large intestine without perforation or abscess without bleeding: Secondary | ICD-10-CM | POA: Diagnosis not present

## 2017-01-06 ENCOUNTER — Other Ambulatory Visit: Payer: Self-pay

## 2017-01-06 ENCOUNTER — Emergency Department (HOSPITAL_COMMUNITY)
Admission: EM | Admit: 2017-01-06 | Discharge: 2017-01-06 | Disposition: A | Discharge status: Home / Self Care | Payer: Medicare Other | Attending: Emergency Medicine | Admitting: Emergency Medicine

## 2017-01-06 ENCOUNTER — Encounter (HOSPITAL_COMMUNITY): Payer: Self-pay | Admitting: Emergency Medicine

## 2017-01-06 DIAGNOSIS — R197 Diarrhea, unspecified: Secondary | ICD-10-CM | POA: Diagnosis present

## 2017-01-06 DIAGNOSIS — E860 Dehydration: Secondary | ICD-10-CM | POA: Insufficient documentation

## 2017-01-06 DIAGNOSIS — R404 Transient alteration of awareness: Secondary | ICD-10-CM | POA: Diagnosis not present

## 2017-01-06 DIAGNOSIS — Z87891 Personal history of nicotine dependence: Secondary | ICD-10-CM | POA: Diagnosis not present

## 2017-01-06 DIAGNOSIS — R42 Dizziness and giddiness: Secondary | ICD-10-CM | POA: Diagnosis not present

## 2017-01-06 LAB — COMPREHENSIVE METABOLIC PANEL
ALBUMIN: 2.4 g/dL — AB (ref 3.5–5.0)
ALT: 13 U/L — ABNORMAL LOW (ref 14–54)
AST: 20 U/L (ref 15–41)
Alkaline Phosphatase: 43 U/L (ref 38–126)
Anion gap: 10 (ref 5–15)
BUN: 11 mg/dL (ref 6–20)
CHLORIDE: 103 mmol/L (ref 101–111)
CO2: 23 mmol/L (ref 22–32)
Calcium: 8.4 mg/dL — ABNORMAL LOW (ref 8.9–10.3)
Creatinine, Ser: 0.83 mg/dL (ref 0.44–1.00)
GFR calc Af Amer: >60 mL/min (ref >60)
GLUCOSE: 82 mg/dL (ref 65–99)
Potassium: 4.1 mmol/L (ref 3.5–5.1)
SODIUM: 136 mmol/L (ref 135–145)
Total Bilirubin: 0.7 mg/dL (ref 0.3–1.2)
Total Protein: 6.1 g/dL — ABNORMAL LOW (ref 6.5–8.1)

## 2017-01-06 LAB — CBC WITH DIFFERENTIAL/PLATELET
BASOS ABS: 0 10*3/uL (ref 0.0–0.1)
BASOS PCT: 0 %
EOS ABS: 0.3 10*3/uL (ref 0.0–0.7)
EOS PCT: 3 %
HCT: 32 % — ABNORMAL LOW (ref 36.0–46.0)
Hemoglobin: 10.4 g/dL — ABNORMAL LOW (ref 12.0–15.0)
Lymphocytes Relative: 11 %
Lymphs Abs: 1.3 10*3/uL (ref 0.7–4.0)
MCH: 27.9 pg (ref 26.0–34.0)
MCHC: 32.5 g/dL (ref 30.0–36.0)
MCV: 85.8 fL (ref 78.0–100.0)
MONO ABS: 0.8 10*3/uL (ref 0.1–1.0)
Monocytes Relative: 7 %
Neutro Abs: 9.1 10*3/uL — ABNORMAL HIGH (ref 1.7–7.7)
Neutrophils Relative %: 79 %
PLATELETS: 423 10*3/uL — AB (ref 150–400)
RBC: 3.73 MIL/uL — ABNORMAL LOW (ref 3.87–5.11)
RDW: 13.9 % (ref 11.5–15.5)
WBC: 11.6 10*3/uL — ABNORMAL HIGH (ref 4.0–10.5)

## 2017-01-06 LAB — C DIFFICILE QUICK SCREEN W PCR REFLEX
C DIFFICILE (CDIFF) INTERP: NOT DETECTED
C DIFFICILE (CDIFF) TOXIN: NEGATIVE
C Diff antigen: NEGATIVE

## 2017-01-06 LAB — MAGNESIUM: Magnesium: 1.8 mg/dL (ref 1.7–2.4)

## 2017-01-06 LAB — LIPASE, BLOOD: LIPASE: 18 U/L (ref 11–51)

## 2017-01-06 MED ORDER — MECLIZINE HCL 25 MG PO TABS
25.0000 mg | ORAL_TABLET | Freq: Once | ORAL | Status: AC
  Administered 2017-01-06: 25 mg via ORAL
  Filled 2017-01-06: qty 1

## 2017-01-06 MED ORDER — SODIUM CHLORIDE 0.9 % IV BOLUS (SEPSIS)
1000.0000 mL | Freq: Once | INTRAVENOUS | Status: AC
  Administered 2017-01-06: 1000 mL via INTRAVENOUS

## 2017-01-06 NOTE — Discharge Instructions (Signed)
Drink plenty of fluids.  Use imodium as instructed on the bottle.  Return for sudden worsening abdominal pain, weakness, inability to eat or drink.

## 2017-01-06 NOTE — ED Provider Notes (Signed)
[facility] DEPT Provider Note   CSN: [id number] Arrival date & time: 01/06/17  1444     History   Chief Complaint Chief Complaint  Patient presents with  . Diarrhea  . Dehydration    HPI Charlene [name] is a 83 y.o. female.  83 yo F with a chief complaint of diarrhea. Going on for the past couple months. Worsening over the past couple days. Patient feeling chronically fatigued and mildly worse. Called her surgeon who she has a colonoscopy scheduled in the next week is suggested she see her PCP. PCP suggested she come to the ED for fluids and evaluation. The patient is also complaining of some dizziness that she describes as vertigo. Worse when she turns her head. Resolves when sitting still. Patient has a history of BPPV and this feels the same. Denies recent head injury. Denies unilateral weakness.   The history is provided by the patient.  Diarrhea   This is a new problem. The current episode started more than 1 week ago. The problem occurs more than 10 times per day. The problem has been gradually worsening. The stool consistency is described as watery. Pertinent negatives include no vomiting, no chills, no headaches, no arthralgias and no myalgias.    Past Medical History:  Diagnosis Date  . Acute renal failure ([place])   . Allergy    seasonal  . BPPV (benign paroxysmal positional vertigo) 2012  . Depression   . Diverticulitis   . Dyslipidemia   . GERD (gastroesophageal reflux disease)   . Hemorrhoids   . Macular degeneration   . Urine frequency   . Uterine carcinoma [facility])     Patient Active Problem List   Diagnosis Date Noted  . Protein-calorie malnutrition, severe 02/01/2016  . Depression 01/31/2016  . GERD (gastroesophageal reflux disease) 01/31/2016  . Hemorrhoids 01/31/2016  . Acute diarrhea 01/31/2016  . Acute diverticulitis 05/05/2015  . Abnormal LFTs 05/05/2015  . Diverticulitis 03/13/2015  . Dyslipidemia 03/13/2015  . Lower abdominal pain 03/13/2015    . Leukocytosis 03/13/2015  . Acute renal failure ([place]) 03/13/2015    Past Surgical History:  Procedure Laterality Date  . ABDOMINAL HYSTERECTOMY    . EYE SURGERY     cataract removal bilateral 2012  . TONSILLECTOMY      OB History    No data available       Home Medications    Prior to Admission medications   Medication Sig Start Date End Date Taking? Authorizing Provider  acetaminophen (TYLENOL) 500 MG tablet Take 500 mg by mouth every 6 (six) hours as needed for moderate pain.    Yes Historical Provider, MD  bismuth subsalicylate (PEPTO BISMOL) 262 MG/15ML suspension Take 30 mLs by mouth every 6 (six) hours as needed for indigestion or diarrhea or loose stools.   Yes Historical Provider, MD  Carboxymethylcellul-Glycerin (LUBRICATING EYE DROPS OP) Apply 1 drop to eye daily as needed (dry eyes).   Yes Historical Provider, MD  ciprofloxacin (CIPRO) 500 MG tablet Take 500 mg by mouth 2 (two) times daily.   Yes Historical Provider, MD  famotidine (PEPCID AC) 10 MG chewable tablet Chew 10 mg by mouth 2 (two) times daily as needed for heartburn.   Yes Historical Provider, MD  lactose free nutrition (BOOST) LIQD Take 237 mLs by mouth daily.   Yes Historical Provider, MD  metroNIDAZOLE (FLAGYL) 500 MG tablet Take 500 mg by mouth 3 (three) times daily.   Yes Historical Provider, MD  Multiple Vitamins-Minerals (MULTIVITAMIN ADULT  PO) Take 1 tablet by mouth daily.    Yes Historical Provider, MD  Multiple Vitamins-Minerals (PRESERVISION AREDS 2) CAPS Take 1 capsule by mouth daily.   Yes Historical Provider, MD  traMADol (ULTRAM) 50 MG tablet Take 50 mg by mouth 2 (two) times daily as needed for moderate pain.   Yes Historical Provider, MD  triamcinolone (NASACORT AQ) 55 MCG/ACT AERO nasal inhaler Place 2 sprays into the nose daily as needed (for allergies).   Yes Historical Provider, MD    Family History Family History  Problem Relation Age of Onset  . Heart disease Mother   . Diabetes  Mother   . Stroke Father   . Diabetes Father   . Cancer Brother     lung cancer  . Heart disease Brother     Social History Social History  Substance Use Topics  . Smoking status: Former Smoker    Quit date: 01/19/1973  . Smokeless tobacco: Never Used  . Alcohol use Yes     Comment: occ     Allergies   Patient has no known allergies.   Review of Systems Review of Systems  Constitutional: Negative for chills and fever.  HENT: Negative for congestion and rhinorrhea.   Eyes: Negative for redness and visual disturbance.  Respiratory: Negative for shortness of breath and wheezing.   Cardiovascular: Negative for chest pain and palpitations.  Gastrointestinal: Positive for diarrhea. Negative for nausea and vomiting.  Genitourinary: Negative for dysuria and urgency.  Musculoskeletal: Negative for arthralgias and myalgias.  Skin: Negative for pallor and wound.  Neurological: Negative for dizziness and headaches.     Physical Exam Updated Vital Signs BP (!) 155/68   Pulse 67   Temp 97.9 F (36.6 C) (Oral)   Resp 16   SpO2 93%   Physical Exam  Constitutional: She is oriented to person, place, and time. She appears well-developed and well-nourished. No distress.  HENT:  Head: Normocephalic and atraumatic.  Eyes: EOM are normal. Pupils are equal, round, and reactive to light.  Neck: Normal range of motion. Neck supple.  Cardiovascular: Normal rate and regular rhythm.  Exam reveals no gallop and no friction rub.   No murmur heard. Pulmonary/Chest: Effort normal. She has no wheezes. She has no rales.  Abdominal: Soft. She exhibits no distension and no mass. There is no tenderness. There is no guarding.  Musculoskeletal: She exhibits no edema or tenderness.  Neurological: She is alert and oriented to person, place, and time. She has normal strength. No cranial nerve deficit or sensory deficit. Coordination normal. GCS eye subscore is 4. GCS verbal subscore is 5. GCS motor  subscore is 6. She displays no Babinski's sign on the right side. She displays no Babinski's sign on the left side.  Reflex Scores:      Tricep reflexes are 2+ on the right side and 2+ on the left side.      Bicep reflexes are 2+ on the right side and 2+ on the left side.      Brachioradialis reflexes are 2+ on the right side and 2+ on the left side.      Patellar reflexes are 2+ on the right side and 2+ on the left side.      Achilles reflexes are 2+ on the right side and 2+ on the left side. Skin: Skin is warm and dry. She is not diaphoretic.  Psychiatric: She has a normal mood and affect. Her behavior is normal.  Nursing note and vitals reviewed.  ED Treatments / Results  Labs (all labs ordered are listed, but only abnormal results are displayed) Labs Reviewed  CBC WITH DIFFERENTIAL/PLATELET - Abnormal; Notable for the following:       Result Value   WBC 11.6 (*)    RBC 3.73 (*)    Hemoglobin 10.4 (*)    HCT 32.0 (*)    Platelets 423 (*)    Neutro Abs 9.1 (*)    All other components within normal limits  COMPREHENSIVE METABOLIC PANEL - Abnormal; Notable for the following:    Calcium 8.4 (*)    Total Protein 6.1 (*)    Albumin 2.4 (*)    ALT 13 (*)    All other components within normal limits  C DIFFICILE QUICK SCREEN W PCR REFLEX  LIPASE, BLOOD  MAGNESIUM    EKG  EKG Interpretation  Date/Time:  Wednesday January 06 2017 15:41:51 EDT Ventricular Rate:  69 PR Interval:    QRS Duration: 92 QT Interval:  418 QTC Calculation: 448 R Axis:   61 Text Interpretation:  Sinus rhythm Low voltage, precordial leads No significant change since last tracing Confirmed by Adley Mazurowski MD, Quillian Quince ([id number]) on 01/06/2017 4:21:20 PM       Radiology No results found.  Procedures Procedures (including critical care time)  Medications Ordered in ED Medications  sodium chloride 0.9 % bolus 1,000 mL (0 mLs Intravenous Stopped 01/06/17 1758)  meclizine (ANTIVERT) tablet 25 mg (25 mg Oral  Given 01/06/17 1621)     Initial Impression / Assessment and Plan / ED Course  I have reviewed the triage vital signs and the nursing notes.  Pertinent labs & imaging results that were available during my care of the patient were reviewed by me and considered in my medical decision making (see chart for details).     83 yo F With a chief complaint of diarrhea. This been an ongoing issue. She was initially diagnosed with likely Clostridium difficile versus cancer. Now she's been multiple courses of antibiotics thought to more likely be cancer. She has a flex sig scheduled on Monday. Having worsening diarrhea today and here for evaluation. Appears mildly dehydrated on exam. Will give a liter of fluids.  Discussed with Dr. Marcello Moores, she feels ok with imodium, currently on cipro flagyl, scheduled flex sig at that time.   :  I have discussed the diagnosis/risks/treatment options with the patient and family and believe the pt to be eligible for discharge home to follow-up with [facility]. We also discussed returning to the ED immediately if new or worsening sx occur. We discussed the sx which are most concerning (e.g., sudden worsening pain, fever, inability to tolerate by mouth) that necessitate immediate return. Medications administered to the patient during their visit and any new prescriptions provided to the patient are listed below.  Medications given during this visit Medications  sodium chloride 0.9 % bolus 1,000 mL (0 mLs Intravenous Stopped 01/06/17 1758)  meclizine (ANTIVERT) tablet 25 mg (25 mg Oral Given 01/06/17 1621)     The patient appears reasonably screen and/or stabilized for discharge and I doubt any other medical condition or other [facility] requiring further screening, evaluation, or treatment in the ED at this time prior to discharge.    Final Clinical Impressions(s) / ED Diagnoses   Final diagnoses:  Diarrhea, unspecified type  Dehydration    New Prescriptions Discharge  Medication List as of 01/06/2017  5:36 PM       Deno Etienne, DO 01/06/17 2300

## 2017-01-06 NOTE — ED Triage Notes (Signed)
Pt to ER by [facility] from home for dehydration following 10+ episodes of diarrhea per day worsening over the last 2 months after being diagnosed with diverticulitis approximately 2 months ago. Reports she has been treated with multiple courses of antibiotics without success. Pt reports dizziness this morning and worsening fatigue leading to activating EMS. EMS reports positive orthostatic changes. Was given 400 cc IV fluid in route. Pt is a/o x4 on arrival.

## 2017-01-11 ENCOUNTER — Inpatient Hospital Stay (HOSPITAL_COMMUNITY)
Admission: RE | Admit: 2017-01-11 | Discharge: 2017-01-21 | DRG: 329 | Disposition: A | Discharge status: Skilled Nursing Facility | Payer: Medicare Other | Source: Ambulatory Visit | Attending: Surgery | Admitting: Surgery

## 2017-01-11 ENCOUNTER — Encounter (HOSPITAL_COMMUNITY): Payer: Self-pay

## 2017-01-11 ENCOUNTER — Encounter (HOSPITAL_COMMUNITY): Admission: RE | Disposition: A | Discharge status: Skilled Nursing Facility | Payer: Self-pay | Source: Ambulatory Visit

## 2017-01-11 DIAGNOSIS — Z6823 Body mass index (BMI) 23.0-23.9, adult: Secondary | ICD-10-CM

## 2017-01-11 DIAGNOSIS — C772 Secondary and unspecified malignant neoplasm of intra-abdominal lymph nodes: Secondary | ICD-10-CM | POA: Diagnosis not present

## 2017-01-11 DIAGNOSIS — Z8542 Personal history of malignant neoplasm of other parts of uterus: Secondary | ICD-10-CM | POA: Diagnosis not present

## 2017-01-11 DIAGNOSIS — Z801 Family history of malignant neoplasm of trachea, bronchus and lung: Secondary | ICD-10-CM | POA: Diagnosis not present

## 2017-01-11 DIAGNOSIS — C19 Malignant neoplasm of rectosigmoid junction: Secondary | ICD-10-CM | POA: Diagnosis not present

## 2017-01-11 DIAGNOSIS — E43 Unspecified severe protein-calorie malnutrition: Secondary | ICD-10-CM | POA: Diagnosis present

## 2017-01-11 DIAGNOSIS — N321 Vesicointestinal fistula: Secondary | ICD-10-CM | POA: Diagnosis present

## 2017-01-11 DIAGNOSIS — M6281 Muscle weakness (generalized): Secondary | ICD-10-CM | POA: Diagnosis not present

## 2017-01-11 DIAGNOSIS — Z90710 Acquired absence of both cervix and uterus: Secondary | ICD-10-CM

## 2017-01-11 DIAGNOSIS — K219 Gastro-esophageal reflux disease without esophagitis: Secondary | ICD-10-CM | POA: Diagnosis present

## 2017-01-11 DIAGNOSIS — K633 Ulcer of intestine: Secondary | ICD-10-CM | POA: Diagnosis not present

## 2017-01-11 DIAGNOSIS — K56609 Unspecified intestinal obstruction, unspecified as to partial versus complete obstruction: Secondary | ICD-10-CM | POA: Diagnosis not present

## 2017-01-11 DIAGNOSIS — Z833 Family history of diabetes mellitus: Secondary | ICD-10-CM | POA: Diagnosis not present

## 2017-01-11 DIAGNOSIS — Z823 Family history of stroke: Secondary | ICD-10-CM

## 2017-01-11 DIAGNOSIS — Z87891 Personal history of nicotine dependence: Secondary | ICD-10-CM

## 2017-01-11 DIAGNOSIS — K922 Gastrointestinal hemorrhage, unspecified: Secondary | ICD-10-CM | POA: Diagnosis not present

## 2017-01-11 DIAGNOSIS — F32A Depression, unspecified: Secondary | ICD-10-CM | POA: Diagnosis present

## 2017-01-11 DIAGNOSIS — R1032 Left lower quadrant pain: Secondary | ICD-10-CM | POA: Diagnosis not present

## 2017-01-11 DIAGNOSIS — Z8249 Family history of ischemic heart disease and other diseases of the circulatory system: Secondary | ICD-10-CM | POA: Diagnosis not present

## 2017-01-11 DIAGNOSIS — C7911 Secondary malignant neoplasm of bladder: Secondary | ICD-10-CM | POA: Diagnosis present

## 2017-01-11 DIAGNOSIS — K56699 Other intestinal obstruction unspecified as to partial versus complete obstruction: Secondary | ICD-10-CM | POA: Diagnosis present

## 2017-01-11 DIAGNOSIS — F329 Major depressive disorder, single episode, unspecified: Secondary | ICD-10-CM | POA: Diagnosis present

## 2017-01-11 DIAGNOSIS — D649 Anemia, unspecified: Secondary | ICD-10-CM | POA: Diagnosis present

## 2017-01-11 DIAGNOSIS — R278 Other lack of coordination: Secondary | ICD-10-CM | POA: Diagnosis not present

## 2017-01-11 DIAGNOSIS — K5730 Diverticulosis of large intestine without perforation or abscess without bleeding: Secondary | ICD-10-CM | POA: Diagnosis not present

## 2017-01-11 DIAGNOSIS — R933 Abnormal findings on diagnostic imaging of other parts of digestive tract: Secondary | ICD-10-CM | POA: Diagnosis not present

## 2017-01-11 DIAGNOSIS — K5669 Other partial intestinal obstruction: Secondary | ICD-10-CM | POA: Diagnosis not present

## 2017-01-11 DIAGNOSIS — K5792 Diverticulitis of intestine, part unspecified, without perforation or abscess without bleeding: Secondary | ICD-10-CM | POA: Diagnosis present

## 2017-01-11 DIAGNOSIS — H3530 Unspecified macular degeneration: Secondary | ICD-10-CM | POA: Diagnosis present

## 2017-01-11 DIAGNOSIS — E785 Hyperlipidemia, unspecified: Secondary | ICD-10-CM | POA: Diagnosis present

## 2017-01-11 DIAGNOSIS — R102 Pelvic and perineal pain: Secondary | ICD-10-CM | POA: Diagnosis not present

## 2017-01-11 DIAGNOSIS — K6389 Other specified diseases of intestine: Secondary | ICD-10-CM | POA: Diagnosis not present

## 2017-01-11 DIAGNOSIS — K649 Unspecified hemorrhoids: Secondary | ICD-10-CM | POA: Diagnosis not present

## 2017-01-11 DIAGNOSIS — Z4889 Encounter for other specified surgical aftercare: Secondary | ICD-10-CM | POA: Diagnosis not present

## 2017-01-11 DIAGNOSIS — C187 Malignant neoplasm of sigmoid colon: Secondary | ICD-10-CM | POA: Diagnosis not present

## 2017-01-11 HISTORY — PX: FLEXIBLE SIGMOIDOSCOPY: SHX5431

## 2017-01-11 LAB — COMPREHENSIVE METABOLIC PANEL
ALBUMIN: 3 g/dL — AB (ref 3.5–5.0)
ALK PHOS: 43 U/L (ref 38–126)
ALT: 12 U/L — ABNORMAL LOW (ref 14–54)
ANION GAP: 8 (ref 5–15)
AST: 19 U/L (ref 15–41)
BUN: 14 mg/dL (ref 6–20)
CALCIUM: 8.6 mg/dL — AB (ref 8.9–10.3)
CHLORIDE: 105 mmol/L (ref 101–111)
CO2: 25 mmol/L (ref 22–32)
CREATININE: 0.91 mg/dL (ref 0.44–1.00)
GFR calc Af Amer: >60 mL/min (ref >60)
GFR calc non Af Amer: 56 mL/min — ABNORMAL LOW (ref >60)
GLUCOSE: 88 mg/dL (ref 65–99)
Potassium: 4.7 mmol/L (ref 3.5–5.1)
SODIUM: 138 mmol/L (ref 135–145)
Total Bilirubin: 0.5 mg/dL (ref 0.3–1.2)
Total Protein: 6.7 g/dL (ref 6.5–8.1)

## 2017-01-11 LAB — CBC
HCT: 34.6 % — ABNORMAL LOW (ref 36.0–46.0)
HEMOGLOBIN: 11.2 g/dL — AB (ref 12.0–15.0)
MCH: 28.1 pg (ref 26.0–34.0)
MCHC: 32.4 g/dL (ref 30.0–36.0)
MCV: 86.9 fL (ref 78.0–100.0)
Platelets: 436 10*3/uL — ABNORMAL HIGH (ref 150–400)
RBC: 3.98 MIL/uL (ref 3.87–5.11)
RDW: 14.2 % (ref 11.5–15.5)
WBC: 12.9 10*3/uL — ABNORMAL HIGH (ref 4.0–10.5)

## 2017-01-11 LAB — SURGICAL PCR SCREEN
MRSA, PCR: NEGATIVE
Staphylococcus aureus: NEGATIVE

## 2017-01-11 SURGERY — SIGMOIDOSCOPY, FLEXIBLE
Anesthesia: Moderate Sedation

## 2017-01-11 MED ORDER — MORPHINE SULFATE (PF) 2 MG/ML IV SOLN: 2.0000 mg | INTRAVENOUS | Status: DC | PRN

## 2017-01-11 MED ORDER — SODIUM CHLORIDE 0.9 % IV SOLN
3.0000 g | Freq: Four times a day (QID) | INTRAVENOUS | Status: DC
  Administered 2017-01-11 – 2017-01-14 (×12): 3 g via INTRAVENOUS
  Filled 2017-01-11 (×14): qty 3

## 2017-01-11 MED ORDER — SODIUM CHLORIDE 0.9 % IV SOLN: INTRAVENOUS | Status: DC

## 2017-01-11 MED ORDER — POLYVINYL ALCOHOL 1.4 % OP SOLN
1.0000 [drp] | OPHTHALMIC | Status: DC | PRN
  Filled 2017-01-11: qty 15

## 2017-01-11 MED ORDER — ACETAMINOPHEN 325 MG PO TABS
650.0000 mg | ORAL_TABLET | Freq: Four times a day (QID) | ORAL | Status: DC | PRN
  Administered 2017-01-15 – 2017-01-17 (×3): 650 mg via ORAL
  Filled 2017-01-11 (×3): qty 2

## 2017-01-11 MED ORDER — FENTANYL CITRATE (PF) 100 MCG/2ML IJ SOLN
INTRAMUSCULAR | Status: AC
  Filled 2017-01-11: qty 2

## 2017-01-11 MED ORDER — DIPHENHYDRAMINE HCL 50 MG/ML IJ SOLN
INTRAMUSCULAR | Status: AC
  Filled 2017-01-11: qty 1

## 2017-01-11 MED ORDER — DIPHENHYDRAMINE HCL 50 MG/ML IJ SOLN: 12.5000 mg | Freq: Four times a day (QID) | INTRAMUSCULAR | Status: DC | PRN

## 2017-01-11 MED ORDER — ACETAMINOPHEN 650 MG RE SUPP: 650.0000 mg | Freq: Four times a day (QID) | RECTAL | Status: DC | PRN

## 2017-01-11 MED ORDER — FENTANYL CITRATE (PF) 100 MCG/2ML IJ SOLN
INTRAMUSCULAR | Status: DC | PRN
Start: 2017-01-11 — End: 2017-01-11
  Administered 2017-01-11: 25 ug via INTRAVENOUS

## 2017-01-11 MED ORDER — MIDAZOLAM HCL 5 MG/ML IJ SOLN
INTRAMUSCULAR | Status: AC
  Filled 2017-01-11: qty 2

## 2017-01-11 MED ORDER — MIDAZOLAM HCL 5 MG/5ML IJ SOLN
INTRAMUSCULAR | Status: DC | PRN
  Administered 2017-01-11: 2 mg via INTRAVENOUS

## 2017-01-11 MED ORDER — DIPHENHYDRAMINE HCL 12.5 MG/5ML PO ELIX: 12.5000 mg | ORAL_SOLUTION | Freq: Four times a day (QID) | ORAL | Status: DC | PRN

## 2017-01-11 MED ORDER — BOOST / RESOURCE BREEZE PO LIQD
1.0000 | Freq: Three times a day (TID) | ORAL | Status: DC
  Administered 2017-01-11 – 2017-01-18 (×12): 1 via ORAL

## 2017-01-11 MED ORDER — CARBOXYMETHYLCELLUL-GLYCERIN 0.5-0.9 % OP SOLN: Freq: Every day | OPHTHALMIC | Status: DC | PRN

## 2017-01-11 MED ORDER — BOOST PO LIQD
237.0000 mL | Freq: Every day | ORAL | Status: DC
  Administered 2017-01-12: 237 mL via ORAL
  Filled 2017-01-11: qty 237

## 2017-01-11 MED ORDER — ENOXAPARIN SODIUM 40 MG/0.4ML ~~LOC~~ SOLN
40.0000 mg | SUBCUTANEOUS | Status: DC
  Administered 2017-01-11 – 2017-01-20 (×8): 40 mg via SUBCUTANEOUS
  Filled 2017-01-11 (×7): qty 0.4

## 2017-01-11 MED ORDER — KCL IN DEXTROSE-NACL 20-5-0.45 MEQ/L-%-% IV SOLN
INTRAVENOUS | Status: DC
  Administered 2017-01-11 (×2): via INTRAVENOUS
  Administered 2017-01-12: 100 mL/h via INTRAVENOUS
  Administered 2017-01-13 – 2017-01-14 (×2): via INTRAVENOUS
  Filled 2017-01-11 (×6): qty 1000

## 2017-01-11 MED ORDER — POLYETHYLENE GLYCOL 3350 17 G PO PACK
17.0000 g | PACK | Freq: Two times a day (BID) | ORAL | Status: DC
  Administered 2017-01-11 – 2017-01-12 (×3): 17 g via ORAL
  Filled 2017-01-11 (×5): qty 1

## 2017-01-11 MED ORDER — FAMOTIDINE 10 MG PO TABS
10.0000 mg | ORAL_TABLET | Freq: Two times a day (BID) | ORAL | Status: DC | PRN
  Filled 2017-01-11: qty 1

## 2017-01-11 NOTE — Op Note (Signed)
[facility] Patient Name: Charlene [name] Procedure Date: 01/11/2017 MRN: [id number] Attending MD: Leighton Ruff , MD Date of Birth: 1928-08-15 CSN: [id number] Age: 83 Admit Type: Outpatient Procedure:                Flexible Sigmoidoscopy Indications:              Abdominal pain in the left lower quadrant, Abnormal                            CT of the GI tract, Weight loss Providers:                Leighton Ruff, MD, Dortha Schwalbe RN, RN, Alfonso Patten, Technician, Arnoldo Hooker, CRNA Referring MD:              Medicines:                Fentanyl 25 micrograms IV, Midazolam 2 mg IV Complications:            No immediate complications. Estimated Blood Loss:     Estimated blood loss was minimal. Procedure:                Pre-Anesthesia Assessment:                           - Prior to the procedure, a History and Physical                            was performed, and patient medications and                            allergies were reviewed. The patient's tolerance of                            previous anesthesia was also reviewed. The risks                            and benefits of the procedure and the sedation                            options and risks were discussed with the patient.                            All questions were answered, and informed consent                            was obtained. Prior Anticoagulants: The patient has                            taken no previous anticoagulant or antiplatelet                            agents. ASA Grade Assessment: II - A patient with  mild systemic disease. After reviewing the risks                            and benefits, the patient was deemed in                            satisfactory condition to undergo the procedure.                           - Prior to the procedure, a History and Physical                            was performed, and patient medications,  allergies                            and sensitivities were reviewed. The patient's                            tolerance of previous anesthesia was reviewed.                           - The risks and benefits of the procedure and the                            sedation options and risks were discussed with the                            patient. All questions were answered and informed                            consent was obtained.                           - Patient identification and proposed procedure                            were verified prior to the procedure by the                            physician, the nurse and the technician. The                            procedure was verified in the pre-procedure area in                            the endoscopy suite.                           - The anesthesia plan was to use moderate                            sedation/analgesia (conscious sedation).                           -  Sedation was administered by an endoscopy nurse.                            The sedation level attained was moderate.                           - The heart rate, respiratory rate, oxygen                            saturations, blood pressure, adequacy of pulmonary                            ventilation, and response to care were monitored                            throughout the procedure.                           - The physical status of the patient was                            re-assessed after the procedure.                           After obtaining informed consent, the scope was                            passed under direct vision. The Colonoscope was                            introduced through the anus and advanced to the the                            rectosigmoid junction. The [id number] ([id number])                            scope was introduced through the and advanced to                            the. Scope In: 8:34:01 AM Scope Out: 8:41:42  AM Total Procedure Duration: 0 hours 7 minutes 41 seconds  Findings:      The perianal and digital rectal examinations were normal. Pertinent       negatives include normal sphincter tone.      An extrinsic moderate stenosis measuring 1.5 cm (inner diameter) was       found in the recto-sigmoid colon and was non-traversed. Impression:               - No specimens collected.                           - Congested, erythematous and friable (with contact                            bleeding) mucosa in the recto-sigmoid colon.  Biopsied. Moderate Sedation:      Moderate (conscious) sedation was administered by the endoscopy nurse       and supervised by the endoscopist. The following parameters were       monitored: oxygen saturation, heart rate, blood pressure, and response       to care. Recommendation:           - Await pathology results.                           - Low fiber diet daily. Procedure Code(s):        --- Professional ---                           [phone], Sigmoidoscopy, flexible; diagnostic,                            including collection of specimen(s) by brushing or                            washing, when performed (separate procedure) Diagnosis Code(s):        --- Professional ---                           K63.89, Other specified diseases of intestine                           K92.2, Gastrointestinal hemorrhage, unspecified                           R10.32, Left lower quadrant pain                           R63.4, Abnormal weight loss                           R93.3, Abnormal findings on diagnostic imaging of                            other parts of digestive tract CPT copyright 2016 American Medical Association. All rights reserved. The codes documented in this report are preliminary and upon coder review may  be revised to meet current compliance requirements. Leighton Ruff, MD Leighton Ruff, MD 0/12/5463 8:50:56 AM This report has been  signed electronically. Number of Addenda: 0

## 2017-01-11 NOTE — H&P (Signed)
Charlene [name] is an 83 y.o. female.   Chief Complaint: persistent abdominal pain, abnormal CT findings HPI: 83 y.o. F with worsening pelvic pain.  CT scan was obtained ~2 months ago and an inflammatory mass was found in the pelvis.  This was thought to be due to diverticulitis but it did not resolve with antibiotics on repeat CT scan.  We are here today to eval for cancer.    Past Medical History:  Diagnosis Date  . Acute renal failure ([place])   . Allergy    seasonal  . BPPV (benign paroxysmal positional vertigo) 2012  . Depression   . Diverticulitis   . Dyslipidemia   . GERD (gastroesophageal reflux disease)   . Hemorrhoids   . Macular degeneration   . Urine frequency   . Uterine carcinoma [facility])     Past Surgical History:  Procedure Laterality Date  . ABDOMINAL HYSTERECTOMY    . EYE SURGERY     cataract removal bilateral 2012  . TONSILLECTOMY      Family History  Problem Relation Age of Onset  . Heart disease Mother   . Diabetes Mother   . Stroke Father   . Diabetes Father   . Cancer Brother     lung cancer  . Heart disease Brother    Social History:  reports that she quit smoking about 44 years ago. She has never used smokeless tobacco. She reports that she drinks alcohol. She reports that she does not use drugs.  Allergies: No Known Allergies  Medications Prior to Admission  Medication Sig Dispense Refill  . bismuth subsalicylate (PEPTO BISMOL) 262 MG/15ML suspension Take 30 mLs by mouth every 6 (six) hours as needed for indigestion or diarrhea or loose stools.    . Carboxymethylcellul-Glycerin (LUBRICATING EYE DROPS OP) Apply 1 drop to eye daily as needed (dry eyes).    . famotidine (PEPCID AC) 10 MG chewable tablet Chew 10 mg by mouth 2 (two) times daily as needed for heartburn.    . lactose free nutrition (BOOST) LIQD Take 237 mLs by mouth daily.    . Multiple Vitamins-Minerals (MULTIVITAMIN ADULT PO) Take 1 tablet by mouth daily.     . Multiple Vitamins-Minerals  (PRESERVISION AREDS 2) CAPS Take 1 capsule by mouth daily.    . traMADol (ULTRAM) 50 MG tablet Take 50 mg by mouth 2 (two) times daily as needed for moderate pain.    Marland Kitchen triamcinolone (NASACORT AQ) 55 MCG/ACT AERO nasal inhaler Place 2 sprays into the nose daily as needed (for allergies).    Marland Kitchen acetaminophen (TYLENOL) 500 MG tablet Take 500 mg by mouth every 6 (six) hours as needed for moderate pain.     . ciprofloxacin (CIPRO) 500 MG tablet Take 500 mg by mouth 2 (two) times daily.    . metroNIDAZOLE (FLAGYL) 500 MG tablet Take 500 mg by mouth 3 (three) times daily.      No results found for this or any previous visit (from the past 48 hour(s)). No results found.  Review of Systems  Constitutional: Negative for chills and fever.  HENT: Negative for hearing loss.   Eyes: Negative for blurred vision.  Respiratory: Negative for cough and shortness of breath.   Cardiovascular: Negative for chest pain and palpitations.  Gastrointestinal: Positive for abdominal pain and diarrhea. Negative for nausea and vomiting.  Genitourinary: Negative for dysuria, frequency and urgency.  Skin: Negative for itching and rash.  Neurological: Negative for dizziness and headaches.    There were  no vitals taken for this visit. Physical Exam  Constitutional: She is oriented to person, place, and time. She appears well-developed and well-nourished.  HENT:  Head: Normocephalic and atraumatic.  Eyes: Conjunctivae and EOM are normal. Pupils are equal, round, and reactive to light.  Neck: Neck supple.  Cardiovascular: Normal rate and regular rhythm.   Respiratory: Effort normal. No respiratory distress.  GI: Soft. She exhibits no distension. There is tenderness (Suprapubic).  Neurological: She is alert and oriented to person, place, and time.  Skin: Skin is warm and dry.     Assessment/Plan 83 y.o. F with sigmoid inflammation that is not improving.  Worrisome for cancer.  We have decided to evaluate with a flex  sig for possible tumor.  She understands that she is at a high risk for perforation than normal and a perforation may result in emergent surgery and colostomy.  Unfortunately, she is not improving at home and we need to get a diagnosis to move forward with further treatment.    Rosario Adie., MD 12/05/231, 8:09 AM

## 2017-01-12 ENCOUNTER — Encounter (HOSPITAL_COMMUNITY): Payer: Self-pay | Admitting: General Surgery

## 2017-01-12 NOTE — Progress Notes (Signed)
[facility] Progress Note  1 Day Post-Op  Subjective: CC: recurrent diverticulitis  Pt denies abdominal or rectal pain. Tolerating clears. Having flatus and passing small amounts of loose stool with flatus. Denies hematochezia. Denies fever/chills. Mobilizing.   Objective: Vital signs in last 24 hours: Temp:  [97.7 F (36.5 C)-98.7 F (37.1 C)] 98.1 F (36.7 C) (04/10 0542) Pulse Rate:  [53-69] 53 (04/10 0542) Resp:  [13-24] 18 (04/10 0542) BP: (112-168)/(43-64) 133/43 (04/10 0542) SpO2:  [94 %-100 %] 96 % (04/10 0542) Weight:  [60.8 kg (134 lb)] 60.8 kg (134 lb) (04/09 0817) Last BM Date: 01/12/17  Intake/Output from previous day: 04/09 0701 - 04/10 0700 In: 2976.7 [P.O.:960; I.V.:1916.7; IV Piggyback:100] Out: 1000 [Urine:1000] Intake/Output this shift: No intake/output data recorded.  PE: Gen:  Alert, NAD, pleasant Card:  Regular rate and rhythm Pulm:  Normal respiratory effort, clear to auscultation bilaterally Abd: Soft, non-tender, mild distention, bowel sounds present in all 4 quadrants, previous laparotomy scar noted. Ext:  No erythema, edema, or tenderness  Lab Results:   Recent Labs  01/11/17 1033  WBC 12.9*  HGB 11.2*  HCT 34.6*  PLT 436*   BMET  Recent Labs  01/11/17 1033  NA 138  K 4.7  CL 105  CO2 25  GLUCOSE 88  BUN 14  CREATININE 0.91  CALCIUM 8.6*   PT/INR No results for input(s): LABPROT, INR in the last 72 hours. CMP     Component Value Date/Time   NA 138 01/11/2017 1033   K 4.7 01/11/2017 1033   CL 105 01/11/2017 1033   CO2 25 01/11/2017 1033   GLUCOSE 88 01/11/2017 1033   BUN 14 01/11/2017 1033   CREATININE 0.91 01/11/2017 1033   CALCIUM 8.6 (L) 01/11/2017 1033   PROT 6.7 01/11/2017 1033   ALBUMIN 3.0 (L) 01/11/2017 1033   AST 19 01/11/2017 1033   ALT 12 (L) 01/11/2017 1033   ALKPHOS 43 01/11/2017 1033   BILITOT 0.5 01/11/2017 1033   GFRNONAA 56 (L) 01/11/2017 1033   GFRAA >60 01/11/2017 1033   Lipase      Component Value Date/Time   LIPASE 18 01/06/2017 1526   Anti-infectives: Anti-infectives    Start     Dose/Rate Route Frequency Ordered Stop   01/11/17 1200  Ampicillin-Sulbactam (UNASYN) 3 g in sodium chloride 0.9 % 100 mL IVPB     3 g 200 mL/hr over 30 Minutes Intravenous Every 6 hours 01/11/17 0926         Assessment/Plan Sigmoid inflammation - diverticulitis vs cancer  - initially thought 2/2 acute diverticulitis, CT scan did not improve s/p abx treatment  - FLEXIBLE SIGMOIDOSCOPY 01/11/17: Congested, erythematous and friable (with contact bleeding) mucosa in the recto-sigmoid colon. Biopsied and path pending   FEN: clear liquids ID: UNASYN 4/9 >> VTE: SCD's, Lovenox   Plan: await path; clear liquid diet and slow bowel prep with Miralax.  Pt will likely require surgery this admission.    LOS: 1 day    [facility] 01/12/2017, 7:30 AM Pager: [phone] Consults: [phone] Mon-Fri 7:00 am-4:30 pm Sat-Sun 7:00 am-11:30 am

## 2017-01-12 NOTE — Progress Notes (Signed)
Initial Nutrition Assessment  DOCUMENTATION CODES:   Severe malnutrition in context of acute illness/injury  INTERVENTION:   Continue Boost Breeze po TID, each supplement provides 250 kcal and 9 grams of protein D/c Boost supplement per pt request Encourage PO of liquids as tolerated  RD to continue to monitor  NUTRITION DIAGNOSIS:   Malnutrition (Severe) related to acute illness (colonic stricture) as evidenced by percent weight loss, severe depletion of body fat, severe depletion of muscle mass.  GOAL:   Patient will meet greater than or equal to 90% of their needs  MONITOR:   PO intake, Supplement acceptance, Labs, Weight trends, I & O's  REASON FOR ASSESSMENT:   Malnutrition Screening Tool    ASSESSMENT:   83 y.o. F with worsening pelvic pain.  CT scan was obtained ~2 months ago and an inflammatory mass was found in the pelvis.  This was thought to be due to diverticulitis but it did not resolve with antibiotics on repeat CT scan.   Patient in room with no family at bedside. Pt with clear liquid tray in front of her. States she had some beef broth and ginger ale mostly. She is also drinking the Boost Breeze supplements. Does not like the Boost Original supplements provided. Will d/c order. Encouraged pt to drink as much as possible of the Boost Breeze.  Pt to have surgery either 4/11 or 4/12. Once diet advanced past clears after surgery will order Boost Plus for patient to try.  Pt states she has not been eating well for the past 2 months d/t diverticulitis.   Pt states her UBW is 148 lb. She has lost 14 lb since 2 months ago, 9% wt loss x 2 months, significant for time frame. Nutrition-Focused physical exam completed. Findings are severe fat depletion, severe muscle depletion, and no edema.   Medications: Miralax packet BID, D5 and .45% NaCl w/ KCl infusion at 50 ml/hr -provides 204 kcal Labs reviewed: GFR: 56  Diet Order:  Diet clear liquid Room service appropriate?  Yes  Skin:  Reviewed, no issues  Last BM:  4/10  Height:   Ht Readings from Last 1 Encounters:  01/11/17 5\' 4"  (1.626 m)    Weight:   Wt Readings from Last 1 Encounters:  01/11/17 134 lb (60.8 kg)    Ideal Body Weight:  54.5 kg  BMI:  Body mass index is 23 kg/m.  Estimated Nutritional Needs:   Kcal:  1600-1800  Protein:  70-80g  Fluid:  1.8L/day  EDUCATION NEEDS:   Education needs addressed  Clayton Bibles, MS, RD, LDN Pager: [phone] After Hours Pager: [phone]

## 2017-01-13 NOTE — Progress Notes (Signed)
Pt c/o of abdominal discomfort and having to force out gas. Pt has walked 320 with some relief but still uncomfortable. MD page and made aware pt would like medication to provide comfort. RN will continue to monitor and encourage more walking

## 2017-01-13 NOTE — Progress Notes (Signed)
[facility] Progress Note  2 Days Post-Op  Subjective: CC: recurrent diverticulitis  Denies abdominal pain. Reports she is having flatus but has to strain in order to pass gas. Tolerating clears. Anxious about surgery. All questions answered. Pathology discussed.  Objective: Vital signs in last 24 hours: Temp:  [98 F (36.7 C)-98.6 F (37 C)] 98 F (36.7 C) (04/11 0540) Pulse Rate:  [56-66] 56 (04/11 0540) Resp:  [18] 18 (04/11 0540) BP: (122-129)/(46-62) 127/46 (04/11 0540) SpO2:  [96 %-98 %] 98 % (04/11 0540) Last BM Date: 01/12/17  Intake/Output from previous day: 04/10 0701 - 04/11 0700 In: 3150 [P.O.:960; I.V.:1490; IV Piggyback:700] Out: -  Intake/Output this shift: No intake/output data recorded.  PE: Gen:  Alert, NAD, pleasant Card:  Regular rate and rhythm Pulm:  Normal respiratory effort, clear to auscultation bilaterally Abd: Soft, non-tender, mild distention, bowel sounds present in all 4 quadrants, previous laparotomy scar noted. Ext:  No erythema, edema, or tenderness  Lab Results:   Recent Labs  01/11/17 1033  WBC 12.9*  HGB 11.2*  HCT 34.6*  PLT 436*   BMET  Recent Labs  01/11/17 1033  NA 138  K 4.7  CL 105  CO2 25  GLUCOSE 88  BUN 14  CREATININE 0.91  CALCIUM 8.6*   CMP     Component Value Date/Time   NA 138 01/11/2017 1033   K 4.7 01/11/2017 1033   CL 105 01/11/2017 1033   CO2 25 01/11/2017 1033   GLUCOSE 88 01/11/2017 1033   BUN 14 01/11/2017 1033   CREATININE 0.91 01/11/2017 1033   CALCIUM 8.6 (L) 01/11/2017 1033   PROT 6.7 01/11/2017 1033   ALBUMIN 3.0 (L) 01/11/2017 1033   AST 19 01/11/2017 1033   ALT 12 (L) 01/11/2017 1033   ALKPHOS 43 01/11/2017 1033   BILITOT 0.5 01/11/2017 1033   GFRNONAA 56 (L) 01/11/2017 1033   GFRAA >60 01/11/2017 1033   Lipase     Component Value Date/Time   LIPASE 18 01/06/2017 1526    Anti-infectives: Anti-infectives    Start     Dose/Rate Route Frequency Ordered Stop   01/11/17 1200  Ampicillin-Sulbactam (UNASYN) 3 g in sodium chloride 0.9 % 100 mL IVPB     3 g 200 mL/hr over 30 Minutes Intravenous Every 6 hours 01/11/17 0926       Assessment/Plan Sigmoid stricture  - FLEXIBLE SIGMOIDOSCOPY 01/11/17: Congested, erythematous and friable (with contact bleeding) mucosa in the recto-sigmoid colon. Biopsied - path negative for malignancy  - continue slow bowel prep   FEN: clear liquids ID: UNASYN 4/9 >> VTE: SCD's, Lovenox   Plan: clear liquid diet and slow bowel prep with Miralax.  To OR tomorrow with Dr. Marcello Moores for sigmoidectomy    LOS: 2 days    Jill Alexanders , [facility] Surgery 01/13/2017, 7:31 AM Pager: [phone] Consults: [phone] Mon-Fri 7:00 am-4:30 pm Sat-Sun 7:00 am-11:30 am

## 2017-01-14 ENCOUNTER — Encounter (HOSPITAL_COMMUNITY): Payer: Self-pay | Admitting: Certified Registered Nurse Anesthetist

## 2017-01-14 ENCOUNTER — Encounter (HOSPITAL_COMMUNITY): Admission: RE | Disposition: A | Discharge status: Skilled Nursing Facility | Payer: Self-pay | Source: Ambulatory Visit

## 2017-01-14 ENCOUNTER — Inpatient Hospital Stay (HOSPITAL_COMMUNITY): Payer: Medicare Other | Admitting: Certified Registered Nurse Anesthetist

## 2017-01-14 HISTORY — PX: COLECTOMY: SHX59

## 2017-01-14 LAB — TYPE AND SCREEN
ABO/RH(D): B POS
ANTIBODY SCREEN: NEGATIVE

## 2017-01-14 LAB — ABO/RH: ABO/RH(D): B POS

## 2017-01-14 SURGERY — COLECTOMY, TOTAL
Anesthesia: General | Site: Abdomen

## 2017-01-14 MED ORDER — ESMOLOL HCL 100 MG/10ML IV SOLN
INTRAVENOUS | Status: DC | PRN
  Administered 2017-01-14: 20 mg via INTRAVENOUS

## 2017-01-14 MED ORDER — FENTANYL CITRATE (PF) 250 MCG/5ML IJ SOLN
INTRAMUSCULAR | Status: AC
  Filled 2017-01-14: qty 5

## 2017-01-14 MED ORDER — ESMOLOL HCL 100 MG/10ML IV SOLN
INTRAVENOUS | Status: AC
  Filled 2017-01-14: qty 10

## 2017-01-14 MED ORDER — SUGAMMADEX SODIUM 200 MG/2ML IV SOLN
INTRAVENOUS | Status: AC
  Filled 2017-01-14: qty 2

## 2017-01-14 MED ORDER — ROCURONIUM BROMIDE 50 MG/5ML IV SOSY
PREFILLED_SYRINGE | INTRAVENOUS | Status: DC | PRN
  Administered 2017-01-14: 10 mg via INTRAVENOUS
  Administered 2017-01-14: 20 mg via INTRAVENOUS
  Administered 2017-01-14: 40 mg via INTRAVENOUS
  Administered 2017-01-14: 10 mg via INTRAVENOUS

## 2017-01-14 MED ORDER — KCL IN DEXTROSE-NACL 20-5-0.45 MEQ/L-%-% IV SOLN
INTRAVENOUS | Status: DC
  Administered 2017-01-14: 17:00:00 via INTRAVENOUS
  Administered 2017-01-15 (×2): 1000 mL via INTRAVENOUS
  Administered 2017-01-16 – 2017-01-17 (×2): via INTRAVENOUS
  Administered 2017-01-18: 50 mL/h via INTRAVENOUS
  Filled 2017-01-14 (×8): qty 1000

## 2017-01-14 MED ORDER — DEXAMETHASONE SODIUM PHOSPHATE 10 MG/ML IJ SOLN
INTRAMUSCULAR | Status: AC
  Filled 2017-01-14: qty 1

## 2017-01-14 MED ORDER — DEXAMETHASONE SODIUM PHOSPHATE 10 MG/ML IJ SOLN
INTRAMUSCULAR | Status: DC | PRN
  Administered 2017-01-14: 10 mg via INTRAVENOUS

## 2017-01-14 MED ORDER — FENTANYL CITRATE (PF) 100 MCG/2ML IJ SOLN
25.0000 ug | INTRAMUSCULAR | Status: DC | PRN
  Administered 2017-01-14 (×2): 50 ug via INTRAVENOUS

## 2017-01-14 MED ORDER — FENTANYL CITRATE (PF) 100 MCG/2ML IJ SOLN
INTRAMUSCULAR | Status: AC
  Filled 2017-01-14: qty 2

## 2017-01-14 MED ORDER — ROCURONIUM BROMIDE 50 MG/5ML IV SOSY
PREFILLED_SYRINGE | INTRAVENOUS | Status: AC
  Filled 2017-01-14: qty 5

## 2017-01-14 MED ORDER — LIDOCAINE 2% (20 MG/ML) 5 ML SYRINGE
INTRAMUSCULAR | Status: DC | PRN
  Administered 2017-01-14: 100 mg via INTRAVENOUS

## 2017-01-14 MED ORDER — LACTATED RINGERS IV SOLN
INTRAVENOUS | Status: DC
  Administered 2017-01-14: 1000 mL via INTRAVENOUS

## 2017-01-14 MED ORDER — 0.9 % SODIUM CHLORIDE (POUR BTL) OPTIME
TOPICAL | Status: DC | PRN
  Administered 2017-01-14: 2000 mL

## 2017-01-14 MED ORDER — EPHEDRINE 5 MG/ML INJ
INTRAVENOUS | Status: AC
  Filled 2017-01-14: qty 10

## 2017-01-14 MED ORDER — LIDOCAINE 2% (20 MG/ML) 5 ML SYRINGE
INTRAMUSCULAR | Status: AC
  Filled 2017-01-14: qty 5

## 2017-01-14 MED ORDER — PROPOFOL 10 MG/ML IV BOLUS
INTRAVENOUS | Status: AC
  Filled 2017-01-14: qty 20

## 2017-01-14 MED ORDER — MORPHINE SULFATE (PF) 10 MG/ML IV SOLN
2.0000 mg | INTRAVENOUS | Status: DC | PRN
  Administered 2017-01-14 – 2017-01-15 (×4): 2 mg via INTRAVENOUS
  Filled 2017-01-14 (×4): qty 1

## 2017-01-14 MED ORDER — MEPERIDINE HCL 50 MG/ML IJ SOLN: 6.2500 mg | INTRAMUSCULAR | Status: DC | PRN

## 2017-01-14 MED ORDER — LACTATED RINGERS IV SOLN
INTRAVENOUS | Status: DC | PRN
  Administered 2017-01-14 (×3): via INTRAVENOUS

## 2017-01-14 MED ORDER — STERILE WATER FOR IRRIGATION IR SOLN
Status: DC | PRN
  Administered 2017-01-14: 450 mL via INTRAVESICAL

## 2017-01-14 MED ORDER — SUGAMMADEX SODIUM 200 MG/2ML IV SOLN
INTRAVENOUS | Status: DC | PRN
  Administered 2017-01-14: 120 mg via INTRAVENOUS

## 2017-01-14 MED ORDER — ONDANSETRON HCL 4 MG/2ML IJ SOLN
INTRAMUSCULAR | Status: DC | PRN
  Administered 2017-01-14: 4 mg via INTRAVENOUS

## 2017-01-14 MED ORDER — SUCCINYLCHOLINE CHLORIDE 200 MG/10ML IV SOSY
PREFILLED_SYRINGE | INTRAVENOUS | Status: DC | PRN
  Administered 2017-01-14: 100 mg via INTRAVENOUS

## 2017-01-14 MED ORDER — ONDANSETRON HCL 4 MG/2ML IJ SOLN
INTRAMUSCULAR | Status: AC
  Filled 2017-01-14: qty 2

## 2017-01-14 MED ORDER — FENTANYL CITRATE (PF) 100 MCG/2ML IJ SOLN
INTRAMUSCULAR | Status: DC | PRN
Start: 2017-01-14 — End: 2017-01-14
  Administered 2017-01-14 (×7): 50 ug via INTRAVENOUS

## 2017-01-14 MED ORDER — PROMETHAZINE HCL 25 MG/ML IJ SOLN: 6.2500 mg | INTRAMUSCULAR | Status: DC | PRN

## 2017-01-14 MED ORDER — SUCCINYLCHOLINE CHLORIDE 200 MG/10ML IV SOSY
PREFILLED_SYRINGE | INTRAVENOUS | Status: AC
  Filled 2017-01-14: qty 10

## 2017-01-14 MED ORDER — EPHEDRINE SULFATE 50 MG/ML IJ SOLN
INTRAMUSCULAR | Status: DC | PRN
  Administered 2017-01-14 (×3): 10 mg via INTRAVENOUS

## 2017-01-14 MED ORDER — LABETALOL HCL 5 MG/ML IV SOLN
INTRAVENOUS | Status: DC | PRN
  Administered 2017-01-14 (×4): 2.5 mg via INTRAVENOUS

## 2017-01-14 MED ORDER — METHYLENE BLUE 0.5 % INJ SOLN
INTRAVENOUS | Status: AC
  Filled 2017-01-14: qty 10

## 2017-01-14 MED ORDER — PROPOFOL 10 MG/ML IV BOLUS
INTRAVENOUS | Status: DC | PRN
  Administered 2017-01-14: 100 mg via INTRAVENOUS
  Administered 2017-01-14: 50 mg via INTRAVENOUS

## 2017-01-14 SURGICAL SUPPLY — 61 items
ADH SKN CLS APL DERMABOND .7 (GAUZE/BANDAGES/DRESSINGS) ×1
BLADE EXTENDED COATED 6.5IN (ELECTRODE) IMPLANT
CELLS DAT CNTRL 66122 CELL SVR (MISCELLANEOUS) IMPLANT
CHLORAPREP W/TINT 26ML (MISCELLANEOUS) IMPLANT
COUNTER NEEDLE 20 DBL MAG RED (NEEDLE) ×3 IMPLANT
COVER MAYO STAND STRL (DRAPES) ×9 IMPLANT
DECANTER SPIKE VIAL GLASS SM (MISCELLANEOUS) ×3 IMPLANT
DERMABOND ADVANCED (GAUZE/BANDAGES/DRESSINGS) ×2
DERMABOND ADVANCED .7 DNX12 (GAUZE/BANDAGES/DRESSINGS) ×1 IMPLANT
DRAIN CHANNEL 19F RND (DRAIN) IMPLANT
DRAPE LAPAROSCOPIC ABDOMINAL (DRAPES) ×3 IMPLANT
DRSG OPSITE POSTOP 4X10 (GAUZE/BANDAGES/DRESSINGS) ×3 IMPLANT
DRSG OPSITE POSTOP 4X6 (GAUZE/BANDAGES/DRESSINGS) IMPLANT
DRSG OPSITE POSTOP 4X8 (GAUZE/BANDAGES/DRESSINGS) ×3 IMPLANT
ELECT PENCIL ROCKER SW 15FT (MISCELLANEOUS) ×6 IMPLANT
ELECT REM PT RETURN 15FT ADLT (MISCELLANEOUS) ×3 IMPLANT
EVACUATOR SILICONE 100CC (DRAIN) IMPLANT
GAUZE SPONGE 4X4 12PLY STRL (GAUZE/BANDAGES/DRESSINGS) IMPLANT
GLOVE BIO SURGEON STRL SZ 6.5 (GLOVE) ×4 IMPLANT
GLOVE BIO SURGEONS STRL SZ 6.5 (GLOVE) ×2
GLOVE BIOGEL PI IND STRL 7.0 (GLOVE) ×2 IMPLANT
GLOVE BIOGEL PI INDICATOR 7.0 (GLOVE) ×4
GOWN STRL REUS W/TWL 2XL LVL3 (GOWN DISPOSABLE) ×6 IMPLANT
GOWN STRL REUS W/TWL XL LVL3 (GOWN DISPOSABLE) ×12 IMPLANT
LEGGING LITHOTOMY PAIR STRL (DRAPES) IMPLANT
LIGASURE IMPACT 36 18CM CVD LR (INSTRUMENTS) ×3 IMPLANT
LUBRICANT JELLY K Y 4OZ (MISCELLANEOUS) ×3 IMPLANT
NDL SAFETY ECLIPSE 18X1.5 (NEEDLE) ×1 IMPLANT
NEEDLE HYPO 18GX1.5 SHARP (NEEDLE) ×3
PACK COLON (CUSTOM PROCEDURE TRAY) ×3 IMPLANT
PAD POSITIONING PINK XL (MISCELLANEOUS) ×3 IMPLANT
POSITIONER SURGICAL ARM (MISCELLANEOUS) ×3 IMPLANT
RTRCTR WOUND ALEXIS 18CM MED (MISCELLANEOUS)
SEALER TISSUE G2 CVD JAW 35 (ENDOMECHANICALS) IMPLANT
SEALER TISSUE G2 CVD JAW 45CM (ENDOMECHANICALS)
SEALER TISSUE X1 CVD JAW (INSTRUMENTS) IMPLANT
SET IRRIG Y TYPE TUR BLADDER L (SET/KITS/TRAYS/PACK) ×3 IMPLANT
SPONGE DRAIN TRACH 4X4 STRL 2S (GAUZE/BANDAGES/DRESSINGS) ×3 IMPLANT
SPONGE LAP 18X18 X RAY DECT (DISPOSABLE) ×3 IMPLANT
STAPLER CIRC CVD 29MM 37CM (STAPLE) ×3 IMPLANT
STAPLER CUT CVD 40MM GREEN (STAPLE) ×3 IMPLANT
STAPLER PROXIMATE 75MM BLUE (STAPLE) ×3 IMPLANT
STAPLER VISISTAT 35W (STAPLE) ×3 IMPLANT
SUT ETHILON 2 0 PS N (SUTURE) IMPLANT
SUT NOVA NAB GS-21 0 18 T12 DT (SUTURE) ×6 IMPLANT
SUT PDS AB 1 CTX 36 (SUTURE) IMPLANT
SUT PDS AB 1 TP1 96 (SUTURE) IMPLANT
SUT PROLENE 2 0 KS (SUTURE) ×6 IMPLANT
SUT SILK 2 0 (SUTURE) ×3
SUT SILK 2 0 SH CR/8 (SUTURE) ×3 IMPLANT
SUT SILK 2-0 18XBRD TIE 12 (SUTURE) ×1 IMPLANT
SUT SILK 3 0 (SUTURE) ×3
SUT SILK 3 0 SH CR/8 (SUTURE) ×3 IMPLANT
SUT SILK 3-0 18XBRD TIE 12 (SUTURE) ×1 IMPLANT
SUT VIC AB 2-0 SH 18 (SUTURE) ×3 IMPLANT
SUT VIC AB 4-0 PS2 27 (SUTURE) ×3 IMPLANT
SYR 20CC LL (SYRINGE) ×3 IMPLANT
TOWEL OR NON WOVEN STRL DISP B (DISPOSABLE) ×3 IMPLANT
TRAY FOLEY W/METER SILVER 16FR (SET/KITS/TRAYS/PACK) IMPLANT
TUBING CONNECTING 10 (TUBING) ×2 IMPLANT
TUBING CONNECTING 10' (TUBING) ×1

## 2017-01-14 NOTE — Transfer of Care (Signed)
Immediate Anesthesia Transfer of Care Note  Patient: Charlene [name]  Procedure(s) Performed: Procedure(s): OPEN SIGMOIDECTOMY WITH REPAIR OF COLOVESICULAR FISTULA (N/A)  Patient Location: PACU  Anesthesia Type:General  Level of Consciousness:  sedated, patient cooperative and responds to stimulation  Airway & Oxygen Therapy:Patient Spontanous Breathing and Patient connected to face mask oxgen  Post-op Assessment:  Report given to PACU RN and Post -op Vital signs reviewed and stable  Post vital signs:  Reviewed and stable  Last Vitals:  Vitals:   01/14/17 0537 01/14/17 0830  BP: (!) 134/49 118/75  Pulse: 62 (!) 57  Resp: 18 18  Temp: 36.7 C 38.1 C    Complications: No apparent anesthesia complications

## 2017-01-14 NOTE — Anesthesia Postprocedure Evaluation (Signed)
Anesthesia Post Note  Patient: Charlene [name]  Procedure(s) Performed: Procedure(s) (LRB): OPEN SIGMOIDECTOMY WITH REPAIR OF COLOVESICULAR FISTULA (N/A)  Patient location during evaluation: PACU Anesthesia Type: General Level of consciousness: awake and alert and oriented Pain management: pain level controlled Vital Signs Assessment: post-procedure vital signs reviewed and stable Respiratory status: spontaneous breathing, nonlabored ventilation and respiratory function stable Cardiovascular status: blood pressure returned to baseline and stable Postop Assessment: no signs of nausea or vomiting Anesthetic complications: no       Last Vitals:  Vitals:   01/14/17 1430 01/14/17 1451  BP: (!) 126/53 (!) 148/53  Pulse: 61   Resp: 14 14  Temp: 36.2 C 36.6 C    Last Pain:  Vitals:   01/14/17 1448  TempSrc:   PainSc: Asleep                 Zhane Donlan A.

## 2017-01-14 NOTE — Anesthesia Preprocedure Evaluation (Addendum)
Anesthesia Evaluation  Patient identified by MRN, date of birth, ID band Patient awake    Reviewed: Allergy & Precautions, NPO status , Patient's Chart, lab work & pertinent test results  Airway Mallampati: II  TM Distance: >3 FB Neck ROM: Full    Dental no notable dental hx. (+) Teeth Intact, Caps   Pulmonary former smoker,    Pulmonary exam normal breath sounds clear to auscultation       Cardiovascular Normal cardiovascular exam Rhythm:Regular Rate:Normal  BPPV   Neuro/Psych PSYCHIATRIC DISORDERS Depression negative neurological ROS     GI/Hepatic Neg liver ROS, GERD  Medicated and Controlled,Diverticulitis   Endo/Other  Hyperlipidemia  Renal/GU ARFRenal diseaseHx/o ARF 2 years ago, normal renal function currently  negative genitourinary   Musculoskeletal negative musculoskeletal ROS (+)   Abdominal   Peds  Hematology  (+) anemia ,   Anesthesia Other Findings   Reproductive/Obstetrics Uterine Ca                            Anesthesia Physical Anesthesia Plan  ASA: III  Anesthesia Plan: General   Post-op Pain Management:    Induction: Intravenous  Airway Management Planned: Oral ETT  Additional Equipment:   Intra-op Plan:   Post-operative Plan: Extubation in OR  Informed Consent: I have reviewed the patients History and Physical, chart, labs and discussed the procedure including the risks, benefits and alternatives for the proposed anesthesia with the patient or authorized representative who has indicated his/her understanding and acceptance.   Dental advisory given  Plan Discussed with: CRNA, Anesthesiologist and Surgeon  Anesthesia Plan Comments:        Anesthesia Quick Evaluation

## 2017-01-14 NOTE — Anesthesia Procedure Notes (Signed)
Procedure Name: Intubation Date/Time: 01/14/2017 10:40 AM Performed by: Maxwell Caul Pre-anesthesia Checklist: Patient identified, Emergency Drugs available, Suction available and Patient being monitored Patient Re-evaluated:Patient Re-evaluated prior to inductionOxygen Delivery Method: Circle system utilized Preoxygenation: Pre-oxygenation with 100% oxygen Intubation Type: IV induction, Rapid sequence and Cricoid Pressure applied Laryngoscope Size: Mac and 4 Grade View: Grade I Tube type: Oral Tube size: 7.0 mm Number of attempts: 1 Airway Equipment and Method: Stylet Placement Confirmation: ETT inserted through vocal cords under direct vision,  positive ETCO2 and breath sounds checked- equal and bilateral Secured at: 21 cm Tube secured with: Tape Dental Injury: Teeth and Oropharynx as per pre-operative assessment

## 2017-01-14 NOTE — Op Note (Signed)
01/11/2017 - 01/14/2017  2:42 PM  PATIENT:  Charlene [name]  83 y.o. female  Patient Care Team: Shon Baton, MD as PCP - General (Internal Medicine)  PRE-OPERATIVE DIAGNOSIS:  DIVERTICULITIS  POST-OPERATIVE DIAGNOSIS:  DIVERTICULITIS, COLO-VESICULAR FISTULA  PROCEDURE:  Procedure(s): OPEN SIGMOIDECTOMY WITH REPAIR OF COLOVESICULAR FISTULA  SURGEON:  Surgeon(s): Leighton Ruff, MD  ASSISTANT: Darci Current Simaan, PA-C     ANESTHESIA:   general  EBL:269ml  Total I/O In: 2525 [I.V.:2525] Out: 600 [Urine:300; Drains:50; Blood:250]  DRAINS: (47F) Jackson-Pratt drain(s) with closed bulb suction in the pelvis   SPECIMEN:  Source of Specimen:  sigmoid colon  DISPOSITION OF SPECIMEN:  PATHOLOGY  COUNTS:  YES  PLAN OF CARE: inpatient  PATIENT DISPOSITION:  PACU - hemodynamically stable.  INDICATION: 83 y.o. F with severe diverticular disease and benign appearing stricture on endoscopy.  Unable to tolerate PO intake well.   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  General anesthesia was induced without difficulty. SCDs were also noted to be in place prior to the initiation of anesthesia.  The patient was then placed in lithotomy position and prepped and draped in the usual sterile fashion.   A surgical timeout was performed indicating the correct patient, procedure, positioning and need for preoperative antibiotics.   I began by making a lower midline infraumbilical incision with a 10 blade scalpel. This was carried down through subcutaneous continues tissues using electrocautery. The fascia was entered at midline. The peritoneum was entered bluntly. I evaluated the abdomen. There was no signs of generalized  Inflammation. The sigmoid colon was adherent to the left pelvic sidewall and densely  adherent to the bladder with a large inflammatory mass anteriorly. Posteriorly the colon was soft. I began by mobilizing the sigmoid colon off of the left pelvic sidewall using Metzenbaum scissors. I carried dissection out as far down as I could safely go. I then packed off the small bowel into the upper abdomen and placed a wound retractor.  I was unable to bluntly mobilize the sigmoid colon anteriorly off the bladder using finger fracture. I was also able to fracture the sigmoid colon off of the left pelvic sidewall. I identified the left ureter coursing over the skeletonized iliac vessels.  This was preserved. I divided the colon proximally with a 75 mm blue load GIA stapler. I then divided the mesentery using a LigaSure device. I identified a portion of distal rectum which was soft. I divided this with a green load contour stapler. I then divided the remaining mesentery using the LigaSure device. The specimen was sent off the field and several pieces. The pelvis was irrigated. There was approximately 1.5 cm defect in the posterior wall of the bladder where the patient had a colovesical fistula.  This was closed using interrupted 2-0 Vicryl sutures. It was oversewn using more interrupted 2-0 Vicryl sutures. The bladder was then insufflated with saline and no leaks were identified. I then evaluated the remainder of the colon.  I decided what appeared to be the remaining sigmoid colon. The descending colon was mobilized into the pelvis using blunt dissection off of the left lateral sidewall. A 2-0 Prolene pursestring was applied and a 29 mm EEA anvil was tied around the pursestring. An anastomosis was created using the remainder of the rectal stump and proximal colon. There was no  tension.  There was no leak when tested underwater. There was a good pulsatile vessel leading up and to the anastomosis.  Given his lack of inflammation within the abdomen and the good perfusion of the colon I decided not to perform a  diverting ostomy. The abdomen was irrigated. A 19 [place] Blake drain was placed into the pelvis between the rectum and the bladder and brought out through the right lower quadrant. This was sutured into place with a 2-0 nylon suture.  The fascia was closed using interrupted 0 Novafil sutures. The subcutaneous tissue was closed using a running 2-0 Vicryl suture. The skin was closed using a running 4-0 Vicryl suture. A sterile dressing was applied.

## 2017-01-14 NOTE — Progress Notes (Signed)
[facility] Progress Note  Day of Surgery  Subjective: CC: recurrent diverticulitis  Ready for surgery.  No further questions  Objective: Vital signs in last 24 hours: Temp:  [97.8 F (36.6 C)-98.6 F (37 C)] 97.8 F (36.6 C) (04/12 0830) Pulse Rate:  [57-64] 57 (04/12 0830) Resp:  [18] 18 (04/12 0830) BP: (118-152)/(49-75) 118/75 (04/12 0830) SpO2:  [94 %-99 %] 99 % (04/12 0830) Last BM Date: 01/13/17  Intake/Output from previous day: 04/11 0701 - 04/12 0700 In: 900 [I.V.:600; IV Piggyback:300] Out: -  Intake/Output this shift: Total I/O In: 125 [I.V.:125] Out: -   PE: Gen:  Alert, NAD, pleasant Card:  Regular rate and rhythm Pulm:  Normal respiratory effort, clear to auscultation bilaterally Abd: Soft, non-tender, mild distention, bowel sounds present in all 4 quadrants, previous laparotomy scar noted. Ext:  No erythema, edema, or tenderness  Lab Results:   Recent Labs  01/11/17 1033  WBC 12.9*  HGB 11.2*  HCT 34.6*  PLT 436*   BMET  Recent Labs  01/11/17 1033  NA 138  K 4.7  CL 105  CO2 25  GLUCOSE 88  BUN 14  CREATININE 0.91  CALCIUM 8.6*   CMP     Component Value Date/Time   NA 138 01/11/2017 1033   K 4.7 01/11/2017 1033   CL 105 01/11/2017 1033   CO2 25 01/11/2017 1033   GLUCOSE 88 01/11/2017 1033   BUN 14 01/11/2017 1033   CREATININE 0.91 01/11/2017 1033   CALCIUM 8.6 (L) 01/11/2017 1033   PROT 6.7 01/11/2017 1033   ALBUMIN 3.0 (L) 01/11/2017 1033   AST 19 01/11/2017 1033   ALT 12 (L) 01/11/2017 1033   ALKPHOS 43 01/11/2017 1033   BILITOT 0.5 01/11/2017 1033   GFRNONAA 56 (L) 01/11/2017 1033   GFRAA >60 01/11/2017 1033   Lipase     Component Value Date/Time   LIPASE 18 01/06/2017 1526    Anti-infectives: Anti-infectives    Start     Dose/Rate Route Frequency Ordered Stop   01/11/17 1200  [MAR Hold]  Ampicillin-Sulbactam (UNASYN) 3 g in sodium chloride 0.9 % 100 mL IVPB     (MAR Hold since 01/14/17 0929)   3  g 200 mL/hr over 30 Minutes Intravenous Every 6 hours 01/11/17 0926       Assessment/Plan Sigmoid stricture  - FLEXIBLE SIGMOIDOSCOPY 01/11/17: Congested, erythematous and friable (with contact bleeding) mucosa in the recto-sigmoid colon. Biopsied - path negative for malignancy  - continue slow bowel prep     Plan: clear liquid diet and slow bowel prep with Miralax.  To OR today for sigmoidectomy    LOS: 3 days    Rosario Adie, MD  [facility]

## 2017-01-15 MED ORDER — KETOROLAC TROMETHAMINE 30 MG/ML IJ SOLN
30.0000 mg | Freq: Three times a day (TID) | INTRAMUSCULAR | Status: AC | PRN
  Administered 2017-01-15 – 2017-01-16 (×3): 30 mg via INTRAVENOUS
  Filled 2017-01-15 (×3): qty 1

## 2017-01-15 NOTE — Care Management Important Message (Signed)
Important Message  Patient Details  Name: Charlene [name] MRN: [id number] Date of Birth: 1928/03/12   Medicare Important Message Given:  Yes    Kerin Salen 01/15/2017, 10:03 AMImportant Message  Patient Details  Name: Charlene [name] MRN: [id number] Date of Birth: January 11, 1928   Medicare Important Message Given:  Yes    Kerin Salen 01/15/2017, 10:03 AM

## 2017-01-15 NOTE — Progress Notes (Signed)
[facility] Progress Note  1 Day Post-Op  Subjective: CC: abdominal pain  Pt is having abdominal pain more around drain site. No nausea, vomiting, fever or chills overnight. Pt is tolerating ice chips. Pt had flatus this morning. No cough.   Objective: Vital signs in last 24 hours: Temp:  [97.2 F (36.2 C)-98.6 F (37 C)] 98.6 F (37 C) (04/13 0504) Pulse Rate:  [59-71] 59 (04/13 0504) Resp:  [13-16] 16 (04/13 0504) BP: (107-155)/(44-66) 113/44 (04/13 0504) SpO2:  [96 %-100 %] 98 % (04/13 0504) Last BM Date: 01/13/17  Intake/Output from previous day: 04/12 0701 - 04/13 0700 In: 3533.8 [I.V.:3533.8] Out: 1690 [Urine:1150; Drains:290; Blood:250] Intake/Output this shift: No intake/output data recorded.  PE: Gen:  Alert, NAD, pleasant, cooperative, well appearing, lying in bed Card:  RRR, no M/G/R heard Pulm:  CTA, no W/R/R, effort normal Abd: Soft, not distended, +BS, incision with honeycomb and minimal blood on dressing, drain with minimal serosanguinous drainage, appropriately tender Skin: no rashes noted, warm and dry  Lab Results:  No results for input(s): WBC, HGB, HCT, PLT in the last 72 hours. BMET No results for input(s): NA, K, CL, CO2, GLUCOSE, BUN, CREATININE, CALCIUM in the last 72 hours. PT/INR No results for input(s): LABPROT, INR in the last 72 hours. CMP     Component Value Date/Time   NA 138 01/11/2017 1033   K 4.7 01/11/2017 1033   CL 105 01/11/2017 1033   CO2 25 01/11/2017 1033   GLUCOSE 88 01/11/2017 1033   BUN 14 01/11/2017 1033   CREATININE 0.91 01/11/2017 1033   CALCIUM 8.6 (L) 01/11/2017 1033   PROT 6.7 01/11/2017 1033   ALBUMIN 3.0 (L) 01/11/2017 1033   AST 19 01/11/2017 1033   ALT 12 (L) 01/11/2017 1033   ALKPHOS 43 01/11/2017 1033   BILITOT 0.5 01/11/2017 1033   GFRNONAA 56 (L) 01/11/2017 1033   GFRAA >60 01/11/2017 1033   Lipase     Component Value Date/Time   LIPASE 18 01/06/2017 1526        Studies/Results: No results found.  Anti-infectives: Anti-infectives    Start     Dose/Rate Route Frequency Ordered Stop   01/11/17 1200  Ampicillin-Sulbactam (UNASYN) 3 g in sodium chloride 0.9 % 100 mL IVPB  Status:  Discontinued     3 g 200 mL/hr over 30 Minutes Intravenous Every 6 hours 01/11/17 0926 01/14/17 1448       Assessment/Plan Diverticulitis with colo-vesicular fistula - S/P OPEN SIGMOIDECTOMY WITH REPAIR OF COLOVESICULAR FISTULA, Dr. Marcello Moores, 01/14/17  FEN: NPO VTE: lovenox ID: [email or web address] 4/9-4/12  Dispo: pt had flatus today. Will start on clears for lunch. AM labs, PT consult pending   LOS: 4 days    Kalman Drape , [facility] 01/15/2017, 8:30 AM Pager: [phone] Consults: [phone] Mon-Fri 7:00 am-4:30 pm Sat-Sun 7:00 am-11:30 am

## 2017-01-16 LAB — BASIC METABOLIC PANEL
Anion gap: 7 (ref 5–15)
BUN: 12 mg/dL (ref 6–20)
CHLORIDE: 105 mmol/L (ref 101–111)
CO2: 24 mmol/L (ref 22–32)
Calcium: 7.9 mg/dL — ABNORMAL LOW (ref 8.9–10.3)
Creatinine, Ser: 0.81 mg/dL (ref 0.44–1.00)
GFR calc Af Amer: >60 mL/min (ref >60)
GFR calc non Af Amer: >60 mL/min (ref >60)
GLUCOSE: 97 mg/dL (ref 65–99)
Potassium: 4.7 mmol/L (ref 3.5–5.1)
Sodium: 136 mmol/L (ref 135–145)

## 2017-01-16 LAB — CBC
HCT: 27.7 % — ABNORMAL LOW (ref 36.0–46.0)
HEMOGLOBIN: 9 g/dL — AB (ref 12.0–15.0)
MCH: 27.9 pg (ref 26.0–34.0)
MCHC: 32.5 g/dL (ref 30.0–36.0)
MCV: 85.8 fL (ref 78.0–100.0)
Platelets: 360 10*3/uL (ref 150–400)
RBC: 3.23 MIL/uL — ABNORMAL LOW (ref 3.87–5.11)
RDW: 14.3 % (ref 11.5–15.5)
WBC: 16.9 10*3/uL — AB (ref 4.0–10.5)

## 2017-01-16 NOTE — Progress Notes (Signed)
Pt refusing to walk d/t pain even after being medicated. Patient educated to the necessity of walking. Will continue to encourage and monitor patient.

## 2017-01-16 NOTE — Evaluation (Signed)
Physical Therapy Evaluation Patient Details Name: Charlene [name] MRN: [id number] DOB: 06/29/1928 Today's Date: 01/16/2017   History of Present Illness  83 y.o.female with worsening pelvic pain. Pt s/p open sigmoidectomy with repair of colovesicular fistula. PMH: cancer, macular degeneration, depression, BPPV, ARF  Clinical Impression  Pt admitted with above diagnosis. Pt currently with functional limitations due to the deficits listed below (see PT Problem List). Pt able to ambulate 400 ft, holding IV pole per her request. Pt states that she was not using any assistive device at home PTA. Will attempt ambulation without support to better assess if any DME is needed at home. Pt reports feeling fatigued, returned to bed after session. Pt will benefit from skilled PT to increase their independence and safety with mobility to allow discharge to the venue listed below.       Follow Up Recommendations Home health PT;Supervision - Intermittent    Equipment Recommendations   (to be determined)    Recommendations for Other Services       Precautions / Restrictions Precautions Precautions: Fall Restrictions Weight Bearing Restrictions: No      Mobility  Bed Mobility Overal bed mobility: Needs Assistance Bed Mobility: Sit to Supine       Sit to supine: Min guard   General bed mobility comments: guard for safety   Transfers Overall transfer level: Needs assistance Equipment used: None Transfers: Sit to/from Stand Sit to Stand: Supervision         General transfer comment: good stability with standing  Ambulation/Gait Ambulation/Gait assistance: Supervision Ambulation Distance (Feet): 400 Feet Assistive device:  (IV pole) Gait Pattern/deviations: Step-through pattern;Decreased stride length Gait velocity: decreased   General Gait Details: Pt requesting use of IV pole during ambulation  Stairs            Wheelchair Mobility    Modified Rankin (Stroke Patients  Only)       Balance Overall balance assessment: Needs assistance Sitting-balance support: No upper extremity supported Sitting balance-Leahy Scale: Good     Standing balance support: During functional activity Standing balance-Leahy Scale: Fair Standing balance comment: using support during ambulation                             Pertinent Vitals/Pain Pain Assessment: 0-10 Pain Score: 3  Pain Location: abdomen Pain Descriptors / Indicators: Tightness (full) Pain Intervention(s): Limited activity within patient's tolerance;Monitored during session    [place] expects to be discharged to:: Private residence Living Arrangements: Alone Available Help at Discharge: Family;Available PRN/intermittently Type of Home: House Home Access: Stairs to enter Entrance Stairs-Rails: None Entrance Stairs-Number of Steps: 1 Home Layout: One level Home Equipment: None Additional Comments: daughter will be stying with her initially during the day. Will be alone in the evening.     Prior Function Level of Independence: Independent               Hand Dominance        Extremity/Trunk Assessment   Upper Extremity Assessment Upper Extremity Assessment: Overall WFL for tasks assessed    Lower Extremity Assessment Lower Extremity Assessment: Generalized weakness       Communication   Communication: No difficulties  Cognition Arousal/Alertness: Awake/alert Behavior During Therapy: WFL for tasks assessed/performed Overall Cognitive Status: Within Functional Limits for tasks assessed  General Comments      Exercises     Assessment/Plan    PT Assessment Patient needs continued PT services  PT Problem List Decreased strength;Decreased activity tolerance;Decreased balance;Decreased mobility       PT Treatment Interventions DME instruction;Gait training;Stair training;Functional mobility  training;Therapeutic activities;Therapeutic exercise;Balance training;Patient/family education    PT Goals (Current goals can be found in the Care Plan section)  Acute Rehab PT Goals Patient Stated Goal: go home PT Goal Formulation: With patient Time For Goal Achievement: 01/30/17 Potential to Achieve Goals: Good    Frequency Min 3X/week   Barriers to discharge        Co-evaluation               End of Session Equipment Utilized During Treatment:  (declined gait belt use) Activity Tolerance: Patient limited by fatigue Patient left: in bed;with call bell/phone within reach Nurse Communication: Mobility status PT Visit Diagnosis: Unsteadiness on feet (R26.81)    Time: 5397-6734 PT Time Calculation (min) (ACUTE ONLY): 28 min   Charges:   PT Evaluation $PT Eval Low Complexity: 1 Procedure PT Treatments $Gait Training: 8-22 mins   PT G Codes:        Cassell Clement, PT, CSCS Pager [phone] Office [place] 01/16/2017, 1:40 PM

## 2017-01-16 NOTE — Progress Notes (Signed)
  General Surgery [facility]  Assessment & Plan:  POD#2 status post open sigmoid colectomy with repair colovesical fistula  Full liquid diet  Needs OOB, ambulated  Leave drain and Foley in place until day #7        Earnstine Regal, MD, [facility]       Office: [phone]    Subjective: Patient in bed, more comfortable with Toradol.  Loose BM's yesterday.  Wants to walk today.  Objective: Vital signs in last 24 hours: Temp:  [98.7 F (37.1 C)-99.6 F (37.6 C)] 99.3 F (37.4 C) (04/14 0537) Pulse Rate:  [62-69] 67 (04/14 0537) Resp:  [16-18] 16 (04/14 0537) BP: (123-131)/(40-57) 131/46 (04/14 0537) SpO2:  [92 %-94 %] 94 % (04/14 0537) Last BM Date: 01/15/17  Intake/Output from previous day: 04/13 0701 - 04/14 0700 In: 1981.3 [P.O.:710; I.V.:1271.3] Out: 1335 [Urine:1300; Drains:35] Intake/Output this shift: No intake/output data recorded.  Physical Exam: HEENT - sclerae clear, mucous membranes moist Neck - soft Abdomen - soft, mild distension; few BS present; midline wound dry and intact; JP with thin serous; concentrated urine in bag Ext - no edema, non-tender Neuro - alert & oriented, no focal deficits  Lab Results:   Recent Labs  01/16/17 0503  WBC 16.9*  HGB 9.0*  HCT 27.7*  PLT 360   BMET  Recent Labs  01/16/17 0503  NA 136  K 4.7  CL 105  CO2 24  GLUCOSE 97  BUN 12  CREATININE 0.81  CALCIUM 7.9*   PT/INR No results for input(s): LABPROT, INR in the last 72 hours. Comprehensive Metabolic Panel:    Component Value Date/Time   NA 136 01/16/2017 0503   NA 138 01/11/2017 1033   K 4.7 01/16/2017 0503   K 4.7 01/11/2017 1033   CL 105 01/16/2017 0503   CL 105 01/11/2017 1033   CO2 24 01/16/2017 0503   CO2 25 01/11/2017 1033   BUN 12 01/16/2017 0503   BUN 14 01/11/2017 1033   CREATININE 0.81 01/16/2017 0503   CREATININE 0.91 01/11/2017 1033   GLUCOSE 97 01/16/2017 0503   GLUCOSE 88  01/11/2017 1033   CALCIUM 7.9 (L) 01/16/2017 0503   CALCIUM 8.6 (L) 01/11/2017 1033   AST 19 01/11/2017 1033   AST 20 01/06/2017 1526   ALT 12 (L) 01/11/2017 1033   ALT 13 (L) 01/06/2017 1526   ALKPHOS 43 01/11/2017 1033   ALKPHOS 43 01/06/2017 1526   BILITOT 0.5 01/11/2017 1033   BILITOT 0.7 01/06/2017 1526   PROT 6.7 01/11/2017 1033   PROT 6.1 (L) 01/06/2017 1526   ALBUMIN 3.0 (L) 01/11/2017 1033   ALBUMIN 2.4 (L) 01/06/2017 1526    Studies/Results: No results found.    Charlene [name] 01/16/2017  Patient ID: Charlene [name], female   DOB: 01/29/1928, 83 y.o.   MRN: [id number]

## 2017-01-17 MED ORDER — BOOST PLUS PO LIQD
237.0000 mL | Freq: Three times a day (TID) | ORAL | Status: DC
  Administered 2017-01-18 – 2017-01-20 (×6): 237 mL via ORAL
  Filled 2017-01-17 (×14): qty 237

## 2017-01-17 MED ORDER — HYDROCODONE-ACETAMINOPHEN 5-325 MG PO TABS
1.0000 | ORAL_TABLET | ORAL | Status: DC | PRN
  Administered 2017-01-17: 1 via ORAL
  Administered 2017-01-17: 2 via ORAL
  Administered 2017-01-18: 1 via ORAL
  Filled 2017-01-17: qty 2
  Filled 2017-01-17 (×2): qty 1

## 2017-01-17 NOTE — Progress Notes (Signed)
  General Surgery [facility]  Assessment & Plan:  POD#3 status post open sigmoid colectomy with repair colovesical fistula             Advance to regular diet             OOB, ambulate in halls             Leave drain and Foley in place until day #7  Patient and family interested in possible Rehab on discharge - will ask SW and CM to evaluate        Earnstine Regal, MD, [facility]       Office: [phone]    Subjective: Patient in bed, fatigued.  Wants to eat regular diet.  Having BM's, passing flatus.  Objective: Vital signs in last 24 hours: Temp:  [98.2 F (36.8 C)-98.5 F (36.9 C)] 98.5 F (36.9 C) (04/15 0450) Pulse Rate:  [60-69] 60 (04/15 0450) Resp:  [14-20] 20 (04/15 0450) BP: (122-154)/(46-50) 154/48 (04/15 0450) SpO2:  [95 %-99 %] 99 % (04/15 0450) Last BM Date: 01/16/17  Intake/Output from previous day: 04/14 0701 - 04/15 0700 In: 1580 [P.O.:380; I.V.:1200] Out: 1171 [Urine:1150; Drains:20; Stool:1] Intake/Output this shift: No intake/output data recorded.  Physical Exam: HEENT - sclerae clear, mucous membranes moist Abdomen - soft, minimal distension; active BS present; wound dry and intact; serosanguinous in JP drain Ext - no edema, non-tender Neuro - alert & oriented, no focal deficits  Lab Results:   Recent Labs  01/16/17 0503  WBC 16.9*  HGB 9.0*  HCT 27.7*  PLT 360   BMET  Recent Labs  01/16/17 0503  NA 136  K 4.7  CL 105  CO2 24  GLUCOSE 97  BUN 12  CREATININE 0.81  CALCIUM 7.9*   PT/INR No results for input(s): LABPROT, INR in the last 72 hours. Comprehensive Metabolic Panel:    Component Value Date/Time   NA 136 01/16/2017 0503   NA 138 01/11/2017 1033   K 4.7 01/16/2017 0503   K 4.7 01/11/2017 1033   CL 105 01/16/2017 0503   CL 105 01/11/2017 1033   CO2 24 01/16/2017 0503   CO2 25 01/11/2017 1033   BUN 12 01/16/2017 0503   BUN 14 01/11/2017 1033   CREATININE  0.81 01/16/2017 0503   CREATININE 0.91 01/11/2017 1033   GLUCOSE 97 01/16/2017 0503   GLUCOSE 88 01/11/2017 1033   CALCIUM 7.9 (L) 01/16/2017 0503   CALCIUM 8.6 (L) 01/11/2017 1033   AST 19 01/11/2017 1033   AST 20 01/06/2017 1526   ALT 12 (L) 01/11/2017 1033   ALT 13 (L) 01/06/2017 1526   ALKPHOS 43 01/11/2017 1033   ALKPHOS 43 01/06/2017 1526   BILITOT 0.5 01/11/2017 1033   BILITOT 0.7 01/06/2017 1526   PROT 6.7 01/11/2017 1033   PROT 6.1 (L) 01/06/2017 1526   ALBUMIN 3.0 (L) 01/11/2017 1033   ALBUMIN 2.4 (L) 01/06/2017 1526    Studies/Results: No results found.    Jannelly Bergren M 01/17/2017  Patient ID: Shawnee Knapp, female   DOB: 1928-06-07, 83 y.o.   MRN: [id number]

## 2017-01-18 DIAGNOSIS — N321 Vesicointestinal fistula: Secondary | ICD-10-CM

## 2017-01-18 DIAGNOSIS — C19 Malignant neoplasm of rectosigmoid junction: Secondary | ICD-10-CM

## 2017-01-18 LAB — CREATININE, FLUID (PLEURAL, PERITONEAL, JP DRAINAGE): Creat, Fluid: 0.9 mg/dL

## 2017-01-18 MED ORDER — SODIUM CHLORIDE 0.9% FLUSH
3.0000 mL | Freq: Two times a day (BID) | INTRAVENOUS | Status: DC
  Administered 2017-01-18 – 2017-01-20 (×5): 3 mL via INTRAVENOUS

## 2017-01-18 MED ORDER — METOPROLOL TARTRATE 5 MG/5ML IV SOLN: 5.0000 mg | Freq: Four times a day (QID) | INTRAVENOUS | Status: DC | PRN

## 2017-01-18 MED ORDER — PROCHLORPERAZINE EDISYLATE 5 MG/ML IJ SOLN: 5.0000 mg | INTRAMUSCULAR | Status: DC | PRN

## 2017-01-18 MED ORDER — LIP MEDEX EX OINT
1.0000 "application " | TOPICAL_OINTMENT | Freq: Two times a day (BID) | CUTANEOUS | Status: DC
  Administered 2017-01-18 – 2017-01-20 (×4): 1 via TOPICAL
  Filled 2017-01-18: qty 7

## 2017-01-18 MED ORDER — SODIUM CHLORIDE 0.9 % IV SOLN: 250.0000 mL | INTRAVENOUS | Status: DC | PRN

## 2017-01-18 MED ORDER — METHOCARBAMOL 500 MG PO TABS: 1000.0000 mg | ORAL_TABLET | Freq: Four times a day (QID) | ORAL | Status: DC | PRN

## 2017-01-18 MED ORDER — SACCHAROMYCES BOULARDII 250 MG PO CAPS
250.0000 mg | ORAL_CAPSULE | Freq: Two times a day (BID) | ORAL | Status: DC
  Administered 2017-01-18 – 2017-01-21 (×5): 250 mg via ORAL
  Filled 2017-01-18 (×5): qty 1

## 2017-01-18 MED ORDER — LACTATED RINGERS IV BOLUS (SEPSIS): 1000.0000 mL | Freq: Three times a day (TID) | INTRAVENOUS | Status: AC | PRN

## 2017-01-18 MED ORDER — PHENOL 1.4 % MT LIQD: 2.0000 | OROMUCOSAL | Status: DC | PRN

## 2017-01-18 MED ORDER — MAGIC MOUTHWASH: 15.0000 mL | Freq: Four times a day (QID) | ORAL | Status: DC | PRN

## 2017-01-18 MED ORDER — SODIUM CHLORIDE 0.9% FLUSH: 3.0000 mL | INTRAVENOUS | Status: DC | PRN

## 2017-01-18 MED ORDER — METOPROLOL TARTRATE 12.5 MG HALF TABLET: 12.5000 mg | ORAL_TABLET | Freq: Two times a day (BID) | ORAL | Status: DC | PRN

## 2017-01-18 MED ORDER — PSYLLIUM 95 % PO PACK
1.0000 | PACK | Freq: Every day | ORAL | Status: DC
Start: 2017-01-18 — End: 2017-01-21
  Administered 2017-01-18 – 2017-01-21 (×4): 1 via ORAL
  Filled 2017-01-18 (×4): qty 1

## 2017-01-18 MED ORDER — IMPACT PO LIQD
237.0000 mL | Freq: Three times a day (TID) | ORAL | Status: DC
  Filled 2017-01-18 (×2): qty 237

## 2017-01-18 MED ORDER — ACETAMINOPHEN 500 MG PO TABS
1000.0000 mg | ORAL_TABLET | Freq: Three times a day (TID) | ORAL | Status: DC
Start: 2017-01-18 — End: 2017-01-21
  Administered 2017-01-18 – 2017-01-21 (×7): 1000 mg via ORAL
  Filled 2017-01-18 (×9): qty 2

## 2017-01-18 MED ORDER — TRAMADOL HCL 50 MG PO TABS
50.0000 mg | ORAL_TABLET | Freq: Four times a day (QID) | ORAL | Status: DC | PRN
  Administered 2017-01-18: 100 mg via ORAL
  Administered 2017-01-19: 50 mg via ORAL
  Administered 2017-01-20: 100 mg via ORAL
  Filled 2017-01-18: qty 2
  Filled 2017-01-18: qty 1
  Filled 2017-01-18: qty 2

## 2017-01-18 MED ORDER — FLUTICASONE PROPIONATE 50 MCG/ACT NA SUSP
2.0000 | Freq: Every day | NASAL | Status: DC | PRN
  Filled 2017-01-18: qty 16

## 2017-01-18 MED ORDER — HYDRALAZINE HCL 20 MG/ML IJ SOLN: 5.0000 mg | Freq: Four times a day (QID) | INTRAMUSCULAR | Status: DC | PRN

## 2017-01-18 MED ORDER — METHOCARBAMOL 1000 MG/10ML IJ SOLN: 1000.0000 mg | Freq: Four times a day (QID) | INTRAVENOUS | Status: DC | PRN

## 2017-01-18 MED ORDER — ALUM & MAG HYDROXIDE-SIMETH 200-200-20 MG/5ML PO SUSP
30.0000 mL | Freq: Four times a day (QID) | ORAL | Status: DC | PRN
  Administered 2017-01-20: 30 mL via ORAL
  Filled 2017-01-18 (×2): qty 30

## 2017-01-18 MED ORDER — MENTHOL 3 MG MT LOZG: 1.0000 | LOZENGE | OROMUCOSAL | Status: DC | PRN

## 2017-01-18 MED ORDER — FAMOTIDINE 20 MG PO TABS
20.0000 mg | ORAL_TABLET | Freq: Two times a day (BID) | ORAL | Status: DC | PRN
  Administered 2017-01-20: 20 mg via ORAL
  Filled 2017-01-18: qty 1

## 2017-01-18 MED ORDER — SODIUM CHLORIDE 0.9 % IV BOLUS (SEPSIS)
500.0000 mL | Freq: Once | INTRAVENOUS | Status: AC
  Administered 2017-01-18: 500 mL via INTRAVENOUS

## 2017-01-18 NOTE — Progress Notes (Signed)
4 Days Post-Op  Subjective: Chief complaint: Diverticulitis with colovesicular fistula.  Patient is not having a good day. She reports pain today. She was not aware she had asked for pain medications and has had none today. She is eating very little, says the food isn't good, has not felt like eating. She reports getting up once and felt dizzy this a.m. Drain site is tender and painful, midline is also tender. She is not aware of any physical therapy.    Objective: Vital signs in last 24 hours: Temp:  [97.6 F (36.4 C)-98.1 F (36.7 C)] 98.1 F (36.7 C) (04/16 0534) Pulse Rate:  [56-65] 56 (04/16 0534) Resp:  [17-18] 17 (04/16 0534) BP: (107-132)/(39-44) 132/44 (04/16 0534) SpO2:  [95 %-97 %] 95 % (04/16 0534) Last BM Date: 01/17/17 600 by mouth 1200 IV 1980 urine Drain 55 Stool 1 Afebrile vital signs are stable No labs since 01/16/17 CT scan 12/22/16  Intake/Output from previous day: 04/15 0701 - 04/16 0700 In: 1800 [P.O.:600; I.V.:1200] Out: 2035 [Urine:1980; Drains:55] Intake/Output this shift: No intake/output data recorded.  General appearance: alert, cooperative and no distress Resp: clear to auscultation bilaterally and Anterior and posterior exam. GI: Soft, tender, midline incisions healing nicely. Drainage is clear serosanguineous fluid. She reports one BM rather soft today.  Lab Results:   Recent Labs  01/16/17 0503  WBC 16.9*  HGB 9.0*  HCT 27.7*  PLT 360    BMET  Recent Labs  01/16/17 0503  NA 136  K 4.7  CL 105  CO2 24  GLUCOSE 97  BUN 12  CREATININE 0.81  CALCIUM 7.9*   PT/INR No results for input(s): LABPROT, INR in the last 72 hours.  No results for input(s): AST, ALT, ALKPHOS, BILITOT, PROT, ALBUMIN in the last 168 hours.   Lipase     Component Value Date/Time   LIPASE 18 01/06/2017 1526     Studies/Results: No results found.  Medications: . enoxaparin (LOVENOX) injection  40 mg Subcutaneous Q24H  . feeding supplement  1  Container Oral TID BM  . lactose free nutrition  237 mL Oral TID WC   . dextrose 5 % and 0.45 % NaCl with KCl 20 mEq/L 50 mL/hr (01/18/17 1046)   Assessment/Plan Diverticulitis with colovesicular fistula. Status post open sigmoid colectomy with repair of colovesicular fistula, 3/33/54 Dr. Leighton Ruff  POD 4  (Keep drain and foley x 7 days on checkout.) History of acute renal failure History of benign proximal positional vertigo. History of GERD History of macular degeneration History of uterine carcinoma FEN: IV fluids/regular diet DVT: Lovenox Antibiotics: Unasyn 4 days then Discontinued on 01/14/17   Plan: She has generalized deconditioning. A clinic give her some extra fluid now. I instructed her to call and ask for pain medications. I instructed to use incentive spirometer every hour. Will get physical therapy and occupational therapy to see her. Check orthostatic blood pressure she has a history of proximal positional vertigo.     LOS: 7 days    Zakariye Nee 01/18/2017 [phone]

## 2017-01-18 NOTE — Progress Notes (Signed)
Spoke with patient at bedside and daughter on the phone. Discussed d/c options, patient and daughter expressed interest in SNF for rehab. Daughter has looked into hiring private help to come in. Plan to reevaluate with PT today, f/u with patient and daughter in am. Daughter flying in today from out of state.

## 2017-01-18 NOTE — Progress Notes (Signed)
PT Cancellation Note  Patient Details Name: MONTY MCCARRELL MRN: [id number] DOB: [birth year]   Cancelled Treatment:     Pt had a pre syncope episode this morning when getting up with staff.  Came back a second time pt was eating lunch.  Came back a third time pt's IV BOLUS running and not yet complete.  Will need to try back in the morning.     Rica Koyanagi  PTA [facility]  Acute  Rehab Pager      [phone]

## 2017-01-18 NOTE — Progress Notes (Signed)
CSW consult for placement. Plan: Home with H. H services.

## 2017-01-18 NOTE — Progress Notes (Signed)
Was notified by pathology today that there is adenocarcinoma noted in her specimen.  They are working to identify lymph nodes and margins.  I discussed these findings over the phone with the patient to make her aware.  We also discussed that a cancer operation was not performed due to her endoscopic evaluation being negative. We also discussed that she has a high likelihood of recurrence.  Currently, she does not want any further cancer treatment.  We will discuss further when she returns to the office and we have her final pathology report available.

## 2017-01-19 LAB — COMPREHENSIVE METABOLIC PANEL
ALK PHOS: 35 U/L — AB (ref 38–126)
ALT: 12 U/L — AB (ref 14–54)
AST: 18 U/L (ref 15–41)
Albumin: 1.9 g/dL — ABNORMAL LOW (ref 3.5–5.0)
Anion gap: 4 — ABNORMAL LOW (ref 5–15)
BUN: 11 mg/dL (ref 6–20)
CO2: 24 mmol/L (ref 22–32)
CREATININE: 0.61 mg/dL (ref 0.44–1.00)
Calcium: 7.6 mg/dL — ABNORMAL LOW (ref 8.9–10.3)
Chloride: 108 mmol/L (ref 101–111)
GFR calc non Af Amer: >60 mL/min (ref >60)
GLUCOSE: 92 mg/dL (ref 65–99)
Potassium: 4.2 mmol/L (ref 3.5–5.1)
SODIUM: 136 mmol/L (ref 135–145)
Total Bilirubin: 0.2 mg/dL — ABNORMAL LOW (ref 0.3–1.2)
Total Protein: 5.1 g/dL — ABNORMAL LOW (ref 6.5–8.1)

## 2017-01-19 LAB — CBC
HCT: 24.3 % — ABNORMAL LOW (ref 36.0–46.0)
HEMOGLOBIN: 8 g/dL — AB (ref 12.0–15.0)
MCH: 28.6 pg (ref 26.0–34.0)
MCHC: 32.9 g/dL (ref 30.0–36.0)
MCV: 86.8 fL (ref 78.0–100.0)
Platelets: 408 10*3/uL — ABNORMAL HIGH (ref 150–400)
RBC: 2.8 MIL/uL — AB (ref 3.87–5.11)
RDW: 14.6 % (ref 11.5–15.5)
WBC: 10.4 10*3/uL (ref 4.0–10.5)

## 2017-01-19 LAB — PREALBUMIN: PREALBUMIN: 9.5 mg/dL — AB (ref 18–38)

## 2017-01-19 MED ORDER — RESOURCE INSTANT PROTEIN PO PWD PACKET
1.0000 | Freq: Three times a day (TID) | ORAL | Status: DC
  Administered 2017-01-20 (×2): 6 g via ORAL
  Filled 2017-01-19 (×7): qty 6

## 2017-01-19 MED ORDER — BENEPROTEIN PO POWD
1.0000 | Freq: Three times a day (TID) | ORAL | Status: DC
  Administered 2017-01-19: 6 g via ORAL
  Filled 2017-01-19 (×2): qty 227

## 2017-01-19 NOTE — Progress Notes (Signed)
5 Days Post-Op  Subjective: CC: Diverticulitis with colovesicular fistula  She looks better today and reports she is eating more.  She did walk some yesterday.  PT still has not gotten into see her yet.  Her incision looks fine.    Objective: Vital signs in last 24 hours: Temp:  [97.8 F (36.6 C)-98.8 F (37.1 C)] 97.8 F (36.6 C) (04/17 0454) Pulse Rate:  [59-63] 63 (04/17 0454) Resp:  [18] 18 (04/17 0454) BP: (123-142)/(40-49) 123/44 (04/17 0454) SpO2:  [95 %-99 %] 97 % (04/17 0454) Last BM Date: 01/18/17 400 IV nothing PO recorded 1700 urine Drain 40 Afebrile, VSS, no orthostatic changes. Her baseline diastolic pressure is low. CMP shows low albumin/protein. CBC shows white count is normal hemoglobin is down to 8 hematocrit down to 24. Prealbumin is 9.5  Intake/Output from previous day: 04/16 0701 - 04/17 0700 In: 400 [I.V.:400] Out: 1740 [Urine:1700; Drains:40] Intake/Output this shift: No intake/output data recorded.  General appearance: alert, cooperative and no distress Resp: clear to auscultation bilaterally GI: soft, sore, site looks fine.  tolerating diet and eating more.    Lab Results:   Recent Labs  01/19/17 0440  WBC 10.4  HGB 8.0*  HCT 24.3*  PLT 408*    BMET  Recent Labs  01/19/17 0440  NA 136  K 4.2  CL 108  CO2 24  GLUCOSE 92  BUN 11  CREATININE 0.61  CALCIUM 7.6*   PT/INR No results for input(s): LABPROT, INR in the last 72 hours.   Recent Labs Lab 01/19/17 0440  AST 18  ALT 12*  ALKPHOS 35*  BILITOT 0.2*  PROT 5.1*  ALBUMIN 1.9*     Lipase     Component Value Date/Time   LIPASE 18 01/06/2017 1526     Studies/Results: No results found.  Medications: . acetaminophen  1,000 mg Oral TID  . enoxaparin (LOVENOX) injection  40 mg Subcutaneous Q24H  . lactose free nutrition  237 mL Oral TID WC  . lip balm  1 application Topical BID  . psyllium  1 packet Oral Daily  . saccharomyces boulardii  250 mg Oral BID  .  sodium chloride flush  3 mL Intravenous Q12H   Colon, biopsy, sigmoid 01/11/17 - FOCAL EROSION WITH HYPEREMIA AND REACTIVE LYMPHOID AGGREGATE. - NO GRANULOMAS, ADENOMATOUS CHANGE OR EVIDENCE OF MALIGNANCY.  Final path on the surgical specimen is still pending    Assessment/Plan Diverticulitis with colovesicular fistula. Status post open sigmoid colectomy with repair of colovesicular fistula, 6/71/24 Dr. Leighton Ruff  POD 5  (Keep drain and foley x 7 days on checkout.) Malnutrition/Deconditioning - prealbumin 9.5 Anemia History of acute renal failure History of benign proximal positional vertigo. History of GERD History of macular degeneration History of uterine carcinoma FEN: IV fluids/regular diet DVT: Lovenox ID: Unasyn 4 days then Discontinued on 01/14/17  Plan:  Continue to mobilize her and work on nutrition.  Continue drain and foley for now.  Family here to look into post hospital care.    Reviewed with Dr. Marcello Moores. Tentatively plan to get a cystoscopy tomorrow and make a decision on the Foley and JP drain.    LOS: 8 days    Charlene [name] 01/19/2017 [phone]

## 2017-01-19 NOTE — Progress Notes (Addendum)
Physical Therapy Treatment Patient Details Name: Charlene [name] MRN: [id number] DOB: January 27, 1928 Today's Date: 01/19/2017    History of Present Illness 83 y.o.female with worsening pelvic pain. Pt s/p open sigmoidectomy with repair of colovesicular fistula. PMH: cancer, macular degeneration, depression, BPPV, ARF    PT Comments    Orthostatic BP's taken Supine          BP 143/47(73), HR 65, RA 100% EOB             BP 132/62(84), HR 63, RA 95% Standing       BP 126/50(71), HR 80, RA 95% Amb 10 feet  BP 114/50(69), HR 88, RA 100%   Mild c/o dizziness  Assisted OOB pt required increased time and + 2 assist for gait safety.  Amb with walker first 12 feet pt demonstarted min gait instability then amb without any AD as prior to admit she did not use any AD noted increased gait instability, shuffled short stride and limited distance due to weakness/fatigue.  Pt admits to poor PO intake since her surgery and feels very weak.  HIGH FALL RISK. Pt will need ST Rehab at SNF prior to safely returning home alone.  Consulted with LPT Britt to review and confirm.   Follow Up Recommendations  SNF     Equipment Recommendations       Recommendations for Other Services       Precautions / Restrictions Precautions Precautions: Fall Precaution Comments: recent ABD surgery/R LQ bulb Restrictions Weight Bearing Restrictions: No    Mobility  Bed Mobility Overal bed mobility: Needs Assistance Bed Mobility: Supine to Sit     Supine to sit: Mod assist     General bed mobility comments: HOB elevated, use of rail and wxtra assist for upper body due to recent ABD surgery  Transfers Overall transfer level: Needs assistance Equipment used: Rolling walker (2 wheeled) Transfers: Sit to/from [facility] Sit to Stand: Min assist Stand pivot transfers: Min assist       General transfer comment: 25% VC's on proper hand placement and increased time to rise due to ABD  pain/tightness  Ambulation/Gait Ambulation/Gait assistance: Min assist;+2 safety/equipment Ambulation Distance (Feet): 24 Feet Assistive device: None;Rolling walker (2 wheeled) Gait Pattern/deviations: Step-to pattern;Step-through pattern;Decreased step length - right;Decreased step length - left;Trunk flexed;Shuffle Gait velocity: decreased   General Gait Details: amb 12 feet with RW Min Assist with min c/o instability then amb 12 feet without any AD (prior to admit pt amb Indep no AD) noted increased gait instability, decreased stride length and shuffled gait with increased c/o instability and balance deficit.  Also, amb distance limited due to max c/o fatigue and werakness.  Pt reports poor PO intake since surgery.     Stairs            Wheelchair Mobility    Modified Rankin (Stroke Patients Only)       Balance                                            Cognition Arousal/Alertness: Awake/alert Behavior During Therapy: WFL for tasks assessed/performed Overall Cognitive Status: Within Functional Limits for tasks assessed  Exercises      General Comments        Pertinent Vitals/Pain Pain Assessment: 0-10 Pain Score: 4  Pain Location: abdomen Pain Descriptors / Indicators: Discomfort;Grimacing;Tightness Pain Intervention(s): Monitored during session;Repositioned    Home Living                      Prior Function            PT Goals (current goals can now be found in the care plan section) Progress towards PT goals: Progressing toward goals (slowly)    Frequency    Min 3X/week      PT Plan Current plan remains appropriate    Co-evaluation             End of Session Equipment Utilized During Treatment: Gait belt Activity Tolerance: Patient limited by fatigue Patient left: in chair;with call bell/phone within reach;with family/visitor present Nurse  Communication: Mobility status PT Visit Diagnosis: Unsteadiness on feet (R26.81)     Time: 1324-4010 PT Time Calculation (min) (ACUTE ONLY): 26 min  Charges:  $Gait Training: 8-22 mins $Therapeutic Activity: 8-22 mins                    G Codes:       Rica Koyanagi  PTA [facility]  Acute  Rehab Pager      [phone]

## 2017-01-19 NOTE — Clinical Social Work Note (Signed)
Clinical Social Work Assessment  Patient Details  Name: Charlene [name] MRN: [id number] Date of Birth: 04/25/1928  Date of referral:  01/19/17               Reason for consult:  Facility Placement                Permission sought to share information with:  Family Supports, Customer service manager Permission granted to share information::     Name::       Medlin,Dennis P  Agency::  Skilled Nursing   Relationship::   Son  Contact Information:    [phone]  Housing/Transportation Living arrangements for the past 2 months:  [place] of Information:  Patient, Adult Children Patient Interpreter Needed:  None Criminal Activity/Legal Involvement Pertinent to Current Situation/Hospitalization:  No - Comment as needed Significant Relationships:  Adult Children Lives with:  Self Do you feel safe going back to the place where you live?  No Need for family participation in patient care:  Yes (Comment)  Care giving concerns:  Patient unable to care for self at home alone. Patient needs supervision and Physical therapy before going home.    Social Worker assessment / plan:  CSW met with patient and adult children at bedside, explain role and reason for csw visit. Patient family is agreeable to SNF placement. Patient lives alone and children cannot provide 24 hour care. Daughter lives out of town. Patient and children state [facility] is preferable. Assessment, PASRR completed. Called [facility] and provided all clinical information.   Employment status:  Retired Nurse, adult PT Recommendations:  [place], [place] / Referral to community resources:  [place]  Patient/Family's Response to care:  Agreeable and Responding well to care.   Patient/Family's Understanding of and Emotional Response to Diagnosis, Current Treatment, and Prognosis:  Patients Adult children at bedside,  they prefer patient to go to facility close to home.   Emotional Assessment Appearance:  Developmentally appropriate Attitude/Demeanor/Rapport:    Affect (typically observed):  Accepting, Pleasant Orientation:  Oriented to Self, Oriented to Place, Oriented to  Time, Oriented to Situation Alcohol / Substance use:  Not Applicable Psych involvement (Current and /or in the community):  No (Comment)  Discharge Needs  Concerns to be addressed:  Discharge Planning Concerns Readmission within the last 30 days:  No Current discharge risk:  None Barriers to Discharge:  No Barriers Identified   Lia Hopping, LCSW 01/19/2017, 11:08 AM

## 2017-01-19 NOTE — Care Management Important Message (Signed)
Important Message  Patient Details  Name: Charlene [name] MRN: [id number] Date of Birth: 1928/08/30   Medicare Important Message Given:  Yes    Kerin Salen 01/19/2017, 10:16 AMImportant Message  Patient Details  Name: Charlene [name] MRN: [id number] Date of Birth: 1928-06-22   Medicare Important Message Given:  Yes    Kerin Salen 01/19/2017, 10:16 AM

## 2017-01-19 NOTE — NC FL2 (Signed)
[place] LEVEL OF CARE SCREENING TOOL     IDENTIFICATION  Patient Name: Charlene [name] Birthdate: [birth year] Sex: female Admission Date (Current Location): 01/11/2017  [facility] and [place] Number:  Herbalist and Address:  [facility],  [place] [address] [place]      Provider Number: [id number]  Attending Physician Name and Address:  Md Edison Pace, MD  Relative Name and Phone Number:       Current Level of Care: Hospital Recommended Level of Care: [place] Prior Approval Number:    Date Approved/Denied:   PASRR Number:   [id number]   Discharge Plan: Skilled Nursing Facility    Current Diagnoses: Patient Active Problem List   Diagnosis Date Noted  . Colovesical fistula with cancer s/p colectomy/repair 01/14/2017 01/18/2017  . 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 Protein-calorie malnutrition, severe 02/01/2016  . Depression 01/31/2016  . GERD (gastroesophageal reflux disease) 01/31/2016  . Hemorrhoids 01/31/2016  . Acute diverticulitis 05/05/2015  . Abnormal LFTs 05/05/2015  . Dyslipidemia 03/13/2015  . Leukocytosis 03/13/2015  . Acute renal failure ([place]) 03/13/2015    Orientation RESPIRATION BLADDER Height & Weight     Self, Time, Situation, Place  Normal Continent, Indwelling catheter Weight: 134 lb (60.8 kg) Height:  5\' 4"  (162.6 cm)  BEHAVIORAL SYMPTOMS/MOOD NEUROLOGICAL BOWEL NUTRITION STATUS      Continent Diet: Regular  AMBULATORY STATUS COMMUNICATION OF NEEDS Skin   Limited Assist Verbally  (Abdomen-Incision Niel Hummer Tube)                       Personal Care Assistance Level of Assistance  Bathing, Feeding, Dressing Bathing Assistance: Limited assistance Feeding assistance: Independent Dressing Assistance: Limited assistance     Functional Limitations Info  Sight, Speech, Hearing Sight Info: Adequate Hearing Info: Adequate Speech Info:  Adequate    SPECIAL CARE FACTORS FREQUENCY  PT (By licensed PT)     PT Frequency: 5              Contractures Contractures Info: Not present    Additional Factors Info  Code Status, Allergies Code Status Info: Fullcode Allergies Info: No Known Allergies           Current Medications (01/19/2017):  This is the current hospital active medication list Current Facility-Administered Medications  Medication Dose Route Frequency Provider Last Rate Last Dose  . 0.9 %  sodium chloride infusion  250 mL Intravenous PRN Michael Boston, MD      . acetaminophen (TYLENOL) tablet 1,000 mg  1,000 mg Oral TID Michael Boston, MD   1,000 mg at 01/19/17 0951  . alum & mag hydroxide-simeth (MAALOX/MYLANTA) 200-200-20 MG/5ML suspension 30 mL  30 mL Oral Q6H PRN Michael Boston, MD      . diphenhydrAMINE (BENADRYL) 12.5 MG/5ML elixir 12.5 mg  12.5 mg Oral L3Y PRN Leighton Ruff, MD       Or  . diphenhydrAMINE (BENADRYL) injection 12.5 mg  12.5 mg Intravenous B0F PRN Leighton Ruff, MD      . enoxaparin (LOVENOX) injection 40 mg  40 mg Subcutaneous B51W Leighton Ruff, MD   40 mg at 01/18/17 1840  . famotidine (PEPCID) tablet 20 mg  20 mg Oral BID PRN Michael Boston, MD      . fluticasone (FLONASE) 50 MCG/ACT nasal spray 2 spray  2 spray Each Nare Daily PRN Michael Boston, MD      . hydrALAZINE (APRESOLINE) injection  5-20 mg  5-20 mg Intravenous Q6H PRN Michael Boston, MD      . lactated ringers bolus 1,000 mL  1,000 mL Intravenous Q8H PRN Michael Boston, MD      . lactose free nutrition (BOOST PLUS) liquid 237 mL  237 mL Oral TID WC Armandina Gemma, MD   237 mL at 01/18/17 1200  . lip balm (CARMEX) ointment 1 application  1 application Topical BID Michael Boston, MD   1 application at 25/05/39 1000  . magic mouthwash  15 mL Oral QID PRN Michael Boston, MD      . menthol-cetylpyridinium (CEPACOL) lozenge 3 mg  1 lozenge Oral PRN Michael Boston, MD      . methocarbamol (ROBAXIN) 1,000 mg in dextrose 5 % 50 mL IVPB  1,000 mg  Intravenous Q6H PRN Michael Boston, MD      . methocarbamol (ROBAXIN) tablet 1,000 mg  1,000 mg Oral Q6H PRN Michael Boston, MD      . metoprolol (LOPRESSOR) injection 5 mg  5 mg Intravenous Q6H PRN Michael Boston, MD      . metoprolol tartrate (LOPRESSOR) tablet 12.5 mg  12.5 mg Oral Q12H PRN Michael Boston, MD      . phenol (CHLORASEPTIC) mouth spray 2 spray  2 spray Mouth/Throat PRN Michael Boston, MD      . polyvinyl alcohol (LIQUIFILM TEARS) 1.4 % ophthalmic solution 1 drop  1 drop Both Eyes PRN Leighton Ruff, MD      . prochlorperazine (COMPAZINE) injection 5-10 mg  5-10 mg Intravenous Q4H PRN Michael Boston, MD      . psyllium (HYDROCIL/METAMUCIL) packet 1 packet  1 packet Oral Daily Michael Boston, MD   1 packet at 01/19/17 1000  . saccharomyces boulardii (FLORASTOR) capsule 250 mg  250 mg Oral BID Michael Boston, MD   250 mg at 01/19/17 0951  . sodium chloride flush (NS) 0.9 % injection 3 mL  3 mL Intravenous Q12H Michael Boston, MD   3 mL at 01/19/17 0953  . sodium chloride flush (NS) 0.9 % injection 3 mL  3 mL Intravenous PRN Michael Boston, MD      . traMADol Veatrice Bourbon) tablet 50-100 mg  50-100 mg Oral Q6H PRN Michael Boston, MD   100 mg at 01/18/17 1840     Discharge Medications: Please see discharge summary for a list of discharge medications.  Relevant Imaging Results:  Relevant Lab Results:   Additional Information ssn:[id number]  Lia Hopping, LCSW

## 2017-01-19 NOTE — Progress Notes (Signed)
Nutrition Follow-up  DOCUMENTATION CODES:   Severe malnutrition in context of acute illness/injury  INTERVENTION:   -Continue Boost Plus chocolate TID- Each supplement provides 360kcal and 14g protein.   -Provide Beneprotein TID, each packet provides 25 kcal and 6g protein. -Encourage small frequent meals -Reviewed protein foods with patient and family  -RD to continue to monitor  NUTRITION DIAGNOSIS:   Malnutrition (Severe) related to acute illness (colonic stricture) as evidenced by percent weight loss, severe depletion of body fat, severe depletion of muscle mass.  Ongoing.  GOAL:   Patient will meet greater than or equal to 90% of their needs  Progressing.  MONITOR:   PO intake, Supplement acceptance, Labs, Weight trends, I & O's  ASSESSMENT:   83 y.o. F with worsening pelvic pain.  CT scan was obtained ~2 months ago and an inflammatory mass was found in the pelvis.  This was thought to be due to diverticulitis but it did not resolve with antibiotics on repeat CT scan.  4/12: s/p OPEN SIGMOIDECTOMY WITH REPAIR OF COLOVESICULAR FISTULA   Pt in room with family at bedside. Pt reports she ate macaroni and cheese for lunch yesterday, drank an Ensure supplement for dinner and for breakfast this morning she consumed 3 pieces of french toast. Pt states she feels that her appetite is slowly improving.  Pt with increased needs d/t recent surgery and new diagnosis of cancer of the rectosigmoid junction.  Reviewed protein options on the hospital menu. Pt was told to avoid fibrous fruits/vegetables. Pt will try and eat small frequent meals. Pt states she will try Beneprotein to mix with foods as well.   Labs reviewed. Medications: Florastor capsule BID  Diet Order:  Diet regular Room service appropriate? Yes; Fluid consistency: Thin  Skin:  Wound (see comment) (4/12 abdominal incision)  Last BM:  4/16  Height:   Ht Readings from Last 1 Encounters:  01/11/17 5\' 4"  (1.626 m)     Weight:   Wt Readings from Last 1 Encounters:  01/11/17 134 lb (60.8 kg)    Ideal Body Weight:  54.5 kg  BMI:  Body mass index is 23 kg/m.  Estimated Nutritional Needs:   Kcal:  1600-1800  Protein:  70-80g  Fluid:  1.8L/day  EDUCATION NEEDS:   Education needs addressed  Clayton Bibles, MS, RD, LDN Pager: [phone] After Hours Pager: [phone]

## 2017-01-19 NOTE — Progress Notes (Signed)
Plan for d/c to SNF, discharge planning per CSW. [phone] 

## 2017-01-20 ENCOUNTER — Inpatient Hospital Stay (HOSPITAL_COMMUNITY): Payer: Medicare Other

## 2017-01-20 MED ORDER — IOTHALAMATE MEGLUMINE 17.2 % UR SOLN
250.0000 mL | Freq: Once | URETHRAL | Status: AC | PRN
  Administered 2017-01-20: 250 mL via INTRAVESICAL

## 2017-01-20 MED ORDER — FAMOTIDINE 10 MG PO CHEW: 20.0000 mg | CHEWABLE_TABLET | Freq: Every evening | ORAL | Status: AC | PRN

## 2017-01-20 MED ORDER — RESOURCE INSTANT PROTEIN PO PWD PACKET: ORAL | Status: AC

## 2017-01-20 MED ORDER — TRAMADOL HCL 50 MG PO TABS: 50.0000 mg | ORAL_TABLET | Freq: Four times a day (QID) | ORAL | 0 refills | Status: DC | PRN

## 2017-01-20 NOTE — Progress Notes (Signed)
CSW spoke daughter about completing paperwork in the 11:00 a.m at [facility]. Daughter agreeable. She reports the patient plans to transport by care.  CSW  will continue to assist with discharge.   Kathrin Greathouse, Latanya Presser, MSW Clinical Social Worker 5E and Psychiatric Service Line [phone] 01/20/2017  4:07 PM

## 2017-01-20 NOTE — Progress Notes (Signed)
Patient accepted bed offer to [facility]F. CSW will continue to assist with discharge planning.   Kathrin Greathouse, Latanya Presser, MSW Clinical Social Worker 5E and Psychiatric Service Line [phone] 01/20/2017  10:40 AM

## 2017-01-20 NOTE — Evaluation (Signed)
OT Cancellation Note  Patient Details Name: Charlene [name] MRN: [id number] DOB: 08-26-1928   Cancelled Treatment:    Reason Eval/Treat Not Completed: Patient at procedure or test/ unavailable, will check back at a later time.   Lou Cal, OT Pager [phone] 01/20/2017   Raymondo Band 01/20/2017, 11:48 AM

## 2017-01-20 NOTE — Progress Notes (Signed)
6 Days Post-Op  Subjective:   CC: Colovesicular fistula  She continues to make good improvement. She just had her first bowel movement since starting back on a diet. Her incision looks good. Drainage from her JP is clear serous appearing fluid. She has made a choice for her skilled nursing facility at [facility].  Objective: Vital signs in last 24 hours: Temp:  [97.9 F (36.6 C)-98.3 F (36.8 C)] 97.9 F (36.6 C) (04/18 0540) Pulse Rate:  [58-62] 61 (04/18 0540) Resp:  [16-17] 16 (04/18 0540) BP: (106-134)/(33-89) 134/89 (04/18 0540) SpO2:  [94 %-95 %] 95 % (04/18 0540) Last BM Date: 01/18/17 840 PO Urine 1250 Drain 35 Afebrile, VSS BP diastolic looks better NO labs this AM Intake/Output from previous day: 04/17 0701 - 04/18 0700 In: 300 [P.O.:840; I.V.:3] Out: 1285 [Urine:1250; Drains:35] Intake/Output this shift: No intake/output data recorded.  General appearance: alert, cooperative and no distress Resp: clear to auscultation bilaterally GI: Soft, still little sore incisions healing nicely. JP drainage as noted above.  Lab Results:   Recent Labs  01/19/17 0440  WBC 10.4  HGB 8.0*  HCT 24.3*  PLT 408*    BMET  Recent Labs  01/19/17 0440  NA 136  K 4.2  CL 108  CO2 24  GLUCOSE 92  BUN 11  CREATININE 0.61  CALCIUM 7.6*   PT/INR No results for input(s): LABPROT, INR in the last 72 hours.   Recent Labs Lab 01/19/17 0440  AST 18  ALT 12*  ALKPHOS 35*  BILITOT 0.2*  PROT 5.1*  ALBUMIN 1.9*     Lipase     Component Value Date/Time   LIPASE 18 01/06/2017 1526     Studies/Results: No results found.  Medications: . acetaminophen  1,000 mg Oral TID  . enoxaparin (LOVENOX) injection  40 mg Subcutaneous Q24H  . lactose free nutrition  237 mL Oral TID WC  . lip balm  1 application Topical BID  . protein supplement  1 scoop Oral TID WC  . psyllium  1 packet Oral Daily  . saccharomyces boulardii  250 mg Oral BID  . sodium chloride flush   3 mL Intravenous Q12H    Assessment/Plan Diverticulitis with colovesicular fistula. Status post open sigmoid colectomy with repair of colovesicular fistula, 7/62/26 Dr. Leighton Ruff POD 6 (Keep drain and foley x 7 days on checkout.) Malnutrition/Deconditioning - prealbumin 9.5 Anemia History of acute renal failure History of benign proximal positional vertigo. History of GERD History of macular degeneration History of uterine carcinoma FEN: IV fluids/regular diet DVT: Lovenox ID: Unasyn 4 days then Discontinued on 01/14/17   Plan: If her cystogram is negative today will plan to DC the Foley in the drain. If there is a leak we will continue with the drain and Foley. I would anticipate she could go to skilled nursing facility tomorrow.   Cystogram:  Patient was filled to maximal tolerance.  The bladder shows postoperative deformity of the superior and posterior aspect, site of repaired fistula. No luminal irregularity or mass is noted. No leak or vesicoureteral reflux.  Plan to D/C drain and foley.       LOS: 9 days    Charlene [name] 01/20/2017 [phone]

## 2017-01-20 NOTE — Discharge Instructions (Signed)
[facility]      [facility] [phone]  OPEN ABDOMINAL SURGERY: POST OP INSTRUCTIONS  Always review your discharge instruction sheet given to you by the facility where your surgery was performed.  IF YOU HAVE DISABILITY OR FAMILY LEAVE FORMS, YOU MUST BRING THEM TO THE OFFICE FOR PROCESSING.  PLEASE DO NOT GIVE THEM TO YOUR DOCTOR.  1. A prescription for pain medication may be given to you upon discharge.  Take your pain medication as prescribed, if needed.  If narcotic pain medicine is not needed, then you may take acetaminophen (Tylenol) or ibuprofen (Advil) as needed. 2. Take your usually prescribed medications unless otherwise directed. 3. If you need a refill on your pain medication, please contact your pharmacy. They will contact our office to request authorization.  Prescriptions will not be filled after 5pm or on week-ends. 4. You should follow a light diet the first few days after arrival home, such as soup and crackers, pudding, etc.unless your doctor has advised otherwise. A high-fiber, low fat diet can be resumed as tolerated.   Be sure to include lots of fluids daily. Most patients will experience some swelling and bruising on the chest and neck area.  Ice packs will help.  Swelling and bruising can take several days to resolve 5. Most patients will experience some swelling and bruising in the area of the incision. Ice pack will help. Swelling and bruising can take several days to resolve..  6. It is common to experience some constipation if taking pain medication after surgery.  Increasing fluid intake and taking a stool softener will usually help or prevent this problem from occurring.  A mild laxative (Milk of Magnesia or Miralax) should be taken according to package directions if there are no bowel movements after 48 hours. 7.  You may have steri-strips (small skin tapes) in place directly over the incision.  These strips should be left on the skin for 7-10 days.  If your  surgeon used skin glue on the incision, you may shower in 24 hours.  The glue will flake off over the next 2-3 weeks.  Any sutures or staples will be removed at the office during your follow-up visit. You may find that a light gauze bandage over your incision may keep your staples from being rubbed or pulled. You may shower and replace the bandage daily. 8. ACTIVITIES:  You may resume regular (light) daily activities beginning the next day--such as daily self-care, walking, climbing stairs--gradually increasing activities as tolerated.  You may have sexual intercourse when it is comfortable.  Refrain from any heavy lifting or straining until approved by your doctor. a. You may drive when you no longer are taking prescription pain medication, you can comfortably wear a seatbelt, and you can safely maneuver your car and apply brakes b. Return to Work: ___________________________________ 9. You should see your doctor in the office for a follow-up appointment approximately two weeks after your surgery.  Make sure that you call for this appointment within a day or two after you arrive home to insure a convenient appointment time. OTHER INSTRUCTIONS:  _____________________________________________________________ _____________________________________________________________  WHEN TO CALL YOUR DOCTOR: 1. Fever over 101.0 2. Inability to urinate 3. Nausea and/or vomiting 4. Extreme swelling or bruising 5. Continued bleeding from incision. 6. Increased pain, redness, or drainage from the incision. 7. Difficulty swallowing or breathing 8. Muscle cramping or spasms. 9. Numbness or tingling in hands or feet or around lips.  The clinic staff is available to   answer your questions during regular business hours.  Please don't hesitate to call and ask to speak to one of the nurses if you have concerns.  For further questions, please visit [email or web address]   

## 2017-01-20 NOTE — Evaluation (Signed)
Occupational Therapy Evaluation Patient Details Name: Charlene [name] MRN: [id number] DOB: June 21, 1928 Today's Date: 01/20/2017    History of Present Illness 83 y.o.female with worsening pelvic pain. Pt s/p open sigmoidectomy with repair of colovesicular fistula. PMH: cancer, macular degeneration, depression, BPPV, ARF   Clinical Impression   This 83 y/o F presents with the above. Pt lives alone and was previously completing ADLs independently and physically active. Pt will benefit from continued acute OT services to increase endurance, safety and independence with completing ADLs and functional mobility. Goals have been set for [employer] to supervision.     Follow Up Recommendations  SNF    Equipment Recommendations  Other (comment) (TBD at next venue )           Precautions / Restrictions Precautions Precautions: Fall Precaution Comments: recent ABD surgery/R LQ bulb Restrictions Weight Bearing Restrictions: No      Mobility Bed Mobility Overal bed mobility: Needs Assistance Bed Mobility: Supine to Sit     Supine to sit: Min guard;HOB elevated Sit to supine: Min guard;HOB elevated   General bed mobility comments: verbal cues for implementing log rolling technique to decrease strain in abdomen  Transfers Overall transfer level: Needs assistance Equipment used: None Transfers: Sit to/from Stand Sit to Stand: Min guard              Balance Overall balance assessment: Needs assistance Sitting-balance support: Single extremity supported Sitting balance-Leahy Scale: Good     Standing balance support: During functional activity Standing balance-Leahy Scale: Fair                             ADL either performed or assessed with clinical judgement   ADL Overall ADL's : Needs assistance/impaired Eating/Feeding: Independent;Sitting   Grooming: Oral care;Min guard;Standing   Upper Body Bathing: Min guard;Sitting   Lower Body Bathing: Sit to/from  stand;Min guard   Upper Body Dressing : Set up;Sitting   Lower Body Dressing: Minimal assistance;Sit to/from stand Lower Body Dressing Details (indicate cue type and reason): using figure 4 technique  Toilet Transfer: Min guard;Ambulation;Comfort height toilet;Grab bars   Toileting- Clothing Manipulation and Hygiene: Min guard;Sit to/from stand       Functional mobility during ADLs: Min guard General ADL Comments: Pt completed room level functional mobility to complete standing grooming ADLs, educated on bed mobility techniques and compensatory techniques for completing ADLs                          Pertinent Vitals/Pain Pain Assessment: Faces Faces Pain Scale: Hurts a little bit Pain Location: abdomen Pain Descriptors / Indicators: Discomfort;Sore Pain Intervention(s): Limited activity within patient's tolerance;Monitored during session;Premedicated before session          Extremity/Trunk Assessment Upper Extremity Assessment Upper Extremity Assessment: Generalized weakness           Communication Communication Communication: No difficulties   Cognition Arousal/Alertness: Awake/alert Behavior During Therapy: WFL for tasks assessed/performed Overall Cognitive Status: Within Functional Limits for tasks assessed                                     General Comments                  Home Living Family/patient expects to be discharged to:: Skilled nursing facility Living Arrangements: Alone Available Help at  Discharge: [place] Shower/Tub: Walk-in shower   Bathroom Toilet: Standard         Additional Comments: Pt initially going to SNF for rehab before returning home       Prior Functioning/Environment Level of Independence: Independent                 OT Problem List: Decreased strength;Decreased activity tolerance;Pain      OT Treatment/Interventions: Self-care/ADL  training;Therapeutic exercise;Energy conservation;Patient/family education;DME and/or AE instruction;Therapeutic activities    OT Goals(Current goals can be found in the care plan section) Acute Rehab OT Goals Patient Stated Goal: to get back to being active and doing daily activities independently OT Goal Formulation: With patient Time For Goal Achievement: 01/27/17 Potential to Achieve Goals: Good ADL Goals Pt Will Perform Grooming: with supervision;standing Pt Will Perform Lower Body Dressing: with min guard assist;sit to/from stand Pt Will Perform Toileting - Clothing Manipulation and hygiene: with supervision;sit to/from stand Additional ADL Goal #1: Pt will independently demonstrate at least one energy conservation technique during ADL task completion.   OT Frequency: Min 2X/week                             End of Session Equipment Utilized During Treatment: Gait belt  Activity Tolerance: Patient tolerated treatment well Patient left: in bed;with call bell/phone within reach  OT Visit Diagnosis: Unsteadiness on feet (R26.81);Muscle weakness (generalized) (M62.81)                Time: 2671-2458 OT Time Calculation (min): 22 min Charges:  OT General Charges $OT Visit: 1 Procedure OT Evaluation $OT Eval Low Complexity: 1 Procedure    Charlene [name], OT Pager [phone] 01/20/2017   Charlene [name] 01/20/2017, 2:56 PM

## 2017-01-20 NOTE — Discharge Summary (Signed)
Physician Discharge Summary  Patient ID: Charlene [name] MRN: [id number] DOB/AGE: May 01, 1928 83 y.o.  Admit date: 01/11/2017 Discharge date: 01/21/2017  Admission Diagnoses:  Sigmoid inflammation that is not resolving with antibiotics Diverticulitis versus cancer History of benign proximal positional vertigo. 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 Colovesical fistula with cancer s/p colectomy/repair 01/14/2017 Malnutrition/Deconditioning - prealbumin 9.5 Anemia History of acute renal failure History of benign proximal positional vertigo. 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  Flexible sigmoidoscopy 0/3/70, Dr. Leighton Ruff 2.  Open sigmoidectomy with repair of colovesicular fistula 4/88/89, Dr. Moishe Spice Course:   83 y.o. F with worsening pelvic pain.  CT scan was obtained ~2 months ago and an inflammatory mass was found in the pelvis.  This was thought to be due to diverticulitis but it did not resolve with antibiotics on repeat CT scan.  We are here today to eval for cancer She underwent US flexible sigmoid os copy on 01/11/17. Pathology came back benign. Risk and benefits of surgery were discussed and the patient detail and she was taken to the operating room again on 01/14/17. She underwent open sigmoidectomy with repair of colovesicular fistula. She is made slow progress secondary to deconditioning just being exhausted. She has made slow but steady progress and is up to a  regular diet. She is tolerating this well. She's having bowel movements. We obtained a cystogram on 01/20/17 and this shows no leak. We have discontinued her drain and Foley, she is voiding without issue. She's been seen by physical therapy and occupational therapy. She plans to transition through [facility] skilled nursing facility. She is on a regular diet with protein supplements. She is free to ambulate. Physical therapy is recommending PT supervision - intermittent. Occupational therapy also recommends SNF.   Her incisions are all healing nicely.  She is ready for discharge.  There've been concerns about the diagnosis. We discussed her pathology report today with her daughter present. We emphasized the need just to get over her surgery, and then discuss any post surgical treatment at a later time. Her incision sites are all healing nicely and she is voiding without difficulty now that the Foley is out also.  Condition ON discharge: Improved  CBC Latest Ref Rng & Units 01/19/2017 01/16/2017 01/11/2017  WBC 4.0 - 10.5 K/uL 10.4 16.9(H) 12.9(H)  Hemoglobin 12.0 - 15.0 g/dL 8.0(L) 9.0(L) 11.2(L)  Hematocrit 36.0 - 46.0 % 24.3(L) 27.7(L) 34.6(L)  Platelets 150 - 400 K/uL 408(H) 360 436(H)    CMP Latest Ref Rng & Units 01/19/2017 01/16/2017 01/11/2017  Glucose 65 - 99 mg/dL 92 97 88  BUN 6 - 20 mg/dL 11 12 14   Creatinine 0.44 - 1.00 mg/dL 0.61 0.81 0.91  Sodium 135 - 145 mmol/L 136 136 138  Potassium 3.5 - 5.1 mmol/L 4.2 4.7 4.7  Chloride 101 - 111 mmol/L 108 105 105  CO2 22 - 32 mmol/L 24 24 25   Calcium 8.9 - 10.3 mg/dL 7.6(L) 7.9(L) 8.6(L)  Total Protein 6.5 - 8.1 g/dL 5.1(L) - 6.7  Total Bilirubin 0.3 -  1.2 mg/dL 0.2(L) - 0.5  Alkaline Phos 38 - 126 U/L 35(L) - 43  AST 15 - 41 U/L 18 - 19  ALT 14 - 54 U/L 12(L) - 12(L)   Surgical pathology 01/14/17 Diagnosis: Colon, segmental resection, sigmoid ADENOCARCINOMA OF THE SIGMOID COLON, GRADE 3 (ABOUT 5.5 CM) THE TUMOR PENETRATES TO THE SURFACE  OF THE VISCERAL PERITONEUM (PT4) LYMPHOVASCULAR INVASION IS IDENTIFIED STAPLED RESECTION MARGINS ARE NEGATIVE FOR CARCINOMA METASTATIC ADENOCARCINOMA IN ONE OF THIRTY LYMPH NODES (1/30) DIVERTICULA WITH INFLAMMATION  Disposition: SNF   Allergies as of 01/21/2017   No Known Allergies     Medication List    STOP taking these medications   bismuth subsalicylate 935 TS/17BL suspension Commonly known as:  PEPTO BISMOL     TAKE these medications   acetaminophen 500 MG tablet Commonly known as:  TYLENOL Take 500 mg by mouth every 6 (six) hours as needed for moderate pain.   famotidine 10 MG chewable tablet Commonly known as:  PEPCID AC Chew 2 tablets (20 mg total) by mouth at bedtime as needed for heartburn. What changed:  how much to take  when to take this   lactose free nutrition Liqd Take 237 mLs by mouth daily.   LUBRICATING EYE DROPS OP Apply 1 drop to eye daily as needed (dry eyes).   MULTIVITAMIN ADULT PO Take 1 tablet by mouth daily.   PRESERVISION AREDS 2 Caps Take 1 capsule by mouth daily.   NASACORT AQ 55 MCG/ACT Aero nasal inhaler Generic drug:  triamcinolone Place 2 sprays into the nose daily as needed (for allergies).   protein supplement 6 g Powd Commonly known as:  RESOURCE BENEPROTEIN You can use whatever protein supplement that taste good to you.  Try to get 2-3 cans per day.  Do this till your ready to go home and take care of yourself.   traMADol 50 MG tablet Commonly known as:  ULTRAM Take 1-2 tablets (50-100 mg total) by mouth every 6 (six) hours as needed for moderate pain or severe pain. What changed:  how much to take  when to take this  reasons to take this      Follow-up Information    Rosario Adie., MD Follow up on 02/05/2017.   Specialty:  General Surgery Why:  Your appointment is at 12:00 PM, be at the office 30 minutes early for check in.   Contact information: [address] [place]  [address] [phone]        Precious Reel, MD Follow up.   Specialty:  Internal Medicine Why:  [facility]All for follow up after you get out of [facility]. Contact information: [address] [place] [address] [phone]           Signed: Earnstine Regal 01/21/2017, 9:23 AM

## 2017-01-21 DIAGNOSIS — E43 Unspecified severe protein-calorie malnutrition: Secondary | ICD-10-CM | POA: Diagnosis not present

## 2017-01-21 DIAGNOSIS — M6281 Muscle weakness (generalized): Secondary | ICD-10-CM | POA: Diagnosis not present

## 2017-01-21 DIAGNOSIS — K219 Gastro-esophageal reflux disease without esophagitis: Secondary | ICD-10-CM | POA: Diagnosis not present

## 2017-01-21 DIAGNOSIS — R1110 Vomiting, unspecified: Secondary | ICD-10-CM | POA: Diagnosis not present

## 2017-01-21 DIAGNOSIS — K5792 Diverticulitis of intestine, part unspecified, without perforation or abscess without bleeding: Secondary | ICD-10-CM | POA: Diagnosis not present

## 2017-01-21 DIAGNOSIS — Z4889 Encounter for other specified surgical aftercare: Secondary | ICD-10-CM | POA: Diagnosis not present

## 2017-01-21 DIAGNOSIS — C187 Malignant neoplasm of sigmoid colon: Secondary | ICD-10-CM | POA: Diagnosis not present

## 2017-01-21 DIAGNOSIS — R278 Other lack of coordination: Secondary | ICD-10-CM | POA: Diagnosis not present

## 2017-01-21 DIAGNOSIS — R109 Unspecified abdominal pain: Secondary | ICD-10-CM | POA: Diagnosis not present

## 2017-01-21 DIAGNOSIS — R630 Anorexia: Secondary | ICD-10-CM | POA: Diagnosis not present

## 2017-01-21 DIAGNOSIS — N321 Vesicointestinal fistula: Secondary | ICD-10-CM | POA: Diagnosis not present

## 2017-01-21 DIAGNOSIS — E46 Unspecified protein-calorie malnutrition: Secondary | ICD-10-CM | POA: Diagnosis not present

## 2017-01-21 DIAGNOSIS — C189 Malignant neoplasm of colon, unspecified: Secondary | ICD-10-CM | POA: Diagnosis not present

## 2017-01-21 DIAGNOSIS — F329 Major depressive disorder, single episode, unspecified: Secondary | ICD-10-CM | POA: Diagnosis not present

## 2017-01-21 NOTE — Progress Notes (Signed)
7 Days Post-Op  Subjective: She looks great. Incisions are healing nicely she is tolerating a diet well and bowels are working.  Objective: Vital signs in last 24 hours: Temp:  [98.1 F (36.7 C)-98.2 F (36.8 C)] 98.1 F (36.7 C) (04/19 0534) Pulse Rate:  [55-64] 64 (04/19 0534) Resp:  [16] 16 (04/19 0534) BP: (115-141)/(42-77) 115/42 (04/19 0534) SpO2:  [95 %-98 %] 95 % (04/19 0534) Last BM Date: 01/21/17 1200 urine  PO not recorded + BM Afebrile, VSS No labs   Intake/Output from previous day: 04/18 0701 - 04/19 0700 In: -  Out: 1220 [Urine:1200; Drains:20] Intake/Output this shift: No intake/output data recorded.  General appearance: alert, cooperative and no distress Resp: clear to auscultation bilaterally GI: Soft, sore, sites all look good. Tolerating regular diet and having bowel movements.  Lab Results:   Recent Labs  01/19/17 0440  WBC 10.4  HGB 8.0*  HCT 24.3*  PLT 408*    BMET  Recent Labs  01/19/17 0440  NA 136  K 4.2  CL 108  CO2 24  GLUCOSE 92  BUN 11  CREATININE 0.61  CALCIUM 7.6*   PT/INR No results for input(s): LABPROT, INR in the last 72 hours.   Recent Labs Lab 01/19/17 0440  AST 18  ALT 12*  ALKPHOS 35*  BILITOT 0.2*  PROT 5.1*  ALBUMIN 1.9*     Lipase     Component Value Date/Time   LIPASE 18 01/06/2017 1526     Studies/Results: Dg Cystogram  Result Date: 01/20/2017 CLINICAL DATA:  Colovesicular fistula. Recent repair, evaluate for leak. EXAM: CYSTOGRAM TECHNIQUE: After catheterization of the urinary bladder following sterile technique the bladder was filled with 250 mL Cysto-Hypaque 30% by drip infusion. Serial spot images were obtained during bladder filling and post draining. FLUOROSCOPY TIME:  Fluoroscopy Time:  42 seconds Radiation Exposure Index (if provided by the fluoroscopic device): 71.2 mGy Number of Acquired Spot Images: 4 COMPARISON:  12/22/2016 abdominal CT FINDINGS: Scout image shows pelvic clips,  surgical drain, and rectal stool. Patient was filled to maximal tolerance. The bladder shows postoperative deformity of the superior and posterior aspect, site of repaired fistula. No luminal irregularity or mass is noted. No leak or vesicoureteral reflux. IMPRESSION: Negative postoperative cystogram.  Negative for leak. Electronically Signed   By: Monte Fantasia M.D.   On: 01/20/2017 12:40    Medications: . acetaminophen  1,000 mg Oral TID  . enoxaparin (LOVENOX) injection  40 mg Subcutaneous Q24H  . lactose free nutrition  237 mL Oral TID WC  . lip balm  1 application Topical BID  . protein supplement  1 scoop Oral TID WC  . psyllium  1 packet Oral Daily  . saccharomyces boulardii  250 mg Oral BID  . 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 Status post open sigmoid colectomy with repair of colovesicular fistula, 1/74/08 Dr. Leighton Ruff POD 6(Keep drain and foley x 7 days on checkout.) Malnutrition/Deconditioning - prealbumin 9.5 Anemia History of acute renal failure History of benign proximal positional vertigo. History of GERD History of macular degeneration History of uterine  carcinoma FEN: IV fluids/regular diet DVT: Lovenox ID: Unasyn 4 days then Discontinued on 01/14/17    Plan: Discharge to rehabilitation today.   LOS: 10 days    Mikena Masoner 01/21/2017 [phone]

## 2017-01-21 NOTE — Progress Notes (Signed)
Patient to be discharged to [facility], report called to  [facility], LPN.  Patient transferred to [facility] via private auto with daughter

## 2017-01-21 NOTE — Clinical Social Work Placement (Signed)
   CLINICAL SOCIAL WORK PLACEMENT  NOTE  Date:  01/21/2017  Patient Details  Name: Charlene [name] MRN: [id number] Date of Birth: 1927-10-12  Clinical Social Work is seeking post-discharge placement for this patient at the [place] level of care (*CSW will initial, date and re-position this form in  chart as items are completed):  Yes   Patient/family provided with [facility] Work Department's list of facilities offering this level of care within the geographic area requested by the patient (or if unable, by the patient's family).  Yes   Patient/family informed of their freedom to choose among providers that offer the needed level of care, that participate in Medicare, Medicaid or managed care program needed by the patient, have an available bed and are willing to accept the patient.  Yes   Patient/family informed of [facility]'s ownership interest in [facility] and [facility], as well as of the fact that they are under no obligation to receive care at these facilities.  PASRR submitted to EDS on       PASRR number received on       Existing PASRR number confirmed on 01/19/17     FL2 transmitted to all facilities in geographic area requested by pt/family on       FL2 transmitted to all facilities within larger geographic area on 01/19/17     Patient informed that his/her managed care company has contracts with or will negotiate with certain facilities, including the following:        No   Patient/family informed of bed offers received.  Patient chooses bed at [facility]     Physician recommends and patient chooses bed at      Patient to be transferred to [facility] on 01/21/17.  Patient to be transferred to facility by Transport by [facility]     Patient family notified on 01/21/17 of transfer.  Name of family member notified:  Daughter:Julie     PHYSICIAN       Additional Comment:     _______________________________________________ Lia Hopping, LCSW 01/21/2017, 10:31 AM

## 2017-01-21 NOTE — Care Management Note (Signed)
Case Management Note  Patient Details  Name: Charlene [name] MRN: [id number] Date of Birth: December 08, 1927  Subjective/Objective:                    Action/Plan:d/c SNF.   Expected Discharge Date:  01/21/17               Expected Discharge Plan:  Skilled Nursing Facility  In-House Referral:  Clinical Social Work  Discharge planning Services  CM Consult  Post Acute Care Choice:  NA Choice offered to:  Patient, Adult Children  DME Arranged:  N/A DME Agency:  NA  HH Arranged:  NA HH Agency:  NA  Status of Service:  Completed, signed off  If discussed at [place] of Stay Meetings, dates discussed:    Additional Comments:  Dessa Phi, RN 01/21/2017, 11:26 AM

## 2017-01-21 NOTE — Progress Notes (Signed)
Physical Therapy Treatment Patient Details Name: Charlene [name] MRN: [id number] DOB: 20-Sep-1928 Today's Date: 01/21/2017    History of Present Illness 83 y.o.female with worsening pelvic pain. Pt s/p open sigmoidectomy with repair of colovesicular fistula. PMH: cancer, macular degeneration, depression, BPPV, ARF    PT Comments    Assisted OOB to amb a greater distance.  Still requires a walker for gait instability.  Follow Up Recommendations  SNF     Equipment Recommendations       Recommendations for Other Services       Precautions / Restrictions Precautions Precautions: Fall Precaution Comments: recent ABD surgery Restrictions Weight Bearing Restrictions: No    Mobility  Bed Mobility Overal bed mobility: Needs Assistance Bed Mobility: Supine to Sit     Supine to sit: Min guard;HOB elevated Sit to supine: Min guard;HOB elevated   General bed mobility comments: verbal cues for implementing log rolling technique to decrease strain in abdomen  Transfers Overall transfer level: Needs assistance Equipment used: None Transfers: Sit to/from Stand Sit to Stand: Min guard         General transfer comment: 25% VC's on proper hand placement and increased time to rise due to ABD pain/tightness  Ambulation/Gait Ambulation/Gait assistance: Min guard;Min assist Ambulation Distance (Feet): 68 Feet Assistive device: None;Rolling walker (2 wheeled) Gait Pattern/deviations: Step-to pattern;Step-through pattern;Decreased step length - right;Decreased step length - left;Trunk flexed;Shuffle Gait velocity: decreased   General Gait Details: amb without walker first 15 feet pt felt to unsteady so cont amb with RW    Stairs            Wheelchair Mobility    Modified Rankin (Stroke Patients Only)       Balance                                            Cognition Arousal/Alertness: Awake/alert Behavior During Therapy: WFL for tasks  assessed/performed Overall Cognitive Status: Within Functional Limits for tasks assessed                                        Exercises      General Comments        Pertinent Vitals/Pain Pain Assessment: Faces Faces Pain Scale: Hurts a little bit Pain Location: abdomen Pain Descriptors / Indicators: Discomfort;Sore Pain Intervention(s): Monitored during session    Home Living                      Prior Function            PT Goals (current goals can now be found in the care plan section) Progress towards PT goals: Progressing toward goals    Frequency    Min 3X/week      PT Plan Current plan remains appropriate    Co-evaluation             End of Session Equipment Utilized During Treatment: Gait belt Activity Tolerance: Patient tolerated treatment well Patient left: in bed;with call bell/phone within reach Nurse Communication: Mobility status PT Visit Diagnosis: Unsteadiness on feet (R26.81)     Time: 2947-6546 PT Time Calculation (min) (ACUTE ONLY): 17 min  Charges:  $Gait Training: 8-22 mins  G Codes:       Rica Koyanagi  PTA [facility]  Acute  Rehab Pager      [phone]

## 2017-01-22 DIAGNOSIS — K219 Gastro-esophageal reflux disease without esophagitis: Secondary | ICD-10-CM | POA: Diagnosis not present

## 2017-01-22 DIAGNOSIS — E43 Unspecified severe protein-calorie malnutrition: Secondary | ICD-10-CM | POA: Diagnosis not present

## 2017-01-22 DIAGNOSIS — C187 Malignant neoplasm of sigmoid colon: Secondary | ICD-10-CM | POA: Diagnosis not present

## 2017-01-22 DIAGNOSIS — N321 Vesicointestinal fistula: Secondary | ICD-10-CM | POA: Diagnosis not present

## 2017-01-26 ENCOUNTER — Other Ambulatory Visit: Payer: Self-pay | Admitting: *Deleted

## 2017-01-26 NOTE — Patient Outreach (Signed)
[facility]) Care Management  01/26/2017  KONA YUSUF [birth year] [id number]   Met with patient at bedside of facility.  Patient reports she recently had abd surgery, she has protein calorie malnutrition.  Before surgery she lived alone and was independent.  She states she plans to have some private duty help upon return home, hoping to go home in the next 1-2 weeks. She states son lives in town and can assist some, her daughter lives in [place], but has been assisting her with looking at agencies. Patient using Whey protein supplements to diet. She feels the therapy is helping her, her goal is to return to baseline independence.   RNCM reviewed [facility] care management, gave [facility] brochure for future reference. Patient denies any [facility] care management needs at this time.   Plan to sign off, patient has [facility] contact for future needs.  Royetta Crochet. Laymond Purser, RN, BSN, [place] [phone]) Business Cell  [phone]) Toll Free Office

## 2017-01-27 DIAGNOSIS — F329 Major depressive disorder, single episode, unspecified: Secondary | ICD-10-CM | POA: Diagnosis not present

## 2017-01-27 DIAGNOSIS — E46 Unspecified protein-calorie malnutrition: Secondary | ICD-10-CM | POA: Diagnosis not present

## 2017-01-27 DIAGNOSIS — K219 Gastro-esophageal reflux disease without esophagitis: Secondary | ICD-10-CM | POA: Diagnosis not present

## 2017-01-27 DIAGNOSIS — C189 Malignant neoplasm of colon, unspecified: Secondary | ICD-10-CM | POA: Diagnosis not present

## 2017-01-29 DIAGNOSIS — F329 Major depressive disorder, single episode, unspecified: Secondary | ICD-10-CM | POA: Diagnosis not present

## 2017-01-29 DIAGNOSIS — E43 Unspecified severe protein-calorie malnutrition: Secondary | ICD-10-CM | POA: Diagnosis not present

## 2017-01-29 DIAGNOSIS — C187 Malignant neoplasm of sigmoid colon: Secondary | ICD-10-CM | POA: Diagnosis not present

## 2017-01-29 DIAGNOSIS — R630 Anorexia: Secondary | ICD-10-CM | POA: Diagnosis not present

## 2017-02-01 DIAGNOSIS — R1110 Vomiting, unspecified: Secondary | ICD-10-CM | POA: Diagnosis not present

## 2017-02-01 DIAGNOSIS — N321 Vesicointestinal fistula: Secondary | ICD-10-CM | POA: Diagnosis not present

## 2017-02-01 DIAGNOSIS — K5792 Diverticulitis of intestine, part unspecified, without perforation or abscess without bleeding: Secondary | ICD-10-CM | POA: Diagnosis not present

## 2017-02-01 DIAGNOSIS — C187 Malignant neoplasm of sigmoid colon: Secondary | ICD-10-CM | POA: Diagnosis not present

## 2017-02-02 ENCOUNTER — Emergency Department (HOSPITAL_COMMUNITY): Payer: Medicare Other

## 2017-02-02 ENCOUNTER — Inpatient Hospital Stay (HOSPITAL_COMMUNITY)
Admission: EM | Admit: 2017-02-02 | Discharge: 2017-02-07 | DRG: 862 | Disposition: A | Discharge status: Home / Self Care | Payer: Medicare Other | Attending: General Surgery | Admitting: General Surgery

## 2017-02-02 ENCOUNTER — Encounter (HOSPITAL_COMMUNITY): Payer: Self-pay | Admitting: Obstetrics and Gynecology

## 2017-02-02 DIAGNOSIS — R112 Nausea with vomiting, unspecified: Secondary | ICD-10-CM | POA: Diagnosis not present

## 2017-02-02 DIAGNOSIS — Z6823 Body mass index (BMI) 23.0-23.9, adult: Secondary | ICD-10-CM | POA: Diagnosis not present

## 2017-02-02 DIAGNOSIS — Z87891 Personal history of nicotine dependence: Secondary | ICD-10-CM

## 2017-02-02 DIAGNOSIS — K56609 Unspecified intestinal obstruction, unspecified as to partial versus complete obstruction: Secondary | ICD-10-CM | POA: Diagnosis not present

## 2017-02-02 DIAGNOSIS — E860 Dehydration: Secondary | ICD-10-CM | POA: Diagnosis present

## 2017-02-02 DIAGNOSIS — Z85038 Personal history of other malignant neoplasm of large intestine: Secondary | ICD-10-CM | POA: Diagnosis not present

## 2017-02-02 DIAGNOSIS — K219 Gastro-esophageal reflux disease without esophagitis: Secondary | ICD-10-CM | POA: Diagnosis present

## 2017-02-02 DIAGNOSIS — R278 Other lack of coordination: Secondary | ICD-10-CM | POA: Diagnosis not present

## 2017-02-02 DIAGNOSIS — Z9049 Acquired absence of other specified parts of digestive tract: Secondary | ICD-10-CM

## 2017-02-02 DIAGNOSIS — Z833 Family history of diabetes mellitus: Secondary | ICD-10-CM

## 2017-02-02 DIAGNOSIS — N739 Female pelvic inflammatory disease, unspecified: Secondary | ICD-10-CM | POA: Diagnosis not present

## 2017-02-02 DIAGNOSIS — Z8249 Family history of ischemic heart disease and other diseases of the circulatory system: Secondary | ICD-10-CM

## 2017-02-02 DIAGNOSIS — Z90710 Acquired absence of both cervix and uterus: Secondary | ICD-10-CM

## 2017-02-02 DIAGNOSIS — K6811 Postprocedural retroperitoneal abscess: Secondary | ICD-10-CM | POA: Diagnosis not present

## 2017-02-02 DIAGNOSIS — Y838 Other surgical procedures as the cause of abnormal reaction of the patient, or of later complication, without mention of misadventure at the time of the procedure: Secondary | ICD-10-CM | POA: Diagnosis present

## 2017-02-02 DIAGNOSIS — N321 Vesicointestinal fistula: Secondary | ICD-10-CM | POA: Diagnosis not present

## 2017-02-02 DIAGNOSIS — E43 Unspecified severe protein-calorie malnutrition: Secondary | ICD-10-CM | POA: Diagnosis present

## 2017-02-02 DIAGNOSIS — T814XXA Infection following a procedure, initial encounter: Principal | ICD-10-CM | POA: Diagnosis present

## 2017-02-02 DIAGNOSIS — R1084 Generalized abdominal pain: Secondary | ICD-10-CM | POA: Diagnosis not present

## 2017-02-02 DIAGNOSIS — K632 Fistula of intestine: Secondary | ICD-10-CM | POA: Diagnosis not present

## 2017-02-02 DIAGNOSIS — Z823 Family history of stroke: Secondary | ICD-10-CM

## 2017-02-02 DIAGNOSIS — C801 Malignant (primary) neoplasm, unspecified: Secondary | ICD-10-CM | POA: Diagnosis not present

## 2017-02-02 DIAGNOSIS — M6281 Muscle weakness (generalized): Secondary | ICD-10-CM | POA: Diagnosis not present

## 2017-02-02 DIAGNOSIS — Z4889 Encounter for other specified surgical aftercare: Secondary | ICD-10-CM | POA: Diagnosis not present

## 2017-02-02 DIAGNOSIS — H3530 Unspecified macular degeneration: Secondary | ICD-10-CM | POA: Diagnosis present

## 2017-02-02 DIAGNOSIS — R109 Unspecified abdominal pain: Secondary | ICD-10-CM | POA: Diagnosis not present

## 2017-02-02 DIAGNOSIS — K5792 Diverticulitis of intestine, part unspecified, without perforation or abscess without bleeding: Secondary | ICD-10-CM | POA: Diagnosis not present

## 2017-02-02 DIAGNOSIS — E785 Hyperlipidemia, unspecified: Secondary | ICD-10-CM | POA: Diagnosis present

## 2017-02-02 DIAGNOSIS — C187 Malignant neoplasm of sigmoid colon: Secondary | ICD-10-CM | POA: Diagnosis not present

## 2017-02-02 DIAGNOSIS — D72829 Elevated white blood cell count, unspecified: Secondary | ICD-10-CM | POA: Diagnosis not present

## 2017-02-02 DIAGNOSIS — R1110 Vomiting, unspecified: Secondary | ICD-10-CM | POA: Diagnosis not present

## 2017-02-02 DIAGNOSIS — K651 Peritoneal abscess: Secondary | ICD-10-CM | POA: Diagnosis not present

## 2017-02-02 DIAGNOSIS — F329 Major depressive disorder, single episode, unspecified: Secondary | ICD-10-CM | POA: Diagnosis not present

## 2017-02-02 DIAGNOSIS — T8143XA Infection following a procedure, organ and space surgical site, initial encounter: Secondary | ICD-10-CM

## 2017-02-02 DIAGNOSIS — T8149XA Infection following a procedure, other surgical site, initial encounter: Secondary | ICD-10-CM

## 2017-02-02 LAB — URINALYSIS, ROUTINE W REFLEX MICROSCOPIC
Bacteria, UA: NONE SEEN
Glucose, UA: NEGATIVE mg/dL
HGB URINE DIPSTICK: NEGATIVE
KETONES UR: 5 mg/dL — AB
Nitrite: NEGATIVE
PROTEIN: 30 mg/dL — AB
Specific Gravity, Urine: 1.024 (ref 1.005–1.030)
pH: 5 (ref 5.0–8.0)

## 2017-02-02 LAB — COMPREHENSIVE METABOLIC PANEL
ALT: 46 U/L (ref 14–54)
AST: 62 U/L — AB (ref 15–41)
Albumin: 2.8 g/dL — ABNORMAL LOW (ref 3.5–5.0)
Alkaline Phosphatase: 150 U/L — ABNORMAL HIGH (ref 38–126)
Anion gap: 11 (ref 5–15)
BUN: 29 mg/dL — AB (ref 6–20)
CALCIUM: 9 mg/dL (ref 8.9–10.3)
CHLORIDE: 95 mmol/L — AB (ref 101–111)
CO2: 25 mmol/L (ref 22–32)
CREATININE: 0.9 mg/dL (ref 0.44–1.00)
GFR calc Af Amer: >60 mL/min (ref >60)
GFR, EST NON AFRICAN AMERICAN: 56 mL/min — AB (ref >60)
Glucose, Bld: 120 mg/dL — ABNORMAL HIGH (ref 65–99)
Potassium: 4.9 mmol/L (ref 3.5–5.1)
SODIUM: 131 mmol/L — AB (ref 135–145)
Total Bilirubin: 1.3 mg/dL — ABNORMAL HIGH (ref 0.3–1.2)
Total Protein: 7.7 g/dL (ref 6.5–8.1)

## 2017-02-02 LAB — CBC
HCT: 32.5 % — ABNORMAL LOW (ref 36.0–46.0)
HEMOGLOBIN: 10.9 g/dL — AB (ref 12.0–15.0)
MCH: 27.9 pg (ref 26.0–34.0)
MCHC: 33.5 g/dL (ref 30.0–36.0)
MCV: 83.1 fL (ref 78.0–100.0)
PLATELETS: 582 10*3/uL — AB (ref 150–400)
RBC: 3.91 MIL/uL (ref 3.87–5.11)
RDW: 15 % (ref 11.5–15.5)
WBC: 22.6 10*3/uL — AB (ref 4.0–10.5)

## 2017-02-02 LAB — LIPASE, BLOOD: Lipase: 25 U/L (ref 11–51)

## 2017-02-02 MED ORDER — SODIUM CHLORIDE 0.9 % IV BOLUS (SEPSIS)
1000.0000 mL | Freq: Once | INTRAVENOUS | Status: AC
  Administered 2017-02-02: 1000 mL via INTRAVENOUS

## 2017-02-02 MED ORDER — MORPHINE SULFATE (PF) 4 MG/ML IV SOLN
4.0000 mg | Freq: Once | INTRAVENOUS | Status: AC
  Administered 2017-02-02: 4 mg via INTRAVENOUS
  Filled 2017-02-02: qty 1

## 2017-02-02 MED ORDER — IOPAMIDOL (ISOVUE-300) INJECTION 61%
100.0000 mL | Freq: Once | INTRAVENOUS | Status: AC | PRN
  Administered 2017-02-02: 100 mL via INTRAVENOUS

## 2017-02-02 MED ORDER — IOPAMIDOL (ISOVUE-300) INJECTION 61%
INTRAVENOUS | Status: AC
  Filled 2017-02-02: qty 100

## 2017-02-02 MED ORDER — HYDROCODONE-ACETAMINOPHEN 5-325 MG PO TABS
1.0000 | ORAL_TABLET | ORAL | Status: DC | PRN
  Administered 2017-02-05 – 2017-02-07 (×4): 1 via ORAL
  Filled 2017-02-02 (×4): qty 1

## 2017-02-02 MED ORDER — PIPERACILLIN-TAZOBACTAM 3.375 G IVPB
3.3750 g | Freq: Three times a day (TID) | INTRAVENOUS | Status: DC
  Administered 2017-02-03 – 2017-02-07 (×13): 3.375 g via INTRAVENOUS
  Filled 2017-02-02 (×15): qty 50

## 2017-02-02 MED ORDER — ACETAMINOPHEN 325 MG PO TABS: 650.0000 mg | ORAL_TABLET | Freq: Four times a day (QID) | ORAL | Status: DC | PRN

## 2017-02-02 MED ORDER — ONDANSETRON 4 MG PO TBDP: 4.0000 mg | ORAL_TABLET | Freq: Four times a day (QID) | ORAL | Status: DC | PRN

## 2017-02-02 MED ORDER — KCL IN DEXTROSE-NACL 20-5-0.45 MEQ/L-%-% IV SOLN
INTRAVENOUS | Status: DC
  Administered 2017-02-02 – 2017-02-03 (×3): via INTRAVENOUS
  Filled 2017-02-02 (×4): qty 1000

## 2017-02-02 MED ORDER — PANTOPRAZOLE SODIUM 40 MG IV SOLR
40.0000 mg | Freq: Every day | INTRAVENOUS | Status: DC
  Administered 2017-02-02 – 2017-02-05 (×4): 40 mg via INTRAVENOUS
  Filled 2017-02-02 (×4): qty 40

## 2017-02-02 MED ORDER — ONDANSETRON HCL 4 MG/2ML IJ SOLN
4.0000 mg | Freq: Once | INTRAMUSCULAR | Status: AC
  Administered 2017-02-02: 4 mg via INTRAVENOUS
  Filled 2017-02-02: qty 2

## 2017-02-02 MED ORDER — PIPERACILLIN-TAZOBACTAM 3.375 G IVPB 30 MIN
3.3750 g | Freq: Once | INTRAVENOUS | Status: AC
  Administered 2017-02-02: 3.375 g via INTRAVENOUS
  Filled 2017-02-02: qty 50

## 2017-02-02 MED ORDER — METRONIDAZOLE IN NACL 5-0.79 MG/ML-% IV SOLN
500.0000 mg | Freq: Once | INTRAVENOUS | Status: DC
  Filled 2017-02-02: qty 100

## 2017-02-02 MED ORDER — ACETAMINOPHEN 650 MG RE SUPP: 650.0000 mg | Freq: Four times a day (QID) | RECTAL | Status: DC | PRN

## 2017-02-02 MED ORDER — MORPHINE SULFATE (PF) 4 MG/ML IV SOLN
1.0000 mg | INTRAVENOUS | Status: DC | PRN
  Administered 2017-02-03: 1 mg via INTRAVENOUS
  Filled 2017-02-02: qty 1

## 2017-02-02 MED ORDER — ONDANSETRON HCL 4 MG/2ML IJ SOLN: 4.0000 mg | Freq: Four times a day (QID) | INTRAMUSCULAR | Status: DC | PRN

## 2017-02-02 NOTE — ED Provider Notes (Signed)
[place] DEPT Provider Note   CSN: [id number] Arrival date & time: 02/02/17  1318     History   Chief Complaint Chief Complaint  Patient presents with  . Abdominal Pain    HPI Charlene [name] is a 83 y.o. female.  HPI Patient is a 83 year old female status post sigmoidectomy and colovesicular fistula takedown.  She was operated on by Dr. Leighton Ruff and presents now with ongoing generalized abdominal pain, fatigue, generalized weakness.  Denies documented fever but reports chills.  Reports nausea vomiting over the past 48 hours and increasing abdominal pain.  Oral temp at the nursing facility found to be 100.2.  Past Medical History:  Diagnosis Date  . Acute renal failure ([place])   . Allergy    seasonal  . BPPV (benign paroxysmal positional vertigo) 2012  . Depression   . Diverticulitis   . Dyslipidemia   . GERD (gastroesophageal reflux disease)   . Hemorrhoids   . Macular degeneration   . Urine frequency   . Uterine carcinoma [facility])     Patient Active Problem List   Diagnosis Date Noted  . Colovesical fistula with cancer s/p colectomy/repair 01/14/2017 01/18/2017  . 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 Protein-calorie malnutrition, severe 02/01/2016  . Depression 01/31/2016  . GERD (gastroesophageal reflux disease) 01/31/2016  . Hemorrhoids 01/31/2016  . Acute diverticulitis 05/05/2015  . Abnormal LFTs 05/05/2015  . Dyslipidemia 03/13/2015  . Leukocytosis 03/13/2015  . Acute renal failure ([place]) 03/13/2015    Past Surgical History:  Procedure Laterality Date  . ABDOMINAL HYSTERECTOMY    . COLECTOMY N/A 01/14/2017   Procedure: OPEN SIGMOIDECTOMY WITH REPAIR OF COLOVESICULAR FISTULA;  Surgeon: Leighton Ruff, MD;  Location: [facility] ORS;  Service: General;  Laterality: N/A;  . EYE SURGERY     cataract removal bilateral 2012  . FLEXIBLE SIGMOIDOSCOPY N/A 01/11/2017   Procedure: FLEXIBLE SIGMOIDOSCOPY;  Surgeon: Leighton Ruff, MD;  Location: [facility] ENDOSCOPY;  Service: Endoscopy;  Laterality: N/A;  . TONSILLECTOMY      OB History    No data available       Home Medications    Prior to Admission medications   Medication Sig Start Date End Date Taking? Authorizing Provider  Carboxymethylcellul-Glycerin (LUBRICATING EYE DROPS OP) Apply 1 drop to eye daily as needed (dry eyes).   Yes Historical Provider, MD  docusate sodium (COLACE) 100 MG capsule Take 100 mg by mouth daily.   Yes Historical Provider, MD  famotidine (PEPCID AC) 10 MG chewable tablet Chew 2 tablets (20 mg total) by mouth at bedtime as needed for heartburn. 01/20/17  Yes Earnstine Regal, PA-C  lactose free nutrition (BOOST) LIQD Take 237 mLs by mouth daily.   Yes Historical Provider, MD  mirtazapine (REMERON) 7.5 MG tablet Take 7.5 mg by mouth at bedtime.   Yes Historical Provider, MD  Multiple Vitamins-Minerals (MULTIVITAMIN ADULT PO) Take 1 tablet by mouth daily.    Yes Historical Provider, MD  Multiple Vitamins-Minerals (PRESERVISION AREDS 2) CAPS Take 1 capsule by mouth daily.   Yes Historical Provider, MD  ondansetron (ZOFRAN) 4 MG tablet Take 4 mg by mouth every 6 (six) hours as needed for nausea or vomiting.   Yes Historical Provider, MD  protein supplement (RESOURCE BENEPROTEIN) 6 g POWD You can use whatever protein supplement that taste good to you.  Try to get 2-3 cans per day.  Do this till your ready to go home and take care of yourself. 01/20/17  Yes Earnstine Regal, PA-C  traMADol (ULTRAM) 50 MG tablet Take 1-2 tablets (50-100 mg total) by mouth every 6 (six) hours as needed for moderate pain or severe pain. 01/20/17  Yes Earnstine Regal, PA-C  triamcinolone (NASACORT AQ) 55 MCG/ACT AERO nasal inhaler Place 2 sprays into the nose daily as needed (for allergies).   Yes Historical Provider, MD  acetaminophen (TYLENOL) 500 MG tablet Take 500 mg by mouth every 6 (six) hours as needed for moderate pain.     Historical Provider, MD     Family History Family History  Problem Relation Age of Onset  . Heart disease Mother   . Diabetes Mother   . Stroke Father   . Diabetes Father   . Cancer Brother     lung cancer  . Heart disease Brother     Social History Social History  Substance Use Topics  . Smoking status: Former Smoker    Quit date: 01/19/1973  . Smokeless tobacco: Never Used  . Alcohol use Yes     Comment: occ     Allergies   Patient has no known allergies.   Review of Systems Review of Systems  All other systems reviewed and are negative.    Physical Exam Updated Vital Signs BP (!) 144/51 (BP Location: Left Arm)   Pulse (!) 56   Resp 18   Ht 5\' 4"  (1.626 m)   Wt 135 lb (61.2 kg)   SpO2 96%   BMI 23.17 kg/m   Physical Exam  Constitutional: She is oriented to person, place, and time.  Chronically ill-appearing.  Frail  HENT:  Head: Normocephalic and atraumatic.  Eyes: EOM are normal.  Neck: Normal range of motion.  Cardiovascular: Normal rate, regular rhythm and normal heart sounds.   Pulmonary/Chest: Effort normal and breath sounds normal.  Abdominal: Soft. She exhibits no distension.  Healed infraumbilical midline scar consistent with prior sigmoidectomy.  Mild generalized lower abdominal tenderness without guarding or rebound  Musculoskeletal: Normal range of motion.  Neurological: She is alert and oriented to person, place, and time.  Skin: Skin is warm and dry.  Psychiatric: She has a normal mood and affect. Judgment normal.  Nursing note and vitals reviewed.    ED Treatments / Results  Labs (all labs ordered are listed, but only abnormal results are displayed) Labs Reviewed  CBC - Abnormal; Notable for the following:       Result Value   WBC 22.6 (*)    Hemoglobin 10.9 (*)    HCT 32.5 (*)    Platelets 582 (*)    All other components within normal limits  COMPREHENSIVE METABOLIC PANEL - Abnormal; Notable for the following:    Sodium 131 (*)    Chloride 95 (*)     Glucose, Bld 120 (*)    BUN 29 (*)    Albumin 2.8 (*)    AST 62 (*)    Alkaline Phosphatase 150 (*)    Total Bilirubin 1.3 (*)    GFR calc non Af Amer 56 (*)    All other components within normal limits  LIPASE, BLOOD  URINALYSIS, ROUTINE W REFLEX MICROSCOPIC    EKG  EKG Interpretation None       Radiology No results found.  Procedures Procedures (including critical care time)  Medications Ordered in ED Medications  iopamidol (ISOVUE-300) 61 % injection (not administered)  sodium chloride 0.9 % bolus 1,000 mL (1,000 mLs Intravenous New Bag/Given 02/02/17 1520)  morphine 4 MG/ML injection 4 mg (  4 mg Intravenous Given 02/02/17 1531)  ondansetron (ZOFRAN) injection 4 mg (4 mg Intravenous Given 02/02/17 1556)     Initial Impression / Assessment and Plan / ED Course  I have reviewed the triage vital signs and the nursing notes.  Pertinent labs & imaging results that were available during my care of the patient were reviewed by me and considered in my medical decision making (see chart for details).     Patient will undergo CT imaging to further evaluate cause of her lower abdominal pain.  Concern for possible lower abdominal infection/abscess.  Urine pending.  Care to Dr Lita Mains to follow up on CT imaging  Final Clinical Impressions(s) / ED Diagnoses   Final diagnoses:  None    New Prescriptions New Prescriptions   No medications on file     Jola Schmidt, MD 02/02/17 1650

## 2017-02-02 NOTE — ED Triage Notes (Signed)
Per Ems: Coming from [facility] home Abdominal surgery here 2/3 weeks ago, since then she has been at a rehab facility. Has had N/V/D and increased abdominal pain.   100.2 oral temperature  Give tylenol time unknown BUN,   AXO 4  HR 65 BP  O2 90's on RA [place] at 4L went up to 99 152/66 RR 30

## 2017-02-02 NOTE — ED Notes (Signed)
Bed: EU99 Expected date:  Expected time:  Means of arrival:  Comments: EMS elderly, abd pain, fever

## 2017-02-02 NOTE — ED Notes (Signed)
Patient transported to CT 

## 2017-02-02 NOTE — H&P (Signed)
Charlene [name] is an 83 y.o. female.    General Surgery [facility]  Chief Complaint: abdominal pain, nausea & emesis, leukocytosis, pelvic abscess  HPI: patient is an 83 yo WF status post sigmoid colectomy for colovesical fistula on 05/15/5725 by Dr. Leighton Ruff.  Discharged to SNF.  Past 4-5 days with loss of appetite, nausea, emesis, and abdominal pain.  To ER today.  WBC 22K.  CTA positive for pelvic abscess and secondary SBO.  General surgery called to evaluate and manage.  Past Medical History:  Diagnosis Date  . Acute renal failure ([place])   . Allergy    seasonal  . BPPV (benign paroxysmal positional vertigo) 2012  . Depression   . Diverticulitis   . Dyslipidemia   . GERD (gastroesophageal reflux disease)   . Hemorrhoids   . Macular degeneration   . Urine frequency   . Uterine carcinoma [facility])     Past Surgical History:  Procedure Laterality Date  . ABDOMINAL HYSTERECTOMY    . COLECTOMY N/A 01/14/2017   Procedure: OPEN SIGMOIDECTOMY WITH REPAIR OF COLOVESICULAR FISTULA;  Surgeon: Leighton Ruff, MD;  Location: [facility]S;  Service: General;  Laterality: N/A;  . EYE SURGERY     cataract removal bilateral 2012  . FLEXIBLE SIGMOIDOSCOPY N/A 01/11/2017   Procedure: FLEXIBLE SIGMOIDOSCOPY;  Surgeon: Leighton Ruff, MD;  Location[facility] END[facility];  Service: Endoscopy;  Laterality: N/A;  . TONSILLECTOMY      Family History  Problem Relation Age of Onset  . Heart disease Mother   . Diabetes Mother   . Stroke Father   . Diabetes Father   . Cancer Brother     lung cancer  . Heart disease Brother    Social History:  reports that she quit smoking about 44 years ago. She has never used smokeless tobacco. She reports that she drinks alcohol. She reports that she does not use drugs.  Allergies: No Known Allergies   (Not in a hospital admission)  Results for orders placed or performed during the hospital encounter of 02/02/17 (from the past 48 hour(s))  CBC      Status: Abnormal   Collection Time: 02/02/17  3:17 PM  Result Value Ref Range   WBC 22.6 (H) 4.0 - 10.5 K/uL   RBC 3.91 3.87 - 5.11 MIL/uL   Hemoglobin 10.9 (L) 12.0 - 15.0 g/dL   HCT 32.5 (L) 36.0 - 46.0 %   MCV 83.1 78.0 - 100.0 fL   MCH 27.9 26.0 - 34.0 pg   MCHC 33.5 30.0 - 36.0 g/dL   RDW 15.0 11.5 - 15.5 %   Platelets 582 (H) 150 - 400 K/uL  Comprehensive metabolic panel     Status: Abnormal   Collection Time: 02/02/17  3:17 PM  Result Value Ref Range   Sodium 131 (L) 135 - 145 mmol/L   Potassium 4.9 3.5 - 5.1 mmol/L   Chloride 95 (L) 101 - 111 mmol/L   CO2 25 22 - 32 mmol/L   Glucose, Bld 120 (H) 65 - 99 mg/dL   BUN 29 (H) 6 - 20 mg/dL   Creatinine, Ser 0.90 0.44 - 1.00 mg/dL   Calcium 9.0 8.9 - 10.3 mg/dL   Total Protein 7.7 6.5 - 8.1 g/dL   Albumin 2.8 (L) 3.5 - 5.0 g/dL   AST 62 (H) 15 - 41 U/L   ALT 46 14 - 54 U/L   Alkaline Phosphatase 150 (H) 38 - 126 U/L   Total Bilirubin  1.3 (H) 0.3 - 1.2 mg/dL   GFR calc non Af Amer 56 (L) >60 mL/min   GFR calc Af Amer >60 >60 mL/min    Comment: (NOTE) The eGFR has been calculated using the CKD EPI equation. This calculation has not been validated in all clinical situations. eGFR's persistently <60 mL/min signify possible Chronic Kidney Disease.    Anion gap 11 5 - 15  Lipase, blood     Status: None   Collection Time: 02/02/17  3:17 PM  Result Value Ref Range   Lipase 25 11 - 51 U/L  Urinalysis, Routine w reflex microscopic     Status: Abnormal   Collection Time: 02/02/17  5:52 PM  Result Value Ref Range   Color, Urine AMBER (A) YELLOW    Comment: BIOCHEMICALS MAY BE AFFECTED BY COLOR   APPearance HAZY (A) CLEAR   Specific Gravity, Urine 1.024 1.005 - 1.030   pH 5.0 5.0 - 8.0   Glucose, UA NEGATIVE NEGATIVE mg/dL   Hgb urine dipstick NEGATIVE NEGATIVE   Bilirubin Urine SMALL (A) NEGATIVE   Ketones, ur 5 (A) NEGATIVE mg/dL   Protein, ur 30 (A) NEGATIVE mg/dL   Nitrite NEGATIVE NEGATIVE   Leukocytes, UA SMALL (A)  NEGATIVE   RBC / HPF 0-5 0 - 5 RBC/hpf   WBC, UA 6-30 0 - 5 WBC/hpf   Bacteria, UA NONE SEEN NONE SEEN   Squamous Epithelial / LPF 0-5 (A) NONE SEEN   Mucous PRESENT    Budding Yeast PRESENT    Non Squamous Epithelial 0-5 (A) NONE SEEN   Ct Abdomen Pelvis W Contrast  Result Date: 02/02/2017 CLINICAL DATA:  Generalized abdominal pain and fatigue status post sigmoidectomy and colovesicular fistula takedown. Low grade fever to 100.2 in the nursing home. Reports nausea and vomiting over the past 48 hours. EXAM: CT ABDOMEN AND PELVIS WITH CONTRAST TECHNIQUE: Multidetector CT imaging of the abdomen and pelvis was performed using the standard protocol following bolus administration of intravenous contrast. CONTRAST:  122m ISOVUE-300 IOPAMIDOL (ISOVUE-300) INJECTION 61% COMPARISON:  12/22/2016 FINDINGS: Lower chest: Stable cardiomegaly without pericardial effusion. Subsegmental atelectasis in both lower lobes with trace right effusion. Hepatobiliary: 7 mm circumscribed hypodensity in the left hepatic lobe is unchanged statistically consistent with a cyst but too small to further characterize. No other suspicious hepatic lesions. No intra nor extrahepatic ductal dilatation. Gallstones are present along the dependent wall of the fundus. No evidence of acute cholecystitis. Pancreas: Unremarkable. No pancreatic ductal dilatation or surrounding inflammatory changes. Spleen: Normal in size without focal abnormality. Adrenals/Urinary Tract: Normal bilateral adrenal glands unstable water attenuating cysts both kidneys, the largest is in the upper pole the left kidney measuring 3 cm in diameter within interpolar right renal cyst measuring 1 cm. The urinary bladder is physiologically distended without focal mass or calculus. Tiny focus of air along the nondependent wall is noted within possibly from instrumentation. No definite evidence of fistulous recurrence. Stomach/Bowel: The stomach is contracted in appearance. There  is normal small bowel rotation. Dilated fluid and air-filled small bowel loops are seen from the third portion of the duodenum through distal ileum were there is a transition point adjacent to what appears to be an abscess measuring 4.1 x 5.5 x 6.7 cm involving the right iliacus and psoas muscles, series 2, image 61. The terminal ileum is decompressed. Stool is noted within large bowel. The patient has undergone sigmoidectomy with new surgical clips noted in the left lower hemipelvis. Mild presacral edema and induration is  noted posterior to the rectum which may reflect postop change. Vascular/Lymphatic: There is aortic atherosclerosis without evidence of aneurysm or dissection. No lymphadenopathy. Reproductive: Status post hysterectomy. Bilateral salpingo-oophorectomy. Other: No pneumoperitoneum. Musculoskeletal: No aggressive appearing lytic or blastic disease. IMPRESSION: 1. Enhancing hypodense abscess collection involving the right iliacus and psoas muscles measuring 4.1 x 5.5 x 6.7 cm. This appears to be contributing to a new small bowel obstruction possibly from inflammation and adhesions involving the adjacent distal ileum within the right hemipelvis. Mild presacral edema is noted which may be secondary to prior surgery and intervention. 2. Uncomplicated cholelithiasis. 3. Stable hepatic and renal cysts. 4. Aortic and branch vessel atherosclerosis. Electronically Signed   By: Ashley Royalty M.D.   On: 02/02/2017 17:39    Review of Systems  Constitutional: Positive for malaise/fatigue and weight loss. Negative for chills and fever.  HENT: Negative.   Eyes: Negative.   Respiratory: Negative.   Cardiovascular: Negative.   Gastrointestinal: Positive for abdominal pain, nausea and vomiting.  Genitourinary: Negative.   Musculoskeletal: Negative.   Skin: Negative.   Neurological: Positive for weakness.  Endo/Heme/Allergies: Negative.   Psychiatric/Behavioral: Negative.     Blood pressure (!) 163/56,  pulse 64, temperature 99.8 F (37.7 C), temperature source Rectal, resp. rate 18, height 5' 4"  (1.626 m), weight 61.2 kg (135 lb), SpO2 98 %. Physical Exam  Constitutional: She is oriented to person, place, and time.  Weak, lethargic, somewhat cachectic; family at bedside  HENT:  Head: Normocephalic and atraumatic.  Right Ear: External ear normal.  Left Ear: External ear normal.  Mouth/Throat: No oropharyngeal exudate.  Eyes: Conjunctivae are normal. Pupils are equal, round, and reactive to light. No scleral icterus.  Neck: Normal range of motion. Neck supple. No tracheal deviation present. No thyromegaly present.  Cardiovascular: Normal rate, regular rhythm and normal heart sounds.   No murmur heard. Respiratory: Effort normal and breath sounds normal. No respiratory distress. She has no wheezes.  GI: She exhibits distension (mild). She exhibits no mass. There is tenderness (diffuse). There is no rebound and no guarding.  Healing midline incision without complication  Musculoskeletal: Normal range of motion. She exhibits no edema or tenderness.  Neurological: She is alert and oriented to person, place, and time.  Skin: Skin is warm and dry.  Psychiatric: She has a normal mood and affect. Her behavior is normal.     Assessment/Plan Pelvic abscess following sigmoid colectomy Small bowel obstruction Dehydration  Admit to general surgery service (Dr. Marcello Moores is on vacation)  IV hydration  IV Zosyn  NPO except for ice chips - hold off on NG tube for now  Will discuss with IR in AM 5/2 and arrange for drainage procedure  Discussed with patient and family at [place], MD, [facility] Office: [place], MD 02/02/2017, 7:12 PM

## 2017-02-02 NOTE — ED Provider Notes (Signed)
Taken in signout from Dr. Venora Maples. CT reveals abscess collection with associated small bowel obstruction. Discussed results with patient and family member. Will start on IV Zosyn. Dr. Tera Helper will admit for general surgery.   Julianne Rice, MD 02/02/17 [phone]

## 2017-02-02 NOTE — ED Notes (Signed)
Will take pt up in 20 min. Report given to the nurse.

## 2017-02-03 ENCOUNTER — Inpatient Hospital Stay (HOSPITAL_COMMUNITY): Payer: Medicare Other

## 2017-02-03 ENCOUNTER — Encounter (HOSPITAL_COMMUNITY): Payer: Self-pay | Admitting: General Surgery

## 2017-02-03 LAB — BASIC METABOLIC PANEL
Anion gap: 9 (ref 5–15)
BUN: 20 mg/dL (ref 6–20)
CO2: 22 mmol/L (ref 22–32)
CREATININE: 0.77 mg/dL (ref 0.44–1.00)
Calcium: 8.1 mg/dL — ABNORMAL LOW (ref 8.9–10.3)
Chloride: 101 mmol/L (ref 101–111)
GFR calc Af Amer: >60 mL/min (ref >60)
GLUCOSE: 162 mg/dL — AB (ref 65–99)
POTASSIUM: 4.3 mmol/L (ref 3.5–5.1)
SODIUM: 132 mmol/L — AB (ref 135–145)

## 2017-02-03 LAB — PROTIME-INR
INR: 1.38
Prothrombin Time: 17 seconds — ABNORMAL HIGH (ref 11.4–15.2)

## 2017-02-03 LAB — APTT: APTT: 38 s — AB (ref 24–36)

## 2017-02-03 LAB — CBC
HCT: 26.2 % — ABNORMAL LOW (ref 36.0–46.0)
Hemoglobin: 8.6 g/dL — ABNORMAL LOW (ref 12.0–15.0)
MCH: 27.4 pg (ref 26.0–34.0)
MCHC: 32.8 g/dL (ref 30.0–36.0)
MCV: 83.4 fL (ref 78.0–100.0)
Platelets: 501 10*3/uL — ABNORMAL HIGH (ref 150–400)
RBC: 3.14 MIL/uL — ABNORMAL LOW (ref 3.87–5.11)
RDW: 15.2 % (ref 11.5–15.5)
WBC: 20.8 10*3/uL — ABNORMAL HIGH (ref 4.0–10.5)

## 2017-02-03 MED ORDER — FENTANYL CITRATE (PF) 100 MCG/2ML IJ SOLN
INTRAMUSCULAR | Status: AC | PRN
  Administered 2017-02-03: 25 ug via INTRAVENOUS

## 2017-02-03 MED ORDER — MIDAZOLAM HCL 2 MG/2ML IJ SOLN
INTRAMUSCULAR | Status: AC | PRN
  Administered 2017-02-03: 1 mg via INTRAVENOUS

## 2017-02-03 MED ORDER — LIDOCAINE-EPINEPHRINE (PF) 1 %-1:200000 IJ SOLN
INTRAMUSCULAR | Status: AC | PRN
  Administered 2017-02-03: 30 mL via INTRADERMAL

## 2017-02-03 MED ORDER — MIDAZOLAM HCL 2 MG/2ML IJ SOLN
INTRAMUSCULAR | Status: AC
  Filled 2017-02-03: qty 2

## 2017-02-03 MED ORDER — TRIAMCINOLONE ACETONIDE 55 MCG/ACT NA AERO
2.0000 | INHALATION_SPRAY | Freq: Every day | NASAL | Status: DC | PRN
  Filled 2017-02-03 (×3): qty 21.6

## 2017-02-03 MED ORDER — FENTANYL CITRATE (PF) 100 MCG/2ML IJ SOLN
INTRAMUSCULAR | Status: AC
  Filled 2017-02-03: qty 2

## 2017-02-03 NOTE — Consult Note (Signed)
Chief Complaint: right pelvic abscess  Referring Physician:Dr. Jackolyn Confer  Supervising Physician: Sandi Mariscal  Patient Status: [facility] - In-pt  HPI: Charlene [name] is a 83 y.o. female who underwent a sigmoid colectomy and a takedown of a colovesical fistula by Dr. Marcello Moores on 01-14-17.  She went to [facility] for rehab after this procedure.  She states a couple of days ago she began to have more abdominal pain, specifically in the RLQ.  She started having some low grade fevers.  She was brought to the [facility] yesterday where she had a CT scan that revealed an enhancing hypodense abscess collection involving the right iliacus and psoas muscles measuring 4.1 x 5.5 x 6.7 cm.  She also has a WBC of 20K.  We have been asked to evaluate the patient for placement of a drain.  Past Medical History:  Past Medical History:  Diagnosis Date  . Acute renal failure ([place])   . Allergy    seasonal  . BPPV (benign paroxysmal positional vertigo) 2012  . Depression   . Diverticulitis   . Dyslipidemia   . GERD (gastroesophageal reflux disease)   . Hemorrhoids   . Macular degeneration   . Urine frequency   . Uterine carcinoma [facility])     Past Surgical History:  Past Surgical History:  Procedure Laterality Date  . ABDOMINAL HYSTERECTOMY    . COLECTOMY N/A 01/14/2017   Procedure: OPEN SIGMOIDECTOMY WITH REPAIR OF COLOVESICULAR FISTULA;  Surgeon: Leighton Ruff, MD;  Location: [facility] ORS;  Service: General;  Laterality: N/A;  . EYE SURGERY     cataract removal bilateral 2012  . FLEXIBLE SIGMOIDOSCOPY N/A 01/11/2017   Procedure: FLEXIBLE SIGMOIDOSCOPY;  Surgeon: Leighton Ruff, MD;  Location: [facility] ENDOSCOPY;  Service: Endoscopy;  Laterality: N/A;  . TONSILLECTOMY      Family History:  Family History  Problem Relation Age of Onset  . Heart disease Mother   . Diabetes Mother   . Stroke Father   . Diabetes Father   . Cancer Brother     lung cancer  . Heart disease Brother     Social History:  reports that  she quit smoking about 44 years ago. She has never used smokeless tobacco. She reports that she drinks alcohol. She reports that she does not use drugs.  Allergies: No Known Allergies  Medications: Medications reviewed in epic  Please HPI for pertinent positives, otherwise complete 10 system ROS negative.  Mallampati Score: MD Evaluation Airway: WNL Heart: WNL Abdomen: Other (comments) Abdomen comments: healing midline incision Chest/ Lungs: WNL ASA  Classification: 3 Mallampati/Airway Score: Two  Physical Exam: BP (!) 148/50 (BP Location: Left Arm)   Pulse (!) 59   Temp 98.2 F (36.8 C) (Oral)   Resp 15   Ht 5' 4"  (1.626 m)   Wt 135 lb 5.8 oz (61.4 kg)   SpO2 96%   BMI 23.23 kg/m  Body mass index is 23.23 kg/m. General: pleasant, elderly white female who is laying in bed in NAD HEENT: head is normocephalic, atraumatic.  Sclera are noninjected.  PERRL.  Ears and nose without any masses or lesions.  Mouth is pink and moist Heart: regular, rate, and rhythm.  Normal s1,s2. No obvious murmurs, gallops, or rubs noted.  Palpable radial pulses bilaterally Lungs: CTAB, no wheezes, rhonchi, or rales noted.  Respiratory effort nonlabored Abd: soft, tender in the RLQ, ND, +BS, no masses, hernias, or organomegaly.  Healing midline wound Psych: A&Ox3 with an appropriate affect.  Labs: Results for orders placed or performed during the hospital encounter of 02/02/17 (from the past 48 hour(s))  CBC     Status: Abnormal   Collection Time: 02/02/17  3:17 PM  Result Value Ref Range   WBC 22.6 (H) 4.0 - 10.5 K/uL   RBC 3.91 3.87 - 5.11 MIL/uL   Hemoglobin 10.9 (L) 12.0 - 15.0 g/dL   HCT 32.5 (L) 36.0 - 46.0 %   MCV 83.1 78.0 - 100.0 fL   MCH 27.9 26.0 - 34.0 pg   MCHC 33.5 30.0 - 36.0 g/dL   RDW 15.0 11.5 - 15.5 %   Platelets 582 (H) 150 - 400 K/uL  Comprehensive metabolic panel     Status: Abnormal   Collection Time: 02/02/17  3:17 PM  Result Value Ref Range   Sodium 131 (L) 135  - 145 mmol/L   Potassium 4.9 3.5 - 5.1 mmol/L   Chloride 95 (L) 101 - 111 mmol/L   CO2 25 22 - 32 mmol/L   Glucose, Bld 120 (H) 65 - 99 mg/dL   BUN 29 (H) 6 - 20 mg/dL   Creatinine, Ser 0.90 0.44 - 1.00 mg/dL   Calcium 9.0 8.9 - 10.3 mg/dL   Total Protein 7.7 6.5 - 8.1 g/dL   Albumin 2.8 (L) 3.5 - 5.0 g/dL   AST 62 (H) 15 - 41 U/L   ALT 46 14 - 54 U/L   Alkaline Phosphatase 150 (H) 38 - 126 U/L   Total Bilirubin 1.3 (H) 0.3 - 1.2 mg/dL   GFR calc non Af Amer 56 (L) >60 mL/min   GFR calc Af Amer >60 >60 mL/min    Comment: (NOTE) The eGFR has been calculated using the CKD EPI equation. This calculation has not been validated in all clinical situations. eGFR's persistently <60 mL/min signify possible Chronic Kidney Disease.    Anion gap 11 5 - 15  Lipase, blood     Status: None   Collection Time: 02/02/17  3:17 PM  Result Value Ref Range   Lipase 25 11 - 51 U/L  Urinalysis, Routine w reflex microscopic     Status: Abnormal   Collection Time: 02/02/17  5:52 PM  Result Value Ref Range   Color, Urine AMBER (A) YELLOW    Comment: BIOCHEMICALS MAY BE AFFECTED BY COLOR   APPearance HAZY (A) CLEAR   Specific Gravity, Urine 1.024 1.005 - 1.030   pH 5.0 5.0 - 8.0   Glucose, UA NEGATIVE NEGATIVE mg/dL   Hgb urine dipstick NEGATIVE NEGATIVE   Bilirubin Urine SMALL (A) NEGATIVE   Ketones, ur 5 (A) NEGATIVE mg/dL   Protein, ur 30 (A) NEGATIVE mg/dL   Nitrite NEGATIVE NEGATIVE   Leukocytes, UA SMALL (A) NEGATIVE   RBC / HPF 0-5 0 - 5 RBC/hpf   WBC, UA 6-30 0 - 5 WBC/hpf   Bacteria, UA NONE SEEN NONE SEEN   Squamous Epithelial / LPF 0-5 (A) NONE SEEN   Mucous PRESENT    Budding Yeast PRESENT    Non Squamous Epithelial 0-5 (A) NONE SEEN  CBC     Status: Abnormal   Collection Time: 02/03/17  8:14 AM  Result Value Ref Range   WBC 20.8 (H) 4.0 - 10.5 K/uL   RBC 3.14 (L) 3.87 - 5.11 MIL/uL   Hemoglobin 8.6 (L) 12.0 - 15.0 g/dL    Comment: REPEATED TO VERIFY DELTA CHECK NOTED    HCT  26.2 (L) 36.0 - 46.0 %   MCV 83.4  78.0 - 100.0 fL   MCH 27.4 26.0 - 34.0 pg   MCHC 32.8 30.0 - 36.0 g/dL   RDW 15.2 11.5 - 15.5 %   Platelets 501 (H) 150 - 400 K/uL  Basic metabolic panel     Status: Abnormal   Collection Time: 02/03/17  8:14 AM  Result Value Ref Range   Sodium 132 (L) 135 - 145 mmol/L   Potassium 4.3 3.5 - 5.1 mmol/L   Chloride 101 101 - 111 mmol/L   CO2 22 22 - 32 mmol/L   Glucose, Bld 162 (H) 65 - 99 mg/dL   BUN 20 6 - 20 mg/dL   Creatinine, Ser 0.77 0.44 - 1.00 mg/dL   Calcium 8.1 (L) 8.9 - 10.3 mg/dL   GFR calc non Af Amer >60 >60 mL/min   GFR calc Af Amer >60 >60 mL/min    Comment: (NOTE) The eGFR has been calculated using the CKD EPI equation. This calculation has not been validated in all clinical situations. eGFR's persistently <60 mL/min signify possible Chronic Kidney Disease.    Anion gap 9 5 - 15  Protime-INR     Status: Abnormal   Collection Time: 02/03/17 10:05 AM  Result Value Ref Range   Prothrombin Time 17.0 (H) 11.4 - 15.2 seconds   INR 1.38     Imaging: Ct Abdomen Pelvis W Contrast  Result Date: 02/02/2017 CLINICAL DATA:  Generalized abdominal pain and fatigue status post sigmoidectomy and colovesicular fistula takedown. Low grade fever to 100.2 in the nursing home. Reports nausea and vomiting over the past 48 hours. EXAM: CT ABDOMEN AND PELVIS WITH CONTRAST TECHNIQUE: Multidetector CT imaging of the abdomen and pelvis was performed using the standard protocol following bolus administration of intravenous contrast. CONTRAST:  150m ISOVUE-300 IOPAMIDOL (ISOVUE-300) INJECTION 61% COMPARISON:  12/22/2016 FINDINGS: Lower chest: Stable cardiomegaly without pericardial effusion. Subsegmental atelectasis in both lower lobes with trace right effusion. Hepatobiliary: 7 mm circumscribed hypodensity in the left hepatic lobe is unchanged statistically consistent with a cyst but too small to further characterize. No other suspicious hepatic lesions. No  intra nor extrahepatic ductal dilatation. Gallstones are present along the dependent wall of the fundus. No evidence of acute cholecystitis. Pancreas: Unremarkable. No pancreatic ductal dilatation or surrounding inflammatory changes. Spleen: Normal in size without focal abnormality. Adrenals/Urinary Tract: Normal bilateral adrenal glands unstable water attenuating cysts both kidneys, the largest is in the upper pole the left kidney measuring 3 cm in diameter within interpolar right renal cyst measuring 1 cm. The urinary bladder is physiologically distended without focal mass or calculus. Tiny focus of air along the nondependent wall is noted within possibly from instrumentation. No definite evidence of fistulous recurrence. Stomach/Bowel: The stomach is contracted in appearance. There is normal small bowel rotation. Dilated fluid and air-filled small bowel loops are seen from the third portion of the duodenum through distal ileum were there is a transition point adjacent to what appears to be an abscess measuring 4.1 x 5.5 x 6.7 cm involving the right iliacus and psoas muscles, series 2, image 61. The terminal ileum is decompressed. Stool is noted within large bowel. The patient has undergone sigmoidectomy with new surgical clips noted in the left lower hemipelvis. Mild presacral edema and induration is noted posterior to the rectum which may reflect postop change. Vascular/Lymphatic: There is aortic atherosclerosis without evidence of aneurysm or dissection. No lymphadenopathy. Reproductive: Status post hysterectomy. Bilateral salpingo-oophorectomy. Other: No pneumoperitoneum. Musculoskeletal: No aggressive appearing lytic or blastic disease. IMPRESSION: 1.  Enhancing hypodense abscess collection involving the right iliacus and psoas muscles measuring 4.1 x 5.5 x 6.7 cm. This appears to be contributing to a new small bowel obstruction possibly from inflammation and adhesions involving the adjacent distal ileum  within the right hemipelvis. Mild presacral edema is noted which may be secondary to prior surgery and intervention. 2. Uncomplicated cholelithiasis. 3. Stable hepatic and renal cysts. 4. Aortic and branch vessel atherosclerosis. Electronically Signed   By: Ashley Royalty M.D.   On: 02/02/2017 17:39    Assessment/Plan 1. Pelvic abscess, s/p sigmoid colectomy with takedown of colovesical fistula on 01-14-17 The patient's imaging has been reviewed and is amendable to placement of a percutaneous drain.  She has been NPO.  Her labs have been reviewed and are appropriate to move forward. Risks and Benefits discussed with the patient including bleeding, infection, damage to adjacent structures, bowel perforation/fistula connection, and sepsis. All of the patient's questions were answered, patient is agreeable to proceed. Consent signed and in chart.   Thank you for this interesting consult.  I greatly enjoyed meeting Charlene [name] and look forward to participating in their care.  A copy of this report was sent to the requesting provider on this date.  Electronically Signed: Henreitta Cea 02/03/2017, 10:34 AM   I spent a total of 40 Minutes    in face to face in clinical consultation, greater than 50% of which was counseling/coordinating care for pelvic abscess

## 2017-02-03 NOTE — Procedures (Signed)
Pre procedural Dx: Abdominal/Pelvic Abscess Post procedural Dx: Same  Technically successful CT guided placed of a 10 Fr drainage catheter placement into the right lower abdomen/pelvis yielding 75 cc of purulent appearing fluid.    All aspirated samples sent to the laboratory for analysis.    EBL: Minimal  Complications: None immediate  Ronny Bacon, MD Pager #: [phone]

## 2017-02-03 NOTE — Progress Notes (Addendum)
Initial Nutrition Assessment  DOCUMENTATION CODES:   Severe malnutrition in context of acute illness/injury  INTERVENTION:    Advance diet as medically appropriate.  Add supplements when/as able.  NUTRITION DIAGNOSIS:   Malnutrition (severe) related to acute illness (SBO, pelvic abscess ) as evidenced by severe depletion of body fat, severe depletion of muscle mass  GOAL:   Patient will meet greater than or equal to 90% of their needs  MONITOR:   Diet advancement, PO intake, Supplement acceptance, Labs, Weight trends, I & O's  REASON FOR ASSESSMENT:   Malnutrition Screening Tool  ASSESSMENT:   83 y.o. Female who underwent a sigmoid colectomy and a takedown of a colovesical fistula by Dr. Marcello Moores on 01-14-17.  She went to [facility] for rehab after this procedure.  She states a couple of days ago she began to have more abdominal pain, specifically in the RLQ.  She started having some low grade fevers.  She was brought to the [facility] yesterday where she had a CT scan that revealed an enhancing hypodense abscess collection involving the right iliacus and psoas muscles measuring 4.1 x 5.5 x 6.7 cm.  She also has a WBC of 20K.  We have been asked to evaluate the patient for placement of a drain.  RD spoke with patient's son at bedside. Reports pt has had a poor appetite with nausea and vomiting PTA.  Was at [facility].   Has been progressively losing weight.  Son unable to quantify amount and/or time frame.  During previous admission was receiving and liked [employer]. Plan is for percutaneous drain placement for pelvic abscess.  Medications reviewed.  Labs reviewed.  Sodium 132 (L).  Nutrition-Focused physical exam completed. Findings are severe fat depletion, severemuscle depletion, and no edema.   Diet Order:  Diet NPO time specified Except for: Sips with Meds  Skin:  Reviewed, no issues  Last BM:  4/30  Height:   Ht Readings from Last 1 Encounters:  02/02/17 5\' 4"   (1.626 m)   Weight:   Wt Readings from Last 1 Encounters:  02/03/17 135 lb 5.8 oz (61.4 kg)   Ideal Body Weight:  54.5 kg  BMI:  Body mass index is 23.23 kg/m.  Estimated Nutritional Needs:   Kcal:  1600-1800  Protein:  70-80 gm  Fluid:  1.6-1.8 L  EDUCATION NEEDS:   No education needs identified at this time  Arthur Holms, RD, LDN Pager #: [phone] After-Hours Pager #: [phone]

## 2017-02-03 NOTE — Progress Notes (Addendum)
Patient has Medical Orders for Scope of Practice form, from facility, in chart. Per MOST form, patient wishes to be "Do Not Attempt Resuscitation" and wishes to have "No Antibiotics". Nurse spoke with patient. Patient verbalizes she understands a Do Not Attempt Resuscitation order and wishes to continue with the Do Not Attempt Resuscitation order during her stay here at [facility]. Patient also verbalizing that she does want to receive antibiotics during her stay at [facility].   Nurse paged Dr. Harlow Asa with [facility]. Dr. Harlow Asa is aware of patients wishes with Do Not Attempt Resuscitation and No Antibiotics according to the MOST form and patients  Verbalizations. Dr. Harlow Asa gave verbal order for Do Not Resuscitate. Nurse will enter order at this time.   Nurse returned page to Dr. Harlow Asa to allow second nurse, Tobie Poet, RN, to hear verbal order for Do Not Attempt Resuscitation. Dr. Harlow Asa returned call, spoke to Tobie Poet, RN and gave verbal order for Do Not Attempt Resuscitation.

## 2017-02-03 NOTE — Progress Notes (Signed)
CSW consulted to assist with d/c planning. Pt hospitalized from home on 01/11/17 and d/c to [facility] on 01/06/18/18 for ST Rehab. Pt hospitalized from [facility] on 02/02/17 with Pelvic abscess following sigmoid colectomy, SBO and dehydration. CSW met with pt /son at bedside to offer support and assistance with d/c planning. Pt is alert, oriented x4 and able to participate in d/c planning. Pt / son feel continued rehab will be needed at d/c and gave CSW permission to provide clinicals to [facility]. Pt / son would like pt to return to SNF at d/c. CSW will continue to follow to assist with d/c planning. Pt / son appreciate CSW assistance and support.  Werner Lean LCSW [phone]

## 2017-02-03 NOTE — Progress Notes (Signed)
Patient ID: Charlene [name], female   DOB: August 10, 1928, 83 y.o.   MRN: [id number]  [facility] Surgery Progress Note     Subjective: CC- pelvic abscess, abdominal pain Patient admitted over night with abdominal pain, nausea, vomiting, and decreased appetite. Found to have WBC 22 and on CT scan a pelvic abscess and secondary SBO. This morning states that she has persistent pain. Denies any current nausea or vomiting. Unsure when he last BM was.  Objective: Vital signs in last 24 hours: Temp:  [97.7 F (36.5 C)-99.8 F (37.7 C)] 97.7 F (36.5 C) (05/02 0450) Pulse Rate:  [53-64] 62 (05/02 0450) Resp:  [16-25] 16 (05/02 0450) BP: (136-163)/(45-56) 153/45 (05/02 0450) SpO2:  [95 %-98 %] 96 % (05/02 0450) Weight:  [135 lb (61.2 kg)-135 lb 5.8 oz (61.4 kg)] 135 lb 5.8 oz (61.4 kg) (05/02 0247) Last BM Date: 02/01/17  Intake/Output from previous day: 05/01 0701 - 05/02 0700 In: 50 [P.O.:50] Out: 575 [Urine:575] Intake/Output this shift: No intake/output data recorded.  PE: Gen:  Alert, NAD, pleasant Card:  RRR, no M/G/R heard Pulm:  CTAB, no W/R/R, effort normal, on [place] Abd: Soft, NT, well healed midline incision, +BS, TTP RLQ with no rebound or guarding Ext:  No erythema, edema, or tenderness BUE/BLE   Lab Results:   Recent Labs  02/02/17 1517  WBC 22.6*  HGB 10.9*  HCT 32.5*  PLT 582*   BMET  Recent Labs  02/02/17 1517  NA 131*  K 4.9  CL 95*  CO2 25  GLUCOSE 120*  BUN 29*  CREATININE 0.90  CALCIUM 9.0   PT/INR No results for input(s): LABPROT, INR in the last 72 hours. CMP     Component Value Date/Time   NA 131 (L) 02/02/2017 1517   K 4.9 02/02/2017 1517   CL 95 (L) 02/02/2017 1517   CO2 25 02/02/2017 1517   GLUCOSE 120 (H) 02/02/2017 1517   BUN 29 (H) 02/02/2017 1517   CREATININE 0.90 02/02/2017 1517   CALCIUM 9.0 02/02/2017 1517   PROT 7.7 02/02/2017 1517   ALBUMIN 2.8 (L) 02/02/2017 1517   AST 62 (H) 02/02/2017 1517   ALT 46 02/02/2017 1517    ALKPHOS 150 (H) 02/02/2017 1517   BILITOT 1.3 (H) 02/02/2017 1517   GFRNONAA 56 (L) 02/02/2017 1517   GFRAA >60 02/02/2017 1517   Lipase     Component Value Date/Time   LIPASE 25 02/02/2017 1517       Studies/Results: Ct Abdomen Pelvis W Contrast  Result Date: 02/02/2017 CLINICAL DATA:  Generalized abdominal pain and fatigue status post sigmoidectomy and colovesicular fistula takedown. Low grade fever to 100.2 in the nursing home. Reports nausea and vomiting over the past 48 hours. EXAM: CT ABDOMEN AND PELVIS WITH CONTRAST TECHNIQUE: Multidetector CT imaging of the abdomen and pelvis was performed using the standard protocol following bolus administration of intravenous contrast. CONTRAST:  161mL ISOVUE-300 IOPAMIDOL (ISOVUE-300) INJECTION 61% COMPARISON:  12/22/2016 FINDINGS: Lower chest: Stable cardiomegaly without pericardial effusion. Subsegmental atelectasis in both lower lobes with trace right effusion. Hepatobiliary: 7 mm circumscribed hypodensity in the left hepatic lobe is unchanged statistically consistent with a cyst but too small to further characterize. No other suspicious hepatic lesions. No intra nor extrahepatic ductal dilatation. Gallstones are present along the dependent wall of the fundus. No evidence of acute cholecystitis. Pancreas: Unremarkable. No pancreatic ductal dilatation or surrounding inflammatory changes. Spleen: Normal in size without focal abnormality. Adrenals/Urinary Tract: Normal bilateral adrenal glands unstable water  attenuating cysts both kidneys, the largest is in the upper pole the left kidney measuring 3 cm in diameter within interpolar right renal cyst measuring 1 cm. The urinary bladder is physiologically distended without focal mass or calculus. Tiny focus of air along the nondependent wall is noted within possibly from instrumentation. No definite evidence of fistulous recurrence. Stomach/Bowel: The stomach is contracted in appearance. There is normal  small bowel rotation. Dilated fluid and air-filled small bowel loops are seen from the third portion of the duodenum through distal ileum were there is a transition point adjacent to what appears to be an abscess measuring 4.1 x 5.5 x 6.7 cm involving the right iliacus and psoas muscles, series 2, image 61. The terminal ileum is decompressed. Stool is noted within large bowel. The patient has undergone sigmoidectomy with new surgical clips noted in the left lower hemipelvis. Mild presacral edema and induration is noted posterior to the rectum which may reflect postop change. Vascular/Lymphatic: There is aortic atherosclerosis without evidence of aneurysm or dissection. No lymphadenopathy. Reproductive: Status post hysterectomy. Bilateral salpingo-oophorectomy. Other: No pneumoperitoneum. Musculoskeletal: No aggressive appearing lytic or blastic disease. IMPRESSION: 1. Enhancing hypodense abscess collection involving the right iliacus and psoas muscles measuring 4.1 x 5.5 x 6.7 cm. This appears to be contributing to a new small bowel obstruction possibly from inflammation and adhesions involving the adjacent distal ileum within the right hemipelvis. Mild presacral edema is noted which may be secondary to prior surgery and intervention. 2. Uncomplicated cholelithiasis. 3. Stable hepatic and renal cysts. 4. Aortic and branch vessel atherosclerosis. Electronically Signed   By: Ashley Royalty M.D.   On: 02/02/2017 17:39    Anti-infectives: Anti-infectives    Start     Dose/Rate Route Frequency Ordered Stop   02/03/17 0200  piperacillin-tazobactam (ZOSYN) IVPB 3.375 g     3.375 g 12.5 mL/hr over 240 Minutes Intravenous Every 8 hours 02/02/17 1922     02/02/17 1800  metroNIDAZOLE (FLAGYL) IVPB 500 mg  Status:  Discontinued     500 mg 100 mL/hr over 60 Minutes Intravenous  Once 02/02/17 1755 02/02/17 1823   02/02/17 1800  piperacillin-tazobactam (ZOSYN) IVPB 3.375 g     3.375 g 100 mL/hr over 30 Minutes  Intravenous  Once 02/02/17 1755 02/02/17 1922       Assessment/Plan Pelvic abscess  - s/p sigmoid colectomy for colovesical fistula 01/14/17 Dr. Marcello Moores - CT scan shows enhancing hypodense abscess collection involving the right iliacus and psoas muscles measuring 4.1 x 5.5 x 6.7 cm Small bowel obstruction - possibly due to abscess and adhesions. No need for NG tube at this time as she is no longer vomiting Dehydration - continue IVF  GERD - protonix  ID - zosyn 5/1>> FEN - IVF, NPO VTE - SCDs, hold chemical DVT prophylaxis for possible procedure  Plan - I will call IR for consideration of percutaneous drainage of pelvic abscess. Continue NPO, IV antibiotics. Recheck labs   LOS: 1 day    Jerrye Beavers , [facility] Surgery 02/03/2017, 7:49 AM Pager: [phone] Consults: [phone] Mon-Fri 7:00 am-4:30 pm Sat-Sun 7:00 am-11:30 am

## 2017-02-04 LAB — CBC
HEMATOCRIT: 27.6 % — AB (ref 36.0–46.0)
Hemoglobin: 8.8 g/dL — ABNORMAL LOW (ref 12.0–15.0)
MCH: 27 pg (ref 26.0–34.0)
MCHC: 31.9 g/dL (ref 30.0–36.0)
MCV: 84.7 fL (ref 78.0–100.0)
PLATELETS: 472 10*3/uL — AB (ref 150–400)
RBC: 3.26 MIL/uL — AB (ref 3.87–5.11)
RDW: 15.2 % (ref 11.5–15.5)
WBC: 17.2 10*3/uL — AB (ref 4.0–10.5)

## 2017-02-04 LAB — BASIC METABOLIC PANEL
ANION GAP: 8 (ref 5–15)
BUN: 16 mg/dL (ref 6–20)
CALCIUM: 8.1 mg/dL — AB (ref 8.9–10.3)
CO2: 23 mmol/L (ref 22–32)
Chloride: 104 mmol/L (ref 101–111)
Creatinine, Ser: 0.77 mg/dL (ref 0.44–1.00)
Glucose, Bld: 138 mg/dL — ABNORMAL HIGH (ref 65–99)
Potassium: 4.5 mmol/L (ref 3.5–5.1)
SODIUM: 135 mmol/L (ref 135–145)

## 2017-02-04 MED ORDER — ENOXAPARIN SODIUM 40 MG/0.4ML ~~LOC~~ SOLN
40.0000 mg | SUBCUTANEOUS | Status: DC
  Administered 2017-02-05 – 2017-02-07 (×3): 40 mg via SUBCUTANEOUS
  Filled 2017-02-04 (×3): qty 0.4

## 2017-02-04 MED ORDER — LIP MEDEX EX OINT
TOPICAL_OINTMENT | CUTANEOUS | Status: AC
  Administered 2017-02-04: 20:00:00
  Filled 2017-02-04: qty 7

## 2017-02-04 MED ORDER — MORPHINE SULFATE (PF) 4 MG/ML IV SOLN
1.0000 mg | INTRAVENOUS | Status: DC | PRN
  Administered 2017-02-04 – 2017-02-05 (×3): 2 mg via INTRAVENOUS
  Filled 2017-02-04 (×3): qty 1

## 2017-02-04 MED ORDER — METHOCARBAMOL 1000 MG/10ML IJ SOLN
500.0000 mg | Freq: Three times a day (TID) | INTRAVENOUS | Status: DC | PRN
  Filled 2017-02-04: qty 5

## 2017-02-04 MED ORDER — SODIUM CHLORIDE 0.45 % IV SOLN
INTRAVENOUS | Status: DC
  Administered 2017-02-04 – 2017-02-06 (×3): via INTRAVENOUS

## 2017-02-04 MED ORDER — POLYETHYLENE GLYCOL 3350 17 G PO PACK
17.0000 g | PACK | Freq: Every day | ORAL | Status: DC
  Administered 2017-02-05 – 2017-02-06 (×2): 17 g via ORAL
  Filled 2017-02-04 (×3): qty 1

## 2017-02-04 MED ORDER — SODIUM CHLORIDE 0.45 % IV SOLN
INTRAVENOUS | Status: DC
  Filled 2017-02-04: qty 1000

## 2017-02-04 MED ORDER — BOOST / RESOURCE BREEZE PO LIQD
1.0000 | Freq: Three times a day (TID) | ORAL | Status: DC
  Administered 2017-02-04 – 2017-02-07 (×6): 1 via ORAL
  Filled 2017-02-04: qty 1

## 2017-02-04 NOTE — NC FL2 (Signed)
[place] LEVEL OF CARE SCREENING TOOL     IDENTIFICATION  Patient Name: Charlene [name] Birthdate: Jan 19, 1928 Sex: female Admission Date (Current Location): 02/02/2017  [facility] and [place] Number:  Herbalist and Address:  [facility],  [place] [address] [place]      Provider Number: [id number]  Attending Physician Name and Address:  Md Edison Pace, MD  Relative Name and Phone Number:       Current Level of Care: Hospital Recommended Level of Care: [place] Prior Approval Number:    Date Approved/Denied:   PASRR Number: [id number]  Discharge Plan: SNF    Current Diagnoses: Patient Active Problem List   Diagnosis Date Noted  . Pelvic abscess in female 02/02/2017  . Colovesical fistula with cancer s/p colectomy/repair 01/14/2017 01/18/2017  . 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 Protein-calorie malnutrition, severe 02/01/2016  . Depression 01/31/2016  . GERD (gastroesophageal reflux disease) 01/31/2016  . Hemorrhoids 01/31/2016  . Acute diverticulitis 05/05/2015  . Abnormal LFTs 05/05/2015  . Dyslipidemia 03/13/2015  . Leukocytosis 03/13/2015  . Acute renal failure ([place]) 03/13/2015    Orientation RESPIRATION BLADDER Height & Weight     Self, Time, Situation, Place  Normal Continent Weight: 135 lb 5.8 oz (61.4 kg) Height:  5\' 4"  (162.6 cm)  BEHAVIORAL SYMPTOMS/MOOD NEUROLOGICAL BOWEL NUTRITION STATUS  Other (Comment) (no behaviors)   Continent Diet (clear liquids-expect to progress)  AMBULATORY STATUS COMMUNICATION OF NEEDS Skin   Limited Assist Verbally Surgical wounds (Pt has Percutaneous Drain.)    Needs drain care per IR recommendations.                   Personal Care Assistance Level of Assistance  Bathing, Feeding, Dressing Bathing Assistance: Limited assistance Feeding assistance: Independent Dressing Assistance: Limited assistance      Functional Limitations Info  Sight, Hearing, Speech Sight Info: Adequate Hearing Info: Adequate Speech Info: Adequate    SPECIAL CARE FACTORS FREQUENCY  PT (By licensed PT), OT (By licensed OT)     PT Frequency: 5x wk OT Frequency: 5x wk            Contractures Contractures Info: Not present    Additional Factors Info  Code Status Code Status Info: DNR             Current Medications (02/04/2017):  This is the current hospital active medication list Current Facility-Administered Medications  Medication Dose Route Frequency Provider Last Rate Last Dose  . 0.45 % sodium chloride infusion   Intravenous Continuous Jerrye Beavers, PA-C 75 mL/hr at 02/04/17 1144    . acetaminophen (TYLENOL) tablet 650 mg  650 mg Oral Q6H PRN Armandina Gemma, MD       Or  . acetaminophen (TYLENOL) suppository 650 mg  650 mg Rectal Q6H PRN Armandina Gemma, MD      . enoxaparin (LOVENOX) injection 40 mg  40 mg Subcutaneous Q24H Jerrye Beavers, PA-C      . HYDROcodone-acetaminophen (NORCO/VICODIN) 5-325 MG per tablet 1-2 tablet  1-2 tablet Oral Q4H PRN Armandina Gemma, MD      . methocarbamol (ROBAXIN) 500 mg in dextrose 5 % 50 mL IVPB  500 mg Intravenous Q8H PRN Jerrye Beavers, PA-C      . morphine 4 MG/ML injection 1-2 mg  1-2 mg Intravenous Q4H PRN Jerrye Beavers, PA-C   2 mg at 02/04/17 1145  . ondansetron (ZOFRAN-ODT) disintegrating tablet  4 mg  4 mg Oral Q6H PRN Armandina Gemma, MD       Or  . ondansetron [facility]) injection 4 mg  4 mg Intravenous Q6H PRN Armandina Gemma, MD      . pantoprazole (PROTONIX) injection 40 mg  40 mg Intravenous QHS Armandina Gemma, MD   40 mg at 02/03/17 2310  . piperacillin-tazobactam (ZOSYN) IVPB 3.375 g  3.375 g Intravenous Q8H Armandina Gemma, MD   Stopped at 02/04/17 [phone]  . polyethylene glycol (MIRALAX / GLYCOLAX) packet 17 g  17 g Oral Daily Brooke A Miller, PA-C      . triamcinolone (NASACORT) nasal inhaler 2 spray  2 spray Nasal Daily PRN Jerrye Beavers, PA-C          Discharge Medications: Please see discharge summary for a list of discharge medications.  Relevant Imaging Results:  Relevant Lab Results:   Additional Information ssn:[id number]  Haidinger, Randall An, LCSW

## 2017-02-04 NOTE — Progress Notes (Signed)
CSW met with pt and spoke with son to assist with d/c planning and offer support. Pt / son reports that bed at [facility] is not being held and has requested new SNF search. New search has been initiated and bed offers pending.  Werner Lean LCSW [phone]

## 2017-02-04 NOTE — Evaluation (Signed)
Physical Therapy Evaluation Patient Details Name: Charlene [name] MRN: [id number] DOB: [birth year] Today's Date: 02/04/2017   History of Present Illness  83 yo female admittd with pelvic abscess. S/P drain placement by IR 02/03/17. Hx of sigmoid colectomy 01/14/17, macular degeneration, BPPV, depression  Clinical Impression  On eval, pt required Min assist for mobility. Pt agreed to work with therapy in room on today. She was able to stand and take a few steps along the side of the bed. Pt presents with general weakness, decreased activity tolerance, and impaired gait and balance. Recommend return to SNF for continued rehab. Will follow.     Follow Up Recommendations SNF    Equipment Recommendations   (TBD at next venue)    Recommendations for Other Services       Precautions / Restrictions Precautions Precautions: Fall Precaution Comments: drain R side abdomen Restrictions Weight Bearing Restrictions: No      Mobility  Bed Mobility Overal bed mobility: Needs Assistance Bed Mobility: Supine to Sit;Sit to Supine     Supine to sit: Min assist;HOB elevated Sit to supine: Min assist;HOB elevated   General bed mobility comments: Assist for trunk and LEs. Increased time.   Transfers Overall transfer level: Needs assistance Equipment used: Rolling walker (2 wheeled) Transfers: Sit to/from Stand Sit to Stand: Min assist         General transfer comment: x2. Assist to rise, stabilize, control descent. VCs safety, hand placement  Ambulation/Gait Ambulation/Gait assistance: Min assist   Assistive device: Rolling walker (2 wheeled)       General Gait Details: side steps along side of bed with a RW. Pt did not feel she could attempt ambulation into hallway on today.   Stairs            Wheelchair Mobility    Modified Rankin (Stroke Patients Only)       Balance Overall balance assessment: Needs assistance         Standing balance support: Bilateral upper  extremity supported Standing balance-Leahy Scale: Poor                               Pertinent Vitals/Pain Pain Assessment: Faces Faces Pain Scale: Hurts even more Pain Location: abdomen Pain Descriptors / Indicators: Sore;Discomfort Pain Intervention(s): Limited activity within patient's tolerance;Repositioned    Home Living Family/patient expects to be discharged to:: Skilled nursing facility                      Prior Function                 Hand Dominance        Extremity/Trunk Assessment   Upper Extremity Assessment Upper Extremity Assessment: Generalized weakness    Lower Extremity Assessment Lower Extremity Assessment: Generalized weakness    Cervical / Trunk Assessment Cervical / Trunk Assessment: Normal  Communication      Cognition Arousal/Alertness: Awake/alert Behavior During Therapy: WFL for tasks assessed/performed Overall Cognitive Status: Within Functional Limits for tasks assessed                                        General Comments      Exercises     Assessment/Plan    PT Assessment Patient needs continued PT services  PT Problem List Decreased strength;Decreased mobility;Decreased activity tolerance;Decreased balance;Decreased  knowledge of use of DME;Pain       PT Treatment Interventions DME instruction;Gait training;Therapeutic exercise;Patient/family education;Balance training;Functional mobility training;Therapeutic activities    PT Goals (Current goals can be found in the Care Plan section)  Acute Rehab PT Goals Patient Stated Goal: to regain PLOF PT Goal Formulation: With patient Time For Goal Achievement: 02/18/17 Potential to Achieve Goals: Good    Frequency Min 3X/week   Barriers to discharge        Co-evaluation               AM-PAC PT "6 Clicks" Daily Activity  Outcome Measure Difficulty turning over in bed (including adjusting bedclothes, sheets and blankets)?:  A Little Difficulty moving from lying on back to sitting on the side of the bed? : A Little Difficulty sitting down on and standing up from a chair with arms (e.g., wheelchair, bedside commode, etc,.)?: A Little Help needed moving to and from a bed to chair (including a wheelchair)?: A Little Help needed walking in hospital room?: A Little Help needed climbing 3-5 steps with a railing? : A Lot 6 Click Score: 17    End of Session   Activity Tolerance: Patient limited by fatigue;Patient limited by pain Patient left: in bed;with call bell/phone within reach;with bed alarm set   PT Visit Diagnosis: Muscle weakness (generalized) (M62.81);Difficulty in walking, not elsewhere classified (R26.2)    Time: 7356-7014 PT Time Calculation (min) (ACUTE ONLY): 18 min   Charges:   PT Evaluation $PT Eval Low Complexity: 1 Procedure     PT G Codes:          Weston Anna, MPT Pager: [phone]

## 2017-02-04 NOTE — Progress Notes (Signed)
Late entry. This nurse admitted patient to unit. Patient had blanchable red area between buttocks approximately 1cm wide and 2.5 cm long. This area does not have any skin breakdown upon admission on 02/02/17.

## 2017-02-04 NOTE — Progress Notes (Signed)
Plan for d/c to SNF, discharge planning per CSW. [phone] 

## 2017-02-04 NOTE — Progress Notes (Signed)
Referring Physician(s): Rosenbower,T  Supervising Physician: Sandi Mariscal  Patient Status:  [facility] - In-pt  Chief Complaint:  Pelvic abscess  Subjective: Pt still weak, feels a little better than yesterday; denies worsening abd pain,N/V,resp difficulties; no BM   Allergies: Patient has no known allergies.  Medications: Prior to Admission medications   Medication Sig Start Date End Date Taking? Authorizing Provider  Carboxymethylcellul-Glycerin (LUBRICATING EYE DROPS OP) Apply 1 drop to eye daily as needed (dry eyes).   Yes Historical Provider, MD  docusate sodium (COLACE) 100 MG capsule Take 100 mg by mouth daily.   Yes Historical Provider, MD  famotidine (PEPCID AC) 10 MG chewable tablet Chew 2 tablets (20 mg total) by mouth at bedtime as needed for heartburn. 01/20/17  Yes Earnstine Regal, PA-C  lactose free nutrition (BOOST) LIQD Take 237 mLs by mouth daily.   Yes Historical Provider, MD  mirtazapine (REMERON) 7.5 MG tablet Take 7.5 mg by mouth at bedtime.   Yes Historical Provider, MD  Multiple Vitamins-Minerals (MULTIVITAMIN ADULT PO) Take 1 tablet by mouth daily.    Yes Historical Provider, MD  Multiple Vitamins-Minerals (PRESERVISION AREDS 2) CAPS Take 1 capsule by mouth daily.   Yes Historical Provider, MD  ondansetron (ZOFRAN) 4 MG tablet Take 4 mg by mouth every 6 (six) hours as needed for nausea or vomiting.   Yes Historical Provider, MD  protein supplement (RESOURCE BENEPROTEIN) 6 g POWD You can use whatever protein supplement that taste good to you.  Try to get 2-3 cans per day.  Do this till your ready to go home and take care of yourself. 01/20/17  Yes Earnstine Regal, PA-C  traMADol (ULTRAM) 50 MG tablet Take 1-2 tablets (50-100 mg total) by mouth every 6 (six) hours as needed for moderate pain or severe pain. 01/20/17  Yes Earnstine Regal, PA-C  triamcinolone (NASACORT AQ) 55 MCG/ACT AERO nasal inhaler Place 2 sprays into the nose daily as needed (for allergies).    Yes Historical Provider, MD  acetaminophen (TYLENOL) 500 MG tablet Take 500 mg by mouth every 6 (six) hours as needed for moderate pain.     Historical Provider, MD     Vital Signs: BP (!) 146/51 (BP Location: Left Arm)   Pulse (!) 54   Temp 97.4 F (36.3 C) (Oral)   Resp 16   Ht 5\' 4"  (1.626 m)   Wt 135 lb 5.8 oz (61.4 kg)   SpO2 97%   BMI 23.23 kg/m   Physical Exam  Rt pelvic drain intact, insertion site ok, mildly tender, output 35 cc turbid, blood-tinged fluid; drain flushed with 10 cc sterile NS without difficulty  Imaging: Ct Abdomen Pelvis W Contrast  Result Date: 02/02/2017 CLINICAL DATA:  Generalized abdominal pain and fatigue status post sigmoidectomy and colovesicular fistula takedown. Low grade fever to 100.2 in the nursing home. Reports nausea and vomiting over the past 48 hours. EXAM: CT ABDOMEN AND PELVIS WITH CONTRAST TECHNIQUE: Multidetector CT imaging of the abdomen and pelvis was performed using the standard protocol following bolus administration of intravenous contrast. CONTRAST:  161mL ISOVUE-300 IOPAMIDOL (ISOVUE-300) INJECTION 61% COMPARISON:  12/22/2016 FINDINGS: Lower chest: Stable cardiomegaly without pericardial effusion. Subsegmental atelectasis in both lower lobes with trace right effusion. Hepatobiliary: 7 mm circumscribed hypodensity in the left hepatic lobe is unchanged statistically consistent with a cyst but too small to further characterize. No other suspicious hepatic lesions. No intra nor extrahepatic ductal dilatation. Gallstones are present along the dependent wall of the fundus. No  evidence of acute cholecystitis. Pancreas: Unremarkable. No pancreatic ductal dilatation or surrounding inflammatory changes. Spleen: Normal in size without focal abnormality. Adrenals/Urinary Tract: Normal bilateral adrenal glands unstable water attenuating cysts both kidneys, the largest is in the upper pole the left kidney measuring 3 cm in diameter within interpolar right  renal cyst measuring 1 cm. The urinary bladder is physiologically distended without focal mass or calculus. Tiny focus of air along the nondependent wall is noted within possibly from instrumentation. No definite evidence of fistulous recurrence. Stomach/Bowel: The stomach is contracted in appearance. There is normal small bowel rotation. Dilated fluid and air-filled small bowel loops are seen from the third portion of the duodenum through distal ileum were there is a transition point adjacent to what appears to be an abscess measuring 4.1 x 5.5 x 6.7 cm involving the right iliacus and psoas muscles, series 2, image 61. The terminal ileum is decompressed. Stool is noted within large bowel. The patient has undergone sigmoidectomy with new surgical clips noted in the left lower hemipelvis. Mild presacral edema and induration is noted posterior to the rectum which may reflect postop change. Vascular/Lymphatic: There is aortic atherosclerosis without evidence of aneurysm or dissection. No lymphadenopathy. Reproductive: Status post hysterectomy. Bilateral salpingo-oophorectomy. Other: No pneumoperitoneum. Musculoskeletal: No aggressive appearing lytic or blastic disease. IMPRESSION: 1. Enhancing hypodense abscess collection involving the right iliacus and psoas muscles measuring 4.1 x 5.5 x 6.7 cm. This appears to be contributing to a new small bowel obstruction possibly from inflammation and adhesions involving the adjacent distal ileum within the right hemipelvis. Mild presacral edema is noted which may be secondary to prior surgery and intervention. 2. Uncomplicated cholelithiasis. 3. Stable hepatic and renal cysts. 4. Aortic and branch vessel atherosclerosis. Electronically Signed   By: Ashley Royalty M.D.   On: 02/02/2017 17:39   Ct Image Guided Drainage By Percutaneous Catheter  Result Date: 02/03/2017 INDICATION: History of diverticulitis, post sigmoid colectomy and takedown of colovesicular fistula by Dr. Marcello Moores  on 01/14/2017 however the patient returned to the hospital with right lower quadrant abdominal pain and low-grade fever with CT scan of the abdomen pelvis performed 02/02/2017 demonstrating development of indeterminate fluid collection within the right pelvic sidewall adjacent to several pelvic side wall surgical clips. Request made for CT-guided percutaneous drainage catheter placement for infection source control. EXAM: CT IMAGE GUIDED DRAINAGE BY PERCUTANEOUS CATHETER COMPARISON:  CT abdomen pelvis - 02/02/2017; 12/15/2026 MEDICATIONS: The patient is currently admitted to the hospital and receiving intravenous antibiotics. The antibiotics were administered within an appropriate time frame prior to the initiation of the procedure. ANESTHESIA/SEDATION: Moderate (conscious) sedation was employed during this procedure. A total of Versed 1 mg and Fentanyl 25 mcg was administered intravenously. Moderate Sedation Time: 15 minutes. The patient's level of consciousness and vital signs were monitored continuously by radiology nursing throughout the procedure under my direct supervision. CONTRAST:  None COMPLICATIONS: None immediate. PROCEDURE: Informed written consent was obtained from the patient after a discussion of the risks, benefits and alternatives to treatment. The patient was placed supine on the CT gantry and a pre procedural CT was performed re-demonstrating the known abscess/fluid collection within the right hemipelvis with dominant ill-defined component measuring approximately 5.0 x 3.7 cm (image 31, series 2). The procedure was planned. A timeout was performed prior to the initiation of the procedure. The skin overlying the anterior aspect the right lower abdomen/pelvis was prepped and draped in the usual sterile fashion. The overlying soft tissues were anesthetized with 1% lidocaine  with epinephrine. Appropriate trajectory was planned with the use of a 22 gauge spinal needle. An 18 gauge trocar needle was  advanced into the abscess/fluid collection and a short Amplatz super stiff wire was coiled within the collection. Appropriate positioning was confirmed with a limited CT scan. The tract was serially dilated allowing placement of a 10 [place] all-purpose drainage catheter. Appropriate positioning was confirmed with a limited postprocedural CT scan. Approximately 75 cc of purulent fluid was aspirated. The tube was connected to a drainage bag and sutured in place. A dressing was placed. The patient tolerated the procedure well without immediate post procedural complication. IMPRESSION: Successful CT guided placement of a 10 French all purpose drain catheter into the right lower abdomen/pelvis with aspiration of 75 mL of purulent fluid. Samples were sent to the laboratory as requested by the ordering clinical team. Electronically Signed   By: Sandi Mariscal M.D.   On: 02/03/2017 16:15    Labs:  CBC:  Recent Labs  01/19/17 0440 02/02/17 1517 02/03/17 0814 02/04/17 0518  WBC 10.4 22.6* 20.8* 17.2*  HGB 8.0* 10.9* 8.6* 8.8*  HCT 24.3* 32.5* 26.2* 27.6*  PLT 408* 582* 501* 472*    COAGS:  Recent Labs  02/03/17 1005  INR 1.38  APTT 38*    BMP:  Recent Labs  01/19/17 0440 02/02/17 1517 02/03/17 0814 02/04/17 0518  NA 136 131* 132* 135  K 4.2 4.9 4.3 4.5  CL 108 95* 101 104  CO2 24 25 22 23   GLUCOSE 92 120* 162* 138*  BUN 11 29* 20 16  CALCIUM 7.6* 9.0 8.1* 8.1*  CREATININE 0.61 0.90 0.77 0.77  GFRNONAA >60 56* >60 >60  GFRAA >60 >60 >60 >60    LIVER FUNCTION TESTS:  Recent Labs  01/06/17 1526 01/11/17 1033 01/19/17 0440 02/02/17 1517  BILITOT 0.7 0.5 0.2* 1.3*  AST 20 19 18  62*  ALT 13* 12* 12* 46  ALKPHOS 43 43 35* 150*  PROT 6.1* 6.7 5.1* 7.7  ALBUMIN 2.4* 3.0* 1.9* 2.8*    Assessment and Plan: S/p sigmoid colectomy for colovesical fistula 01/14/17 Dr. Marcello Moores; s/p rt pelvic abscess drain 5/2; AF; WBC 17.2(20.8), hgb 8.8(8.6), creat nl; drain fluid cx pend; cont  drain irrigation, monitor labs, OOB, diet per CCS; once drain output minimal check f/u CT/drain injection.   Electronically Signed: D. Rowe Robert 02/04/2017, 1:16 PM   I spent a total of 15 minutes at the the patient's bedside AND on the patient's hospital floor or unit, greater than 50% of which was counseling/coordinating care for pelvic abscess drain    Patient ID: Shawnee Knapp, female   DOB: [birth year], 83 y.o.   MRN: [id number]

## 2017-02-04 NOTE — Progress Notes (Signed)
Patient ID: Charlene [name], female   DOB: 11-19-1927, 83 y.o.   MRN: [id number]  [facility] Surgery Progress Note     Subjective: CC- pelvic abscess, abdominal pain Patient had abdominal drain placed in IR yesterday; 75cc purulent fluid was returned.  States that she continues to have right sided/lower abdominal pain today, minimally improved. Denies n/v. Documented in chart that patient had a BM yesterday, but both patient and RN unsure if she actually did. WBC is trending down, afebrile.  Objective: Vital signs in last 24 hours: Temp:  [97.4 F (36.3 C)-98.4 F (36.9 C)] 97.4 F (36.3 C) (05/03 0542) Pulse Rate:  [54-62] 54 (05/03 0542) Resp:  [15-23] 16 (05/03 0542) BP: (119-154)/(46-64) 146/51 (05/03 0542) SpO2:  [93 %-98 %] 97 % (05/03 0542) Last BM Date: 01/31/17  Intake/Output from previous day: 05/02 0701 - 05/03 0700 In: 2500 [I.V.:2400; IV Piggyback:100] Out: 302.5 [Urine:300; Drains:2.5] Intake/Output this shift: No intake/output data recorded.  PE: Gen:  Alert, NAD, pleasant Card:  RRR, no M/G/R heard Pulm:  CTAB, no W/R/R, effort normal Abd: Soft, ND, well healed midline incision, +BS, TTP RLQ with no rebound or guarding Ext:  No erythema, edema, or tenderness BUE/BLE   Lab Results:   Recent Labs  02/03/17 0814 02/04/17 0518  WBC 20.8* 17.2*  HGB 8.6* 8.8*  HCT 26.2* 27.6*  PLT 501* 472*   BMET  Recent Labs  02/03/17 0814 02/04/17 0518  NA 132* 135  K 4.3 4.5  CL 101 104  CO2 22 23  GLUCOSE 162* 138*  BUN 20 16  CREATININE 0.77 0.77  CALCIUM 8.1* 8.1*   PT/INR  Recent Labs  02/03/17 1005  LABPROT 17.0*  INR 1.38   CMP     Component Value Date/Time   NA 135 02/04/2017 0518   K 4.5 02/04/2017 0518   CL 104 02/04/2017 0518   CO2 23 02/04/2017 0518   GLUCOSE 138 (H) 02/04/2017 0518   BUN 16 02/04/2017 0518   CREATININE 0.77 02/04/2017 0518   CALCIUM 8.1 (L) 02/04/2017 0518   PROT 7.7 02/02/2017 1517   ALBUMIN 2.8 (L)  02/02/2017 1517   AST 62 (H) 02/02/2017 1517   ALT 46 02/02/2017 1517   ALKPHOS 150 (H) 02/02/2017 1517   BILITOT 1.3 (H) 02/02/2017 1517   GFRNONAA >60 02/04/2017 0518   GFRAA >60 02/04/2017 0518   Lipase     Component Value Date/Time   LIPASE 25 02/02/2017 1517       Studies/Results: Ct Abdomen Pelvis W Contrast  Result Date: 02/02/2017 CLINICAL DATA:  Generalized abdominal pain and fatigue status post sigmoidectomy and colovesicular fistula takedown. Low grade fever to 100.2 in the nursing home. Reports nausea and vomiting over the past 48 hours. EXAM: CT ABDOMEN AND PELVIS WITH CONTRAST TECHNIQUE: Multidetector CT imaging of the abdomen and pelvis was performed using the standard protocol following bolus administration of intravenous contrast. CONTRAST:  175mL ISOVUE-300 IOPAMIDOL (ISOVUE-300) INJECTION 61% COMPARISON:  12/22/2016 FINDINGS: Lower chest: Stable cardiomegaly without pericardial effusion. Subsegmental atelectasis in both lower lobes with trace right effusion. Hepatobiliary: 7 mm circumscribed hypodensity in the left hepatic lobe is unchanged statistically consistent with a cyst but too small to further characterize. No other suspicious hepatic lesions. No intra nor extrahepatic ductal dilatation. Gallstones are present along the dependent wall of the fundus. No evidence of acute cholecystitis. Pancreas: Unremarkable. No pancreatic ductal dilatation or surrounding inflammatory changes. Spleen: Normal in size without focal abnormality. Adrenals/Urinary Tract: Normal bilateral  adrenal glands unstable water attenuating cysts both kidneys, the largest is in the upper pole the left kidney measuring 3 cm in diameter within interpolar right renal cyst measuring 1 cm. The urinary bladder is physiologically distended without focal mass or calculus. Tiny focus of air along the nondependent wall is noted within possibly from instrumentation. No definite evidence of fistulous recurrence.  Stomach/Bowel: The stomach is contracted in appearance. There is normal small bowel rotation. Dilated fluid and air-filled small bowel loops are seen from the third portion of the duodenum through distal ileum were there is a transition point adjacent to what appears to be an abscess measuring 4.1 x 5.5 x 6.7 cm involving the right iliacus and psoas muscles, series 2, image 61. The terminal ileum is decompressed. Stool is noted within large bowel. The patient has undergone sigmoidectomy with new surgical clips noted in the left lower hemipelvis. Mild presacral edema and induration is noted posterior to the rectum which may reflect postop change. Vascular/Lymphatic: There is aortic atherosclerosis without evidence of aneurysm or dissection. No lymphadenopathy. Reproductive: Status post hysterectomy. Bilateral salpingo-oophorectomy. Other: No pneumoperitoneum. Musculoskeletal: No aggressive appearing lytic or blastic disease. IMPRESSION: 1. Enhancing hypodense abscess collection involving the right iliacus and psoas muscles measuring 4.1 x 5.5 x 6.7 cm. This appears to be contributing to a new small bowel obstruction possibly from inflammation and adhesions involving the adjacent distal ileum within the right hemipelvis. Mild presacral edema is noted which may be secondary to prior surgery and intervention. 2. Uncomplicated cholelithiasis. 3. Stable hepatic and renal cysts. 4. Aortic and branch vessel atherosclerosis. Electronically Signed   By: Ashley Royalty M.D.   On: 02/02/2017 17:39   Ct Image Guided Drainage By Percutaneous Catheter  Result Date: 02/03/2017 INDICATION: History of diverticulitis, post sigmoid colectomy and takedown of colovesicular fistula by Dr. Marcello Moores on 01/14/2017 however the patient returned to the hospital with right lower quadrant abdominal pain and low-grade fever with CT scan of the abdomen pelvis performed 02/02/2017 demonstrating development of indeterminate fluid collection within the  right pelvic sidewall adjacent to several pelvic side wall surgical clips. Request made for CT-guided percutaneous drainage catheter placement for infection source control. EXAM: CT IMAGE GUIDED DRAINAGE BY PERCUTANEOUS CATHETER COMPARISON:  CT abdomen pelvis - 02/02/2017; 12/15/2026 MEDICATIONS: The patient is currently admitted to the hospital and receiving intravenous antibiotics. The antibiotics were administered within an appropriate time frame prior to the initiation of the procedure. ANESTHESIA/SEDATION: Moderate (conscious) sedation was employed during this procedure. A total of Versed 1 mg and Fentanyl 25 mcg was administered intravenously. Moderate Sedation Time: 15 minutes. The patient's level of consciousness and vital signs were monitored continuously by radiology nursing throughout the procedure under my direct supervision. CONTRAST:  None COMPLICATIONS: None immediate. PROCEDURE: Informed written consent was obtained from the patient after a discussion of the risks, benefits and alternatives to treatment. The patient was placed supine on the CT gantry and a pre procedural CT was performed re-demonstrating the known abscess/fluid collection within the right hemipelvis with dominant ill-defined component measuring approximately 5.0 x 3.7 cm (image 31, series 2). The procedure was planned. A timeout was performed prior to the initiation of the procedure. The skin overlying the anterior aspect the right lower abdomen/pelvis was prepped and draped in the usual sterile fashion. The overlying soft tissues were anesthetized with 1% lidocaine with epinephrine. Appropriate trajectory was planned with the use of a 22 gauge spinal needle. An 18 gauge trocar needle was advanced into the abscess/fluid  collection and a short Amplatz super stiff wire was coiled within the collection. Appropriate positioning was confirmed with a limited CT scan. The tract was serially dilated allowing placement of a 10 [place]  all-purpose drainage catheter. Appropriate positioning was confirmed with a limited postprocedural CT scan. Approximately 75 cc of purulent fluid was aspirated. The tube was connected to a drainage bag and sutured in place. A dressing was placed. The patient tolerated the procedure well without immediate post procedural complication. IMPRESSION: Successful CT guided placement of a 10 French all purpose drain catheter into the right lower abdomen/pelvis with aspiration of 75 mL of purulent fluid. Samples were sent to the laboratory as requested by the ordering clinical team. Electronically Signed   By: Sandi Mariscal M.D.   On: 02/03/2017 16:15    Anti-infectives: Anti-infectives    Start     Dose/Rate Route Frequency Ordered Stop   02/03/17 0200  piperacillin-tazobactam (ZOSYN) IVPB 3.375 g     3.375 g 12.5 mL/hr over 240 Minutes Intravenous Every 8 hours 02/02/17 1922     02/02/17 1800  metroNIDAZOLE (FLAGYL) IVPB 500 mg  Status:  Discontinued     500 mg 100 mL/hr over 60 Minutes Intravenous  Once 02/02/17 1755 02/02/17 1823   02/02/17 1800  piperacillin-tazobactam (ZOSYN) IVPB 3.375 g     3.375 g 100 mL/hr over 30 Minutes Intravenous  Once 02/02/17 1755 02/02/17 1922       Assessment/Plan Pelvic abscess  - s/p sigmoid colectomy for colovesical fistula 01/14/17 Dr. Marcello Moores - CT scan shows enhancing hypodense abscess collection involving the right iliacus and psoas muscles measuring 4.1 x 5.5 x 6.7 cm - s/p perc drain by IR 5/2 with 75cc purulent output; gram stain showing gram positive cocci, culture pending Small bowel obstruction - possibly due to abscess and adhesions - BM documented yesterday in chart, but patient and RN unsure if she actually had a BM Dehydration - continue IVF  GERD - protonix  ID - zosyn 5/1>> FEN - IVF, clear liquids, add miralax VTE - SCDs, lovenox  Plan - Continue drain, IV zosyn and follow culture. Erick for clear liquids today. Wean O2. Encourage IS.  Consult PT. Labs in AM.   LOS: 2 days    Jerrye Beavers , [facility] Surgery 02/04/2017, 8:56 AM Pager: [phone] Consults: [phone] Mon-Fri 7:00 am-4:30 pm Sat-Sun 7:00 am-11:30 am

## 2017-02-05 LAB — CBC
HCT: 27.1 % — ABNORMAL LOW (ref 36.0–46.0)
Hemoglobin: 8.9 g/dL — ABNORMAL LOW (ref 12.0–15.0)
MCH: 27.4 pg (ref 26.0–34.0)
MCHC: 32.8 g/dL (ref 30.0–36.0)
MCV: 83.4 fL (ref 78.0–100.0)
Platelets: 407 10*3/uL — ABNORMAL HIGH (ref 150–400)
RBC: 3.25 MIL/uL — ABNORMAL LOW (ref 3.87–5.11)
RDW: 15.1 % (ref 11.5–15.5)
WBC: 14.2 10*3/uL — ABNORMAL HIGH (ref 4.0–10.5)

## 2017-02-05 LAB — BASIC METABOLIC PANEL
Anion gap: 8 (ref 5–15)
BUN: 11 mg/dL (ref 6–20)
CALCIUM: 7.8 mg/dL — AB (ref 8.9–10.3)
CHLORIDE: 101 mmol/L (ref 101–111)
CO2: 23 mmol/L (ref 22–32)
CREATININE: 0.63 mg/dL (ref 0.44–1.00)
GFR calc non Af Amer: >60 mL/min (ref >60)
Glucose, Bld: 89 mg/dL (ref 65–99)
Potassium: 4.3 mmol/L (ref 3.5–5.1)
SODIUM: 132 mmol/L — AB (ref 135–145)

## 2017-02-05 LAB — PREALBUMIN: PREALBUMIN: 5.4 mg/dL — AB (ref 18–38)

## 2017-02-05 LAB — MAGNESIUM: Magnesium: 1.8 mg/dL (ref 1.7–2.4)

## 2017-02-05 LAB — PHOSPHORUS: PHOSPHORUS: 2.5 mg/dL (ref 2.5–4.6)

## 2017-02-05 MED ORDER — DOCUSATE SODIUM 100 MG PO CAPS
100.0000 mg | ORAL_CAPSULE | Freq: Two times a day (BID) | ORAL | Status: DC
  Administered 2017-02-05 – 2017-02-07 (×5): 100 mg via ORAL
  Filled 2017-02-05 (×5): qty 1

## 2017-02-05 NOTE — Progress Notes (Signed)
Patient ID: Charlene [name], female   DOB: 06/09/1928, 83 y.o.   MRN: [id number]  [facility] Surgery Progress Note     Subjective: CC- pelvic abscess, abdominal pain Patient states that she is feeling much better today. Abdomen still sore, but much improved from yesterday. Tolerating clear liquids. Denies n/v. +flatu, no BM. PT recommending SNF.  Objective: Vital signs in last 24 hours: Temp:  [97.7 F (36.5 C)-98.5 F (36.9 C)] 97.7 F (36.5 C) (05/04 0559) Pulse Rate:  [55-62] 55 (05/04 0559) Resp:  [16] 16 (05/04 0559) BP: (123-143)/(42-55) 136/55 (05/04 0559) SpO2:  [97 %-100 %] 100 % (05/04 0559) Last BM Date: 01/31/17  Intake/Output from previous day: 05/03 0701 - 05/04 0700 In: 1836.3 [P.O.:240; I.V.:1391.3; IV Piggyback:200] Out: 560 [Urine:500; Drains:60] Intake/Output this shift: No intake/output data recorded.  PE: Gen:  Alert, NAD, pleasant, appear much more comfortable today Card:  RRR, no M/G/R heard Pulm:  CTAB, no W/R/R, effort normal Abd: Soft, ND, well healed midline incision, +BS, mild TTP RLQ around drain, drain with trace purulent fluid in bag Ext:  No erythema, edema, or tenderness BUE/BLE   Lab Results:   Recent Labs  02/04/17 0518 02/05/17 0607  WBC 17.2* 14.2*  HGB 8.8* 8.9*  HCT 27.6* 27.1*  PLT 472* 407*   BMET  Recent Labs  02/04/17 0518 02/05/17 0607  NA 135 132*  K 4.5 4.3  CL 104 101  CO2 23 23  GLUCOSE 138* 89  BUN 16 11  CREATININE 0.77 0.63  CALCIUM 8.1* 7.8*   PT/INR  Recent Labs  02/03/17 1005  LABPROT 17.0*  INR 1.38   CMP     Component Value Date/Time   NA 132 (L) 02/05/2017 0607   K 4.3 02/05/2017 0607   CL 101 02/05/2017 0607   CO2 23 02/05/2017 0607   GLUCOSE 89 02/05/2017 0607   BUN 11 02/05/2017 0607   CREATININE 0.63 02/05/2017 0607   CALCIUM 7.8 (L) 02/05/2017 0607   PROT 7.7 02/02/2017 1517   ALBUMIN 2.8 (L) 02/02/2017 1517   AST 62 (H) 02/02/2017 1517   ALT 46 02/02/2017 1517    ALKPHOS 150 (H) 02/02/2017 1517   BILITOT 1.3 (H) 02/02/2017 1517   GFRNONAA >60 02/05/2017 0607   GFRAA >60 02/05/2017 0607   Lipase     Component Value Date/Time   LIPASE 25 02/02/2017 1517       Studies/Results: Ct Image Guided Drainage By Percutaneous Catheter  Result Date: 02/03/2017 INDICATION: History of diverticulitis, post sigmoid colectomy and takedown of colovesicular fistula by Dr. Marcello Moores on 01/14/2017 however the patient returned to the hospital with right lower quadrant abdominal pain and low-grade fever with CT scan of the abdomen pelvis performed 02/02/2017 demonstrating development of indeterminate fluid collection within the right pelvic sidewall adjacent to several pelvic side wall surgical clips. Request made for CT-guided percutaneous drainage catheter placement for infection source control. EXAM: CT IMAGE GUIDED DRAINAGE BY PERCUTANEOUS CATHETER COMPARISON:  CT abdomen pelvis - 02/02/2017; 12/15/2026 MEDICATIONS: The patient is currently admitted to the hospital and receiving intravenous antibiotics. The antibiotics were administered within an appropriate time frame prior to the initiation of the procedure. ANESTHESIA/SEDATION: Moderate (conscious) sedation was employed during this procedure. A total of Versed 1 mg and Fentanyl 25 mcg was administered intravenously. Moderate Sedation Time: 15 minutes. The patient's level of consciousness and vital signs were monitored continuously by radiology nursing throughout the procedure under my direct supervision. CONTRAST:  None COMPLICATIONS: None immediate.  PROCEDURE: Informed written consent was obtained from the patient after a discussion of the risks, benefits and alternatives to treatment. The patient was placed supine on the CT gantry and a pre procedural CT was performed re-demonstrating the known abscess/fluid collection within the right hemipelvis with dominant ill-defined component measuring approximately 5.0 x 3.7 cm (image  31, series 2). The procedure was planned. A timeout was performed prior to the initiation of the procedure. The skin overlying the anterior aspect the right lower abdomen/pelvis was prepped and draped in the usual sterile fashion. The overlying soft tissues were anesthetized with 1% lidocaine with epinephrine. Appropriate trajectory was planned with the use of a 22 gauge spinal needle. An 18 gauge trocar needle was advanced into the abscess/fluid collection and a short Amplatz super stiff wire was coiled within the collection. Appropriate positioning was confirmed with a limited CT scan. The tract was serially dilated allowing placement of a 10 [place] all-purpose drainage catheter. Appropriate positioning was confirmed with a limited postprocedural CT scan. Approximately 75 cc of purulent fluid was aspirated. The tube was connected to a drainage bag and sutured in place. A dressing was placed. The patient tolerated the procedure well without immediate post procedural complication. IMPRESSION: Successful CT guided placement of a 10 French all purpose drain catheter into the right lower abdomen/pelvis with aspiration of 75 mL of purulent fluid. Samples were sent to the laboratory as requested by the ordering clinical team. Electronically Signed   By: Sandi Mariscal M.D.   On: 02/03/2017 16:15    Anti-infectives: Anti-infectives    Start     Dose/Rate Route Frequency Ordered Stop   02/03/17 0200  piperacillin-tazobactam (ZOSYN) IVPB 3.375 g     3.375 g 12.5 mL/hr over 240 Minutes Intravenous Every 8 hours 02/02/17 1922     02/02/17 1800  metroNIDAZOLE (FLAGYL) IVPB 500 mg  Status:  Discontinued     500 mg 100 mL/hr over 60 Minutes Intravenous  Once 02/02/17 1755 02/02/17 1823   02/02/17 1800  piperacillin-tazobactam (ZOSYN) IVPB 3.375 g     3.375 g 100 mL/hr over 30 Minutes Intravenous  Once 02/02/17 1755 02/02/17 1922       Assessment/Plan Pelvic abscess  - s/p sigmoid colectomy for colovesical  fistula 01/14/17 Dr. Marcello Moores - CT scan shows enhancing hypodense abscess collection involving the right iliacus and psoas muscles measuring 4.1 x 5.5 x 6.7 cm - s/p perc drain by IR 5/2; gram stain showing gram positive cocci, culture pending Small bowel obstruction - possibly due to abscess and adhesions - patient passing flatus, no BM Dehydration - improved Severe malnutrition - prealbumin pending today  GERD - protonix  ID - zosyn 5/1>>day#4 FEN - IVF, full liquids advance as tolerated, Boost, continue miralax VTE - SCDs, lovenox  Plan - Continue drain, IV zosyn and follow culture. Advance to full liquid diet, and advance as tolerated. Wean O2. Continue PT/mobilization/IS. Planning d/c to SNF, hopefully will be ready in 1-2 days.   LOS: 3 days    Jerrye Beavers , [facility] Surgery 02/05/2017, 7:51 AM Pager: [phone] Consults: [phone] Mon-Fri 7:00 am-4:30 pm Sat-Sun 7:00 am-11:30 am

## 2017-02-05 NOTE — Progress Notes (Signed)
Referring Physician(s):  Dr.Todd Zella Richer  Supervising Physician: Daryll Brod  Patient Status:  [facility] - In-pt  Chief Complaint:  Pelvic abscess  Subjective: States she is feeling better.  Drain is less sore today.  Allergies: Patient has no known allergies.  Medications: Prior to Admission medications   Medication Sig Start Date End Date Taking? Authorizing Provider  Carboxymethylcellul-Glycerin (LUBRICATING EYE DROPS OP) Apply 1 drop to eye daily as needed (dry eyes).   Yes Historical Provider, MD  docusate sodium (COLACE) 100 MG capsule Take 100 mg by mouth daily.   Yes Historical Provider, MD  famotidine (PEPCID AC) 10 MG chewable tablet Chew 2 tablets (20 mg total) by mouth at bedtime as needed for heartburn. 01/20/17  Yes Earnstine Regal, PA-C  lactose free nutrition (BOOST) LIQD Take 237 mLs by mouth daily.   Yes Historical Provider, MD  mirtazapine (REMERON) 7.5 MG tablet Take 7.5 mg by mouth at bedtime.   Yes Historical Provider, MD  Multiple Vitamins-Minerals (MULTIVITAMIN ADULT PO) Take 1 tablet by mouth daily.    Yes Historical Provider, MD  Multiple Vitamins-Minerals (PRESERVISION AREDS 2) CAPS Take 1 capsule by mouth daily.   Yes Historical Provider, MD  ondansetron (ZOFRAN) 4 MG tablet Take 4 mg by mouth every 6 (six) hours as needed for nausea or vomiting.   Yes Historical Provider, MD  protein supplement (RESOURCE BENEPROTEIN) 6 g POWD You can use whatever protein supplement that taste good to you.  Try to get 2-3 cans per day.  Do this till your ready to go home and take care of yourself. 01/20/17  Yes Earnstine Regal, PA-C  traMADol (ULTRAM) 50 MG tablet Take 1-2 tablets (50-100 mg total) by mouth every 6 (six) hours as needed for moderate pain or severe pain. 01/20/17  Yes Earnstine Regal, PA-C  triamcinolone (NASACORT AQ) 55 MCG/ACT AERO nasal inhaler Place 2 sprays into the nose daily as needed (for allergies).   Yes Historical Provider, MD  acetaminophen  (TYLENOL) 500 MG tablet Take 500 mg by mouth every 6 (six) hours as needed for moderate pain.     Historical Provider, MD     Vital Signs: BP (!) 112/47 (BP Location: Left Arm)   Pulse (!) 57   Temp 98.2 F (36.8 C) (Oral)   Resp 16   Ht 5\' 4"  (1.626 m)   Wt 135 lb 5.8 oz (61.4 kg)   SpO2 99%   BMI 23.23 kg/m   Physical Exam  NAD, alert Abd:  RUQ/pelvic drain in place.  Insertion site c/d/I.  Bloody, purulent output in bag with 60 mL output yesterday.   Imaging: Ct Abdomen Pelvis W Contrast  Result Date: 02/02/2017 CLINICAL DATA:  Generalized abdominal pain and fatigue status post sigmoidectomy and colovesicular fistula takedown. Low grade fever to 100.2 in the nursing home. Reports nausea and vomiting over the past 48 hours. EXAM: CT ABDOMEN AND PELVIS WITH CONTRAST TECHNIQUE: Multidetector CT imaging of the abdomen and pelvis was performed using the standard protocol following bolus administration of intravenous contrast. CONTRAST:  184mL ISOVUE-300 IOPAMIDOL (ISOVUE-300) INJECTION 61% COMPARISON:  12/22/2016 FINDINGS: Lower chest: Stable cardiomegaly without pericardial effusion. Subsegmental atelectasis in both lower lobes with trace right effusion. Hepatobiliary: 7 mm circumscribed hypodensity in the left hepatic lobe is unchanged statistically consistent with a cyst but too small to further characterize. No other suspicious hepatic lesions. No intra nor extrahepatic ductal dilatation. Gallstones are present along the dependent wall of the fundus. No evidence of acute cholecystitis.  Pancreas: Unremarkable. No pancreatic ductal dilatation or surrounding inflammatory changes. Spleen: Normal in size without focal abnormality. Adrenals/Urinary Tract: Normal bilateral adrenal glands unstable water attenuating cysts both kidneys, the largest is in the upper pole the left kidney measuring 3 cm in diameter within interpolar right renal cyst measuring 1 cm. The urinary bladder is physiologically  distended without focal mass or calculus. Tiny focus of air along the nondependent wall is noted within possibly from instrumentation. No definite evidence of fistulous recurrence. Stomach/Bowel: The stomach is contracted in appearance. There is normal small bowel rotation. Dilated fluid and air-filled small bowel loops are seen from the third portion of the duodenum through distal ileum were there is a transition point adjacent to what appears to be an abscess measuring 4.1 x 5.5 x 6.7 cm involving the right iliacus and psoas muscles, series 2, image 61. The terminal ileum is decompressed. Stool is noted within large bowel. The patient has undergone sigmoidectomy with new surgical clips noted in the left lower hemipelvis. Mild presacral edema and induration is noted posterior to the rectum which may reflect postop change. Vascular/Lymphatic: There is aortic atherosclerosis without evidence of aneurysm or dissection. No lymphadenopathy. Reproductive: Status post hysterectomy. Bilateral salpingo-oophorectomy. Other: No pneumoperitoneum. Musculoskeletal: No aggressive appearing lytic or blastic disease. IMPRESSION: 1. Enhancing hypodense abscess collection involving the right iliacus and psoas muscles measuring 4.1 x 5.5 x 6.7 cm. This appears to be contributing to a new small bowel obstruction possibly from inflammation and adhesions involving the adjacent distal ileum within the right hemipelvis. Mild presacral edema is noted which may be secondary to prior surgery and intervention. 2. Uncomplicated cholelithiasis. 3. Stable hepatic and renal cysts. 4. Aortic and branch vessel atherosclerosis. Electronically Signed   By: Ashley Royalty M.D.   On: 02/02/2017 17:39   Ct Image Guided Drainage By Percutaneous Catheter  Result Date: 02/03/2017 INDICATION: History of diverticulitis, post sigmoid colectomy and takedown of colovesicular fistula by Dr. Marcello Moores on 01/14/2017 however the patient returned to the hospital with  right lower quadrant abdominal pain and low-grade fever with CT scan of the abdomen pelvis performed 02/02/2017 demonstrating development of indeterminate fluid collection within the right pelvic sidewall adjacent to several pelvic side wall surgical clips. Request made for CT-guided percutaneous drainage catheter placement for infection source control. EXAM: CT IMAGE GUIDED DRAINAGE BY PERCUTANEOUS CATHETER COMPARISON:  CT abdomen pelvis - 02/02/2017; 12/15/2026 MEDICATIONS: The patient is currently admitted to the hospital and receiving intravenous antibiotics. The antibiotics were administered within an appropriate time frame prior to the initiation of the procedure. ANESTHESIA/SEDATION: Moderate (conscious) sedation was employed during this procedure. A total of Versed 1 mg and Fentanyl 25 mcg was administered intravenously. Moderate Sedation Time: 15 minutes. The patient's level of consciousness and vital signs were monitored continuously by radiology nursing throughout the procedure under my direct supervision. CONTRAST:  None COMPLICATIONS: None immediate. PROCEDURE: Informed written consent was obtained from the patient after a discussion of the risks, benefits and alternatives to treatment. The patient was placed supine on the CT gantry and a pre procedural CT was performed re-demonstrating the known abscess/fluid collection within the right hemipelvis with dominant ill-defined component measuring approximately 5.0 x 3.7 cm (image 31, series 2). The procedure was planned. A timeout was performed prior to the initiation of the procedure. The skin overlying the anterior aspect the right lower abdomen/pelvis was prepped and draped in the usual sterile fashion. The overlying soft tissues were anesthetized with 1% lidocaine with epinephrine. Appropriate trajectory  was planned with the use of a 22 gauge spinal needle. An 18 gauge trocar needle was advanced into the abscess/fluid collection and a short Amplatz  super stiff wire was coiled within the collection. Appropriate positioning was confirmed with a limited CT scan. The tract was serially dilated allowing placement of a 10 [place] all-purpose drainage catheter. Appropriate positioning was confirmed with a limited postprocedural CT scan. Approximately 75 cc of purulent fluid was aspirated. The tube was connected to a drainage bag and sutured in place. A dressing was placed. The patient tolerated the procedure well without immediate post procedural complication. IMPRESSION: Successful CT guided placement of a 10 French all purpose drain catheter into the right lower abdomen/pelvis with aspiration of 75 mL of purulent fluid. Samples were sent to the laboratory as requested by the ordering clinical team. Electronically Signed   By: Sandi Mariscal M.D.   On: 02/03/2017 16:15    Labs:  CBC:  Recent Labs  02/02/17 1517 02/03/17 0814 02/04/17 0518 02/05/17 0607  WBC 22.6* 20.8* 17.2* 14.2*  HGB 10.9* 8.6* 8.8* 8.9*  HCT 32.5* 26.2* 27.6* 27.1*  PLT 582* 501* 472* 407*    COAGS:  Recent Labs  02/03/17 1005  INR 1.38  APTT 38*    BMP:  Recent Labs  02/02/17 1517 02/03/17 0814 02/04/17 0518 02/05/17 0607  NA 131* 132* 135 132*  K 4.9 4.3 4.5 4.3  CL 95* 101 104 101  CO2 25 22 23 23   GLUCOSE 120* 162* 138* 89  BUN 29* 20 16 11   CALCIUM 9.0 8.1* 8.1* 7.8*  CREATININE 0.90 0.77 0.77 0.63  GFRNONAA 56* >60 >60 >60  GFRAA >60 >60 >60 >60    LIVER FUNCTION TESTS:  Recent Labs  01/06/17 1526 01/11/17 1033 01/19/17 0440 02/02/17 1517  BILITOT 0.7 0.5 0.2* 1.3*  AST 20 19 18  62*  ALT 13* 12* 12* 46  ALKPHOS 43 43 35* 150*  PROT 6.1* 6.7 5.1* 7.7  ALBUMIN 2.4* 3.0* 1.9* 2.8*    Assessment and Plan: S/p sigmoid colectomy for colovesical fistula 01/14/17, now with right pelvic abscess drain placed 5/2. Patient is feeling better.  WBC improved to 14.2. Cultures still pending but growing abundant gram positive cocci Continue  routine drain care.  IR to follow.   Electronically Signed: Docia Barrier 02/05/2017, 2:05 PM   I spent a total of 15 Minutes at the the patient's bedside AND on the patient's hospital floor or unit, greater than 50% of which was counseling/coordinating care for pelvic abscess.

## 2017-02-05 NOTE — Progress Notes (Signed)
Report given to [facility], Therapist, sports.  Barbee Shropshire. Brigitte Pulse, RN

## 2017-02-05 NOTE — Discharge Summary (Signed)
[name] Discharge Summary   Patient ID: Charlene [name] MRN: [id number] DOB/AGE: 12/02/1927 83 y.o.  Admit date: 02/02/2017 Discharge date: 02/07/17   Admitting Diagnosis: Pelvic abscess SBO  Discharge Diagnosis Patient Active Problem List   Diagnosis Date Noted  . Pelvic abscess in female 02/02/2017  . Colovesical fistula with cancer s/p colectomy/repair 01/14/2017 01/18/2017  . 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 Protein-calorie malnutrition, severe 02/01/2016  . Depression 01/31/2016  . GERD (gastroesophageal reflux disease) 01/31/2016  . Hemorrhoids 01/31/2016  . Acute diverticulitis 05/05/2015  . Abnormal LFTs 05/05/2015  . Dyslipidemia 03/13/2015  . Leukocytosis 03/13/2015  . Acute renal failure ([place]) 03/13/2015    Consultants Sandi Mariscal MD - interventional radiology  Imaging: CT abdomen pelvis w contrast 02/02/17: 1. Enhancing hypodense abscess collection involving the right iliacus and psoas muscles measuring 4.1 x 5.5 x 6.7 cm. This appears to be contributing to a new small bowel obstruction possibly from inflammation and adhesions involving the adjacent distal ileum within the right hemipelvis. Mild presacral edema is noted which may be secondary to prior surgery and intervention. 2. Uncomplicated cholelithiasis. 3. Stable hepatic and renal cysts. 4. Aortic and branch vessel atherosclerosis.  Procedures Dr. Pascal Lux (02/03/17) - CT guided placement of a 10 Fr drainage catheter placement into the right lower abdomen/pelvis   Hospital Course:  Charlene [name] is an 83yo female s/p sigmoid colectomy for colovesical fistula on 7/67/3419 by Dr. Leighton Ruff, discharged from the hospital on 01/20/2017 to SNF in good condition, who presented to [facility] 02/02/17 with 5 days of loss of appetite, nausea, emesis, and abdominal pain.  Workup showed leukocytosis of 22K and CT scan positive for pelvic abscess with secondary  SBO.  Patient was admitted for broad spectrum antibiotics, fluid resuscitation, bowel rest, and percutaneous drainage of the abscess. This procedure was performed on 02/03/17 and patient tolerated well. Cultures were sent and eventually grew bacteroides and GPC. Antibiotics were narrowed to augmentin and flagyl.  Bowel function returned and diet was slowly advanced as tolerated.  Patient worked with therapies during this admission. On HD 5 the patient was voiding well, tolerating diet, working well with therapies, pain well controlled, vital signs stable and felt stable for discharge back to SNF.  Patient will need follow up in the drain clinic, and then with Dr. Marcello Moores. She will go home on PO antibiotics for 5 more days.   I have personally reviewed the patients medication history on the [place] controlled substance database.     Physical Exam: Gen: NAD CV: RRR Abd: soft   Allergies as of 02/07/2017   No Known Allergies     Medication List    TAKE these medications   acetaminophen 500 MG tablet Commonly known as:  TYLENOL Take 500 mg by mouth every 6 (six) hours as needed for moderate pain.   amoxicillin-clavulanate 875-125 MG tablet Commonly known as:  AUGMENTIN Take 1 tablet by mouth every 12 (twelve) hours.   docusate sodium 100 MG capsule Commonly known as:  COLACE Take 100 mg by mouth daily.   famotidine 10 MG chewable tablet Commonly known as:  PEPCID AC Chew 2 tablets (20 mg total) by mouth at bedtime as needed for heartburn.   lactose free nutrition Liqd Take 237 mLs by mouth daily.   LUBRICATING EYE DROPS OP Apply 1 drop to eye daily as needed (dry eyes).   metroNIDAZOLE 500 MG tablet Commonly known as:  FLAGYL Take 1 tablet (  500 mg total) by mouth every 12 (twelve) hours.   mirtazapine 7.5 MG tablet Commonly known as:  REMERON Take 7.5 mg by mouth at bedtime.   MULTIVITAMIN ADULT PO Take 1 tablet by mouth daily.   PRESERVISION AREDS 2 Caps Take 1 capsule by mouth  daily.   NASACORT AQ 55 MCG/ACT Aero nasal inhaler Generic drug:  triamcinolone Place 2 sprays into the nose daily as needed (for allergies).   ondansetron 4 MG tablet Commonly known as:  ZOFRAN Take 4 mg by mouth every 6 (six) hours as needed for nausea or vomiting.   protein supplement 6 g Powd Commonly known as:  RESOURCE BENEPROTEIN You can use whatever protein supplement that taste good to you.  Try to get 2-3 cans per day.  Do this till your ready to go home and take care of yourself.   traMADol 50 MG tablet Commonly known as:  ULTRAM Take 1-2 tablets (50-100 mg total) by mouth every 6 (six) hours as needed for moderate pain or severe pain.         Signed: Rosario Adie, MD  Col[facility]

## 2017-02-06 LAB — AEROBIC/ANAEROBIC CULTURE (SURGICAL/DEEP WOUND)

## 2017-02-06 LAB — AEROBIC/ANAEROBIC CULTURE W GRAM STAIN (SURGICAL/DEEP WOUND)

## 2017-02-06 MED ORDER — PANTOPRAZOLE SODIUM 40 MG PO TBEC
40.0000 mg | DELAYED_RELEASE_TABLET | Freq: Every day | ORAL | Status: DC
  Administered 2017-02-06: 40 mg via ORAL
  Filled 2017-02-06: qty 1

## 2017-02-06 NOTE — Progress Notes (Signed)
Patient ID: MATIE DIMAANO, female   DOB: Jul 30, 1928, 83 y.o.   MRN: [id number]  [facility] Progress Note     Subjective: CC- pelvic abscess, abdominal pain Patient states that she is feeling much better today. Abdomen still sore, but much improved from yesterday. Tolerating liquids. Denies n/v. +flatu, +BM. PT recommending SNF.  Objective: Vital signs in last 24 hours: Temp:  [97.7 F (36.5 C)-98.2 F (36.8 C)] 97.7 F (36.5 C) (05/05 0503) Pulse Rate:  [51-60] 51 (05/05 0503) Resp:  [16] 16 (05/05 0503) BP: (112-123)/(41-56) 123/41 (05/05 0503) SpO2:  [97 %-100 %] 97 % (05/05 0503) Last BM Date: 02/05/17  Intake/Output from previous day: 05/04 0701 - 05/05 0700 In: 1933.8 [P.O.:540; I.V.:1228.8; IV Piggyback:150] Out: 585 [Urine:550; Drains:35] Intake/Output this shift: No intake/output data recorded.  PE: Gen:  Alert, NAD, pleasant, appear much more comfortable today Card:  RRR, no M/G/R heard Pulm:  CTAB, no W/R/R, effort normal Abd: Soft, ND, well healed midline incision, +BS, mild TTP RLQ around drain, drain with trace purulent fluid in bag Ext:  No erythema, edema, or tenderness BUE/BLE   Lab Results:   Recent Labs  02/04/17 0518 02/05/17 0607  WBC 17.2* 14.2*  HGB 8.8* 8.9*  HCT 27.6* 27.1*  PLT 472* 407*   BMET  Recent Labs  02/04/17 0518 02/05/17 0607  NA 135 132*  K 4.5 4.3  CL 104 101  CO2 23 23  GLUCOSE 138* 89  BUN 16 11  CREATININE 0.77 0.63  CALCIUM 8.1* 7.8*   PT/INR  Recent Labs  02/03/17 1005  LABPROT 17.0*  INR 1.38   CMP     Component Value Date/Time   NA 132 (L) 02/05/2017 0607   K 4.3 02/05/2017 0607   CL 101 02/05/2017 0607   CO2 23 02/05/2017 0607   GLUCOSE 89 02/05/2017 0607   BUN 11 02/05/2017 0607   CREATININE 0.63 02/05/2017 0607   CALCIUM 7.8 (L) 02/05/2017 0607   PROT 7.7 02/02/2017 1517   ALBUMIN 2.8 (L) 02/02/2017 1517   AST 62 (H) 02/02/2017 1517   ALT 46 02/02/2017 1517   ALKPHOS 150  (H) 02/02/2017 1517   BILITOT 1.3 (H) 02/02/2017 1517   GFRNONAA >60 02/05/2017 0607   GFRAA >60 02/05/2017 0607   Lipase     Component Value Date/Time   LIPASE 25 02/02/2017 1517       Studies/Results: No results found.  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 Marcello Moores - CT scan shows enhancing hypodense abscess collection involving the right iliacus and psoas muscles measuring 4.1 x 5.5 x 6.7 cm - s/p perc drain by IR 5/2; gram stain showing gram positive cocci, culture pending Small bowel obstruction - due to abscess and adhesions - patient passing flatus, having bowel function Dehydration - improved Severe  malnutrition - prealbumin pending today  GERD - protonix  ID - zosyn 5/1>>day#4 FEN - IVF, full liquids advance as tolerated, Boost, continue miralax VTE - SCDs, lovenox  Plan - Continue drain, IV zosyn and follow culture. Advance to reg diet.  Wean O2. Continue PT/mobilization/IS. Planning d/c to SNF, hopefully tomorrow   LOS: 4 days    Rosario Adie, MD  Colorectal and [place] [facility]

## 2017-02-06 NOTE — Progress Notes (Signed)
Key Points: Use following P&T approved IV to PO non-antibiotic change policy.  Description contains the criteria that are approved Note: Policy Excludes:  Esophagectomy patientsPHARMACIST - PHYSICIAN COMMUNICATION DR:   CCS CONCERNING: IV to Oral Route Change Policy  RECOMMENDATION: This patient is receiving protonix by the intravenous route.  Based on criteria approved by the Pharmacy and Therapeutics Committee, the intravenous medication(s) is/are being converted to the equivalent oral dose form(s).   DESCRIPTION: These criteria include:  The patient is eating (either orally or via tube) and/or has been taking other orally administered medications for a least 24 hours  The patient has no evidence of active gastrointestinal bleeding or impaired GI absorption (gastrectomy, short bowel, patient on TNA or NPO).  If you have questions about this conversion, please contact the Pharmacy Department  []   [phone] )  [facility] []   [phone] )  [facility] Pontes  []   [phone] )  [facility] [x]   [phone] )  [place], [place], [facility] 02/06/2017 8:08 AM

## 2017-02-07 DIAGNOSIS — E43 Unspecified severe protein-calorie malnutrition: Secondary | ICD-10-CM | POA: Diagnosis not present

## 2017-02-07 DIAGNOSIS — E46 Unspecified protein-calorie malnutrition: Secondary | ICD-10-CM | POA: Diagnosis not present

## 2017-02-07 DIAGNOSIS — C187 Malignant neoplasm of sigmoid colon: Secondary | ICD-10-CM | POA: Diagnosis not present

## 2017-02-07 DIAGNOSIS — M6281 Muscle weakness (generalized): Secondary | ICD-10-CM | POA: Diagnosis not present

## 2017-02-07 DIAGNOSIS — N739 Female pelvic inflammatory disease, unspecified: Secondary | ICD-10-CM | POA: Diagnosis not present

## 2017-02-07 DIAGNOSIS — K5730 Diverticulosis of large intestine without perforation or abscess without bleeding: Secondary | ICD-10-CM | POA: Diagnosis not present

## 2017-02-07 DIAGNOSIS — K5792 Diverticulitis of intestine, part unspecified, without perforation or abscess without bleeding: Secondary | ICD-10-CM | POA: Diagnosis not present

## 2017-02-07 DIAGNOSIS — M7989 Other specified soft tissue disorders: Secondary | ICD-10-CM | POA: Diagnosis not present

## 2017-02-07 DIAGNOSIS — R1110 Vomiting, unspecified: Secondary | ICD-10-CM | POA: Diagnosis not present

## 2017-02-07 DIAGNOSIS — K651 Peritoneal abscess: Secondary | ICD-10-CM | POA: Diagnosis not present

## 2017-02-07 DIAGNOSIS — R600 Localized edema: Secondary | ICD-10-CM | POA: Diagnosis not present

## 2017-02-07 DIAGNOSIS — F329 Major depressive disorder, single episode, unspecified: Secondary | ICD-10-CM | POA: Diagnosis not present

## 2017-02-07 DIAGNOSIS — N939 Abnormal uterine and vaginal bleeding, unspecified: Secondary | ICD-10-CM | POA: Diagnosis not present

## 2017-02-07 DIAGNOSIS — R58 Hemorrhage, not elsewhere classified: Secondary | ICD-10-CM | POA: Diagnosis not present

## 2017-02-07 DIAGNOSIS — K56609 Unspecified intestinal obstruction, unspecified as to partial versus complete obstruction: Secondary | ICD-10-CM | POA: Diagnosis not present

## 2017-02-07 DIAGNOSIS — R278 Other lack of coordination: Secondary | ICD-10-CM | POA: Diagnosis not present

## 2017-02-07 DIAGNOSIS — C801 Malignant (primary) neoplasm, unspecified: Secondary | ICD-10-CM | POA: Diagnosis not present

## 2017-02-07 DIAGNOSIS — I82409 Acute embolism and thrombosis of unspecified deep veins of unspecified lower extremity: Secondary | ICD-10-CM | POA: Diagnosis not present

## 2017-02-07 DIAGNOSIS — R1084 Generalized abdominal pain: Secondary | ICD-10-CM | POA: Diagnosis not present

## 2017-02-07 DIAGNOSIS — Z4889 Encounter for other specified surgical aftercare: Secondary | ICD-10-CM | POA: Diagnosis not present

## 2017-02-07 DIAGNOSIS — K219 Gastro-esophageal reflux disease without esophagitis: Secondary | ICD-10-CM | POA: Diagnosis not present

## 2017-02-07 LAB — CBC
HCT: 27.2 % — ABNORMAL LOW (ref 36.0–46.0)
HEMOGLOBIN: 8.4 g/dL — AB (ref 12.0–15.0)
MCH: 26.4 pg (ref 26.0–34.0)
MCHC: 30.9 g/dL (ref 30.0–36.0)
MCV: 85.5 fL (ref 78.0–100.0)
PLATELETS: 429 10*3/uL — AB (ref 150–400)
RBC: 3.18 MIL/uL — ABNORMAL LOW (ref 3.87–5.11)
RDW: 14.7 % (ref 11.5–15.5)
WBC: 10.1 10*3/uL (ref 4.0–10.5)

## 2017-02-07 MED ORDER — AMOXICILLIN-POT CLAVULANATE 875-125 MG PO TABS
1.0000 | ORAL_TABLET | Freq: Two times a day (BID) | ORAL | Status: DC
  Administered 2017-02-07: 12:00:00 1 via ORAL
  Filled 2017-02-07: qty 1

## 2017-02-07 MED ORDER — METRONIDAZOLE 500 MG PO TABS
500.0000 mg | ORAL_TABLET | Freq: Two times a day (BID) | ORAL | Status: DC
  Administered 2017-02-07: 12:00:00 500 mg via ORAL
  Filled 2017-02-07: qty 1

## 2017-02-07 MED ORDER — AMOXICILLIN-POT CLAVULANATE 875-125 MG PO TABS: 1.0000 | ORAL_TABLET | Freq: Two times a day (BID) | ORAL | 0 refills | Status: DC

## 2017-02-07 MED ORDER — METRONIDAZOLE 500 MG PO TABS: 500.0000 mg | ORAL_TABLET | Freq: Two times a day (BID) | ORAL | 0 refills | Status: DC

## 2017-02-07 NOTE — Plan of Care (Signed)
Problem: Activity: Goal: Risk for activity intolerance will decrease Outcome: Adequate for Discharge To SNF

## 2017-02-07 NOTE — Social Work (Addendum)
Clinical Social Worker facilitated patient discharge including contacting patient family and facility to confirm patient discharge plans.  Clinical information faxed to facility and family agreeable with plan.  CSW arranged ambulance transport via [facility] to [facility].  RN to call [phone] report prior to discharge. Pt going to Room 3218.  Clinical Social Worker will sign off for now as social work intervention is no longer needed. Please consult [place] again if new need arises.  Elissa Hefty, LCSW Clinical Social Worker [phone]

## 2017-02-07 NOTE — Progress Notes (Signed)
Discharged from floor via stretcher for transport to [facility] by ambulance. Belongings, EMT & son with pt. No changes in assessment. Raelene Trew, [employer]

## 2017-02-07 NOTE — Discharge Instructions (Signed)
[facility] Instructions  1. DIET: Follow a light bland diet the first 24 hours after arrival home, such as soup, liquids, crackers, etc.  Be sure to include lots of fluids daily.  Avoid fast food or heavy meals as your are more likely to get nauseated.   2. Take your usually prescribed home medications unless otherwise directed. a. If you have any pain, it is helpful to get up and walk around, as it is usually from excess gas. b. If this is not helpful, you can take an over-the-counter pain medication.  Choose one of the following that works best for you: i. Naproxen (Aleve, etc)  Two 220mg  tabs twice a day ii. Ibuprofen (Advil, etc) Three 200mg  tabs four times a day (every meal & bedtime) iii. If you still have pain after using one of these, try your prescribed Tramadol 3. Keep drain site clean.  Empty daily  4. ACTIVITIES as tolerated:   5. You may resume regular (light) daily activities beginning the next day--such as daily self-care, walking, climbing stairs--gradually increasing activities as tolerated.    WHEN TO CALL us [phone]: 1. Fever over 101.5 F (38.5 C)  2. Severe abdominal or chest pain  3. Large amount of rectal bleeding, passing multiple blood clots  4. Dizziness or shortness of breath 5. Increasing nausea or vomiting   The clinic staff is available to answer your questions during regular business hours (8:30am-5pm).  Please dont hesitate to call and ask to speak to one of our nurses for clinical concerns.   If you have a medical emergency, go to the nearest emergency room or call 911.  A surgeon from [facility] is always on call at the [facility], [place], [place], [place], [place]  [address] ? MAIN: [phone] ? TOLL FREE: [phone] ?  FAX [phone] [email or web address]

## 2017-02-07 NOTE — Clinical Social Work Placement (Signed)
   CLINICAL SOCIAL WORK PLACEMENT  NOTE  Date:  02/07/2017  Patient Details  Name: Charlene [name] MRN: [id number] Date of Birth: 27-Aug-1928  Clinical Social Work is seeking post-discharge placement for this patient at the [place] level of care (*CSW will initial, date and re-position this form in  chart as items are completed):  Yes   Patient/family provided with [facility] Work Department's list of facilities offering this level of care within the geographic area requested by the patient (or if unable, by the patient's family).  Yes   Patient/family informed of their freedom to choose among providers that offer the needed level of care, that participate in Medicare, Medicaid or managed care program needed by the patient, have an available bed and are willing to accept the patient.  Yes   Patient/family informed of [facility]'s ownership interest in [facility] and [facility], as well as of the fact that they are under no obligation to receive care at these facilities.  PASRR submitted to EDS on       PASRR number received on   02/07/17.    Existing PASRR number confirmed on   02/07/17    FL2 transmitted to all facilities in geographic area requested by pt/family on       FL2 transmitted to all facilities within larger geographic area on       Patient informed that his/her managed care company has contracts with or will negotiate with certain facilities, including the following:        Yes   Patient/family informed of bed offers received.  Patient chooses bed at [facility]     Physician recommends and patient chooses bed at      Patient to be transferred to [facility] on 02/07/17.  Patient to be transferred to facility by [facility]     Patient family notified on 02/07/17 of transfer.  Name of family member notified:  Docia Barrier     PHYSICIAN Please prepare priority discharge summary, including  medications, Please sign DNR, Please prepare prescriptions     Additional Comment:    _______________________________________________ Normajean Baxter, LCSW 02/07/2017, 10:41 AM

## 2017-02-08 DIAGNOSIS — K56609 Unspecified intestinal obstruction, unspecified as to partial versus complete obstruction: Secondary | ICD-10-CM | POA: Diagnosis not present

## 2017-02-08 DIAGNOSIS — K5792 Diverticulitis of intestine, part unspecified, without perforation or abscess without bleeding: Secondary | ICD-10-CM | POA: Diagnosis not present

## 2017-02-08 DIAGNOSIS — K651 Peritoneal abscess: Secondary | ICD-10-CM | POA: Diagnosis not present

## 2017-02-08 DIAGNOSIS — C187 Malignant neoplasm of sigmoid colon: Secondary | ICD-10-CM | POA: Diagnosis not present

## 2017-02-09 ENCOUNTER — Other Ambulatory Visit: Payer: Self-pay | Admitting: General Surgery

## 2017-02-09 DIAGNOSIS — N739 Female pelvic inflammatory disease, unspecified: Secondary | ICD-10-CM

## 2017-02-10 DIAGNOSIS — N739 Female pelvic inflammatory disease, unspecified: Secondary | ICD-10-CM | POA: Diagnosis not present

## 2017-02-10 DIAGNOSIS — E46 Unspecified protein-calorie malnutrition: Secondary | ICD-10-CM | POA: Diagnosis not present

## 2017-02-10 DIAGNOSIS — R600 Localized edema: Secondary | ICD-10-CM | POA: Diagnosis not present

## 2017-02-11 ENCOUNTER — Ambulatory Visit
Admission: RE | Admit: 2017-02-11 | Discharge: 2017-02-11 | Disposition: A | Discharge status: Home / Self Care | Payer: Medicare Other | Source: Ambulatory Visit | Attending: General Surgery | Admitting: General Surgery

## 2017-02-11 DIAGNOSIS — I82409 Acute embolism and thrombosis of unspecified deep veins of unspecified lower extremity: Secondary | ICD-10-CM | POA: Diagnosis not present

## 2017-02-11 DIAGNOSIS — N739 Female pelvic inflammatory disease, unspecified: Secondary | ICD-10-CM

## 2017-02-11 DIAGNOSIS — C187 Malignant neoplasm of sigmoid colon: Secondary | ICD-10-CM | POA: Diagnosis not present

## 2017-02-11 DIAGNOSIS — K651 Peritoneal abscess: Secondary | ICD-10-CM | POA: Diagnosis not present

## 2017-02-11 DIAGNOSIS — K5730 Diverticulosis of large intestine without perforation or abscess without bleeding: Secondary | ICD-10-CM | POA: Diagnosis not present

## 2017-02-11 DIAGNOSIS — K56609 Unspecified intestinal obstruction, unspecified as to partial versus complete obstruction: Secondary | ICD-10-CM | POA: Diagnosis not present

## 2017-02-11 HISTORY — PX: IR RADIOLOGIST EVAL & MGMT: IMG5224

## 2017-02-11 MED ORDER — IOPAMIDOL (ISOVUE-300) INJECTION 61%
100.0000 mL | Freq: Once | INTRAVENOUS | Status: AC | PRN
  Administered 2017-02-11: 100 mL via INTRAVENOUS

## 2017-02-11 NOTE — Progress Notes (Signed)
Chief Complaint: Pelvic abscess, SP drainage  Referring Physician(s): Thomas,Alicia  History of Present Illness: Charlene [name] is a 83 y.o. female presenting as a scheduled follow up to Vascular & Interventional Radiology drain clinic for evaluation of a previously placed right pelvic abscess drain, performed while in the hospital.    She is here today with her son, transported from her rehab facility, [facility].   She has undergone a sigmoid colectomy and colovesical fistula repair performed by Dr. Marcello Moores on 01/14/2017.    A subsequent right pelvic abscess was drained 02/03/2017 with CT guidance.   Today she reports no fevers/rigors/chills.  She has had only trace output from the drain.   Of note, she had a portable right LE DVT study performed at the rehab center which was reported to show DVT.  This is unavailable for review.    She complains of right leg swelling and right foot pain.  She also states she has some right hip pain with movement/rehab.     Past Medical History:  Diagnosis Date  . Acute renal failure ([place])   . Allergy    seasonal  . BPPV (benign paroxysmal positional vertigo) 2012  . Depression   . Diverticulitis   . Dyslipidemia   . GERD (gastroesophageal reflux disease)   . Hemorrhoids   . Macular degeneration   . Urine frequency   . Uterine carcinoma [facility])     Past Surgical History:  Procedure Laterality Date  . ABDOMINAL HYSTERECTOMY    . COLECTOMY N/A 01/14/2017   Procedure: OPEN SIGMOIDECTOMY WITH REPAIR OF COLOVESICULAR FISTULA;  Surgeon: Leighton Ruff, MD;  Location: [facility] ORS;  Service: General;  Laterality: N/A;  . EYE SURGERY     cataract removal bilateral 2012  . FLEXIBLE SIGMOIDOSCOPY N/A 01/11/2017   Procedure: FLEXIBLE SIGMOIDOSCOPY;  Surgeon: Leighton Ruff, MD;  Location: [facility] ENDOSCOPY;  Service: Endoscopy;  Laterality: N/A;  . TONSILLECTOMY      Allergies: Patient has no known allergies.  Medications: Prior to  Admission medications   Medication Sig Start Date End Date Taking? Authorizing Provider  amoxicillin-clavulanate (AUGMENTIN) 875-125 MG tablet Take 1 tablet by mouth every 12 (twelve) hours. 01/11/87  Yes Leighton Ruff, MD  Carboxymethylcellul-Glycerin (LUBRICATING EYE DROPS OP) Apply 1 drop to eye daily as needed (dry eyes).   Yes [provider]  famotidine (PEPCID AC) 10 MG chewable tablet Chew 2 tablets (20 mg total) by mouth at bedtime as needed for heartburn. 01/20/17  Yes Earnstine Regal, PA-C  metroNIDAZOLE (FLAGYL) 500 MG tablet Take 1 tablet (500 mg total) by mouth every 12 (twelve) hours. 06/05/68  Yes Leighton Ruff, MD  Multiple Vitamins-Minerals (MULTIVITAMIN ADULT PO) Take 1 tablet by mouth daily.    Yes [provider]  Multiple Vitamins-Minerals (PRESERVISION AREDS 2) CAPS Take 1 capsule by mouth daily.   Yes [provider]  traMADol (ULTRAM) 50 MG tablet Take 1-2 tablets (50-100 mg total) by mouth every 6 (six) hours as needed for moderate pain or severe pain. 01/20/17  Yes Earnstine Regal, PA-C  acetaminophen (TYLENOL) 500 MG tablet Take 500 mg by mouth every 6 (six) hours as needed for moderate pain.     [provider]  docusate sodium (COLACE) 100 MG capsule Take 100 mg by mouth daily.    [provider]  lactose free nutrition (BOOST) LIQD Take 237 mLs by mouth daily.    [provider]  mirtazapine (REMERON) 7.5 MG tablet Take 7.5 mg  by mouth at bedtime.    [provider]  ondansetron (ZOFRAN) 4 MG tablet Take 4 mg by mouth every 6 (six) hours as needed for nausea or vomiting.    [provider]  protein supplement (RESOURCE BENEPROTEIN) 6 g POWD You can use whatever protein supplement that taste good to you.  Try to get 2-3 cans per day.  Do this till your ready to go home and take care of yourself. Patient not taking: Reported on 02/11/2017 01/20/17   Earnstine Regal, PA-C  triamcinolone (NASACORT AQ)  55 MCG/ACT AERO nasal inhaler Place 2 sprays into the nose daily as needed (for allergies).    [provider]     Family History  Problem Relation Age of Onset  . Heart disease Mother   . Diabetes Mother   . Stroke Father   . Diabetes Father   . Cancer Brother        lung cancer  . Heart disease Brother     Social History   Social History  . Marital status: Widowed    Spouse name: N/A  . Number of children: 2  . Years of education: N/A   Occupational History  . retired     Social History Main Topics  . Smoking status: Former Smoker    Quit date: 01/19/1973  . Smokeless tobacco: Never Used  . Alcohol use Yes     Comment: occ  . Drug use: No  . Sexual activity: Not on file   Other Topics Concern  . Not on file   Social History Narrative  . No narrative on file       Review of Systems: A 12 point ROS discussed and pertinent positives are indicated in the HPI above.  All other systems are negative.  Review of Systems  Vital Signs: BP (!) 152/61   Pulse 62   Temp 98.1 F (36.7 C) (Oral)   Resp 14   Ht 5' 4.5" (1.638 m)   Wt 132 lb (59.9 kg)   SpO2 97%   BMI 22.31 kg/m   Physical Exam  Targeted exam shows no concern for cellulitis at the drain site.  Small ostomy in the skin where the drain was removed. This was covered with a dressing.   Her right leg is asymmetrically enlarged compared to the left.  No erythema or discoloration.  Neg homan sign. No sig warmth.   Imaging: Ct Abdomen Pelvis W Contrast  Result Date: 02/11/2017 CLINICAL DATA:  83 year old female with a history of diverticulitis with open sigmoid colectomy and repair of a colovesicular fistula. This was performed 01/14/2017. She subsequently developed a right-sided pelvic abscess which was drained with CT guidance 02/03/2017. She was discharged home 02/05/2017, with follow-up imaging scheduled for today. EXAM: CT ABDOMEN AND PELVIS WITH CONTRAST TECHNIQUE: Multidetector CT imaging  of the abdomen and pelvis was performed using the standard protocol following bolus administration of intravenous contrast. CONTRAST:  114mL ISOVUE-300 IOPAMIDOL (ISOVUE-300) INJECTION 61% COMPARISON:  02/03/2017, 02/02/2017, 12/22/2016 FINDINGS: Lower chest: Atelectasis/scarring at the bilateral lung bases. Trace bilateral pleural effusions. Hepatobiliary: Unremarkable appearance of liver parenchyma. Cholelithiasis without associated inflammation. No intrahepatic or extrahepatic biliary ductal dilatation. Pancreas: Unremarkable appearance of the pancreas. Spleen: Unremarkable spleen Adrenals/Urinary Tract: Unremarkable adrenal glands. No evidence of right-sided hydronephrosis or nephrolithiasis. Bosniak 1 cyst on the lateral right renal cortex. No evidence of left-sided hydronephrosis or nephrolithiasis. No perinephric stranding. Nonenhancing/non complex cystic structure on the left renal cortex measures approximately 3 cm, compatible  with Bosniak 1 cyst. Unremarkable appearance of the urinary bladder. Stomach/Bowel: Hiatal hernia. Unremarkable appearance of stomach. No abnormally distended small bowel or colon. No transition point. Moderate stool burden.  Normal appendix. Surgical changes of the anatomic pelvis of primary anastomosis. Small bowel loops of the low anatomic pelvis on the right are intimately associated with the inflammatory changes adjacent to the right pelvic side wall. Unchanged position of the pigtail drainage catheter within the right pelvis along the pelvic side wall with no residual fluid collection. Inflammatory changes are present at the site of the catheter within the muscle on the mesenteric fat, with no extraluminal fluid or gas. Vascular/Lymphatic: Scattered calcifications of the abdominal aorta and iliac vasculature. Proximal common femoral vasculature patent. Expansion of the right common femoral vein with luminal thrombus. There is evidence of expansion of the proximal femoral vein at  the inferior margin of the exam involving the saphenous femoral junction. The thrombus extends in the external iliac vein to the inflammatory changes of the right pelvis. Vein not well evaluated at this site. Patency of the internal iliac vein and the common iliac vein. No evidence of expansion of the IVC. Unremarkable appearance of the left-sided iliac veins. Reproductive: Surgical changes of hysterectomy Other: Surgical scar along the midline abdomen. Minimal abdominal wall infiltration/ edema. Musculoskeletal: Multilevel degenerative changes of the thoracolumbar spine. No bony canal narrowing. Bilateral facet disease is worst in the L4-S1 levels. Degenerative disc changes are worst at the T12-L1 level. Mild degenerative hip disease. IMPRESSION: Unchanged position of the pigtail drainage catheter of the right pelvis with interval resolution of the abscess. Edema of the muscle and the adjacent mesenteric fat, presumably postoperative/postinflammatory. CT demonstrates DVT of the right common femoral vein and the proximal right femoral vein, with thrombus extending into the external iliac vein to the level of the right pelvic sidewall. Given the prior infection at this site, septic thrombophlebitis should be considered, or alternatively compression of the vein from the adjacent inflammatory changes. These results were called by telephone at the time of interpretation on 02/11/2017 at 69:79 am to Dr. Leighton Ruff , who verbally acknowledged these results. Small bowel loops intimately associated with the right anatomic sidewall, potentially secondary to adhesions. No evidence of bowel obstruction. Surgical changes of prior sigmoid colectomy and primary anastomosis. Aortic atherosclerosis. Electronically Signed   By: Corrie Mckusick D.O.   On: 02/11/2017 11:18   Ct Abdomen Pelvis W Contrast  Result Date: 02/02/2017 CLINICAL DATA:  Generalized abdominal pain and fatigue status post sigmoidectomy and colovesicular  fistula takedown. Low grade fever to 100.2 in the nursing home. Reports nausea and vomiting over the past 48 hours. EXAM: CT ABDOMEN AND PELVIS WITH CONTRAST TECHNIQUE: Multidetector CT imaging of the abdomen and pelvis was performed using the standard protocol following bolus administration of intravenous contrast. CONTRAST:  157mL ISOVUE-300 IOPAMIDOL (ISOVUE-300) INJECTION 61% COMPARISON:  12/22/2016 FINDINGS: Lower chest: Stable cardiomegaly without pericardial effusion. Subsegmental atelectasis in both lower lobes with trace right effusion. Hepatobiliary: 7 mm circumscribed hypodensity in the left hepatic lobe is unchanged statistically consistent with a cyst but too small to further characterize. No other suspicious hepatic lesions. No intra nor extrahepatic ductal dilatation. Gallstones are present along the dependent wall of the fundus. No evidence of acute cholecystitis. Pancreas: Unremarkable. No pancreatic ductal dilatation or surrounding inflammatory changes. Spleen: Normal in size without focal abnormality. Adrenals/Urinary Tract: Normal bilateral adrenal glands unstable water attenuating cysts both kidneys, the largest is in the upper pole the left  kidney measuring 3 cm in diameter within interpolar right renal cyst measuring 1 cm. The urinary bladder is physiologically distended without focal mass or calculus. Tiny focus of air along the nondependent wall is noted within possibly from instrumentation. No definite evidence of fistulous recurrence. Stomach/Bowel: The stomach is contracted in appearance. There is normal small bowel rotation. Dilated fluid and air-filled small bowel loops are seen from the third portion of the duodenum through distal ileum were there is a transition point adjacent to what appears to be an abscess measuring 4.1 x 5.5 x 6.7 cm involving the right iliacus and psoas muscles, series 2, image 61. The terminal ileum is decompressed. Stool is noted within large bowel. The patient  has undergone sigmoidectomy with new surgical clips noted in the left lower hemipelvis. Mild presacral edema and induration is noted posterior to the rectum which may reflect postop change. Vascular/Lymphatic: There is aortic atherosclerosis without evidence of aneurysm or dissection. No lymphadenopathy. Reproductive: Status post hysterectomy. Bilateral salpingo-oophorectomy. Other: No pneumoperitoneum. Musculoskeletal: No aggressive appearing lytic or blastic disease. IMPRESSION: 1. Enhancing hypodense abscess collection involving the right iliacus and psoas muscles measuring 4.1 x 5.5 x 6.7 cm. This appears to be contributing to a new small bowel obstruction possibly from inflammation and adhesions involving the adjacent distal ileum within the right hemipelvis. Mild presacral edema is noted which may be secondary to prior surgery and intervention. 2. Uncomplicated cholelithiasis. 3. Stable hepatic and renal cysts. 4. Aortic and branch vessel atherosclerosis. Electronically Signed   By: Ashley Royalty M.D.   On: 02/02/2017 17:39   Dg Cystogram  Result Date: 01/20/2017 CLINICAL DATA:  Colovesicular fistula. Recent repair, evaluate for leak. EXAM: CYSTOGRAM TECHNIQUE: After catheterization of the urinary bladder following sterile technique the bladder was filled with 250 mL Cysto-Hypaque 30% by drip infusion. Serial spot images were obtained during bladder filling and post draining. FLUOROSCOPY TIME:  Fluoroscopy Time:  42 seconds Radiation Exposure Index (if provided by the fluoroscopic device): 71.2 mGy Number of Acquired Spot Images: 4 COMPARISON:  12/22/2016 abdominal CT FINDINGS: Scout image shows pelvic clips, surgical drain, and rectal stool. Patient was filled to maximal tolerance. The bladder shows postoperative deformity of the superior and posterior aspect, site of repaired fistula. No luminal irregularity or mass is noted. No leak or vesicoureteral reflux. IMPRESSION: Negative postoperative cystogram.   Negative for leak. Electronically Signed   By: Monte Fantasia M.D.   On: 01/20/2017 12:40   Ct Image Guided Drainage By Percutaneous Catheter  Result Date: 02/03/2017 INDICATION: History of diverticulitis, post sigmoid colectomy and takedown of colovesicular fistula by Dr. Marcello Moores on 01/14/2017 however the patient returned to the hospital with right lower quadrant abdominal pain and low-grade fever with CT scan of the abdomen pelvis performed 02/02/2017 demonstrating development of indeterminate fluid collection within the right pelvic sidewall adjacent to several pelvic side wall surgical clips. Request made for CT-guided percutaneous drainage catheter placement for infection source control. EXAM: CT IMAGE GUIDED DRAINAGE BY PERCUTANEOUS CATHETER COMPARISON:  CT abdomen pelvis - 02/02/2017; 12/15/2026 MEDICATIONS: The patient is currently admitted to the hospital and receiving intravenous antibiotics. The antibiotics were administered within an appropriate time frame prior to the initiation of the procedure. ANESTHESIA/SEDATION: Moderate (conscious) sedation was employed during this procedure. A total of Versed 1 mg and Fentanyl 25 mcg was administered intravenously. Moderate Sedation Time: 15 minutes. The patient's level of consciousness and vital signs were monitored continuously by radiology nursing throughout the procedure under my direct supervision. CONTRAST:  None COMPLICATIONS: None immediate. PROCEDURE: Informed written consent was obtained from the patient after a discussion of the risks, benefits and alternatives to treatment. The patient was placed supine on the CT gantry and a pre procedural CT was performed re-demonstrating the known abscess/fluid collection within the right hemipelvis with dominant ill-defined component measuring approximately 5.0 x 3.7 cm (image 31, series 2). The procedure was planned. A timeout was performed prior to the initiation of the procedure. The skin overlying the  anterior aspect the right lower abdomen/pelvis was prepped and draped in the usual sterile fashion. The overlying soft tissues were anesthetized with 1% lidocaine with epinephrine. Appropriate trajectory was planned with the use of a 22 gauge spinal needle. An 18 gauge trocar needle was advanced into the abscess/fluid collection and a short Amplatz super stiff wire was coiled within the collection. Appropriate positioning was confirmed with a limited CT scan. The tract was serially dilated allowing placement of a 10 [place] all-purpose drainage catheter. Appropriate positioning was confirmed with a limited postprocedural CT scan. Approximately 75 cc of purulent fluid was aspirated. The tube was connected to a drainage bag and sutured in place. A dressing was placed. The patient tolerated the procedure well without immediate post procedural complication. IMPRESSION: Successful CT guided placement of a 10 French all purpose drain catheter into the right lower abdomen/pelvis with aspiration of 75 mL of purulent fluid. Samples were sent to the laboratory as requested by the ordering clinical team. Electronically Signed   By: Sandi Mariscal M.D.   On: 02/03/2017 16:15    Labs:  CBC:  Recent Labs  02/03/17 0814 02/04/17 0518 02/05/17 0607 02/07/17 0429  WBC 20.8* 17.2* 14.2* 10.1  HGB 8.6* 8.8* 8.9* 8.4*  HCT 26.2* 27.6* 27.1* 27.2*  PLT 501* 472* 407* 429*    COAGS:  Recent Labs  02/03/17 1005  INR 1.38  APTT 38*    BMP:  Recent Labs  02/02/17 1517 02/03/17 0814 02/04/17 0518 02/05/17 0607  NA 131* 132* 135 132*  K 4.9 4.3 4.5 4.3  CL 95* 101 104 101  CO2 25 22 23 23   GLUCOSE 120* 162* 138* 89  BUN 29* 20 16 11   CALCIUM 9.0 8.1* 8.1* 7.8*  CREATININE 0.90 0.77 0.77 0.63  GFRNONAA 56* >60 >60 >60  GFRAA >60 >60 >60 >60    LIVER FUNCTION TESTS:  Recent Labs  01/06/17 1526 01/11/17 1033 01/19/17 0440 02/02/17 1517  BILITOT 0.7 0.5 0.2* 1.3*  AST 20 19 18  62*  ALT 13* 12*  12* 46  ALKPHOS 43 43 35* 150*  PROT 6.1* 6.7 5.1* 7.7  ALBUMIN 2.4* 3.0* 1.9* 2.8*    TUMOR MARKERS: No results for input(s): AFPTM, CEA, CA199, CHROMGRNA in the last 8760 hours.  Assessment and Plan:  Ms Andis is 83 year old female with history of right pelvic abscess after a sigmoid colectomy and fistula repair, drained via CT guidance.   She has had no significant output with resolved abscess on CT.  The case was reviewed with her surgeon Dr. Marcello Moores and the drain was removed.    Incidental DVT was discovered on the CT in the right CFV, proximal femoral v, and into the right iliac system.  There is report of portable DVT study, but I cannot view these, and I cannot confirm with the patient or her son whether she has received a dose of anti-coagulation at this point.  These details were discussed with Dr. Marcello Moores' office staff member, Maudie Mercury.  We will not plan on scheduling a follow up visit.   Thank you for this interesting consult.  I greatly enjoyed meeting Charlene [name] and look forward to participating in their care.  A copy of this report was sent to the requesting provider on this date.  Electronically Signed: Corrie Mckusick 02/11/2017, 11:38 AM   I spent a total of    15 Minutes in face to face in clinical consultation, greater than 50% of which was counseling/coordinating care for pelvic abscess, drain removal.

## 2017-02-17 ENCOUNTER — Other Ambulatory Visit: Payer: Self-pay | Admitting: Internal Medicine

## 2017-02-17 DIAGNOSIS — K651 Peritoneal abscess: Secondary | ICD-10-CM | POA: Diagnosis not present

## 2017-02-17 DIAGNOSIS — C187 Malignant neoplasm of sigmoid colon: Secondary | ICD-10-CM | POA: Diagnosis not present

## 2017-02-17 DIAGNOSIS — N939 Abnormal uterine and vaginal bleeding, unspecified: Secondary | ICD-10-CM | POA: Diagnosis not present

## 2017-02-17 DIAGNOSIS — I82409 Acute embolism and thrombosis of unspecified deep veins of unspecified lower extremity: Secondary | ICD-10-CM | POA: Diagnosis not present

## 2017-02-18 DIAGNOSIS — N939 Abnormal uterine and vaginal bleeding, unspecified: Secondary | ICD-10-CM | POA: Diagnosis not present

## 2017-02-18 DIAGNOSIS — C187 Malignant neoplasm of sigmoid colon: Secondary | ICD-10-CM | POA: Diagnosis not present

## 2017-02-18 DIAGNOSIS — R58 Hemorrhage, not elsewhere classified: Secondary | ICD-10-CM | POA: Diagnosis not present

## 2017-02-18 DIAGNOSIS — I82409 Acute embolism and thrombosis of unspecified deep veins of unspecified lower extremity: Secondary | ICD-10-CM | POA: Diagnosis not present

## 2017-02-24 DIAGNOSIS — K651 Peritoneal abscess: Secondary | ICD-10-CM | POA: Diagnosis not present

## 2017-02-24 DIAGNOSIS — C187 Malignant neoplasm of sigmoid colon: Secondary | ICD-10-CM | POA: Diagnosis not present

## 2017-02-24 DIAGNOSIS — R58 Hemorrhage, not elsewhere classified: Secondary | ICD-10-CM | POA: Diagnosis not present

## 2017-02-24 DIAGNOSIS — I82409 Acute embolism and thrombosis of unspecified deep veins of unspecified lower extremity: Secondary | ICD-10-CM | POA: Diagnosis not present

## 2017-02-25 ENCOUNTER — Ambulatory Visit
Admission: RE | Admit: 2017-02-25 | Discharge: 2017-02-25 | Disposition: A | Discharge status: Home / Self Care | Payer: Medicare Other | Source: Ambulatory Visit | Attending: Internal Medicine | Admitting: Internal Medicine

## 2017-02-25 DIAGNOSIS — N939 Abnormal uterine and vaginal bleeding, unspecified: Secondary | ICD-10-CM

## 2017-03-05 DIAGNOSIS — K219 Gastro-esophageal reflux disease without esophagitis: Secondary | ICD-10-CM | POA: Diagnosis not present

## 2017-03-05 DIAGNOSIS — F329 Major depressive disorder, single episode, unspecified: Secondary | ICD-10-CM | POA: Diagnosis not present

## 2017-03-05 DIAGNOSIS — C187 Malignant neoplasm of sigmoid colon: Secondary | ICD-10-CM | POA: Diagnosis not present

## 2017-03-05 DIAGNOSIS — K651 Peritoneal abscess: Secondary | ICD-10-CM | POA: Diagnosis not present

## 2017-03-05 DIAGNOSIS — E43 Unspecified severe protein-calorie malnutrition: Secondary | ICD-10-CM | POA: Diagnosis not present

## 2017-03-05 DIAGNOSIS — R278 Other lack of coordination: Secondary | ICD-10-CM | POA: Diagnosis not present

## 2017-03-05 DIAGNOSIS — K56609 Unspecified intestinal obstruction, unspecified as to partial versus complete obstruction: Secondary | ICD-10-CM | POA: Diagnosis not present

## 2017-03-05 DIAGNOSIS — Z4889 Encounter for other specified surgical aftercare: Secondary | ICD-10-CM | POA: Diagnosis not present

## 2017-03-05 DIAGNOSIS — R1110 Vomiting, unspecified: Secondary | ICD-10-CM | POA: Diagnosis not present

## 2017-03-05 DIAGNOSIS — K5792 Diverticulitis of intestine, part unspecified, without perforation or abscess without bleeding: Secondary | ICD-10-CM | POA: Diagnosis not present

## 2017-03-05 DIAGNOSIS — I82409 Acute embolism and thrombosis of unspecified deep veins of unspecified lower extremity: Secondary | ICD-10-CM | POA: Diagnosis not present

## 2017-03-05 DIAGNOSIS — R58 Hemorrhage, not elsewhere classified: Secondary | ICD-10-CM | POA: Diagnosis not present

## 2017-03-05 DIAGNOSIS — M6281 Muscle weakness (generalized): Secondary | ICD-10-CM | POA: Diagnosis not present

## 2017-03-09 ENCOUNTER — Telehealth: Payer: Self-pay | Admitting: Oncology

## 2017-03-09 ENCOUNTER — Ambulatory Visit: Payer: Medicare Other | Admitting: Hematology

## 2017-03-09 DIAGNOSIS — K56609 Unspecified intestinal obstruction, unspecified as to partial versus complete obstruction: Secondary | ICD-10-CM | POA: Diagnosis not present

## 2017-03-09 DIAGNOSIS — I82409 Acute embolism and thrombosis of unspecified deep veins of unspecified lower extremity: Secondary | ICD-10-CM | POA: Diagnosis not present

## 2017-03-09 DIAGNOSIS — R1110 Vomiting, unspecified: Secondary | ICD-10-CM | POA: Diagnosis not present

## 2017-03-09 DIAGNOSIS — C187 Malignant neoplasm of sigmoid colon: Secondary | ICD-10-CM | POA: Diagnosis not present

## 2017-03-09 NOTE — Telephone Encounter (Signed)
Spoke to [place] at [place] to give an appt for the pt. Pt has been scheduled to see Dr. Benay Spice on 6/18 at 2pm. Aware to have the pt arrive 30 minutes early. [place] voiced understanding.

## 2017-03-12 DIAGNOSIS — K5790 Diverticulosis of intestine, part unspecified, without perforation or abscess without bleeding: Secondary | ICD-10-CM | POA: Diagnosis not present

## 2017-03-12 DIAGNOSIS — E43 Unspecified severe protein-calorie malnutrition: Secondary | ICD-10-CM | POA: Diagnosis not present

## 2017-03-12 DIAGNOSIS — Z7951 Long term (current) use of inhaled steroids: Secondary | ICD-10-CM | POA: Diagnosis not present

## 2017-03-12 DIAGNOSIS — F329 Major depressive disorder, single episode, unspecified: Secondary | ICD-10-CM | POA: Diagnosis not present

## 2017-03-12 DIAGNOSIS — Z9181 History of falling: Secondary | ICD-10-CM | POA: Diagnosis not present

## 2017-03-12 DIAGNOSIS — Z7901 Long term (current) use of anticoagulants: Secondary | ICD-10-CM | POA: Diagnosis not present

## 2017-03-12 DIAGNOSIS — R32 Unspecified urinary incontinence: Secondary | ICD-10-CM | POA: Diagnosis not present

## 2017-03-12 DIAGNOSIS — K219 Gastro-esophageal reflux disease without esophagitis: Secondary | ICD-10-CM | POA: Diagnosis not present

## 2017-03-12 DIAGNOSIS — M6281 Muscle weakness (generalized): Secondary | ICD-10-CM | POA: Diagnosis not present

## 2017-03-16 DIAGNOSIS — R627 Adult failure to thrive: Secondary | ICD-10-CM | POA: Diagnosis not present

## 2017-03-16 DIAGNOSIS — R030 Elevated blood-pressure reading, without diagnosis of hypertension: Secondary | ICD-10-CM | POA: Diagnosis not present

## 2017-03-16 DIAGNOSIS — D649 Anemia, unspecified: Secondary | ICD-10-CM | POA: Diagnosis not present

## 2017-03-16 DIAGNOSIS — E46 Unspecified protein-calorie malnutrition: Secondary | ICD-10-CM | POA: Diagnosis not present

## 2017-03-16 DIAGNOSIS — Z7901 Long term (current) use of anticoagulants: Secondary | ICD-10-CM | POA: Diagnosis not present

## 2017-03-16 DIAGNOSIS — F325 Major depressive disorder, single episode, in full remission: Secondary | ICD-10-CM | POA: Diagnosis not present

## 2017-03-16 DIAGNOSIS — C188 Malignant neoplasm of overlapping sites of colon: Secondary | ICD-10-CM | POA: Diagnosis not present

## 2017-03-16 DIAGNOSIS — L0291 Cutaneous abscess, unspecified: Secondary | ICD-10-CM | POA: Diagnosis not present

## 2017-03-16 DIAGNOSIS — R3129 Other microscopic hematuria: Secondary | ICD-10-CM | POA: Diagnosis not present

## 2017-03-16 DIAGNOSIS — I8290 Acute embolism and thrombosis of unspecified vein: Secondary | ICD-10-CM | POA: Diagnosis not present

## 2017-03-19 ENCOUNTER — Encounter: Payer: Self-pay | Admitting: Interventional Radiology

## 2017-03-22 ENCOUNTER — Ambulatory Visit (HOSPITAL_BASED_OUTPATIENT_CLINIC_OR_DEPARTMENT_OTHER): Payer: Medicare Other | Admitting: Oncology

## 2017-03-22 ENCOUNTER — Telehealth: Payer: Self-pay | Admitting: Oncology

## 2017-03-22 VITALS — BP 131/97 | HR 67 | Temp 97.8°F | Resp 18 | Ht 64.5 in | Wt 127.6 lb

## 2017-03-22 DIAGNOSIS — C187 Malignant neoplasm of sigmoid colon: Secondary | ICD-10-CM | POA: Diagnosis not present

## 2017-03-22 DIAGNOSIS — Z8542 Personal history of malignant neoplasm of other parts of uterus: Secondary | ICD-10-CM | POA: Diagnosis not present

## 2017-03-22 DIAGNOSIS — C19 Malignant neoplasm of rectosigmoid junction: Secondary | ICD-10-CM

## 2017-03-22 DIAGNOSIS — I82401 Acute embolism and thrombosis of unspecified deep veins of right lower extremity: Secondary | ICD-10-CM | POA: Diagnosis not present

## 2017-03-22 NOTE — Progress Notes (Signed)
Charlene [name] Patient Consult   Referring MD: Eulamae [place] 83 y.o.  09-27-1928    Reason for Referral: Colon cancer   HPI: She reports a history of recurrent diverticulitis for the past 3-4 years. A CT of the abdomen and pelvis 11/04/2016 revealed evidence of sigmoid diverticulitis with a small diverticular abscess. A repeat CT 12/22/2016 revealed no suspicious hepatic lesion. Focal masslike thickening was noted at the lateral wall of the proximal rectum and distal sigmoid. A portion of the thickening extends orally to come in direct contact with the apex of the vaginal vault and posterior aspect of the bladder. Extensive colonic diverticulosis with surrounding inflammatory changes were noted. No lymphadenopathy.  She was referred to Dr. Marcello Moores and was taken to a sigmoidoscopy procedure 01/11/2017. An intrinsic stenosis was found in the rectosigmoid colon and was not traversed. The area was congested and friable. A biopsy revealed focal erosion and a reactive lymphoid aggregate. No evidence of malignancy.  She was taken to the operating room on 01/14/2017. A colovesical fistula was noted with significant pelvic inflammation. The sigmoid colon was adherent to the left pelvic sidewall and bladder with a large inflammatory mass anteriorly. A colovesical fistula was noted. The bladder was closed with sutures.  The pathology ([id number]) revealed adenocarcinoma of the sigmoid colon, grade 3, tumor extended to the serosa. Perineural invasion and lymphovascular invasion were identified. One of 30 lymph nodes contained metastatic adenocarcinoma. No tumor deposit. The circumferential tumor margin was positive. The tumor returned MSI-stable with no loss of mismatch repair protein expression.  She was readmitted 02/05/2017 with a pelvic abscess. She underwent placement of a percutaneous drain and completed a course of antibiotics. She was subsequently diagnosed with a  right lower showed a deep vein thrombosis and is now maintained on Eliquis. She reports intermittent gross hematuria since starting anticoagulation therapy.  She has returned home from the skilled nursing facility.  Past Medical History:  Diagnosis Date  . G2 P2    . Allergy    seasonal  . BPPV (benign paroxysmal positional vertigo) 2012  . Depression   . Diverticulitis   . Dyslipidemia   . GERD (gastroesophageal reflux disease)   . Hemorrhoids   . Macular degeneration   . Urine frequency   . Uterine carcinoma ([place])     .  Colon cancer, sigmoid (T4 N1)                                                                                          April 2018  Past Surgical History:  Procedure Laterality Date  . ABDOMINAL HYSTERECTOMY    . COLECTOMY N/A 01/14/2017   Procedure: OPEN SIGMOIDECTOMY WITH REPAIR OF COLOVESICULAR FISTULA;  Surgeon: Leighton Ruff, MD;  Location: [facility];  Service: General;  Laterality: N/A;  . EYE SURGERY     cataract removal bilateral 2012  . FLEXIBLE SIGMOIDOSCOPY N/A 01/11/2017   Procedure: FLEXIBLE SIGMOIDOSCOPY;  Surgeon: Leighton Ruff, MD;  Location: [facility];  Service: Endoscopy;  Laterality: N/A;  . IR RADIOLOGIST EVAL & MGMT  02/11/2017  . TONSILLECTOMY  Medications: Reviewed  Allergies: No Known Allergies  Family history: A brother had lung cancer and was a smoker. No other family history of cancer.  Social History:   She lives alone in [place]. She worked on a Education officer, museum at [employer]. She quit smoking cigarettes 40 years ago. She reports occasional alcohol use. No transfusion history. No risk factor for HIV or hepatitis.  ROS:   Positives include: Anorexia, 20 pound weight loss, nausea/vomiting prior to surgery, hematuria since starting anticoagulation therapy, low abdominal pain and constipation prior to the sigmoid colectomy  A complete ROS was otherwise negative.  Physical Exam:  Blood pressure (!) 131/97,  pulse 67, temperature 97.8 F (36.6 C), temperature source Oral, resp. rate 18, height 5' 4.5" (1.638 m), weight 127 lb 9.6 oz (57.9 kg), SpO2 97 %.  HEENT: Oropharynx without visible mass, neck without mass Lungs: Clear bilaterally Cardiac: Regular rate and rhythm Abdomen: No hepatosplenomegaly, no mass, healed surgical incision, nontender  Vascular: No leg edema Lymph nodes: No cervical, supraclavicular, or inguinal nodes. "Shotty "bilateral axillary nodes Neurologic: Alert and oriented, the motor exam appears intact in the upper and lower extremities Skin: No rash Musculoskeletal: No spine tenderness   LAB:  CBC  Lab Results  Component Value Date   WBC 10.1 02/07/2017   HGB 8.4 (L) 02/07/2017   HCT 27.2 (L) 02/07/2017   MCV 85.5 02/07/2017   PLT 429 (H) 02/07/2017   NEUTROABS 9.1 (H) 01/06/2017        CMP     Component Value Date/Time   NA 132 (L) 02/05/2017 0607   K 4.3 02/05/2017 0607   CL 101 02/05/2017 0607   CO2 23 02/05/2017 0607   GLUCOSE 89 02/05/2017 0607   BUN 11 02/05/2017 0607   CREATININE 0.63 02/05/2017 0607   CALCIUM 7.8 (L) 02/05/2017 0607   PROT 7.7 02/02/2017 1517   ALBUMIN 2.8 (L) 02/02/2017 1517   AST 62 (H) 02/02/2017 1517   ALT 46 02/02/2017 1517   ALKPHOS 150 (H) 02/02/2017 1517   BILITOT 1.3 (H) 02/02/2017 1517   GFRNONAA >60 02/05/2017 0607   GFRAA >60 02/05/2017 7425       Imaging:  CT images 12/22/2016-reviewed    Assessment/Plan:   1. Colon cancer, sigmoid, stage IIIB (T4,N1), status post a sigmoid colectomy 01/14/2017  MSI-stable, no loss of mismatch repair protein expression  Positive circumferential resection margin  Colovesical fistula repaired at the time of surgery  CT abdomen/pelvis 12/22/2016 with distal sigmoid/proximal rectal mass extending to the apex of the vaginal vault and posterior bladder, no evidence of metastatic disease 2. History of uterine cancer  3.   Diverticulitis  4.   Right lower  extremity DVT-on apixaban  5.   Postoperative abscess, status post percutaneous drain, May 2018   Disposition:   Charlene [name] is recovering from a sigmoid colectomy/postoperative abscess. She has been diagnosed with stage III colon cancer. I discussed the prognosis and reviewed the details of the surgical pathology report with Charlene [name] and her son.  She has a significant risk of developing recurrent colon cancer over the next few years. The standard of care is to recommend adjuvant chemotherapy in this setting. I recommend capecitabine. We reviewed the potential toxicities associated with capecitabine including the chance for nausea, mucositis, diarrhea, and hematologic toxicity. We discussed the rash, hyperpigmentation, sun sensitivity, and hand/foot syndrome associated with capecitabine.  She is undecided on adjuvant therapy. She will attend a chemotherapy teaching class and make a  decision on adjuvant chemotherapy later this week. I explained the benefit of adjuvant therapy may be less in her case as she is greater than 2 months out from surgery.  We will check a CEA when she returns for the chemotherapy teaching class. She will return for an office visit in 3 months, sooner if she decides to proceed with adjuvant therapy.  I will discuss her case at the GI tumor conference on 03/31/2017.  She does not appear to have hereditary non-polyposis colon cancer syndrome, but her family members are at increased risk of developing colon cancer and should receive appropriate screening.  50 minutes were spent with the patient today. The majority of the time was used for counseling and coordination of care.    Donneta Romberg, MD  03/22/2017, 5:09 PM

## 2017-03-22 NOTE — Telephone Encounter (Signed)
Scheduled appt per 6/18 los - Gave patient AVS and calender per LOS.  

## 2017-03-23 ENCOUNTER — Other Ambulatory Visit: Payer: Medicare Other

## 2017-03-23 ENCOUNTER — Other Ambulatory Visit (HOSPITAL_BASED_OUTPATIENT_CLINIC_OR_DEPARTMENT_OTHER): Payer: Medicare Other

## 2017-03-23 ENCOUNTER — Encounter: Payer: Self-pay | Admitting: *Deleted

## 2017-03-23 DIAGNOSIS — C187 Malignant neoplasm of sigmoid colon: Secondary | ICD-10-CM

## 2017-03-23 DIAGNOSIS — C19 Malignant neoplasm of rectosigmoid junction: Secondary | ICD-10-CM

## 2017-03-23 LAB — CEA (IN HOUSE-CHCC): CEA (CHCC-IN HOUSE): 7.77 ng/mL — AB (ref 0.00–5.00)

## 2017-03-24 DIAGNOSIS — E43 Unspecified severe protein-calorie malnutrition: Secondary | ICD-10-CM | POA: Diagnosis not present

## 2017-03-24 DIAGNOSIS — K5790 Diverticulosis of intestine, part unspecified, without perforation or abscess without bleeding: Secondary | ICD-10-CM | POA: Diagnosis not present

## 2017-03-24 DIAGNOSIS — R32 Unspecified urinary incontinence: Secondary | ICD-10-CM | POA: Diagnosis not present

## 2017-03-24 DIAGNOSIS — F329 Major depressive disorder, single episode, unspecified: Secondary | ICD-10-CM | POA: Diagnosis not present

## 2017-03-24 DIAGNOSIS — K219 Gastro-esophageal reflux disease without esophagitis: Secondary | ICD-10-CM | POA: Diagnosis not present

## 2017-03-24 DIAGNOSIS — M6281 Muscle weakness (generalized): Secondary | ICD-10-CM | POA: Diagnosis not present

## 2017-03-25 ENCOUNTER — Telehealth: Payer: Self-pay | Admitting: *Deleted

## 2017-03-25 DIAGNOSIS — F329 Major depressive disorder, single episode, unspecified: Secondary | ICD-10-CM | POA: Diagnosis not present

## 2017-03-25 DIAGNOSIS — R32 Unspecified urinary incontinence: Secondary | ICD-10-CM | POA: Diagnosis not present

## 2017-03-25 DIAGNOSIS — M546 Pain in thoracic spine: Secondary | ICD-10-CM | POA: Diagnosis not present

## 2017-03-25 DIAGNOSIS — M6281 Muscle weakness (generalized): Secondary | ICD-10-CM | POA: Diagnosis not present

## 2017-03-25 DIAGNOSIS — K5790 Diverticulosis of intestine, part unspecified, without perforation or abscess without bleeding: Secondary | ICD-10-CM | POA: Diagnosis not present

## 2017-03-25 DIAGNOSIS — M9901 Segmental and somatic dysfunction of cervical region: Secondary | ICD-10-CM | POA: Diagnosis not present

## 2017-03-25 DIAGNOSIS — K219 Gastro-esophageal reflux disease without esophagitis: Secondary | ICD-10-CM | POA: Diagnosis not present

## 2017-03-25 DIAGNOSIS — M9902 Segmental and somatic dysfunction of thoracic region: Secondary | ICD-10-CM | POA: Diagnosis not present

## 2017-03-25 DIAGNOSIS — M542 Cervicalgia: Secondary | ICD-10-CM | POA: Diagnosis not present

## 2017-03-25 DIAGNOSIS — E43 Unspecified severe protein-calorie malnutrition: Secondary | ICD-10-CM | POA: Diagnosis not present

## 2017-03-25 NOTE — Telephone Encounter (Signed)
"  This is Charlene [name]. I was with my mother and need clarification of what Dr. Benay Spice said the family is at risk of.  I see Dr. Isabelle Course and would like to convey this to him."  Shared the following information as noted with patient's 03-22-2017 F/U visit    Your mom does not appear to have hereditary non-polyposis colon cancer syndrome, but her family members are at increased risk of developing colon cancer and should receive appropriate screening.  No further questions.

## 2017-03-26 ENCOUNTER — Telehealth: Payer: Self-pay | Admitting: *Deleted

## 2017-03-26 ENCOUNTER — Telehealth: Payer: Self-pay

## 2017-03-26 DIAGNOSIS — C19 Malignant neoplasm of rectosigmoid junction: Secondary | ICD-10-CM

## 2017-03-26 DIAGNOSIS — R32 Unspecified urinary incontinence: Secondary | ICD-10-CM | POA: Diagnosis not present

## 2017-03-26 DIAGNOSIS — M6281 Muscle weakness (generalized): Secondary | ICD-10-CM | POA: Diagnosis not present

## 2017-03-26 DIAGNOSIS — F329 Major depressive disorder, single episode, unspecified: Secondary | ICD-10-CM | POA: Diagnosis not present

## 2017-03-26 DIAGNOSIS — K219 Gastro-esophageal reflux disease without esophagitis: Secondary | ICD-10-CM | POA: Diagnosis not present

## 2017-03-26 DIAGNOSIS — E43 Unspecified severe protein-calorie malnutrition: Secondary | ICD-10-CM | POA: Diagnosis not present

## 2017-03-26 DIAGNOSIS — K5790 Diverticulosis of intestine, part unspecified, without perforation or abscess without bleeding: Secondary | ICD-10-CM | POA: Diagnosis not present

## 2017-03-26 NOTE — Telephone Encounter (Signed)
-----   Message from Ladell Pier, MD sent at 03/25/2017  7:41 PM EDT ----- Please call patient, did she decide on whether to take adjuvant therapy? CEA- colon cancer marker mildly elevated, repeat 2 weeks and if elevated recommend CTs I can see her again to discuss if she would like

## 2017-03-26 NOTE — Telephone Encounter (Signed)
Unable to leave message for pt d/t pt.'s voicemail has not been set up.  Will attempt calling pt again next week.

## 2017-03-29 DIAGNOSIS — M546 Pain in thoracic spine: Secondary | ICD-10-CM | POA: Diagnosis not present

## 2017-03-29 DIAGNOSIS — M9901 Segmental and somatic dysfunction of cervical region: Secondary | ICD-10-CM | POA: Diagnosis not present

## 2017-03-29 DIAGNOSIS — M9902 Segmental and somatic dysfunction of thoracic region: Secondary | ICD-10-CM | POA: Diagnosis not present

## 2017-03-29 DIAGNOSIS — M542 Cervicalgia: Secondary | ICD-10-CM | POA: Diagnosis not present

## 2017-03-30 NOTE — Telephone Encounter (Signed)
Called pt, she has decided against adjuvant therapy. Informed her of CEA result, per MD note below. She voiced understanding of plan to repeat lab. Pt given 7/3 @ 2PM appt.

## 2017-03-30 NOTE — Addendum Note (Signed)
Addended by: Brien Few on: 03/30/2017 03:39 PM   Modules accepted: Orders

## 2017-03-31 DIAGNOSIS — K219 Gastro-esophageal reflux disease without esophagitis: Secondary | ICD-10-CM | POA: Diagnosis not present

## 2017-03-31 DIAGNOSIS — R32 Unspecified urinary incontinence: Secondary | ICD-10-CM | POA: Diagnosis not present

## 2017-03-31 DIAGNOSIS — K5790 Diverticulosis of intestine, part unspecified, without perforation or abscess without bleeding: Secondary | ICD-10-CM | POA: Diagnosis not present

## 2017-03-31 DIAGNOSIS — F329 Major depressive disorder, single episode, unspecified: Secondary | ICD-10-CM | POA: Diagnosis not present

## 2017-03-31 DIAGNOSIS — E43 Unspecified severe protein-calorie malnutrition: Secondary | ICD-10-CM | POA: Diagnosis not present

## 2017-03-31 DIAGNOSIS — M6281 Muscle weakness (generalized): Secondary | ICD-10-CM | POA: Diagnosis not present

## 2017-04-02 ENCOUNTER — Encounter: Payer: Self-pay | Admitting: *Deleted

## 2017-04-02 NOTE — Progress Notes (Signed)
[place] Work  Clinical Social Work was referred by nurse for assessment of psychosocial needs due to request for support.  Clinical Social Worker contacted patient at home to offer support and assess needs. CSW introduced self, explained role of CSW/Pt and Family Support Team, support groups and other resources to assist. Pt wants a day time support group and felt the 6-730pm GI group was too late in the day. CSW educated pt about additional support through individual counseling or remote support groups through [facility]. Pt agrees to consider. She reports she has decided against treatment and will reach out as needs arise.    Clinical Social Work interventions:  Resource education Loren Racer, [place], OSW-C Clinical Social Worker [place]  [place] Phone: [phone] Fax: [phone]

## 2017-04-06 ENCOUNTER — Other Ambulatory Visit (HOSPITAL_BASED_OUTPATIENT_CLINIC_OR_DEPARTMENT_OTHER): Payer: Medicare Other

## 2017-04-06 DIAGNOSIS — C187 Malignant neoplasm of sigmoid colon: Secondary | ICD-10-CM | POA: Diagnosis present

## 2017-04-06 DIAGNOSIS — M9902 Segmental and somatic dysfunction of thoracic region: Secondary | ICD-10-CM | POA: Diagnosis not present

## 2017-04-06 DIAGNOSIS — M9901 Segmental and somatic dysfunction of cervical region: Secondary | ICD-10-CM | POA: Diagnosis not present

## 2017-04-06 DIAGNOSIS — M546 Pain in thoracic spine: Secondary | ICD-10-CM | POA: Diagnosis not present

## 2017-04-06 DIAGNOSIS — M542 Cervicalgia: Secondary | ICD-10-CM | POA: Diagnosis not present

## 2017-04-06 DIAGNOSIS — C19 Malignant neoplasm of rectosigmoid junction: Secondary | ICD-10-CM

## 2017-04-06 LAB — CEA (IN HOUSE-CHCC): CEA (CHCC-In House): 19.14 ng/mL — ABNORMAL HIGH (ref 0.00–5.00)

## 2017-04-08 DIAGNOSIS — M542 Cervicalgia: Secondary | ICD-10-CM | POA: Diagnosis not present

## 2017-04-08 DIAGNOSIS — M9902 Segmental and somatic dysfunction of thoracic region: Secondary | ICD-10-CM | POA: Diagnosis not present

## 2017-04-08 DIAGNOSIS — M546 Pain in thoracic spine: Secondary | ICD-10-CM | POA: Diagnosis not present

## 2017-04-08 DIAGNOSIS — M9901 Segmental and somatic dysfunction of cervical region: Secondary | ICD-10-CM | POA: Diagnosis not present

## 2017-04-09 ENCOUNTER — Other Ambulatory Visit: Payer: Self-pay

## 2017-04-09 ENCOUNTER — Telehealth: Payer: Self-pay | Admitting: *Deleted

## 2017-04-09 DIAGNOSIS — K5790 Diverticulosis of intestine, part unspecified, without perforation or abscess without bleeding: Secondary | ICD-10-CM | POA: Diagnosis not present

## 2017-04-09 DIAGNOSIS — E43 Unspecified severe protein-calorie malnutrition: Secondary | ICD-10-CM | POA: Diagnosis not present

## 2017-04-09 DIAGNOSIS — R32 Unspecified urinary incontinence: Secondary | ICD-10-CM | POA: Diagnosis not present

## 2017-04-09 DIAGNOSIS — M6281 Muscle weakness (generalized): Secondary | ICD-10-CM | POA: Diagnosis not present

## 2017-04-09 DIAGNOSIS — F329 Major depressive disorder, single episode, unspecified: Secondary | ICD-10-CM | POA: Diagnosis not present

## 2017-04-09 DIAGNOSIS — R970 Elevated carcinoembryonic antigen [CEA]: Secondary | ICD-10-CM

## 2017-04-09 DIAGNOSIS — C19 Malignant neoplasm of rectosigmoid junction: Secondary | ICD-10-CM

## 2017-04-09 DIAGNOSIS — K219 Gastro-esophageal reflux disease without esophagitis: Secondary | ICD-10-CM | POA: Diagnosis not present

## 2017-04-09 DIAGNOSIS — C679 Malignant neoplasm of bladder, unspecified: Secondary | ICD-10-CM

## 2017-04-09 NOTE — Telephone Encounter (Signed)
TCT patient to advise of elevated CEA. Per Dr. Benay Spice, he wantes repeat labs next week and CT Scan C/A/P with contrast the following week. Follow up appt with Md will be 04/28/17 @ 2:30 pm.  Spoke with pt and relayed above information to her. She verbalized understanding and will wait for scheduler to call her back.

## 2017-04-09 NOTE — Progress Notes (Signed)
CT scheduled for 04/19/17.

## 2017-04-12 ENCOUNTER — Telehealth: Payer: Self-pay

## 2017-04-12 ENCOUNTER — Telehealth: Payer: Self-pay | Admitting: Oncology

## 2017-04-12 NOTE — Telephone Encounter (Signed)
Call placed to pt with no answer. Unable to leave a voicemail

## 2017-04-12 NOTE — Telephone Encounter (Signed)
sw pt to confirm upcoming appts per sch msg in July

## 2017-04-12 NOTE — Telephone Encounter (Signed)
Call placed to pt to confirm lab appt on 7/10 at 1145 and her CT appt on 7/16 at 0900. Pt verbalizes understanding. Pt also verbalizes understanding to pick up CT contrast at her lab appt on 7/10.

## 2017-04-12 NOTE — Telephone Encounter (Signed)
Call placed to pt with no answer. Unable to leave voicemail.

## 2017-04-13 ENCOUNTER — Other Ambulatory Visit (HOSPITAL_BASED_OUTPATIENT_CLINIC_OR_DEPARTMENT_OTHER): Payer: Medicare Other

## 2017-04-13 ENCOUNTER — Other Ambulatory Visit: Payer: Medicare Other

## 2017-04-13 DIAGNOSIS — C187 Malignant neoplasm of sigmoid colon: Secondary | ICD-10-CM

## 2017-04-13 DIAGNOSIS — C679 Malignant neoplasm of bladder, unspecified: Secondary | ICD-10-CM

## 2017-04-13 DIAGNOSIS — C19 Malignant neoplasm of rectosigmoid junction: Secondary | ICD-10-CM

## 2017-04-13 LAB — CBC WITH DIFFERENTIAL/PLATELET
BASO%: 0.7 % (ref 0.0–2.0)
Basophils Absolute: 0.1 10*3/uL (ref 0.0–0.1)
EOS ABS: 0.4 10*3/uL (ref 0.0–0.5)
EOS%: 4 % (ref 0.0–7.0)
HEMATOCRIT: 32.8 % — AB (ref 34.8–46.6)
HEMOGLOBIN: 10.7 g/dL — AB (ref 11.6–15.9)
LYMPH#: 1.5 10*3/uL (ref 0.9–3.3)
LYMPH%: 15 % (ref 14.0–49.7)
MCH: 26.5 pg (ref 25.1–34.0)
MCHC: 32.6 g/dL (ref 31.5–36.0)
MCV: 81.1 fL (ref 79.5–101.0)
MONO#: 0.7 10*3/uL (ref 0.1–0.9)
MONO%: 7.5 % (ref 0.0–14.0)
NEUT%: 72.8 % (ref 38.4–76.8)
NEUTROS ABS: 7.1 10*3/uL — AB (ref 1.5–6.5)
Platelets: 363 10*3/uL (ref 145–400)
RBC: 4.05 10*6/uL (ref 3.70–5.45)
RDW: 18.2 % — AB (ref 11.2–14.5)
WBC: 9.8 10*3/uL (ref 3.9–10.3)

## 2017-04-13 LAB — BASIC METABOLIC PANEL
Anion Gap: 9 mEq/L (ref 3–11)
BUN: 22.2 mg/dL (ref 7.0–26.0)
CHLORIDE: 101 meq/L (ref 98–109)
CO2: 23 meq/L (ref 22–29)
Calcium: 9.4 mg/dL (ref 8.4–10.4)
Creatinine: 1.3 mg/dL — ABNORMAL HIGH (ref 0.6–1.1)
EGFR: 37 mL/min/{1.73_m2} — AB (ref >90)
GLUCOSE: 77 mg/dL (ref 70–140)
Potassium: 4.6 mEq/L (ref 3.5–5.1)
SODIUM: 133 meq/L — AB (ref 136–145)

## 2017-04-13 LAB — CEA (IN HOUSE-CHCC): CEA (CHCC-In House): 22 ng/mL — ABNORMAL HIGH (ref 0.00–5.00)

## 2017-04-14 ENCOUNTER — Telehealth: Payer: Self-pay | Admitting: Oncology

## 2017-04-14 ENCOUNTER — Other Ambulatory Visit: Payer: Self-pay | Admitting: *Deleted

## 2017-04-14 ENCOUNTER — Telehealth: Payer: Self-pay | Admitting: *Deleted

## 2017-04-14 DIAGNOSIS — R7989 Other specified abnormal findings of blood chemistry: Secondary | ICD-10-CM

## 2017-04-14 NOTE — Telephone Encounter (Signed)
lvm to inform pt of lab appt prior to CT scan 7/16 at 0930 per sch msg

## 2017-04-14 NOTE — Telephone Encounter (Signed)
TCT patient to inform her that she will need repat lab work on Monday, 04/19/17 prior to her scheduled CT Scan. Scan is scheduled for 11:30 am, with arrival time @ 11:15. No answer, no vm available. LOS sent for lab appt at least 1 hour prior to scan.  Order for BMET placed.

## 2017-04-15 ENCOUNTER — Telehealth: Payer: Self-pay | Admitting: *Deleted

## 2017-04-15 NOTE — Telephone Encounter (Signed)
Message from pt: questions re: lab appt. Returned call, informed her lab is being repeated to be sure it is safe to take CT contrast. She voiced understanding.

## 2017-04-19 ENCOUNTER — Other Ambulatory Visit: Payer: Medicare Other

## 2017-04-19 ENCOUNTER — Encounter (HOSPITAL_COMMUNITY): Payer: Self-pay

## 2017-04-19 ENCOUNTER — Other Ambulatory Visit (HOSPITAL_BASED_OUTPATIENT_CLINIC_OR_DEPARTMENT_OTHER): Payer: Medicare Other

## 2017-04-19 ENCOUNTER — Ambulatory Visit (HOSPITAL_COMMUNITY): Payer: Medicare Other

## 2017-04-19 ENCOUNTER — Ambulatory Visit (HOSPITAL_COMMUNITY)
Admission: RE | Admit: 2017-04-19 | Discharge: 2017-04-19 | Disposition: A | Discharge status: Home / Self Care | Payer: Medicare Other | Source: Ambulatory Visit | Attending: Oncology | Admitting: Oncology

## 2017-04-19 DIAGNOSIS — C679 Malignant neoplasm of bladder, unspecified: Secondary | ICD-10-CM | POA: Diagnosis not present

## 2017-04-19 DIAGNOSIS — I313 Pericardial effusion (noninflammatory): Secondary | ICD-10-CM | POA: Insufficient documentation

## 2017-04-19 DIAGNOSIS — C787 Secondary malignant neoplasm of liver and intrahepatic bile duct: Secondary | ICD-10-CM | POA: Diagnosis not present

## 2017-04-19 DIAGNOSIS — I700 Atherosclerosis of aorta: Secondary | ICD-10-CM | POA: Insufficient documentation

## 2017-04-19 DIAGNOSIS — C189 Malignant neoplasm of colon, unspecified: Secondary | ICD-10-CM | POA: Diagnosis not present

## 2017-04-19 DIAGNOSIS — R7989 Other specified abnormal findings of blood chemistry: Secondary | ICD-10-CM

## 2017-04-19 DIAGNOSIS — N135 Crossing vessel and stricture of ureter without hydronephrosis: Secondary | ICD-10-CM | POA: Diagnosis not present

## 2017-04-19 DIAGNOSIS — R590 Localized enlarged lymph nodes: Secondary | ICD-10-CM | POA: Diagnosis not present

## 2017-04-19 DIAGNOSIS — J90 Pleural effusion, not elsewhere classified: Secondary | ICD-10-CM | POA: Diagnosis not present

## 2017-04-19 LAB — BASIC METABOLIC PANEL
ANION GAP: 10 meq/L (ref 3–11)
BUN: 19.9 mg/dL (ref 7.0–26.0)
CALCIUM: 9.7 mg/dL (ref 8.4–10.4)
CO2: 23 mEq/L (ref 22–29)
Chloride: 103 mEq/L (ref 98–109)
Creatinine: 1.4 mg/dL — ABNORMAL HIGH (ref 0.6–1.1)
EGFR: 33 mL/min/{1.73_m2} — AB (ref >90)
Glucose: 96 mg/dl (ref 70–140)
POTASSIUM: 4.5 meq/L (ref 3.5–5.1)
SODIUM: 136 meq/L (ref 136–145)

## 2017-04-19 MED ORDER — IOPAMIDOL (ISOVUE-300) INJECTION 61%
INTRAVENOUS | Status: AC
  Filled 2017-04-19: qty 100

## 2017-04-19 MED ORDER — IOPAMIDOL (ISOVUE-300) INJECTION 61%
100.0000 mL | Freq: Once | INTRAVENOUS | Status: AC | PRN
  Administered 2017-04-19: 80 mL via INTRAVENOUS

## 2017-04-20 ENCOUNTER — Other Ambulatory Visit: Payer: Self-pay | Admitting: *Deleted

## 2017-04-20 DIAGNOSIS — C19 Malignant neoplasm of rectosigmoid junction: Secondary | ICD-10-CM

## 2017-04-20 NOTE — Telephone Encounter (Signed)
Received note from operator, patient requests "Tramadol refill.  The tramadol makes me nervous and just takes the edge off.  May I take two pills?  I use [facility] representative on [employer] [employer]  Return number [phone]."  Last order on 01-20-2017 from [place] with [facility] for Tramadol 50 mg, take 1-2 tablets.  Quantity of 40 tablets.

## 2017-04-21 DIAGNOSIS — M542 Cervicalgia: Secondary | ICD-10-CM | POA: Diagnosis not present

## 2017-04-21 DIAGNOSIS — M9902 Segmental and somatic dysfunction of thoracic region: Secondary | ICD-10-CM | POA: Diagnosis not present

## 2017-04-21 DIAGNOSIS — M9901 Segmental and somatic dysfunction of cervical region: Secondary | ICD-10-CM | POA: Diagnosis not present

## 2017-04-21 DIAGNOSIS — M546 Pain in thoracic spine: Secondary | ICD-10-CM | POA: Diagnosis not present

## 2017-04-21 MED ORDER — HYDROCODONE-ACETAMINOPHEN 5-325 MG PO TABS: 1.0000 | ORAL_TABLET | Freq: Four times a day (QID) | ORAL | 0 refills | Status: DC | PRN

## 2017-04-21 MED ORDER — HYDROCODONE-ACETAMINOPHEN 5-325 MG PO TABS: 1.0000 | ORAL_TABLET | Freq: Four times a day (QID) | ORAL | 0 refills | Status: AC | PRN

## 2017-04-21 NOTE — Telephone Encounter (Signed)
Returned call to pt, she reports lower back and general abdominal pain. 8/10 before taking Tramadol, 6/10 after. Gets down to about 4/10 if she adds Tylenol. Pt avoids taking Tramadol at night because it makes her "jittery."  Reviewed with Ned Card, NP: Increasing Tramadol is not recommended with low creatinine clearance. Order received for Hydrocodone. Side effects reviewed. Pt instructed not to drive while taking Hydrocodone, call office day prior to needing refill.

## 2017-04-22 ENCOUNTER — Telehealth: Payer: Self-pay | Admitting: *Deleted

## 2017-04-22 DIAGNOSIS — C19 Malignant neoplasm of rectosigmoid junction: Secondary | ICD-10-CM

## 2017-04-22 NOTE — Telephone Encounter (Signed)
-----   Message from Ladell Pier, MD sent at 04/22/2017 12:46 PM EDT ----- Please call patient, cts show progressive cancer in pelvis and liver, left ureter is oabstructed, creatinine is elevated Please refer to urology to consider stent ASAP F/u as scheduled to discuss chemotherapy options

## 2017-04-22 NOTE — Telephone Encounter (Signed)
Spoke with pt: informed her of CT result, per MD note below. Referral to urology entered, requested they contact pt with appt. Provided pt with phone # for [facility].  Pt reports pain is better managed with Hydrocodone.

## 2017-04-22 NOTE — Telephone Encounter (Signed)
See my inbox note Has recurrent disease with ureter obstruction Needs urology consult

## 2017-04-25 ENCOUNTER — Emergency Department (HOSPITAL_COMMUNITY): Payer: Medicare Other

## 2017-04-25 ENCOUNTER — Inpatient Hospital Stay (HOSPITAL_COMMUNITY)
Admission: EM | Admit: 2017-04-25 | Discharge: 2017-05-08 | DRG: 330 | Disposition: A | Discharge status: Skilled Nursing Facility | Payer: Medicare Other | Attending: Internal Medicine | Admitting: Internal Medicine

## 2017-04-25 ENCOUNTER — Encounter (HOSPITAL_COMMUNITY): Payer: Self-pay | Admitting: Emergency Medicine

## 2017-04-25 DIAGNOSIS — J9811 Atelectasis: Secondary | ICD-10-CM | POA: Diagnosis not present

## 2017-04-25 DIAGNOSIS — N3001 Acute cystitis with hematuria: Secondary | ICD-10-CM | POA: Diagnosis not present

## 2017-04-25 DIAGNOSIS — C787 Secondary malignant neoplasm of liver and intrahepatic bile duct: Secondary | ICD-10-CM | POA: Diagnosis present

## 2017-04-25 DIAGNOSIS — Z86718 Personal history of other venous thrombosis and embolism: Secondary | ICD-10-CM

## 2017-04-25 DIAGNOSIS — C187 Malignant neoplasm of sigmoid colon: Principal | ICD-10-CM | POA: Diagnosis present

## 2017-04-25 DIAGNOSIS — E46 Unspecified protein-calorie malnutrition: Secondary | ICD-10-CM | POA: Diagnosis present

## 2017-04-25 DIAGNOSIS — D72829 Elevated white blood cell count, unspecified: Secondary | ICD-10-CM

## 2017-04-25 DIAGNOSIS — R109 Unspecified abdominal pain: Secondary | ICD-10-CM

## 2017-04-25 DIAGNOSIS — G893 Neoplasm related pain (acute) (chronic): Secondary | ICD-10-CM

## 2017-04-25 DIAGNOSIS — R310 Gross hematuria: Secondary | ICD-10-CM | POA: Diagnosis not present

## 2017-04-25 DIAGNOSIS — D649 Anemia, unspecified: Secondary | ICD-10-CM

## 2017-04-25 DIAGNOSIS — K567 Ileus, unspecified: Secondary | ICD-10-CM | POA: Diagnosis not present

## 2017-04-25 DIAGNOSIS — E785 Hyperlipidemia, unspecified: Secondary | ICD-10-CM | POA: Diagnosis present

## 2017-04-25 DIAGNOSIS — R1030 Lower abdominal pain, unspecified: Secondary | ICD-10-CM

## 2017-04-25 DIAGNOSIS — N390 Urinary tract infection, site not specified: Secondary | ICD-10-CM

## 2017-04-25 DIAGNOSIS — Z87891 Personal history of nicotine dependence: Secondary | ICD-10-CM

## 2017-04-25 DIAGNOSIS — Z801 Family history of malignant neoplasm of trachea, bronchus and lung: Secondary | ICD-10-CM

## 2017-04-25 DIAGNOSIS — C772 Secondary and unspecified malignant neoplasm of intra-abdominal lymph nodes: Secondary | ICD-10-CM | POA: Diagnosis present

## 2017-04-25 DIAGNOSIS — Z7189 Other specified counseling: Secondary | ICD-10-CM | POA: Diagnosis not present

## 2017-04-25 DIAGNOSIS — Z8542 Personal history of malignant neoplasm of other parts of uterus: Secondary | ICD-10-CM

## 2017-04-25 DIAGNOSIS — I10 Essential (primary) hypertension: Secondary | ICD-10-CM

## 2017-04-25 DIAGNOSIS — Z9049 Acquired absence of other specified parts of digestive tract: Secondary | ICD-10-CM

## 2017-04-25 DIAGNOSIS — D630 Anemia in neoplastic disease: Secondary | ICD-10-CM | POA: Diagnosis present

## 2017-04-25 DIAGNOSIS — Z7901 Long term (current) use of anticoagulants: Secondary | ICD-10-CM

## 2017-04-25 DIAGNOSIS — Z515 Encounter for palliative care: Secondary | ICD-10-CM | POA: Diagnosis not present

## 2017-04-25 DIAGNOSIS — N289 Disorder of kidney and ureter, unspecified: Secondary | ICD-10-CM

## 2017-04-25 DIAGNOSIS — Z66 Do not resuscitate: Secondary | ICD-10-CM | POA: Diagnosis present

## 2017-04-25 DIAGNOSIS — N136 Pyonephrosis: Secondary | ICD-10-CM | POA: Diagnosis not present

## 2017-04-25 DIAGNOSIS — K5900 Constipation, unspecified: Secondary | ICD-10-CM

## 2017-04-25 DIAGNOSIS — K56609 Unspecified intestinal obstruction, unspecified as to partial versus complete obstruction: Secondary | ICD-10-CM | POA: Diagnosis present

## 2017-04-25 DIAGNOSIS — R52 Pain, unspecified: Secondary | ICD-10-CM

## 2017-04-25 DIAGNOSIS — N179 Acute kidney failure, unspecified: Secondary | ICD-10-CM | POA: Diagnosis present

## 2017-04-25 DIAGNOSIS — R1900 Intra-abdominal and pelvic swelling, mass and lump, unspecified site: Secondary | ICD-10-CM

## 2017-04-25 DIAGNOSIS — Z6821 Body mass index (BMI) 21.0-21.9, adult: Secondary | ICD-10-CM

## 2017-04-25 DIAGNOSIS — K219 Gastro-esophageal reflux disease without esophagitis: Secondary | ICD-10-CM | POA: Diagnosis present

## 2017-04-25 DIAGNOSIS — R1084 Generalized abdominal pain: Secondary | ICD-10-CM

## 2017-04-25 DIAGNOSIS — R102 Pelvic and perineal pain: Secondary | ICD-10-CM | POA: Diagnosis not present

## 2017-04-25 DIAGNOSIS — I82409 Acute embolism and thrombosis of unspecified deep veins of unspecified lower extremity: Secondary | ICD-10-CM

## 2017-04-25 DIAGNOSIS — Z90710 Acquired absence of both cervix and uterus: Secondary | ICD-10-CM

## 2017-04-25 DIAGNOSIS — R8281 Pyuria: Secondary | ICD-10-CM

## 2017-04-25 DIAGNOSIS — N1330 Unspecified hydronephrosis: Secondary | ICD-10-CM

## 2017-04-25 DIAGNOSIS — E871 Hypo-osmolality and hyponatremia: Secondary | ICD-10-CM | POA: Diagnosis not present

## 2017-04-25 HISTORY — DX: Malignant neoplasm of colon, unspecified: C78.7

## 2017-04-25 HISTORY — DX: Disorder of kidney and ureter, unspecified: N28.9

## 2017-04-25 HISTORY — DX: Malignant neoplasm of colon, unspecified: C18.9

## 2017-04-25 HISTORY — DX: Essential (primary) hypertension: I10

## 2017-04-25 LAB — COMPREHENSIVE METABOLIC PANEL
ALBUMIN: 3.7 g/dL (ref 3.5–5.0)
ALK PHOS: 57 U/L (ref 38–126)
ALT: 15 U/L (ref 14–54)
AST: 35 U/L (ref 15–41)
Anion gap: 9 (ref 5–15)
BUN: 17 mg/dL (ref 6–20)
CALCIUM: 9.2 mg/dL (ref 8.9–10.3)
CHLORIDE: 100 mmol/L — AB (ref 101–111)
CO2: 26 mmol/L (ref 22–32)
CREATININE: 1.46 mg/dL — AB (ref 0.44–1.00)
GFR calc Af Amer: 36 mL/min — ABNORMAL LOW (ref >60)
GFR calc non Af Amer: 31 mL/min — ABNORMAL LOW (ref >60)
GLUCOSE: 86 mg/dL (ref 65–99)
Potassium: 4.8 mmol/L (ref 3.5–5.1)
SODIUM: 135 mmol/L (ref 135–145)
Total Bilirubin: 1.2 mg/dL (ref 0.3–1.2)
Total Protein: 7.5 g/dL (ref 6.5–8.1)

## 2017-04-25 LAB — URINALYSIS, ROUTINE W REFLEX MICROSCOPIC
Bilirubin Urine: NEGATIVE
GLUCOSE, UA: NEGATIVE mg/dL
KETONES UR: 20 mg/dL — AB
Nitrite: NEGATIVE
PROTEIN: 100 mg/dL — AB
Specific Gravity, Urine: 1.02 (ref 1.005–1.030)
pH: 5 (ref 5.0–8.0)

## 2017-04-25 LAB — CBC
HCT: 34.2 % — ABNORMAL LOW (ref 36.0–46.0)
Hemoglobin: 11.3 g/dL — ABNORMAL LOW (ref 12.0–15.0)
MCH: 26.7 pg (ref 26.0–34.0)
MCHC: 33 g/dL (ref 30.0–36.0)
MCV: 80.9 fL (ref 78.0–100.0)
PLATELETS: 395 10*3/uL (ref 150–400)
RBC: 4.23 MIL/uL (ref 3.87–5.11)
RDW: 17.4 % — ABNORMAL HIGH (ref 11.5–15.5)
WBC: 11.2 10*3/uL — ABNORMAL HIGH (ref 4.0–10.5)

## 2017-04-25 LAB — LIPASE, BLOOD: Lipase: 24 U/L (ref 11–51)

## 2017-04-25 MED ORDER — ONDANSETRON HCL 4 MG PO TABS: 4.0000 mg | ORAL_TABLET | Freq: Four times a day (QID) | ORAL | Status: DC | PRN

## 2017-04-25 MED ORDER — HYPROMELLOSE (GONIOSCOPIC) 2.5 % OP SOLN: 1.0000 [drp] | Freq: Every day | OPHTHALMIC | Status: DC | PRN

## 2017-04-25 MED ORDER — SODIUM CHLORIDE 0.9 % IV SOLN
INTRAVENOUS | Status: DC
  Administered 2017-04-25 – 2017-04-26 (×2): via INTRAVENOUS

## 2017-04-25 MED ORDER — DEXTROSE 5 % IV SOLN
1.0000 g | INTRAVENOUS | Status: DC
  Administered 2017-04-25 – 2017-04-26 (×2): 1 g via INTRAVENOUS
  Filled 2017-04-25 (×2): qty 10

## 2017-04-25 MED ORDER — OCUVITE-LUTEIN PO CAPS
1.0000 | ORAL_CAPSULE | Freq: Every day | ORAL | Status: DC
  Administered 2017-04-26 – 2017-05-04 (×8): 1 via ORAL
  Filled 2017-04-25 (×10): qty 1

## 2017-04-25 MED ORDER — FLEET ENEMA 7-19 GM/118ML RE ENEM
1.0000 | ENEMA | Freq: Every day | RECTAL | Status: DC | PRN
  Administered 2017-05-05: 1 via RECTAL
  Filled 2017-04-25: qty 1

## 2017-04-25 MED ORDER — APIXABAN 2.5 MG PO TABS
2.5000 mg | ORAL_TABLET | Freq: Two times a day (BID) | ORAL | Status: DC
  Administered 2017-04-25 – 2017-04-26 (×2): 2.5 mg via ORAL
  Filled 2017-04-25 (×3): qty 1

## 2017-04-25 MED ORDER — MORPHINE SULFATE (PF) 4 MG/ML IV SOLN
1.0000 mg | INTRAVENOUS | Status: DC | PRN
  Administered 2017-04-25 – 2017-04-28 (×5): 1 mg via INTRAVENOUS
  Filled 2017-04-25 (×5): qty 1

## 2017-04-25 MED ORDER — HYDROCODONE-ACETAMINOPHEN 5-325 MG PO TABS: 1.0000 | ORAL_TABLET | Freq: Four times a day (QID) | ORAL | Status: DC | PRN

## 2017-04-25 MED ORDER — POLYETHYLENE GLYCOL 3350 17 G PO PACK: 17.0000 g | PACK | Freq: Every day | ORAL | Status: DC | PRN

## 2017-04-25 MED ORDER — OXYCODONE HCL 5 MG PO TABS
5.0000 mg | ORAL_TABLET | Freq: Once | ORAL | Status: AC
  Administered 2017-04-25: 5 mg via ORAL
  Filled 2017-04-25: qty 1

## 2017-04-25 MED ORDER — SODIUM CHLORIDE 0.9 % IV BOLUS (SEPSIS)
500.0000 mL | Freq: Once | INTRAVENOUS | Status: AC
  Administered 2017-04-25: 500 mL via INTRAVENOUS

## 2017-04-25 MED ORDER — ACETAMINOPHEN 650 MG RE SUPP: 650.0000 mg | Freq: Four times a day (QID) | RECTAL | Status: DC | PRN

## 2017-04-25 MED ORDER — HYDRALAZINE HCL 20 MG/ML IJ SOLN
10.0000 mg | INTRAMUSCULAR | Status: DC | PRN
  Administered 2017-04-27 – 2017-05-08 (×7): 10 mg via INTRAVENOUS
  Filled 2017-04-25 (×8): qty 0.5

## 2017-04-25 MED ORDER — ONDANSETRON HCL 4 MG/2ML IJ SOLN
4.0000 mg | Freq: Four times a day (QID) | INTRAMUSCULAR | Status: DC | PRN
  Administered 2017-04-26 – 2017-05-03 (×7): 4 mg via INTRAVENOUS
  Filled 2017-04-25 (×7): qty 2

## 2017-04-25 MED ORDER — ACETAMINOPHEN 325 MG PO TABS
650.0000 mg | ORAL_TABLET | Freq: Four times a day (QID) | ORAL | Status: DC | PRN
  Filled 2017-04-25: qty 2

## 2017-04-25 MED ORDER — FAMOTIDINE 10 MG PO TABS: 10.0000 mg | ORAL_TABLET | Freq: Every evening | ORAL | Status: DC | PRN

## 2017-04-25 MED ORDER — DOCUSATE SODIUM 100 MG PO CAPS
100.0000 mg | ORAL_CAPSULE | Freq: Every day | ORAL | Status: DC | PRN
  Administered 2017-04-25: 100 mg via ORAL
  Filled 2017-04-25: qty 1

## 2017-04-25 MED ORDER — IOPAMIDOL (ISOVUE-300) INJECTION 61%
INTRAVENOUS | Status: AC
  Filled 2017-04-25: qty 30

## 2017-04-25 MED ORDER — SENNOSIDES-DOCUSATE SODIUM 8.6-50 MG PO TABS
2.0000 | ORAL_TABLET | Freq: Every day | ORAL | Status: DC
  Administered 2017-04-25 – 2017-05-08 (×13): 2 via ORAL
  Filled 2017-04-25 (×14): qty 2

## 2017-04-25 MED ORDER — OXYCODONE HCL 5 MG PO TABS
5.0000 mg | ORAL_TABLET | Freq: Four times a day (QID) | ORAL | Status: DC | PRN
  Administered 2017-04-26 (×2): 5 mg via ORAL
  Filled 2017-04-25 (×2): qty 1

## 2017-04-25 NOTE — Progress Notes (Signed)
ANTICOAGULATION CONSULT NOTE - Initial Consult  Pharmacy Consult for Eliquis Indication: DVT  No Known Allergies  Patient Measurements:   Weight 61kg in May 2018  Vital Signs: Temp: 97.7 F (36.5 C) (07/22 2027) Temp Source: Oral (07/22 2027) BP: 187/99 (07/22 2027) Pulse Rate: 65 (07/22 2027)  Labs:  Recent Labs  04/25/17 1504  HGB 11.3*  HCT 34.2*  PLT 395  CREATININE 1.46*    CrCl cannot be calculated (Unknown ideal weight.).   Medical History: Past Medical History:  Diagnosis Date  . Acute renal failure ([place])   . Allergy    seasonal  . BPPV (benign paroxysmal positional vertigo) 2012  . Colon cancer metastasized to liver ([place])   . Depression   . Diverticulitis   . Dyslipidemia   . GERD (gastroesophageal reflux disease)   . Hemorrhoids   . Hypertension 04/25/2017  . Macular degeneration   . Renal insufficiency 04/25/2017  . Urine frequency   . Uterine carcinoma ([facility])     Medications:  Scheduled:  . iopamidol       Infusions:  . cefTRIAXone (ROCEPHIN)  IV     PRN:   Assessment: 83 yo female with metastatic colon cancer admitted with large bowel obstruction. Patient with a history of DVT on chronic Eliquis.  Pharmacy consulted to continue dosing while inpatient.  Goal of Therapy:  Therapeutic anticoagulation Monitor platelets by anticoagulation protocol: Yes   Plan:   Continue Eliquis 2.5mg  PO bid per home regimen  Monitor for signs/symptoms of bleeding or thrombosis  Peggyann Juba, PharmD, BCPS Pager: [phone] 04/25/2017,9:14 PM

## 2017-04-25 NOTE — Progress Notes (Signed)
Pt. BP upon admission 192/66. Pt. Also c/o of pain and states that pain medicine norco/vicodin is not effective. Pt. Was given 5 mg of oxycodone IR in the ED, asking if pain medicine can be changed. On call MD [facility] paged and made aware. Will continue to monitor and carry out any new orders.

## 2017-04-25 NOTE — Consult Note (Signed)
Urology Consult Note    Requesting Attending Physician:  Jani Gravel, MD Service Requesting Consult:  Emergency Department Service Providing Consult: Urology  Consulting Attending: Dr. Nicolette Bang   Assessment:  Patient is a 83 y.o. female with metastatic colon cancer with extensive pelvic disease causing left ureteral obstruction, DVT on eliquis,. Patient presented with fatigue, severe constipation and abdominal pain and is clear she is only interested in interventions which will improve her quality of life.   No indication for enmergent intervention regarding left ureteral obstruction. Patients Cr today is 1.4, stable over the last several weeks but increased from baseline of 0.63 in May. UA with minimal signs of infection, with negative nitrites, trace leukocytes and few bacteria on UA. Urine culture pending. Patient with mild leukocytosis, however this could be related to her metastatic disease. She has been afebrile with VSS.    At this time, patient does not seem to be having flank pain originating from her hydronephrosis. Intervention would be required if patient were to have urinary tract infection, be symptomatic, or if her Cr were preventing her from receiving additional therapies. Since none of these are occurring and patient prefers to pursue palliative interventions, recommend managing conservatively.   In the event that patient would need decompression of her left collecting system, recommend percutaneous nephrostomy tube over ureteral stent. I am concerned about ureteral stent placement given high liklihood of failure due to nature of extrinsic compression of ureter, in addition to recent history of tumor involvement with the bladder via a colovesical fistula. This will likely make identification of the ureteral orifice quite difficult, and perhaps impossible.     Recommendations:  1. Follow up urine culture, treat accordingly.  2. If patient were to become febrile, or  hemodynamically unstable please let urology know  3. Should patient desire chemotherapeutic intervention requiring improved renal function, recommend placement of left percutaneous nephrostomy tube by VIR.  4. Otherwise, follow up urology PRN   Thank you for this consult. Please contact the urology consult pager with any further questions/concerns.  Jonna Clark, MD Urology Surgical Resident  -----  Reason for Consult:  Left hydronephrosis   Charlene [name] is seen in consultation for reasons noted above at the request of Jani Gravel, MD on the Emergency Deparmtent service.  This is a 83 y.o. yo patient with a history of metastatic colon cancer. This was diagnosed in April 2018, when she was taken to the operating room for open sigmoidectomy due to an impassible stricture noted on sigmoidoscopy. A colovesical fistula was noted with significant pelvic inflammation intraoperatively. The sigmoid colon was adherent to the left pelvic sidewall and bladder with a large inflammatory mass anteriorly. The bladder was closed with sutures. Resection showed stage III colon cancer. Post operative course was complicated by a DVT requiring anticoagulation therapy and pelvic abscess. Patient decided against pursuing adjuvant chemotherapy.   She underwent repeat staging imaging on 04/22/17 which demonstrated progressive disease, and with new left hydorureteronephrosis to the level of pelvic mass. Pelvic mass noted to be crossing midline to right lower pelvic area. Cr was also noted to be elevated. She was referred to urology for consideration of ureteral stent.   She did not yet make her urology appointment. She presents to the ED today with abdominal pain and severe constipation. She is clear on her goal that she does not want advanced treatment and prefers to make comfort a priority.     Past Medical History: Past Medical History:  Diagnosis Date  .  Acute renal failure ([place])   . Allergy    seasonal  .  BPPV (benign paroxysmal positional vertigo) 2012  . Colon cancer metastasized to liver ([place])   . Depression   . Diverticulitis   . Dyslipidemia   . GERD (gastroesophageal reflux disease)   . Hemorrhoids   . Hypertension 04/25/2017  . Macular degeneration   . Renal insufficiency 04/25/2017  . Urine frequency   . Uterine carcinoma [facility])     Past Surgical History:  Past Surgical History:  Procedure Laterality Date  . ABDOMINAL HYSTERECTOMY    . COLECTOMY N/A 01/14/2017   Procedure: OPEN SIGMOIDECTOMY WITH REPAIR OF COLOVESICULAR FISTULA;  Surgeon: Leighton Ruff, MD;  Location: [facility] ORS;  Service: General;  Laterality: N/A;  . EYE SURGERY     cataract removal bilateral 2012  . FLEXIBLE SIGMOIDOSCOPY N/A 01/11/2017   Procedure: FLEXIBLE SIGMOIDOSCOPY;  Surgeon: Leighton Ruff, MD;  Location: [facility] END[facility]COPY;  Service: Endoscopy;  Laterality: N/A;  . IR RADIOLOGIST EVAL & MGMT  02/11/2017  . TONSILLECTOMY      Medication: Current Facility-Administered Medications  Medication Dose Route Frequency Provider Last Rate Last Dose  . iopamidol (ISOVUE-300) 61 % injection            Current Outpatient Prescriptions  Medication Sig Dispense Refill  . apixaban (ELIQUIS) 2.5 MG TABS tablet Take 2.5 mg by mouth 2 (two) times daily.    . Carboxymethylcellul-Glycerin (LUBRICATING EYE DROPS OP) Apply 1 drop to eye daily as needed (dry eyes).    Marland Kitchen docusate sodium (COLACE) 100 MG capsule Take 100 mg by mouth daily as needed for mild constipation.    . famotidine (PEPCID AC) 10 MG chewable tablet Chew 2 tablets (20 mg total) by mouth at bedtime as needed for heartburn. (Patient taking differently: Chew 10 mg by mouth at bedtime as needed for heartburn. )    . HYDROcodone-acetaminophen (NORCO/VICODIN) 5-325 MG tablet Take 1 tablet by mouth every 6 (six) hours as needed for moderate pain. 40 tablet 0  . Multiple Vitamins-Minerals (MULTIVITAMIN ADULT PO) Take 1 tablet by mouth daily.     . Multiple  Vitamins-Minerals (PRESERVISION AREDS 2) CAPS Take 1 capsule by mouth daily.    . ondansetron (ZOFRAN) 4 MG tablet Take 4 mg by mouth every 6 (six) hours as needed for nausea or vomiting.    . sennosides-docusate sodium (SENOKOT-S) 8.6-50 MG tablet Take 2 tablets by mouth daily.    . protein supplement (RESOURCE BENEPROTEIN) 6 g POWD You can use whatever protein supplement that taste good to you.  Try to get 2-3 cans per day.  Do this till your ready to go home and take care of yourself. (Patient not taking: Reported on 02/11/2017)    . traMADol (ULTRAM) 50 MG tablet Take 1-2 tablets (50-100 mg total) by mouth every 6 (six) hours as needed for moderate pain or severe pain. (Patient not taking: Reported on 03/22/2017) 40 tablet 0    Allergies: No Known Allergies  Social History: Social History  Substance Use Topics  . Smoking status: Former Smoker    Quit date: 01/19/1973  . Smokeless tobacco: Never Used  . Alcohol use Yes     Comment: occ    Family History Family History  Problem Relation Age of Onset  . Heart disease Mother   . Diabetes Mother   . Stroke Father   . Diabetes Father   . Cancer Brother        lung cancer  . Heart  disease Brother     Review of Systems 10 systems were reviewed and are negative except as noted specifically in the HPI.  Objective   Vital signs in last 24 hours: BP (!) 187/99 (BP Location: Left Arm)   Pulse 65   Temp 97.7 F (36.5 C) (Oral)   Resp 18   SpO2 95%   Intake/Output last 3 shifts: No intake/output data recorded.  Physical Exam General: NAD, A&O, resting, appropriate HEENT: [place]/AT, EOMI, MMM Pulmonary: Normal work of breathing on room air Cardiovascular: HDS, adequate peripheral perfusion Abdomen: soft, NTTP, nondistended, no suprapubic fullness or tenderness GU: no CVA tenderness Extremities: warm and well perfused, no edema Neuro: Appropriate, no focal neurological deficits  Most Recent Labs: Lab Results  Component Value  Date   WBC 11.2 (H) 04/25/2017   HGB 11.3 (L) 04/25/2017   HCT 34.2 (L) 04/25/2017   PLT 395 04/25/2017    Lab Results  Component Value Date   NA 135 04/25/2017   K 4.8 04/25/2017   CL 100 (L) 04/25/2017   CO2 26 04/25/2017   BUN 17 04/25/2017   CREATININE 1.46 (H) 04/25/2017   CALCIUM 9.2 04/25/2017   MG 1.8 02/05/2017   PHOS 2.5 02/05/2017    Lab Results  Component Value Date   ALKPHOS 57 04/25/2017   BILITOT 1.2 04/25/2017   BILIDIR <0.1 11/06/2011   PROT 7.5 04/25/2017   ALBUMIN 3.7 04/25/2017   ALT 15 04/25/2017   AST 35 04/25/2017    Lab Results  Component Value Date   INR 1.38 02/03/2017   APTT 38 (H) 02/03/2017     Urine Culture: @[id number] (laburin,org,[id number])@    IMAGING: Ct Abdomen Pelvis Wo Contrast  Result Date: 04/25/2017 CLINICAL DATA:  Abdominal cramping over the last week. Emesis. Recent colon resection. EXAM: CT ABDOMEN AND PELVIS WITHOUT CONTRAST TECHNIQUE: Multidetector CT imaging of the abdomen and pelvis was performed following the standard protocol without IV contrast. COMPARISON:  04/19/2017 FINDINGS: Lower chest: No acute finding.  Hiatal hernia. Hepatobiliary: Small metastatic lesions within the liver demonstrated on the previous contrast-enhanced study cannot be visualized on today's noncontrast exam. These would presumably be unchanged. Small gallstones again visible dependent within the gallbladder. Small stone in the distal common bile duct. This could be symptomatic. Pancreas: Negative Spleen: Negative Adrenals/Urinary Tract: Adrenal glands remain normal. Right kidney contains of small cyst in the lower pole. Small nonobstructing stone in the lower pole. No acute renal finding on the right. Left kidney again shows an upper pole cyst in shows hydroureteronephrosis with the ureter being dilated all the way to the pelvis. There is re- roll obstruction in the pelvis due to recurrent tumor. No primary bladder lesion is seen. Stomach/Bowel:  There is recurrent tumor at the sigmoid colon anastomosis site which is locally extensive. This causes some degree of obstruction as well with relative constipation proximal to that. Vascular/Lymphatic: Aortic atherosclerosis is again demonstrated. Major veins are patent. Left-sided retroperitoneal adenopathy with low-density/necrosis as seen previously. Index node image 42 again measures 2.2 x 2.0 cm. Reproductive: No primary finding. Other: No free air. Musculoskeletal: Chronic spinal degenerative changes. IMPRESSION: Chololithiasis. Small stone also in the common bile duct. This could possibly be symptomatic. Small liver metastases previously demonstrated cannot be seen without contrast. No change would be expected over the last 6 days. Persistent left hydronephrosis due to ureteral obstruction in the left pelvic retroperitoneum where there is extensive recurrent tumor at the sigmoid anastomosis with local extension. Some relative large bowel obstruction  with a large amount of fecal matter proximal to this region. Metastatic retroperitoneal lymphadenopathy unchanged since the study of 6 days ago. Electronically Signed   By: Nelson Chimes M.D.   On: 04/25/2017 19:31

## 2017-04-25 NOTE — ED Provider Notes (Signed)
[facility] DEPT Provider Note   CSN: [id number] Arrival date & time: 04/25/17  1211   History   Chief Complaint Chief Complaint  Patient presents with  . Constipation  . Suicidal    HPI Charlene [name] is a 83 y.o. female.  HPI   83 year old female with history of recurrent diverticulitis for the past 3-4 years, metastatic colon cancer presents today with complaints of abdominal pain.  She is currently under the care of Dr. Benay Spice of oncology and most recently seen on 03/22/2017.  She subsequently had a CT scan done on 06-30-17 showing new metastatic hepatic lesions recurrent tumor in the pelvis which is obstructing the left ureter causing significant narrowing of the sigmoid colon.  Also noting infiltrating tumor crossing the midline involving the right lower pelvic small bowel loops.  Patient was informed of the results and has a close follow-up with urology tomorrow for likely stent placement.  Patient notes that she has not had a bowel movement in over a week.  She notes that she has very little appetite, has pain in her lower pelvis that is made worse with leaning forward.  Patient reports that she does not want any advanced treatment as she reports she is 83 years old and has lived a full life.  She would like to stay comfortable.  Additionally notes pain in her left flank.  She denies any dysuria, fever, nausea or vomiting.    Past Medical History:  Diagnosis Date  . Acute renal failure ([place])   . Allergy    seasonal  . BPPV (benign paroxysmal positional vertigo) 2012  . Colon cancer metastasized to liver ([place])   . Depression   . Diverticulitis   . Dyslipidemia   . GERD (gastroesophageal reflux disease)   . Hemorrhoids   . Hypertension 04/25/2017  . Macular degeneration   . Renal insufficiency 04/25/2017  . Urine frequency   . Uterine carcinoma [facility])     Patient Active Problem List   Diagnosis Date Noted  . Abdominal pain 04/25/2017  . UTI (urinary tract  infection) 04/25/2017  . Renal insufficiency 04/25/2017  . Anemia 04/25/2017  . Hypertension 04/25/2017  . Pelvic abscess in female 02/02/2017  . Colovesical fistula with cancer s/p colectomy/repair 01/14/2017 01/18/2017  . 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 Protein-calorie malnutrition, severe 02/01/2016  . Depression 01/31/2016  . GERD (gastroesophageal reflux disease) 01/31/2016  . Hemorrhoids 01/31/2016  . Acute diverticulitis 05/05/2015  . Abnormal LFTs 05/05/2015  . Dyslipidemia 03/13/2015  . Leukocytosis 03/13/2015  . Acute renal failure ([place]) 03/13/2015    Past Surgical History:  Procedure Laterality Date  . ABDOMINAL HYSTERECTOMY    . COLECTOMY N/A 01/14/2017   Procedure: OPEN SIGMOIDECTOMY WITH REPAIR OF COLOVESICULAR FISTULA;  Surgeon: Leighton Ruff, MD;  Location: [facility];  Service: General;  Laterality: N/A;  . EYE SURGERY     cataract removal bilateral 2012  . FLEXIBLE SIGMOIDOSCOPY N/A 01/11/2017   Procedure: FLEXIBLE SIGMOIDOSCOPY;  Surgeon: Leighton Ruff, MD;  Location: [facility];  Service: Endoscopy;  Laterality: N/A;  . IR RADIOLOGIST EVAL & MGMT  02/11/2017  . TONSILLECTOMY      OB History    No data available       Home Medications    Prior to Admission medications   Medication Sig Start Date End Date Taking? Authorizing Provider  apixaban (ELIQUIS) 2.5 MG TABS tablet Take 2.5 mg by mouth 2 (two) times daily.   Yes [provider]  Carboxymethylcellul-Glycerin (LUBRICATING EYE DROPS OP) Apply 1 drop to eye daily as needed (dry eyes).   Yes [provider]  docusate sodium (COLACE) 100 MG capsule Take 100 mg by mouth daily as needed for mild constipation.   Yes [provider]  famotidine (PEPCID AC) 10 MG chewable tablet Chew 2 tablets (20 mg total) by mouth at bedtime as needed for heartburn. Patient taking differently: Chew 10 mg by mouth at bedtime as needed for heartburn.   01/20/17  Yes Earnstine Regal, PA-C  HYDROcodone-acetaminophen (NORCO/VICODIN) 5-325 MG tablet Take 1 tablet by mouth every 6 (six) hours as needed for moderate pain. 04/21/17  Yes Owens Shark, NP  Multiple Vitamins-Minerals (MULTIVITAMIN ADULT PO) Take 1 tablet by mouth daily.    Yes [provider]  Multiple Vitamins-Minerals (PRESERVISION AREDS 2) CAPS Take 1 capsule by mouth daily.   Yes [provider]  ondansetron (ZOFRAN) 4 MG tablet Take 4 mg by mouth every 6 (six) hours as needed for nausea or vomiting.   Yes [provider]  sennosides-docusate sodium (SENOKOT-S) 8.6-50 MG tablet Take 2 tablets by mouth daily.   Yes [provider]  protein supplement (RESOURCE BENEPROTEIN) 6 g POWD You can use whatever protein supplement that taste good to you.  Try to get 2-3 cans per day.  Do this till your ready to go home and take care of yourself. Patient not taking: Reported on 02/11/2017 01/20/17   Earnstine Regal, PA-C  traMADol (ULTRAM) 50 MG tablet Take 1-2 tablets (50-100 mg total) by mouth every 6 (six) hours as needed for moderate pain or severe pain. Patient not taking: Reported on 03/22/2017 01/20/17   Earnstine Regal, PA-C    Family History Family History  Problem Relation Age of Onset  . Heart disease Mother   . Diabetes Mother   . Stroke Father   . Diabetes Father   . Cancer Brother        lung cancer  . Heart disease Brother     Social History Social History  Substance Use Topics  . Smoking status: Former Smoker    Quit date: 01/19/1973  . Smokeless tobacco: Never Used  . Alcohol use Yes     Comment: occ     Allergies   Patient has no known allergies.   Review of Systems Review of Systems  All other systems reviewed and are negative.   Physical Exam Updated Vital Signs BP (!) 192/66 (BP Location: Left Arm)   Pulse 60   Temp 98.6 F (37 C) (Oral)   Resp 17   Ht 5\' 4"  (1.626 m)   Wt 57.1 kg (125 lb 14.1 oz)   SpO2  95%   BMI 21.61 kg/m   Physical Exam  Constitutional: She is oriented to person, place, and time. She appears well-developed and well-nourished.  HENT:  Head: Normocephalic and atraumatic.  Eyes: Pupils are equal, round, and reactive to light. Conjunctivae are normal. Right eye exhibits no discharge. Left eye exhibits no discharge. No scleral icterus.  Neck: Normal range of motion. No JVD present. No tracheal deviation present.  Pulmonary/Chest: Effort normal. No stridor.  Abdominal: Soft. She exhibits no distension and no mass. There is tenderness. There is no rebound and no guarding. No hernia.  TTP of pelvic region   Neurological: She is alert and oriented to person, place, and time. Coordination normal.  Psychiatric: She has a normal mood and affect. Her behavior is normal. Judgment and thought  content normal.  Nursing note and vitals reviewed.    ED Treatments / Results  Labs (all labs ordered are listed, but only abnormal results are displayed) Labs Reviewed  COMPREHENSIVE METABOLIC PANEL - Abnormal; Notable for the following:       Result Value   Chloride 100 (*)    Creatinine, Ser 1.46 (*)    GFR calc non Af Amer 31 (*)    GFR calc Af Amer 36 (*)    All other components within normal limits  CBC - Abnormal; Notable for the following:    WBC 11.2 (*)    Hemoglobin 11.3 (*)    HCT 34.2 (*)    RDW 17.4 (*)    All other components within normal limits  URINALYSIS, ROUTINE W REFLEX MICROSCOPIC - Abnormal; Notable for the following:    APPearance HAZY (*)    Hgb urine dipstick MODERATE (*)    Ketones, ur 20 (*)    Protein, ur 100 (*)    Leukocytes, UA TRACE (*)    Bacteria, UA FEW (*)    Squamous Epithelial / LPF 0-5 (*)    All other components within normal limits  URINE CULTURE  LIPASE, BLOOD  PROTIME-INR  APTT  COMPREHENSIVE METABOLIC PANEL  CBC    EKG  EKG Interpretation None       Radiology Ct Abdomen Pelvis Wo Contrast  Result Date:  04/25/2017 CLINICAL DATA:  Abdominal cramping over the last week. Emesis. Recent colon resection. EXAM: CT ABDOMEN AND PELVIS WITHOUT CONTRAST TECHNIQUE: Multidetector CT imaging of the abdomen and pelvis was performed following the standard protocol without IV contrast. COMPARISON:  04/19/2017 FINDINGS: Lower chest: No acute finding.  Hiatal hernia. Hepatobiliary: Small metastatic lesions within the liver demonstrated on the previous contrast-enhanced study cannot be visualized on today's noncontrast exam. These would presumably be unchanged. Small gallstones again visible dependent within the gallbladder. Small stone in the distal common bile duct. This could be symptomatic. Pancreas: Negative Spleen: Negative Adrenals/Urinary Tract: Adrenal glands remain normal. Right kidney contains of small cyst in the lower pole. Small nonobstructing stone in the lower pole. No acute renal finding on the right. Left kidney again shows an upper pole cyst in shows hydroureteronephrosis with the ureter being dilated all the way to the pelvis. There is re- roll obstruction in the pelvis due to recurrent tumor. No primary bladder lesion is seen. Stomach/Bowel: There is recurrent tumor at the sigmoid colon anastomosis site which is locally extensive. This causes some degree of obstruction as well with relative constipation proximal to that. Vascular/Lymphatic: Aortic atherosclerosis is again demonstrated. Major veins are patent. Left-sided retroperitoneal adenopathy with low-density/necrosis as seen previously. Index node image 42 again measures 2.2 x 2.0 cm. Reproductive: No primary finding. Other: No free air. Musculoskeletal: Chronic spinal degenerative changes. IMPRESSION: Chololithiasis. Small stone also in the common bile duct. This could possibly be symptomatic. Small liver metastases previously demonstrated cannot be seen without contrast. No change would be expected over the last 6 days. Persistent left hydronephrosis due  to ureteral obstruction in the left pelvic retroperitoneum where there is extensive recurrent tumor at the sigmoid anastomosis with local extension. Some relative large bowel obstruction with a large amount of fecal matter proximal to this region. Metastatic retroperitoneal lymphadenopathy unchanged since the study of 6 days ago. Electronically Signed   By: Nelson Chimes M.D.   On: 04/25/2017 19:31    Procedures Procedures (including critical care time)  Medications Ordered in ED Medications  iopamidol (  ISOVUE-300) 61 % injection (not administered)  docusate sodium (COLACE) capsule 100 mg (not administered)  senna-docusate (Senokot-S) tablet 2 tablet (not administered)  ondansetron (ZOFRAN) tablet 4 mg (not administered)  hydroxypropyl methylcellulose / hypromellose (ISOPTO TEARS / GONIOVISC) 2.5 % ophthalmic solution 1 drop (not administered)  famotidine (PEPCID) tablet 10 mg (not administered)  multivitamin-lutein (OCUVITE-LUTEIN) capsule 1 capsule (not administered)  0.9 %  sodium chloride infusion ( Intravenous New Bag/Given 04/25/17 2150)  acetaminophen (TYLENOL) tablet 650 mg (not administered)    Or  acetaminophen (TYLENOL) suppository 650 mg (not administered)  ondansetron (ZOFRAN) injection 4 mg (not administered)  morphine 4 MG/ML injection 1 mg (not administered)  polyethylene glycol (MIRALAX / GLYCOLAX) packet 17 g (not administered)  sodium phosphate (FLEET) 7-19 GM/118ML enema 1 enema (not administered)  cefTRIAXone (ROCEPHIN) 1 g in dextrose 5 % 50 mL IVPB (not administered)  apixaban (ELIQUIS) tablet 2.5 mg (not administered)  hydrALAZINE (APRESOLINE) injection 10 mg (not administered)  oxyCODONE (Oxy IR/ROXICODONE) immediate release tablet 5 mg (not administered)  sodium chloride 0.9 % bolus 500 mL (0 mLs Intravenous Stopped 04/25/17 1826)  oxyCODONE (Oxy IR/ROXICODONE) immediate release tablet 5 mg (5 mg Oral Given 04/25/17 1908)     Initial Impression / Assessment and  Plan / ED Course  I have reviewed the triage vital signs and the nursing notes.  Pertinent labs & imaging results that were available during my care of the patient were reviewed by me and considered in my medical decision making (see chart for details).     Final Clinical Impressions(s) / ED Diagnoses   Final diagnoses:  Hydronephrosis, unspecified hydronephrosis type  Pelvic mass    Labs: CBC, CMP, Lipase, UA  Imaging: CT ABD Pelvis with   Consults: Urology, palliative care, [facility]  [employer]:  Discharge Meds:    Assessment/Plan: 83 year old female presents today with metastatic disease.  Patient has a large pelvic mass causing ureteral obstruction and bowel obstruction.  Patient was supposed to follow-up with urology tomorrow.  I touched base with urology who will come see the patient tomorrow to evaluate for nephrostomy tube or symptomatic care.  Patient is very realistic and does not want significant interventions as she realizes that they will not provide better quality of life.  Patient realizes that this will likely result in her death.  Patient would like to remain comfortable.  I have consulted palliative care who is discussed options with the patient.  Patient will be admitted to the hospitalist service urology will consult tomorrow, palliative care will consult tomorrow.  Patient had no further questions or concerns, [facility] consulted for admission.   New Prescriptions Current Discharge Medication List       Francee Gentile 04/25/17 2228    Gareth Morgan, MD 04/26/17 [phone]

## 2017-04-25 NOTE — Consult Note (Signed)
Palliative care progress note  Reason for consult: Goals of care in light of metastatic adenocarcinoma  I met today with Charlene [name], her son Simona Huh) and daughter in law Chief Operating Officer).   We discussed clinical course over the past few weeks as well as wishes moving forward in light of new diagnosis of recurrence of adenocarcinoma with liver involvement, ureteral stricture, and colonic narrowing.  She reports that she has lived a good life and is only interested in interventions that are likely to add time and quality to her life.  She is agreeable to have ureteral stent placed and would like to hear from Dr. Benay Spice if he feels that it is likely that further disease modifying therapy will add quality time to her life. She states that she does not think this is likely to be the case and, if so, she would like to focus only on interventions to control her symptoms.  We discussed hospice and how it may benefit her moving forward if we reached a point where she is not interested in pursuing further disease modifying therapy.  - Await results of CT scan - If she is admitted, palliative will follow-up tomorrow to continue conversation.  - If she is discharged, I have let her know that I would call Dr. Benay Spice to make sure that he is aware of our conversation.  Recommend she be discharged with oxycodone 61m tabs: take 1-2 tabs (5-178m every 4 hours as needed for pain.  Would also recommend discharge with miralax BID. - She is agreeable to hospice services once she has completed any disease modifying therapy that is likely to add time and quality to her life.  I think it is likely that this may be soon, possibly once she is evaluated for ureteral stent placement.  Total Time: 75 minutes Greater than 50%  of this time was spent counseling and coordinating care related to the above assessment and plan.  GeMicheline RoughMD [place] [phone]

## 2017-04-25 NOTE — ED Notes (Signed)
Bed: GX27 Expected date:  Expected time:  Means of arrival:  Comments: Hold for triage 2

## 2017-04-25 NOTE — H&P (Addendum)
TRH H&P   Patient Demographics:    Charlene [name], is a 83 y.o. female  MRN: [id number]   DOB - Apr 22, 1928  Admit Date - 04/25/2017  Outpatient Primary MD for the patient is Shon Baton, MD  Referring MD/NP/PA:     Okey Regal  Outpatient Specialists: Dr. Benay Spice (oncology)  Patient coming from: home  Chief Complaint  Patient presents with  . Constipation  . Suicidal      HPI:    Charlene [name]  is a 83 y.o. female, w metastatic colon cancer to liver, apparently c/o lower abdominal discomfort for the past week, and lack of bm.  + constipation.  silght nausea.  Denies fever, chills, emesis, diarrhea, brbpr.  + hematuria.  Pt presented to ED for evaluation of lower abdominal discomfort.    In ED,  Wbc 11.2,  Hgb 11.3, Bun/Creat 17/1.46,  CT scan abd/pelvis=> Chololithiasis. Small stone also in the common bile duct. This could ossibly be symptomatic. Small liver metastases previously demonstrated cannot be seen without contrast. No change would be expected over the last 6 days. Persistent left hydronephrosis due to ureteral obstruction in the left pelvic retroperitoneum where there is extensive recurrent tumor at the sigmoid anastomosis with local extension. Some relative large bowel obstruction with a large amount of fecal matter proximal to this region.  Metastatic retroperitoneal lymphadenopathy unchanged since the study of 6 days ago.    Review of systems:    In addition to the HPI above,  No Fever-chills, No Headache, No changes with Vision or hearing, No problems swallowing food or Liquids, No Chest pain, Cough or Shortness of Breath, No Abdominal pain, No Nausea or Vommitting, Bowel movements are regular, No Blood in stool or Urine, No dysuria, No new skin rashes or bruises, No new joints pains-aches,  No new weakness, tingling, numbness in any extremity, No  recent weight gain or loss, No polyuria, polydypsia or polyphagia, No significant Mental Stressors.  A full 10 point Review of Systems was done, except as stated above, all other Review of Systems were negative.   With Past History of the following :    Past Medical History:  Diagnosis Date  . Acute renal failure ([place])   . Allergy    seasonal  . BPPV (benign paroxysmal positional vertigo) 2012  . Colon cancer metastasized to liver ([place])   . Depression   . Diverticulitis   . Dyslipidemia   . GERD (gastroesophageal reflux disease)   . Hemorrhoids   . Hypertension 04/25/2017  . Macular degeneration   . Renal insufficiency 04/25/2017  . Urine frequency   . Uterine carcinoma [facility])       Past Surgical History:  Procedure Laterality Date  . ABDOMINAL HYSTERECTOMY    . COLECTOMY N/A 01/14/2017   Procedure: OPEN SIGMOIDECTOMY WITH REPAIR OF COLOVESICULAR FISTULA;  Surgeon: Leighton Ruff, MD;  Location: [facility] ORS;  Service: General;  Laterality: N/A;  . EYE SURGERY     cataract removal bilateral 2012  . FLEXIBLE SIGMOIDOSCOPY N/A 01/11/2017   Procedure: FLEXIBLE SIGMOIDOSCOPY;  Surgeon: Leighton Ruff, MD;  Location: [facility] ENDOSCOPY;  Service: Endoscopy;  Laterality: N/A;  . IR RADIOLOGIST EVAL & MGMT  02/11/2017  . TONSILLECTOMY        Social History:     Social History  Substance Use Topics  . Smoking status: Former Smoker    Quit date: 01/19/1973  . Smokeless tobacco: Never Used  . Alcohol use Yes     Comment: occ     Lives - at home    Family History :     Family History  Problem Relation Age of Onset  . Heart disease Mother   . Diabetes Mother   . Stroke Father   . Diabetes Father   . Cancer Brother        lung cancer  . Heart disease Brother       Home Medications:   Prior to Admission medications   Medication Sig Start Date End Date Taking? Authorizing Provider  apixaban (ELIQUIS) 2.5 MG TABS tablet Take 2.5 mg by mouth 2 (two) times daily.   Yes [provider]  Carboxymethylcellul-Glycerin (LUBRICATING EYE DROPS OP) Apply 1 drop to eye daily as needed (dry eyes).   Yes [provider]  docusate sodium (COLACE) 100 MG capsule Take 100 mg by mouth daily as needed for mild constipation.   Yes [provider]  famotidine (PEPCID AC) 10 MG chewable tablet Chew 2 tablets (20 mg total) by mouth at bedtime as needed for heartburn. Patient taking differently: Chew 10 mg by mouth at bedtime as needed for heartburn.  01/20/17  Yes Earnstine Regal, PA-C  HYDROcodone-acetaminophen (NORCO/VICODIN) 5-325 MG tablet Take 1 tablet by mouth every 6 (six) hours as needed for moderate pain. 04/21/17  Yes Owens Shark, NP  Multiple Vitamins-Minerals (MULTIVITAMIN ADULT PO) Take 1 tablet by mouth daily.    Yes [provider]  Multiple Vitamins-Minerals (PRESERVISION AREDS 2) CAPS Take 1 capsule by mouth daily.   Yes [provider]  ondansetron (ZOFRAN) 4 MG tablet Take 4 mg by mouth every 6 (six) hours as needed for nausea or vomiting.   Yes [provider]  sennosides-docusate sodium (SENOKOT-S) 8.6-50 MG tablet Take 2 tablets by mouth daily.   Yes [provider]  protein supplement (RESOURCE BENEPROTEIN) 6 g POWD You can use whatever protein supplement that taste good to you.  Try to get 2-3 cans per day.  Do this till your ready to go home and take care of yourself. Patient not taking: Reported on 02/11/2017 01/20/17   Earnstine Regal, PA-C  traMADol (ULTRAM) 50 MG tablet Take 1-2 tablets (50-100 mg total) by mouth every 6 (six) hours as needed for moderate pain or severe pain. Patient not taking: Reported on 03/22/2017 01/20/17   Earnstine Regal, PA-C     Allergies:    No Known Allergies   Physical Exam:   Vitals  Blood pressure (!) 187/99, pulse 65, temperature 97.7 F (36.5 C), temperature source Oral, resp. rate 18, SpO2 95 %.   1. General lying in bed in NAD,    2. Normal affect and  insight, Not Suicidal or Homicidal, Awake Alert, Oriented X 3.  3. No F.N deficits, ALL C.Nerves Intact, Strength 5/5 all 4 extremities, Sensation intact all 4 extremities, Plantars down going.  4. Ears and Eyes appear  Normal, Conjunctivae clear, PERRLA. Moist Oral Mucosa.  5. Supple Neck, No JVD, No cervical lymphadenopathy appriciated, No Carotid Bruits.  6. Symmetrical Chest wall movement, Good air movement bilaterally, CTAB.  7. RRR, No Gallops, Rubs or Murmurs, No Parasternal Heave.  8. Positive Bowel Sounds, Abdomen Soft, No tenderness, No organomegaly appriciated,No rebound -guarding or rigidity.  9.  No Cyanosis, Normal Skin Turgor, No Skin Rash or Bruise.  10. Good muscle tone,  joints appear normal , no effusions, Normal ROM.  11. No Palpable Lymph Nodes in Neck or Axillae     Data Review:    CBC  Recent Labs Lab 04/25/17 1504  WBC 11.2*  HGB 11.3*  HCT 34.2*  PLT 395  MCV 80.9  MCH 26.7  MCHC 33.0  RDW 17.4*   ------------------------------------------------------------------------------------------------------------------  Chemistries   Recent Labs Lab 04/19/17 1044 04/25/17 1504  NA 136 135  K 4.5 4.8  CL  --  100*  CO2 23 26  GLUCOSE 96 86  BUN 19.9 17  CREATININE 1.4* 1.46*  CALCIUM 9.7 9.2  AST  --  35  ALT  --  15  ALKPHOS  --  57  BILITOT  --  1.2   ------------------------------------------------------------------------------------------------------------------ CrCl cannot be calculated (Unknown ideal weight.). ------------------------------------------------------------------------------------------------------------------ No results for input(s): TSH, T4TOTAL, T3FREE, THYROIDAB in the last 72 hours.  Invalid input(s): FREET3  Coagulation profile No results for input(s): INR, PROTIME in the last 168 hours. ------------------------------------------------------------------------------------------------------------------- No  results for input(s): DDIMER in the last 72 hours. -------------------------------------------------------------------------------------------------------------------  Cardiac Enzymes No results for input(s): CKMB, TROPONINI, MYOGLOBIN in the last 168 hours.  Invalid input(s): CK ------------------------------------------------------------------------------------------------------------------ No results found for: BNP   ---------------------------------------------------------------------------------------------------------------  Urinalysis    Component Value Date/Time   COLORURINE YELLOW 04/25/2017 1310   APPEARANCEUR HAZY (A) 04/25/2017 1310   LABSPEC 1.020 04/25/2017 1310   PHURINE 5.0 04/25/2017 1310   GLUCOSEU NEGATIVE 04/25/2017 1310   HGBUR MODERATE (A) 04/25/2017 1310   BILIRUBINUR NEGATIVE 04/25/2017 1310   KETONESUR 20 (A) 04/25/2017 1310   PROTEINUR 100 (A) 04/25/2017 1310   UROBILINOGEN 0.2 05/05/2015 1840   NITRITE NEGATIVE 04/25/2017 1310   LEUKOCYTESUR TRACE (A) 04/25/2017 1310    ----------------------------------------------------------------------------------------------------------------   Imaging Results:    Ct Abdomen Pelvis Wo Contrast  Result Date: 04/25/2017 CLINICAL DATA:  Abdominal cramping over the last week. Emesis. Recent colon resection. EXAM: CT ABDOMEN AND PELVIS WITHOUT CONTRAST TECHNIQUE: Multidetector CT imaging of the abdomen and pelvis was performed following the standard protocol without IV contrast. COMPARISON:  04/19/2017 FINDINGS: Lower chest: No acute finding.  Hiatal hernia. Hepatobiliary: Small metastatic lesions within the liver demonstrated on the previous contrast-enhanced study cannot be visualized on today's noncontrast exam. These would presumably be unchanged. Small gallstones again visible dependent within the gallbladder. Small stone in the distal common bile duct. This could be symptomatic. Pancreas: Negative Spleen: Negative  Adrenals/Urinary Tract: Adrenal glands remain normal. Right kidney contains of small cyst in the lower pole. Small nonobstructing stone in the lower pole. No acute renal finding on the right. Left kidney again shows an upper pole cyst in shows hydroureteronephrosis with the ureter being dilated all the way to the pelvis. There is re- roll obstruction in the pelvis due to recurrent tumor. No primary bladder lesion is seen. Stomach/Bowel: There is recurrent tumor at the sigmoid colon anastomosis site which is locally extensive. This causes some degree of obstruction as well with relative constipation proximal to that. Vascular/Lymphatic: Aortic atherosclerosis is again demonstrated.  Major veins are patent. Left-sided retroperitoneal adenopathy with low-density/necrosis as seen previously. Index node image 42 again measures 2.2 x 2.0 cm. Reproductive: No primary finding. Other: No free air. Musculoskeletal: Chronic spinal degenerative changes. IMPRESSION: Chololithiasis. Small stone also in the common bile duct. This could possibly be symptomatic. Small liver metastases previously demonstrated cannot be seen without contrast. No change would be expected over the last 6 days. Persistent left hydronephrosis due to ureteral obstruction in the left pelvic retroperitoneum where there is extensive recurrent tumor at the sigmoid anastomosis with local extension. Some relative large bowel obstruction with a large amount of fecal matter proximal to this region. Metastatic retroperitoneal lymphadenopathy unchanged since the study of 6 days ago. Electronically Signed   By: Nelson Chimes M.D.   On: 04/25/2017 19:31      Assessment & Plan:    Principal Problem:   Abdominal pain Active Problems:   UTI (urinary tract infection)   Renal insufficiency   Anemia   Hypertension    Abdominal pain Large Bowel obstruction NPO except for medications Ns iv Surgery consult called , appreciate their input  Uti Rocephin 1gm  iv qday Await urine culture  Renal insufficiency/ Lhydronephrosis ? Need for nephrostomy Urology consult appreciated Check cmp in am  Anemia Repeat cbc in am  Metastatic colon cancer to liver  Palliative care consult appreciated  DVT Continue eliquis   DVT Prophylaxis eliquis, SCDs  AM Labs Ordered, also please review Full Orders  Family Communication: Admission, patients condition and plan of care including tests being ordered have been discussed with the patient  who indicate understanding and agree with the plan and Code Status.  Code Status  DNR  Likely DC to  home  Condition GUARDED    Consults called: urology by ED, palliative care by ED   Admission status: observation  Time spent in minutes : 45   Jani Gravel M.D on 04/25/2017 at 8:31 PM  Between [place] - Pager - [phone]. After 7pm go to [email or web address] - password [facility]  [facility] - Office  [phone]

## 2017-04-25 NOTE — ED Notes (Signed)
Patient states that she has tried and can not give urine sample at this time.

## 2017-04-25 NOTE — ED Triage Notes (Addendum)
Pt reports cramping for the past week. LBM a week ago. Occasional emesis. Hx of colon resection in April. Pt also reports SI. No plan

## 2017-04-26 DIAGNOSIS — E871 Hypo-osmolality and hyponatremia: Secondary | ICD-10-CM | POA: Diagnosis not present

## 2017-04-26 DIAGNOSIS — J90 Pleural effusion, not elsewhere classified: Secondary | ICD-10-CM | POA: Diagnosis not present

## 2017-04-26 DIAGNOSIS — C187 Malignant neoplasm of sigmoid colon: Secondary | ICD-10-CM | POA: Diagnosis not present

## 2017-04-26 DIAGNOSIS — N1330 Unspecified hydronephrosis: Secondary | ICD-10-CM | POA: Diagnosis not present

## 2017-04-26 DIAGNOSIS — C772 Secondary and unspecified malignant neoplasm of intra-abdominal lymph nodes: Secondary | ICD-10-CM | POA: Diagnosis present

## 2017-04-26 DIAGNOSIS — Z48815 Encounter for surgical aftercare following surgery on the digestive system: Secondary | ICD-10-CM | POA: Diagnosis not present

## 2017-04-26 DIAGNOSIS — R21 Rash and other nonspecific skin eruption: Secondary | ICD-10-CM | POA: Diagnosis not present

## 2017-04-26 DIAGNOSIS — Z87891 Personal history of nicotine dependence: Secondary | ICD-10-CM | POA: Diagnosis not present

## 2017-04-26 DIAGNOSIS — R109 Unspecified abdominal pain: Secondary | ICD-10-CM | POA: Diagnosis not present

## 2017-04-26 DIAGNOSIS — Z7189 Other specified counseling: Secondary | ICD-10-CM | POA: Diagnosis not present

## 2017-04-26 DIAGNOSIS — I82409 Acute embolism and thrombosis of unspecified deep veins of unspecified lower extremity: Secondary | ICD-10-CM | POA: Diagnosis not present

## 2017-04-26 DIAGNOSIS — K56609 Unspecified intestinal obstruction, unspecified as to partial versus complete obstruction: Secondary | ICD-10-CM | POA: Diagnosis not present

## 2017-04-26 DIAGNOSIS — N136 Pyonephrosis: Secondary | ICD-10-CM | POA: Diagnosis present

## 2017-04-26 DIAGNOSIS — Z90710 Acquired absence of both cervix and uterus: Secondary | ICD-10-CM | POA: Diagnosis not present

## 2017-04-26 DIAGNOSIS — R100 Acute abdomen: Secondary | ICD-10-CM | POA: Diagnosis not present

## 2017-04-26 DIAGNOSIS — N390 Urinary tract infection, site not specified: Secondary | ICD-10-CM | POA: Diagnosis not present

## 2017-04-26 DIAGNOSIS — K5900 Constipation, unspecified: Secondary | ICD-10-CM | POA: Diagnosis not present

## 2017-04-26 DIAGNOSIS — Z8542 Personal history of malignant neoplasm of other parts of uterus: Secondary | ICD-10-CM | POA: Diagnosis not present

## 2017-04-26 DIAGNOSIS — K5649 Other impaction of intestine: Secondary | ICD-10-CM | POA: Diagnosis not present

## 2017-04-26 DIAGNOSIS — C189 Malignant neoplasm of colon, unspecified: Secondary | ICD-10-CM | POA: Diagnosis not present

## 2017-04-26 DIAGNOSIS — R1030 Lower abdominal pain, unspecified: Secondary | ICD-10-CM | POA: Diagnosis not present

## 2017-04-26 DIAGNOSIS — Z6821 Body mass index (BMI) 21.0-21.9, adult: Secondary | ICD-10-CM | POA: Diagnosis not present

## 2017-04-26 DIAGNOSIS — Z933 Colostomy status: Secondary | ICD-10-CM | POA: Diagnosis not present

## 2017-04-26 DIAGNOSIS — K567 Ileus, unspecified: Secondary | ICD-10-CM | POA: Diagnosis not present

## 2017-04-26 DIAGNOSIS — J9811 Atelectasis: Secondary | ICD-10-CM | POA: Diagnosis not present

## 2017-04-26 DIAGNOSIS — K5641 Fecal impaction: Secondary | ICD-10-CM | POA: Diagnosis not present

## 2017-04-26 DIAGNOSIS — D630 Anemia in neoplastic disease: Secondary | ICD-10-CM | POA: Diagnosis present

## 2017-04-26 DIAGNOSIS — C787 Secondary malignant neoplasm of liver and intrahepatic bile duct: Secondary | ICD-10-CM | POA: Diagnosis not present

## 2017-04-26 DIAGNOSIS — K649 Unspecified hemorrhoids: Secondary | ICD-10-CM | POA: Diagnosis not present

## 2017-04-26 DIAGNOSIS — I10 Essential (primary) hypertension: Secondary | ICD-10-CM | POA: Diagnosis not present

## 2017-04-26 DIAGNOSIS — R1900 Intra-abdominal and pelvic swelling, mass and lump, unspecified site: Secondary | ICD-10-CM | POA: Diagnosis not present

## 2017-04-26 DIAGNOSIS — C7989 Secondary malignant neoplasm of other specified sites: Secondary | ICD-10-CM | POA: Diagnosis not present

## 2017-04-26 DIAGNOSIS — K219 Gastro-esophageal reflux disease without esophagitis: Secondary | ICD-10-CM | POA: Diagnosis present

## 2017-04-26 DIAGNOSIS — N179 Acute kidney failure, unspecified: Secondary | ICD-10-CM | POA: Diagnosis present

## 2017-04-26 DIAGNOSIS — E785 Hyperlipidemia, unspecified: Secondary | ICD-10-CM | POA: Diagnosis present

## 2017-04-26 DIAGNOSIS — Z515 Encounter for palliative care: Secondary | ICD-10-CM | POA: Diagnosis not present

## 2017-04-26 DIAGNOSIS — Z66 Do not resuscitate: Secondary | ICD-10-CM | POA: Diagnosis present

## 2017-04-26 DIAGNOSIS — E46 Unspecified protein-calorie malnutrition: Secondary | ICD-10-CM | POA: Diagnosis present

## 2017-04-26 DIAGNOSIS — Z9049 Acquired absence of other specified parts of digestive tract: Secondary | ICD-10-CM | POA: Diagnosis not present

## 2017-04-26 DIAGNOSIS — Z86718 Personal history of other venous thrombosis and embolism: Secondary | ICD-10-CM | POA: Diagnosis not present

## 2017-04-26 DIAGNOSIS — N289 Disorder of kidney and ureter, unspecified: Secondary | ICD-10-CM | POA: Diagnosis not present

## 2017-04-26 DIAGNOSIS — R310 Gross hematuria: Secondary | ICD-10-CM | POA: Diagnosis not present

## 2017-04-26 DIAGNOSIS — M6281 Muscle weakness (generalized): Secondary | ICD-10-CM | POA: Diagnosis not present

## 2017-04-26 DIAGNOSIS — R970 Elevated carcinoembryonic antigen [CEA]: Secondary | ICD-10-CM | POA: Diagnosis not present

## 2017-04-26 DIAGNOSIS — R278 Other lack of coordination: Secondary | ICD-10-CM | POA: Diagnosis not present

## 2017-04-26 DIAGNOSIS — R1084 Generalized abdominal pain: Secondary | ICD-10-CM | POA: Diagnosis not present

## 2017-04-26 DIAGNOSIS — K56699 Other intestinal obstruction unspecified as to partial versus complete obstruction: Secondary | ICD-10-CM | POA: Diagnosis not present

## 2017-04-26 DIAGNOSIS — K5669 Other partial intestinal obstruction: Secondary | ICD-10-CM | POA: Diagnosis not present

## 2017-04-26 DIAGNOSIS — Z801 Family history of malignant neoplasm of trachea, bronchus and lung: Secondary | ICD-10-CM | POA: Diagnosis not present

## 2017-04-26 DIAGNOSIS — D72829 Elevated white blood cell count, unspecified: Secondary | ICD-10-CM | POA: Diagnosis not present

## 2017-04-26 LAB — COMPREHENSIVE METABOLIC PANEL
ALT: 13 U/L — AB (ref 14–54)
AST: 21 U/L (ref 15–41)
Albumin: 3.1 g/dL — ABNORMAL LOW (ref 3.5–5.0)
Alkaline Phosphatase: 49 U/L (ref 38–126)
Anion gap: 8 (ref 5–15)
BUN: 15 mg/dL (ref 6–20)
CHLORIDE: 101 mmol/L (ref 101–111)
CO2: 24 mmol/L (ref 22–32)
CREATININE: 1.2 mg/dL — AB (ref 0.44–1.00)
Calcium: 8.5 mg/dL — ABNORMAL LOW (ref 8.9–10.3)
GFR calc non Af Amer: 40 mL/min — ABNORMAL LOW (ref >60)
GFR, EST AFRICAN AMERICAN: 46 mL/min — AB (ref >60)
Glucose, Bld: 94 mg/dL (ref 65–99)
Potassium: 4.3 mmol/L (ref 3.5–5.1)
SODIUM: 133 mmol/L — AB (ref 135–145)
Total Bilirubin: 0.7 mg/dL (ref 0.3–1.2)
Total Protein: 6.3 g/dL — ABNORMAL LOW (ref 6.5–8.1)

## 2017-04-26 LAB — CBC
HCT: 31.9 % — ABNORMAL LOW (ref 36.0–46.0)
Hemoglobin: 10.4 g/dL — ABNORMAL LOW (ref 12.0–15.0)
MCH: 26.3 pg (ref 26.0–34.0)
MCHC: 32.6 g/dL (ref 30.0–36.0)
MCV: 80.6 fL (ref 78.0–100.0)
PLATELETS: 335 10*3/uL (ref 150–400)
RBC: 3.96 MIL/uL (ref 3.87–5.11)
RDW: 17.2 % — ABNORMAL HIGH (ref 11.5–15.5)
WBC: 12.1 10*3/uL — ABNORMAL HIGH (ref 4.0–10.5)

## 2017-04-26 LAB — APTT: aPTT: 41 seconds — ABNORMAL HIGH (ref 24–36)

## 2017-04-26 LAB — PROTIME-INR
INR: 1.37
PROTHROMBIN TIME: 16.9 s — AB (ref 11.4–15.2)

## 2017-04-26 MED ORDER — OXYCODONE HCL 5 MG PO TABS
5.0000 mg | ORAL_TABLET | ORAL | Status: DC | PRN
  Administered 2017-04-27 – 2017-05-01 (×11): 5 mg via ORAL
  Filled 2017-04-26 (×11): qty 1

## 2017-04-26 MED ORDER — SODIUM CHLORIDE 0.9 % IV SOLN
INTRAVENOUS | Status: DC
  Administered 2017-04-26 – 2017-04-30 (×10): via INTRAVENOUS

## 2017-04-26 NOTE — Progress Notes (Signed)
Tap water enema and manual disimpaction done as ordered.  retrieved a small amount of hard clay like stool.  Minimal bleeding noted.

## 2017-04-26 NOTE — Progress Notes (Signed)
Third tap water enema and manual disimpaction not done.  Pt refused, states, "my bottom is too sore".  She agrees to one tomorrow if still advised and will try to have a bowel movement today.

## 2017-04-26 NOTE — Progress Notes (Signed)
PROGRESS NOTE    Charlene [name]  PZW:[id number] DOB: August 09, 1928 DOA: 04/25/2017 PCP: Shon Baton, MD     Brief Narrative:  Charlene [name] is a 83 yo female with PMHx of metastatic colon cancer to liver who presents with lower abdominal pain, lack of bowel movement in > 1 week, nausea. CT abdomen and pelvis was completed emergency department, which showed small liver metastases, persistent left hydronephrosis due to ureteral obstruction in the left pelvic retroperitoneum, large bowel obstruction with a large amount of fecal matter proximal to this region. Urology, general surgery, gastroenterology has been consulted.  Assessment & Plan:   Principal Problem:   Abdominal pain Active Problems:   UTI (urinary tract infection)   Renal insufficiency   Anemia   Hypertension  Large bowel obstruction  -GI and general surgery has been consulted. Per GI, colonic stent is not feasible due to tumor length and tortuosity of colon. Recommending diverting colostomy.  -Enema ordered by GI   Acute kidney injury secondary to obstruction, with left hydronephrosis -Baseline Cr 0.63  -Urology consulted, no emergent indication for intervention at this point. Could consider left perc nephrostomy tube by IR   -Trend BMP and avoid nephrotoxins   Urinary tract infection, POA -Urine culture pending -Continue Rocephin  Metastatic colon cancer to the liver s/p open sigmoidectomy with repair of colovesicular fistula  -Patient follows with Dr. Benay Spice -Palliative care also following   DVT -Holding eliquis for now pending possible surgical intervention    DVT prophylaxis: Holding eliquis for now, pending possible surgical intervention Code Status: DNR Family Communication: At bedside Disposition Plan: Pending consulting services plan, possible surgical intervention   Consultants:   Oncology Dr. Benay Spice  General Surgery  Urology  GI  Procedures:   None  Antimicrobials:  Anti-infectives      Start     Dose/Rate Route Frequency Ordered Stop   04/25/17 2100  cefTRIAXone (ROCEPHIN) 1 g in dextrose 5 % 50 mL IVPB     1 g 100 mL/hr over 30 Minutes Intravenous Every 24 hours 04/25/17 2052         Subjective: Patient states that she feels a little bit better this morning. She had episode of vomiting overnight, is passing gas but no bowel movement in over a week. Overall, patient is in good spirits, no complaints of chest pain or shortness of breath, no cough, no longer nauseated, continues to have abdominal pain.  Objective: Vitals:   04/25/17 2145 04/25/17 2251 04/26/17 0549 04/26/17 0550  BP: (!) 192/66 (!) 143/52 (!) 125/50   Pulse: 60 75 69   Resp: 17  18   Temp: 98.6 F (37 C)  98.5 F (36.9 C)   TempSrc: Oral  Oral   SpO2: 95%  95%   Weight: 57.1 kg (125 lb 14.1 oz)   57.8 kg (127 lb 6.8 oz)  Height: 5\' 4"  (1.626 m)       Intake/Output Summary (Last 24 hours) at 04/26/17 1216 Last data filed at 04/26/17 0740  Gross per 24 hour  Intake           466.67 ml  Output              400 ml  Net            66.67 ml   Filed Weights   04/25/17 2145 04/26/17 0550  Weight: 57.1 kg (125 lb 14.1 oz) 57.8 kg (127 lb 6.8 oz)    Examination:  General exam: Appears  calm and comfortable  Respiratory system: Clear to auscultation. Respiratory effort normal. Cardiovascular system: S1 & S2 heard, RRR. No JVD, murmurs, rubs, gallops or clicks. No pedal edema. Gastrointestinal system: Abdomen is nondistended, soft and +TTP central and lower abdomen. No organomegaly or masses felt. +bowel sounds heard. Soft  Central nervous system: Alert and oriented. No focal neurological deficits. Extremities: Symmetric 5 x 5 power. Skin: No rashes, lesions or ulcers Psychiatry: Judgement and insight appear normal. Mood & affect appropriate.   Data Reviewed: I have personally reviewed following labs and imaging studies  CBC:  Recent Labs Lab 04/25/17 1504 04/26/17 0443  WBC 11.2* 12.1*   HGB 11.3* 10.4*  HCT 34.2* 31.9*  MCV 80.9 80.6  PLT 395 696   Basic Metabolic Panel:  Recent Labs Lab 04/25/17 1504 04/26/17 0443  NA 135 133*  K 4.8 4.3  CL 100* 101  CO2 26 24  GLUCOSE 86 94  BUN 17 15  CREATININE 1.46* 1.20*  CALCIUM 9.2 8.5*   GFR: Estimated Creatinine Clearance: 29.1 mL/min (A) (by C-G formula based on SCr of 1.2 mg/dL (H)). Liver Function Tests:  Recent Labs Lab 04/25/17 1504 04/26/17 0443  AST 35 21  ALT 15 13*  ALKPHOS 57 49  BILITOT 1.2 0.7  PROT 7.5 6.3*  ALBUMIN 3.7 3.1*    Recent Labs Lab 04/25/17 1504  LIPASE 24   No results for input(s): AMMONIA in the last 168 hours. Coagulation Profile:  Recent Labs Lab 04/26/17 0443  INR 1.37   Cardiac Enzymes: No results for input(s): CKTOTAL, CKMB, CKMBINDEX, TROPONINI in the last 168 hours. BNP (last 3 results) No results for input(s): PROBNP in the last 8760 hours. HbA1C: No results for input(s): HGBA1C in the last 72 hours. CBG: No results for input(s): GLUCAP in the last 168 hours. Lipid Profile: No results for input(s): CHOL, HDL, LDLCALC, TRIG, CHOLHDL, LDLDIRECT in the last 72 hours. Thyroid Function Tests: No results for input(s): TSH, T4TOTAL, FREET4, T3FREE, THYROIDAB in the last 72 hours. Anemia Panel: No results for input(s): VITAMINB12, FOLATE, FERRITIN, TIBC, IRON, RETICCTPCT in the last 72 hours. Sepsis Labs: No results for input(s): PROCALCITON, LATICACIDVEN in the last 168 hours.  No results found for this or any previous visit (from the past 240 hour(s)).     Radiology Studies: Ct Abdomen Pelvis Wo Contrast  Result Date: 04/25/2017 CLINICAL DATA:  Abdominal cramping over the last week. Emesis. Recent colon resection. EXAM: CT ABDOMEN AND PELVIS WITHOUT CONTRAST TECHNIQUE: Multidetector CT imaging of the abdomen and pelvis was performed following the standard protocol without IV contrast. COMPARISON:  04/19/2017 FINDINGS: Lower chest: No acute finding.   Hiatal hernia. Hepatobiliary: Small metastatic lesions within the liver demonstrated on the previous contrast-enhanced study cannot be visualized on today's noncontrast exam. These would presumably be unchanged. Small gallstones again visible dependent within the gallbladder. Small stone in the distal common bile duct. This could be symptomatic. Pancreas: Negative Spleen: Negative Adrenals/Urinary Tract: Adrenal glands remain normal. Right kidney contains of small cyst in the lower pole. Small nonobstructing stone in the lower pole. No acute renal finding on the right. Left kidney again shows an upper pole cyst in shows hydroureteronephrosis with the ureter being dilated all the way to the pelvis. There is re- roll obstruction in the pelvis due to recurrent tumor. No primary bladder lesion is seen. Stomach/Bowel: There is recurrent tumor at the sigmoid colon anastomosis site which is locally extensive. This causes some degree of obstruction as well  with relative constipation proximal to that. Vascular/Lymphatic: Aortic atherosclerosis is again demonstrated. Major veins are patent. Left-sided retroperitoneal adenopathy with low-density/necrosis as seen previously. Index node image 42 again measures 2.2 x 2.0 cm. Reproductive: No primary finding. Other: No free air. Musculoskeletal: Chronic spinal degenerative changes. IMPRESSION: Chololithiasis. Small stone also in the common bile duct. This could possibly be symptomatic. Small liver metastases previously demonstrated cannot be seen without contrast. No change would be expected over the last 6 days. Persistent left hydronephrosis due to ureteral obstruction in the left pelvic retroperitoneum where there is extensive recurrent tumor at the sigmoid anastomosis with local extension. Some relative large bowel obstruction with a large amount of fecal matter proximal to this region. Metastatic retroperitoneal lymphadenopathy unchanged since the study of 6 days ago.  Electronically Signed   By: Nelson Chimes M.D.   On: 04/25/2017 19:31      Scheduled Meds: . multivitamin-lutein  1 capsule Oral Daily  . senna-docusate  2 tablet Oral Daily   Continuous Infusions: . sodium chloride 100 mL/hr at 04/26/17 0929  . cefTRIAXone (ROCEPHIN)  IV Stopped (04/25/17 2303)     LOS: 0 days    Time spent: 30 minutes   Dessa Phi, DO [facility] [email or web address] Password [facility] 04/26/2017, 12:16 PM

## 2017-04-26 NOTE — Telephone Encounter (Signed)
Currently hospitalized.

## 2017-04-26 NOTE — Consult Note (Signed)
[facility] Surgery Consult Note  Charlene [name] 07/18/1928  [id number].    Requesting MD: Jani Gravel Chief Complaint/Reason for Consult: Large bowel obstruction  HPI:  Charlene [name] is an 83yo female PMH metastatic colon cancer to liver s/p open sigmoidectomy with repair of colovesicular fistula 01/14/17 by Dr. Marcello Moores, who presented to [facility] 7/22 with a large bowel obstruction. Patient states that ever since surgery in April she has had a suppressed appetite and felt much weaker. Over the last week she has had progressive lower abdominal pain, nausea, and 1 episode of emesis. States that her last BM was 1 week ago. She is passing a small amount of flatus. She has not been able to tolerate much PO x1 week. Since admission and receiving IVF, states that she is feeling a little better. Denies any current abdominal pain. No n/v. Still passing small amount of flatus. CT scan showed large bowel obstruction due to recurrent tumor at sigmoid anastomosis with large amount of fecal matter proximal to this region, left hydronephrosis, and likely small metastatic liver lesions.  Urology, palliative, and internal medicine following.  - PMH significant for metastatic colon cancer to liver, DVT on elquis, h/o uterine cancer s/p hysterectomy - Abdominal surgical history: open sigmoidectomy with repair of colovesicular fistula 01/14/17 by Dr. Marcello Moores; hysterectomy - Anticoagulants: eliquis - Lives at home alone, has daily home health  ROS: Review of Systems  Constitutional: Positive for malaise/fatigue.  HENT: Negative.   Eyes: Negative.   Respiratory: Negative.   Cardiovascular: Negative.   Gastrointestinal: Positive for abdominal pain, constipation, nausea and vomiting. Negative for blood in stool, diarrhea, heartburn and melena.  Genitourinary: Positive for hematuria.  Musculoskeletal: Negative.   Skin: Negative.   Neurological: Positive for weakness.  Psychiatric/Behavioral: Negative.    All  systems reviewed and otherwise negative except for as above  Family History  Problem Relation Age of Onset  . Heart disease Mother   . Diabetes Mother   . Stroke Father   . Diabetes Father   . Cancer Brother        lung cancer  . Heart disease Brother     Past Medical History:  Diagnosis Date  . Acute renal failure ([place])   . Allergy    seasonal  . BPPV (benign paroxysmal positional vertigo) 2012  . Colon cancer metastasized to liver ([place])   . Depression   . Diverticulitis   . Dyslipidemia   . GERD (gastroesophageal reflux disease)   . Hemorrhoids   . Hypertension 04/25/2017  . Macular degeneration   . Renal insufficiency 04/25/2017  . Urine frequency   . Uterine carcinoma [facility])     Past Surgical History:  Procedure Laterality Date  . ABDOMINAL HYSTERECTOMY    . COLECTOMY N/A 01/14/2017   Procedure: OPEN SIGMOIDECTOMY WITH REPAIR OF COLOVESICULAR FISTULA;  Surgeon: Leighton Ruff, MD;  Location: [facility] ORS;  Service: General;  Laterality: N/A;  . EYE SURGERY     cataract removal bilateral 2012  . FLEXIBLE SIGMOIDOSCOPY N/A 01/11/2017   Procedure: FLEXIBLE SIGMOIDOSCOPY;  Surgeon: Leighton Ruff, MD;  Location: [facility] ENDOSCOPY;  Service: Endoscopy;  Laterality: N/A;  . IR RADIOLOGIST EVAL & MGMT  02/11/2017  . TONSILLECTOMY      Social History:  reports that she quit smoking about 44 years ago. She has never used smokeless tobacco. She reports that she drinks alcohol. She reports that she does not use drugs.  Allergies: No Known Allergies  Medications Prior to Admission  Medication Sig  Dispense Refill  . apixaban (ELIQUIS) 2.5 MG TABS tablet Take 2.5 mg by mouth 2 (two) times daily.    . Carboxymethylcellul-Glycerin (LUBRICATING EYE DROPS OP) Apply 1 drop to eye daily as needed (dry eyes).    Marland Kitchen docusate sodium (COLACE) 100 MG capsule Take 100 mg by mouth daily as needed for mild constipation.    . famotidine (PEPCID AC) 10 MG chewable tablet Chew 2 tablets (20 mg total) by mouth  at bedtime as needed for heartburn. (Patient taking differently: Chew 10 mg by mouth at bedtime as needed for heartburn. )    . HYDROcodone-acetaminophen (NORCO/VICODIN) 5-325 MG tablet Take 1 tablet by mouth every 6 (six) hours as needed for moderate pain. 40 tablet 0  . Multiple Vitamins-Minerals (MULTIVITAMIN ADULT PO) Take 1 tablet by mouth daily.     . Multiple Vitamins-Minerals (PRESERVISION AREDS 2) CAPS Take 1 capsule by mouth daily.    . ondansetron (ZOFRAN) 4 MG tablet Take 4 mg by mouth every 6 (six) hours as needed for nausea or vomiting.    . sennosides-docusate sodium (SENOKOT-S) 8.6-50 MG tablet Take 2 tablets by mouth daily.    . protein supplement (RESOURCE BENEPROTEIN) 6 g POWD You can use whatever protein supplement that taste good to you.  Try to get 2-3 cans per day.  Do this till your ready to go home and take care of yourself. (Patient not taking: Reported on 02/11/2017)    . traMADol (ULTRAM) 50 MG tablet Take 1-2 tablets (50-100 mg total) by mouth every 6 (six) hours as needed for moderate pain or severe pain. (Patient not taking: Reported on 03/22/2017) 40 tablet 0    Prior to Admission medications   Medication Sig Start Date End Date Taking? Authorizing Provider  apixaban (ELIQUIS) 2.5 MG TABS tablet Take 2.5 mg by mouth 2 (two) times daily.   Yes [provider]  Carboxymethylcellul-Glycerin (LUBRICATING EYE DROPS OP) Apply 1 drop to eye daily as needed (dry eyes).   Yes [provider]  docusate sodium (COLACE) 100 MG capsule Take 100 mg by mouth daily as needed for mild constipation.   Yes [provider]  famotidine (PEPCID AC) 10 MG chewable tablet Chew 2 tablets (20 mg total) by mouth at bedtime as needed for heartburn. Patient taking differently: Chew 10 mg by mouth at bedtime as needed for heartburn.  01/20/17  Yes Earnstine Regal, PA-C  HYDROcodone-acetaminophen (NORCO/VICODIN) 5-325 MG tablet Take 1 tablet by mouth every 6 (six) hours as  needed for moderate pain. 04/21/17  Yes Owens Shark, NP  Multiple Vitamins-Minerals (MULTIVITAMIN ADULT PO) Take 1 tablet by mouth daily.    Yes [provider]  Multiple Vitamins-Minerals (PRESERVISION AREDS 2) CAPS Take 1 capsule by mouth daily.   Yes [provider]  ondansetron (ZOFRAN) 4 MG tablet Take 4 mg by mouth every 6 (six) hours as needed for nausea or vomiting.   Yes [provider]  sennosides-docusate sodium (SENOKOT-S) 8.6-50 MG tablet Take 2 tablets by mouth daily.   Yes [provider]  protein supplement (RESOURCE BENEPROTEIN) 6 g POWD You can use whatever protein supplement that taste good to you.  Try to get 2-3 cans per day.  Do this till your ready to go home and take care of yourself. Patient not taking: Reported on 02/11/2017 01/20/17   Earnstine Regal, PA-C  traMADol (ULTRAM) 50 MG tablet Take 1-2 tablets (50-100 mg total) by mouth every 6 (six) hours as needed for moderate  pain or severe pain. Patient not taking: Reported on 03/22/2017 01/20/17   Earnstine Regal, PA-C    Blood pressure (!) 125/50, pulse 69, temperature 98.5 F (36.9 C), temperature source Oral, resp. rate 18, height _0  (1.626 m), weight 127 lb 6.8 oz (57.8 kg), SpO2 95 %. Physical Exam: General: pleasant, frail appearing white female who is laying in bed in NAD HEENT: head is normocephalic, atraumatic.  Sclera are noninjected.  Pupils equal and round.  Ears and nose without any masses or lesions.  Mouth is pink and moist. Dentition fair Heart: regular, rate, and rhythm.  No obvious murmurs, gallops, or rubs noted.  Palpable pedal pulses bilaterally Lungs: CTAB, no wheezes, rhonchi, or rales noted.  Respiratory effort nonlabored Abd: well healed midline incision, soft, ND, mild lower abdominal tenderness, +BS, no masses, hernias, or organomegaly. No guarding or rebound MS: all 4 extremities are symmetrical with no cyanosis, clubbing, or edema. Skin: warm and dry  with no masses, lesions, or rashes Psych: A&Ox3 with an appropriate affect. Neuro: cranial nerves grossly intact, extremity CSM intact bilaterally, normal speech  Results for orders placed or performed during the hospital encounter of 04/25/17 (from the past 48 hour(s))  Urinalysis, Routine w reflex microscopic     Status: Abnormal   Collection Time: 04/25/17  1:10 PM  Result Value Ref Range   Color, Urine YELLOW YELLOW   APPearance HAZY (A) CLEAR   Specific Gravity, Urine 1.020 1.005 - 1.030   pH 5.0 5.0 - 8.0   Glucose, UA NEGATIVE NEGATIVE mg/dL   Hgb urine dipstick MODERATE (A) NEGATIVE   Bilirubin Urine NEGATIVE NEGATIVE   Ketones, ur 20 (A) NEGATIVE mg/dL   Protein, ur 100 (A) NEGATIVE mg/dL   Nitrite NEGATIVE NEGATIVE   Leukocytes, UA TRACE (A) NEGATIVE   RBC / HPF TOO NUMEROUS TO COUNT 0 - 5 RBC/hpf   WBC, UA TOO NUMEROUS TO COUNT 0 - 5 WBC/hpf   Bacteria, UA FEW (A) NONE SEEN   Squamous Epithelial / LPF 0-5 (A) NONE SEEN   Mucous PRESENT   Lipase, blood     Status: None   Collection Time: 04/25/17  3:04 PM  Result Value Ref Range   Lipase 24 11 - 51 U/L  Comprehensive metabolic panel     Status: Abnormal   Collection Time: 04/25/17  3:04 PM  Result Value Ref Range   Sodium 135 135 - 145 mmol/L   Potassium 4.8 3.5 - 5.1 mmol/L   Chloride 100 (L) 101 - 111 mmol/L   CO2 26 22 - 32 mmol/L   Glucose, Bld 86 65 - 99 mg/dL   BUN 17 6 - 20 mg/dL   Creatinine, Ser 1.46 (H) 0.44 - 1.00 mg/dL   Calcium 9.2 8.9 - 10.3 mg/dL   Total Protein 7.5 6.5 - 8.1 g/dL   Albumin 3.7 3.5 - 5.0 g/dL   AST 35 15 - 41 U/L   ALT 15 14 - 54 U/L   Alkaline Phosphatase 57 38 - 126 U/L   Total Bilirubin 1.2 0.3 - 1.2 mg/dL   GFR calc non Af Amer 31 (L) >60 mL/min   GFR calc Af Amer 36 (L) >60 mL/min    Comment: (NOTE) The eGFR has been calculated using the CKD EPI equation. This calculation has not been validated in all clinical situations. eGFR's persistently <60 mL/min signify possible  Chronic Kidney Disease.    Anion gap 9 5 - 15  CBC     Status:  Abnormal   Collection Time: 04/25/17  3:04 PM  Result Value Ref Range   WBC 11.2 (H) 4.0 - 10.5 K/uL   RBC 4.23 3.87 - 5.11 MIL/uL   Hemoglobin 11.3 (L) 12.0 - 15.0 g/dL   HCT 34.2 (L) 36.0 - 46.0 %   MCV 80.9 78.0 - 100.0 fL   MCH 26.7 26.0 - 34.0 pg   MCHC 33.0 30.0 - 36.0 g/dL   RDW 17.4 (H) 11.5 - 15.5 %   Platelets 395 150 - 400 K/uL  Protime-INR     Status: Abnormal   Collection Time: 04/26/17  4:43 AM  Result Value Ref Range   Prothrombin Time 16.9 (H) 11.4 - 15.2 seconds   INR 1.37   APTT     Status: Abnormal   Collection Time: 04/26/17  4:43 AM  Result Value Ref Range   aPTT 41 (H) 24 - 36 seconds    Comment:        IF BASELINE aPTT IS ELEVATED, SUGGEST PATIENT RISK ASSESSMENT BE USED TO DETERMINE APPROPRIATE ANTICOAGULANT THERAPY.   Comprehensive metabolic panel     Status: Abnormal   Collection Time: 04/26/17  4:43 AM  Result Value Ref Range   Sodium 133 (L) 135 - 145 mmol/L   Potassium 4.3 3.5 - 5.1 mmol/L   Chloride 101 101 - 111 mmol/L   CO2 24 22 - 32 mmol/L   Glucose, Bld 94 65 - 99 mg/dL   BUN 15 6 - 20 mg/dL   Creatinine, Ser 1.20 (H) 0.44 - 1.00 mg/dL   Calcium 8.5 (L) 8.9 - 10.3 mg/dL   Total Protein 6.3 (L) 6.5 - 8.1 g/dL   Albumin 3.1 (L) 3.5 - 5.0 g/dL   AST 21 15 - 41 U/L   ALT 13 (L) 14 - 54 U/L   Alkaline Phosphatase 49 38 - 126 U/L   Total Bilirubin 0.7 0.3 - 1.2 mg/dL   GFR calc non Af Amer 40 (L) >60 mL/min   GFR calc Af Amer 46 (L) >60 mL/min    Comment: (NOTE) The eGFR has been calculated using the CKD EPI equation. This calculation has not been validated in all clinical situations. eGFR's persistently <60 mL/min signify possible Chronic Kidney Disease.    Anion gap 8 5 - 15  CBC     Status: Abnormal   Collection Time: 04/26/17  4:43 AM  Result Value Ref Range   WBC 12.1 (H) 4.0 - 10.5 K/uL   RBC 3.96 3.87 - 5.11 MIL/uL   Hemoglobin 10.4 (L) 12.0 - 15.0 g/dL    HCT 31.9 (L) 36.0 - 46.0 %   MCV 80.6 78.0 - 100.0 fL   MCH 26.3 26.0 - 34.0 pg   MCHC 32.6 30.0 - 36.0 g/dL   RDW 17.2 (H) 11.5 - 15.5 %   Platelets 335 150 - 400 K/uL   Ct Abdomen Pelvis Wo Contrast  Result Date: 04/25/2017 CLINICAL DATA:  Abdominal cramping over the last week. Emesis. Recent colon resection. EXAM: CT ABDOMEN AND PELVIS WITHOUT CONTRAST TECHNIQUE: Multidetector CT imaging of the abdomen and pelvis was performed following the standard protocol without IV contrast. COMPARISON:  04/19/2017 FINDINGS: Lower chest: No acute finding.  Hiatal hernia. Hepatobiliary: Small metastatic lesions within the liver demonstrated on the previous contrast-enhanced study cannot be visualized on today's noncontrast exam. These would presumably be unchanged. Small gallstones again visible dependent within the gallbladder. Small stone in the distal common bile duct. This could be symptomatic. Pancreas: Negative  Spleen: Negative Adrenals/Urinary Tract: Adrenal glands remain normal. Right kidney contains of small cyst in the lower pole. Small nonobstructing stone in the lower pole. No acute renal finding on the right. Left kidney again shows an upper pole cyst in shows hydroureteronephrosis with the ureter being dilated all the way to the pelvis. There is re- roll obstruction in the pelvis due to recurrent tumor. No primary bladder lesion is seen. Stomach/Bowel: There is recurrent tumor at the sigmoid colon anastomosis site which is locally extensive. This causes some degree of obstruction as well with relative constipation proximal to that. Vascular/Lymphatic: Aortic atherosclerosis is again demonstrated. Major veins are patent. Left-sided retroperitoneal adenopathy with low-density/necrosis as seen previously. Index node image 42 again measures 2.2 x 2.0 cm. Reproductive: No primary finding. Other: No free air. Musculoskeletal: Chronic spinal degenerative changes. IMPRESSION: Chololithiasis. Small stone also in  the common bile duct. This could possibly be symptomatic. Small liver metastases previously demonstrated cannot be seen without contrast. No change would be expected over the last 6 days. Persistent left hydronephrosis due to ureteral obstruction in the left pelvic retroperitoneum where there is extensive recurrent tumor at the sigmoid anastomosis with local extension. Some relative large bowel obstruction with a large amount of fecal matter proximal to this region. Metastatic retroperitoneal lymphadenopathy unchanged since the study of 6 days ago. Electronically Signed   By: Nelson Chimes M.D.   On: 04/25/2017 19:31    Anti-infectives    Start     Dose/Rate Route Frequency Ordered Stop   04/25/17 2100  cefTRIAXone (ROCEPHIN) 1 g in dextrose 5 % 50 mL IVPB     1 g 100 mL/hr over 30 Minutes Intravenous Every 24 hours 04/25/17 2052         Assessment/Plan Metastatic colon cancer to liver Large bowel obstruction - s/p open sigmoidectomy with repair of colovesicular fistula 01/14/17 by Dr. Marcello Moores - 1 week of progressive lower abdominal pain, n/v, anorexia, constipation - CT scan showed large bowel obstruction due to recurrent tumor at sigmoid anastomosis with large amount of fecal matter proximal to this region, left hydronephrosis, and likely small metastatic liver lesions - appreciate palliative input, planning to speak to patient again today  Renal insufficiency/Left hydronephrosis - per urology UTI - on rocephin Anemia DVT - on eliquis  ID - rocephin 7/22>> VTE - SCDs, eliquis FEN - IVF, NPO Code status - DNR  Plan - Recommend holding eliquis for possible procedure. Agree with palliative consult for goals of care. I will ask GI to see the patient to see if a stent would be an option. If stent is unable to be placed and patient would want to consider surgery, would discuss diverting colostomy.  Will continue to follow.  Jerrye Beavers, [facility] 04/26/2017, 9:05  AM Pager: [phone] Consults: [phone] Mon-Fri 7:00 am-4:30 pm Sat-Sun 7:00 am-11:30 am

## 2017-04-26 NOTE — Consult Note (Signed)
Consultation  Referring Provider:   Izora Gala, Pa-C   Primary Care Physician:  Shon Baton, MD Primary Gastroenterologist:   Dr. Sharlett Iles      Reason for Consultation: Large bowel obstruction            HPI:   Charlene [name] is an 83 y.o. Caucasian female with a past medical history as listed below including metastatic colon cancer to the liver status post open sigmoidectomy with repair of colovesicular fistula 01/14/17 by Dr. Marcello Moores, who recently re-presented to the ER on 04/25/17 with a large bowel obstruction. We were consulted today in regards to possible placement of colonic stent.   Today, the patient is laying comfortably in bed with her son by her bedside. She is slightly confused about details of her current hospitalization and her son provides much of her history. Apparently, ever since her surgery in April the patient has felt weaker and had a decreased appetite. Over the past week, she developed a lower abdominal pain and nausea with an episode of emesis yesterday during her hospitalization. Patient tells me that this is mostly lower pelvic pain which makes it hard to "sit in a chair". She has had no bowel movement in 7-8 days. She has continued to pass gas.  Patient also describes it has been more difficult to urinate, but she was able to go this morning she tells [place] from the assistance of her fluids. She also describes it has been harder for her to eat for the past few months.   Patient denies fever, chills, blood in her stool or melena.  Previous GI history: 01/14/17-open sigmoidectomy with repair of colovesicular fistula, Dr. Marcello Moores 01/11/17-flex sigmoidoscopy, Dr. Marcello Moores: Congested, erythematous and friable mucosa in the rectosigmoid colon; Pathology: Adenocarcinoma of the sigmoid colon grade 3, the tumor penetrates to the service of the visceral peritoneum, lymphovascular invasion is identified, stapled resection margins are negative for carcinoma, metastatic adenocarcinoma in  1/30 lymph nodes and diverticula with inflammation  01/21/05-colonoscopy, Dr. Sharlett Iles: Diverticulosis and internal hemorrhoids  Past Medical History:  Diagnosis Date  . Acute renal failure ([place])   . Allergy    seasonal  . BPPV (benign paroxysmal positional vertigo) 2012  . Colon cancer metastasized to liver ([place])   . Depression   . Diverticulitis   . Dyslipidemia   . GERD (gastroesophageal reflux disease)   . Hemorrhoids   . Hypertension 04/25/2017  . Macular degeneration   . Renal insufficiency 04/25/2017  . Urine frequency   . Uterine carcinoma [facility])     Past Surgical History:  Procedure Laterality Date  . ABDOMINAL HYSTERECTOMY    . COLECTOMY N/A 01/14/2017   Procedure: OPEN SIGMOIDECTOMY WITH REPAIR OF COLOVESICULAR FISTULA;  Surgeon: Leighton Ruff, MD;  Location: [facility] ORS;  Service: General;  Laterality: N/A;  . EYE SURGERY     cataract removal bilateral 2012  . FLEXIBLE SIGMOIDOSCOPY N/A 01/11/2017   Procedure: FLEXIBLE SIGMOIDOSCOPY;  Surgeon: Leighton Ruff, MD;  Location: [facility] ENDOSCOPY;  Service: Endoscopy;  Laterality: N/A;  . IR RADIOLOGIST EVAL & MGMT  02/11/2017  . TONSILLECTOMY      Family History  Problem Relation Age of Onset  . Heart disease Mother   . Diabetes Mother   . Stroke Father   . Diabetes Father   . Cancer Brother        lung cancer  . Heart disease Brother     Social History  Substance Use Topics  . Smoking status: Former Smoker  Quit date: 01/19/1973  . Smokeless tobacco: Never Used  . Alcohol use Yes     Comment: occ    Prior to Admission medications   Medication Sig Start Date End Date Taking? Authorizing Provider  apixaban (ELIQUIS) 2.5 MG TABS tablet Take 2.5 mg by mouth 2 (two) times daily.   Yes [provider]  Carboxymethylcellul-Glycerin (LUBRICATING EYE DROPS OP) Apply 1 drop to eye daily as needed (dry eyes).   Yes [provider]  docusate sodium (COLACE) 100 MG capsule Take 100 mg by mouth daily as needed  for mild constipation.   Yes [provider]  famotidine (PEPCID AC) 10 MG chewable tablet Chew 2 tablets (20 mg total) by mouth at bedtime as needed for heartburn. Patient taking differently: Chew 10 mg by mouth at bedtime as needed for heartburn.  01/20/17  Yes Earnstine Regal, PA-C  HYDROcodone-acetaminophen (NORCO/VICODIN) 5-325 MG tablet Take 1 tablet by mouth every 6 (six) hours as needed for moderate pain. 04/21/17  Yes Owens Shark, NP  Multiple Vitamins-Minerals (MULTIVITAMIN ADULT PO) Take 1 tablet by mouth daily.    Yes [provider]  Multiple Vitamins-Minerals (PRESERVISION AREDS 2) CAPS Take 1 capsule by mouth daily.   Yes [provider]  ondansetron (ZOFRAN) 4 MG tablet Take 4 mg by mouth every 6 (six) hours as needed for nausea or vomiting.   Yes [provider]  sennosides-docusate sodium (SENOKOT-S) 8.6-50 MG tablet Take 2 tablets by mouth daily.   Yes [provider]  protein supplement (RESOURCE BENEPROTEIN) 6 g POWD You can use whatever protein supplement that taste good to you.  Try to get 2-3 cans per day.  Do this till your ready to go home and take care of yourself. Patient not taking: Reported on 02/11/2017 01/20/17   Earnstine Regal, PA-C  traMADol (ULTRAM) 50 MG tablet Take 1-2 tablets (50-100 mg total) by mouth every 6 (six) hours as needed for moderate pain or severe pain. Patient not taking: Reported on 03/22/2017 01/20/17   Earnstine Regal, PA-C    Current Facility-Administered Medications  Medication Dose Route Frequency Provider Last Rate Last Dose  . 0.9 %  sodium chloride infusion   Intravenous Continuous Jani Gravel, MD 100 mL/hr at 04/26/17 [phone]    . acetaminophen (TYLENOL) tablet 650 mg  650 mg Oral Q6H PRN Jani Gravel, MD       Or  . acetaminophen (TYLENOL) suppository 650 mg  650 mg Rectal Q6H PRN Jani Gravel, MD      . cefTRIAXone (ROCEPHIN) 1 g in dextrose 5 % 50 mL IVPB  1 g Intravenous Q24H Jani Gravel, MD    Stopped at 04/25/17 2303  . docusate sodium (COLACE) capsule 100 mg  100 mg Oral Daily PRN Jani Gravel, MD   100 mg at 04/25/17 2233  . famotidine (PEPCID) tablet 10 mg  10 mg Oral QHS PRN Jani Gravel, MD      . hydrALAZINE (APRESOLINE) injection 10 mg  10 mg Intravenous Q4H PRN Rise Patience, MD      . hydroxypropyl methylcellulose / hypromellose (ISOPTO TEARS / GONIOVISC) 2.5 % ophthalmic solution 1 drop  1 drop Both Eyes Daily PRN Jani Gravel, MD      . morphine 4 MG/ML injection 1 mg  1 mg Intravenous Q4H PRN Jani Gravel, MD   1 mg at 04/25/17 2234  . multivitamin-lutein (OCUVITE-LUTEIN) capsule 1 capsule  1 capsule Oral Daily Jani Gravel, MD   1 capsule at  04/26/17 0929  . ondansetron (ZOFRAN) injection 4 mg  4 mg Intravenous Q6H PRN Jani Gravel, MD   4 mg at 04/26/17 0234  . ondansetron (ZOFRAN) tablet 4 mg  4 mg Oral Q6H PRN Jani Gravel, MD      . oxyCODONE (Oxy IR/ROXICODONE) immediate release tablet 5 mg  5 mg Oral Q6H PRN Rise Patience, MD   5 mg at 04/26/17 0929  . polyethylene glycol (MIRALAX / GLYCOLAX) packet 17 g  17 g Oral Daily PRN Jani Gravel, MD      . senna-docusate (Senokot-S) tablet 2 tablet  2 tablet Oral Daily Jani Gravel, MD   2 tablet at 04/26/17 [phone]  . sodium phosphate (FLEET) 7-19 GM/118ML enema 1 enema  1 enema Rectal Daily PRN Jani Gravel, MD        Allergies as of 04/25/2017  . (No Known Allergies)     Review of Systems:    Constitutional: No fever or chills Skin: No rash Cardiovascular: No palpitations Respiratory: No SOB Gastrointestinal: See HPI and otherwise negative Genitourinary: No dysuria  Neurological: No headache Musculoskeletal: No new muscle or joint pain Hematologic: No bleeding Psychiatric: No history of depression or anxiety   Physical Exam:  Vital signs in last 24 hours: Temp:  [97.5 F (36.4 C)-98.6 F (37 C)] 98.5 F (36.9 C) (07/23 0549) Pulse Rate:  [60-75] 69 (07/23 0549) Resp:  [16-18] 18 (07/23 0549) BP:  (125-207)/(50-137) 125/50 (07/23 0549) SpO2:  [94 %-96 %] 95 % (07/23 0549) Weight:  [125 lb 14.1 oz (57.1 kg)-127 lb 6.8 oz (57.8 kg)] 127 lb 6.8 oz (57.8 kg) (07/23 0550) Last BM Date: 04/18/17 General:   Pleasant elderly Caucasian female appears to be in NAD, Well developed, malnourished with signs of temporal wasting, alert and cooperative Head:  Normocephalic and atraumatic. Eyes:   PEERL, EOMI. No icterus. Conjunctiva pink. Ears:  Normal auditory acuity. Neck:  Supple Throat: Oral cavity and pharynx without inflammation, swelling or lesion.  Lungs: Respirations even and unlabored. Lungs clear to auscultation bilaterally.   No wheezes, crackles, or rhonchi.  Heart: Normal S1, S2. No MRG. Regular rate and rhythm. No peripheral edema, cyanosis or pallor.  Abdomen:  Soft, mild distension, moderate ttp in suprapubic region, tympanic to percussion, tympanic bowel sounds Rectal:  Not performed.  Msk:  Symmetrical without gross deformities. Peripheral pulses intact.   Extremities: Without edema, no deformity or joint abnormality. Normal ROM, normal sensation. Neurologic: Alert and  oriented x4;  grossly normal neurologically.  Skin: Dry and intact without significant lesions or rashes. Psychiatric: Demonstrates good judgement and reason without abnormal affect or behaviors.  LAB RESULTS:  Recent Labs  04/25/17 1504 04/26/17 0443  WBC 11.2* 12.1*  HGB 11.3* 10.4*  HCT 34.2* 31.9*  PLT 395 335   BMET  Recent Labs  04/25/17 1504 04/26/17 0443  NA 135 133*  K 4.8 4.3  CL 100* 101  CO2 26 24  GLUCOSE 86 94  BUN 17 15  CREATININE 1.46* 1.20*  CALCIUM 9.2 8.5*   LFT  Recent Labs  04/26/17 0443  PROT 6.3*  ALBUMIN 3.1*  AST 21  ALT 13*  ALKPHOS 49  BILITOT 0.7   PT/INR  Recent Labs  04/26/17 0443  LABPROT 16.9*  INR 1.37    STUDIES: Ct Abdomen Pelvis Wo Contrast  Result Date: 04/25/2017 CLINICAL DATA:  Abdominal cramping over the last week. Emesis. Recent  colon resection. EXAM: CT ABDOMEN AND PELVIS WITHOUT CONTRAST TECHNIQUE: Multidetector CT  imaging of the abdomen and pelvis was performed following the standard protocol without IV contrast. COMPARISON:  04/19/2017 FINDINGS: Lower chest: No acute finding.  Hiatal hernia. Hepatobiliary: Small metastatic lesions within the liver demonstrated on the previous contrast-enhanced study cannot be visualized on today's noncontrast exam. These would presumably be unchanged. Small gallstones again visible dependent within the gallbladder. Small stone in the distal common bile duct. This could be symptomatic. Pancreas: Negative Spleen: Negative Adrenals/Urinary Tract: Adrenal glands remain normal. Right kidney contains of small cyst in the lower pole. Small nonobstructing stone in the lower pole. No acute renal finding on the right. Left kidney again shows an upper pole cyst in shows hydroureteronephrosis with the ureter being dilated all the way to the pelvis. There is re- roll obstruction in the pelvis due to recurrent tumor. No primary bladder lesion is seen. Stomach/Bowel: There is recurrent tumor at the sigmoid colon anastomosis site which is locally extensive. This causes some degree of obstruction as well with relative constipation proximal to that. Vascular/Lymphatic: Aortic atherosclerosis is again demonstrated. Major veins are patent. Left-sided retroperitoneal adenopathy with low-density/necrosis as seen previously. Index node image 42 again measures 2.2 x 2.0 cm. Reproductive: No primary finding. Other: No free air. Musculoskeletal: Chronic spinal degenerative changes. IMPRESSION: Chololithiasis. Small stone also in the common bile duct. This could possibly be symptomatic. Small liver metastases previously demonstrated cannot be seen without contrast. No change would be expected over the last 6 days. Persistent left hydronephrosis due to ureteral obstruction in the left pelvic retroperitoneum where there is  extensive recurrent tumor at the sigmoid anastomosis with local extension. Some relative large bowel obstruction with a large amount of fecal matter proximal to this region. Metastatic retroperitoneal lymphadenopathy unchanged since the study of 6 days ago. Electronically Signed   By: Nelson Chimes M.D.   On: 04/25/2017 19:31   PREVIOUS ENDOSCOPIES:            See HPI   Impression / Plan:   Impression: 1. Metastatic Colon Cancer to Liver: Status post sigmoidectomy with repair of colovesicular fistula 01/14/17, biopsies at that time showed 1 out of 30 lymph nodes positive with adenocarcinoma, Ct shows signs of spread to the liver, patient in with signs and symptoms of bowel obstruction related to above, long discussion with patient today in regards to possible colonic stent, this is not feasible due to tumor length and tortuosity of the colon, it was recommended that she revisit possible diverting colostomy 2. Large bowel obstruction: with above  Plan: 1. Ordered two 1/2 L tap water enemas with manual fecal disimpaction by nursing to follow 2. Will leave diet to surgical team 3. Dr. Loletha Carrow plans to re-discuss diverting colostomy with Dr. Marcello Moores as colonic stent is not feasible in this patient. This plan was discussed in detail with the patient and her son today and they verbalize understanding. Ample time was given for discussion and questions. 4. Please see Dr. Corena Pilgrim additional notes below. We will likely sign off today.  Thank you for your kind consultation, we will continue to follow.  Charlene [name] [facility]  04/26/2017, 10:51 AM Pager #: [phone]

## 2017-04-26 NOTE — Progress Notes (Signed)
Palliative care progress note  Reason for consult: Goals of care in light on adenocarcinoma; pain management  I met today with Charlene [name].  We discussed conversation with GI and surgery with recommendation for diverting loop colostomy.  She reports that she is not excited about another surgical procedure, but she has been miserable and wants to pursue colostomy with hope she will feel better and avoid further complications (such as concern for perforation).  We discussed her pain and she reports that current dose of medication (oxy 61m) has been sufficient.  I will increase frequency to Q4hours in case she has worsening of pain, however, she has been avoiding it unless absolutely necessary as she is already constipated.  While I am going off service, a member of [facility] will continue to follow and support Charlene [name] and her family moving forward.  Total time: 40 minutes Greater than 50%  of this time was spent counseling and coordinating care related to the above assessment and plan.  GMicheline Rough MD CRiver RoadTeam [phone]

## 2017-04-27 DIAGNOSIS — R970 Elevated carcinoembryonic antigen [CEA]: Secondary | ICD-10-CM

## 2017-04-27 DIAGNOSIS — C787 Secondary malignant neoplasm of liver and intrahepatic bile duct: Secondary | ICD-10-CM

## 2017-04-27 DIAGNOSIS — R109 Unspecified abdominal pain: Secondary | ICD-10-CM

## 2017-04-27 DIAGNOSIS — Z515 Encounter for palliative care: Secondary | ICD-10-CM

## 2017-04-27 DIAGNOSIS — C7989 Secondary malignant neoplasm of other specified sites: Secondary | ICD-10-CM

## 2017-04-27 DIAGNOSIS — N1330 Unspecified hydronephrosis: Secondary | ICD-10-CM

## 2017-04-27 DIAGNOSIS — Z7189 Other specified counseling: Secondary | ICD-10-CM

## 2017-04-27 DIAGNOSIS — C187 Malignant neoplasm of sigmoid colon: Secondary | ICD-10-CM

## 2017-04-27 LAB — BASIC METABOLIC PANEL
ANION GAP: 11 (ref 5–15)
BUN: 18 mg/dL (ref 6–20)
CALCIUM: 8 mg/dL — AB (ref 8.9–10.3)
CO2: 20 mmol/L — ABNORMAL LOW (ref 22–32)
CREATININE: 1.28 mg/dL — AB (ref 0.44–1.00)
Chloride: 104 mmol/L (ref 101–111)
GFR, EST AFRICAN AMERICAN: 43 mL/min — AB (ref >60)
GFR, EST NON AFRICAN AMERICAN: 37 mL/min — AB (ref >60)
Glucose, Bld: 51 mg/dL — ABNORMAL LOW (ref 65–99)
Potassium: 4.1 mmol/L (ref 3.5–5.1)
SODIUM: 135 mmol/L (ref 135–145)

## 2017-04-27 LAB — CBC WITH DIFFERENTIAL/PLATELET
BASOS ABS: 0 10*3/uL (ref 0.0–0.1)
BASOS PCT: 0 %
EOS ABS: 0.3 10*3/uL (ref 0.0–0.7)
EOS PCT: 3 %
HEMATOCRIT: 30 % — AB (ref 36.0–46.0)
Hemoglobin: 9.6 g/dL — ABNORMAL LOW (ref 12.0–15.0)
Lymphocytes Relative: 9 %
Lymphs Abs: 0.9 10*3/uL (ref 0.7–4.0)
MCH: 26.2 pg (ref 26.0–34.0)
MCHC: 32 g/dL (ref 30.0–36.0)
MCV: 81.7 fL (ref 78.0–100.0)
MONO ABS: 0.9 10*3/uL (ref 0.1–1.0)
Monocytes Relative: 9 %
NEUTROS ABS: 8 10*3/uL — AB (ref 1.7–7.7)
Neutrophils Relative %: 79 %
Platelets: 318 10*3/uL (ref 150–400)
RBC: 3.67 MIL/uL — ABNORMAL LOW (ref 3.87–5.11)
RDW: 17.2 % — AB (ref 11.5–15.5)
WBC: 10 10*3/uL (ref 4.0–10.5)

## 2017-04-27 LAB — URINE CULTURE

## 2017-04-27 LAB — SURGICAL PCR SCREEN
MRSA, PCR: NEGATIVE
STAPHYLOCOCCUS AUREUS: NEGATIVE

## 2017-04-27 LAB — HEPARIN LEVEL (UNFRACTIONATED)
HEPARIN UNFRACTIONATED: 1.46 [IU]/mL — AB (ref 0.30–0.70)
HEPARIN UNFRACTIONATED: 1.5 [IU]/mL — AB (ref 0.30–0.70)

## 2017-04-27 LAB — APTT
APTT: 43 s — AB (ref 24–36)
aPTT: 127 seconds — ABNORMAL HIGH (ref 24–36)

## 2017-04-27 MED ORDER — CHLORHEXIDINE GLUCONATE CLOTH 2 % EX PADS
6.0000 | MEDICATED_PAD | Freq: Once | CUTANEOUS | Status: AC
  Administered 2017-04-27: 6 via TOPICAL

## 2017-04-27 MED ORDER — CEFOTETAN DISODIUM-DEXTROSE 2-2.08 GM-% IV SOLR
2.0000 g | INTRAVENOUS | Status: AC
  Administered 2017-04-28: 2 g via INTRAVENOUS

## 2017-04-27 MED ORDER — CHLORHEXIDINE GLUCONATE CLOTH 2 % EX PADS
6.0000 | MEDICATED_PAD | Freq: Once | CUTANEOUS | Status: AC
  Administered 2017-04-28: 6 via TOPICAL

## 2017-04-27 MED ORDER — HYDROCORTISONE 2.5 % RE CREA
TOPICAL_CREAM | Freq: Four times a day (QID) | RECTAL | Status: DC
  Administered 2017-04-27: 11:00:00 via RECTAL
  Administered 2017-04-28: 1 via RECTAL
  Administered 2017-04-28 – 2017-05-01 (×7): via RECTAL
  Administered 2017-05-01: 1 via RECTAL
  Administered 2017-05-02 – 2017-05-08 (×21): via RECTAL
  Filled 2017-04-27: qty 28.35

## 2017-04-27 MED ORDER — HEPARIN (PORCINE) IN NACL 100-0.45 UNIT/ML-% IJ SOLN
850.0000 [IU]/h | INTRAMUSCULAR | Status: AC
  Filled 2017-04-27: qty 250

## 2017-04-27 MED ORDER — HEPARIN (PORCINE) IN NACL 100-0.45 UNIT/ML-% IJ SOLN
950.0000 [IU]/h | INTRAMUSCULAR | Status: DC
  Administered 2017-04-27: 950 [IU]/h via INTRAVENOUS
  Filled 2017-04-27: qty 250

## 2017-04-27 MED ORDER — CEFOTETAN DISODIUM 2 G IJ SOLR: 2.0000 g | INTRAMUSCULAR | Status: DC

## 2017-04-27 NOTE — Consult Note (Signed)
[place] Nurse ostomy consult note  [place] Nurse requested for preoperative stoma site marking by Dr. Excell Seltzer.  Discussed surgical procedure and stoma creation with patient and family member (daughter, Gregary Signs).  Explained role of the [place] nurse team. Answered patient and family questions. Primary concerns are odor and difficulty in acquiring required skill set; reassurance regarding the modern-day pouching supplies is provided.  Patient's concerns acknowledged.  They appreciate education today and are provided with my contact information should they (or the patient's son, Simona Huh) have additional questions regarding the ostomy prior to surgery.  Examined patient lying, sitting, and standing in order to place the marking in the patient's visual field, away from any creases or abdominal contour issues and within the rectus muscle.  There are numerous creases in the LLQ.  A LUQ site is selected for optimal management.  Marked for colostomy in the LUQ  5cm to the left of the umbilicus and 6.7EL above the umbilicus.  Patient's abdomen cleansed with CHG wipes at site markings, allowed to air dry prior to marking.Covered mark with thin film transparent dressing to preserve mark until date of surgery.   [place] Nurse team will remain available after surgery for continued ostomy care and teaching. Please place an order for consultation following operative procedure.  Thank you for inviting [place] to meet this nice patient and her daughter and to mark her preoperatively.   Maudie Flakes, MSN, RN, [place], [facility]  Pager# [phone]

## 2017-04-27 NOTE — Progress Notes (Signed)
IP PROGRESS NOTE  Subjective:   Charlene [name] had a persistently elevated CEA following the colon cancer resection. She was referred for staging CTs on 04/19/2017 and confirmed to have recurrent disease in the pelvis causing obstruction of the left ureter and narrowing of the colon. There are new liver metastases and retroperitoneal adenopathy.  She reports pelvic pressure and constipation. She presented to the emergency room yesterday. A repeat CT of the abdomen/pelvis revealed persistent left hydronephrosis with tumor obstructing sigmoid colon.  Dr. Excell Seltzer was consulted. He recommends a diverting colostomy. She was seen by urology. No intervention was recommended at present.  Objective: Vital signs in last 24 hours: Blood pressure (!) 150/58, pulse 66, temperature 98.6 F (37 C), temperature source Oral, resp. rate 18, height 5' 4"  (1.626 m), weight 129 lb 1.6 oz (58.6 kg), SpO2 93 %.  Intake/Output from previous day: 07/23 0701 - 07/24 0700 In: 1425 [I.V.:1425] Out: 300 [Urine:300]  Physical Exam:  Lungs: Clear bilaterally Cardiac: Regular rate and rhythm Abdomen: No hepatomegaly, no mass, mild tenderness in the right lower abdomen Extremities: No leg edema   Portacath/PICC-without erythema  Lab Results:  Recent Labs  04/26/17 0443 04/27/17 0421  WBC 12.1* 10.0  HGB 10.4* 9.6*  HCT 31.9* 30.0*  PLT 335 318    BMET  Recent Labs  04/26/17 0443 04/27/17 0421  NA 133* 135  K 4.3 4.1  CL 101 104  CO2 24 20*  GLUCOSE 94 51*  BUN 15 18  CREATININE 1.20* 1.28*  CALCIUM 8.5* 8.0*    Studies/Results: Ct Abdomen Pelvis Wo Contrast  Result Date: 04/25/2017 CLINICAL DATA:  Abdominal cramping over the last week. Emesis. Recent colon resection. EXAM: CT ABDOMEN AND PELVIS WITHOUT CONTRAST TECHNIQUE: Multidetector CT imaging of the abdomen and pelvis was performed following the standard protocol without IV contrast. COMPARISON:  04/19/2017 FINDINGS: Lower chest: No  acute finding.  Hiatal hernia. Hepatobiliary: Small metastatic lesions within the liver demonstrated on the previous contrast-enhanced study cannot be visualized on today's noncontrast exam. These would presumably be unchanged. Small gallstones again visible dependent within the gallbladder. Small stone in the distal common bile duct. This could be symptomatic. Pancreas: Negative Spleen: Negative Adrenals/Urinary Tract: Adrenal glands remain normal. Right kidney contains of small cyst in the lower pole. Small nonobstructing stone in the lower pole. No acute renal finding on the right. Left kidney again shows an upper pole cyst in shows hydroureteronephrosis with the ureter being dilated all the way to the pelvis. There is re- roll obstruction in the pelvis due to recurrent tumor. No primary bladder lesion is seen. Stomach/Bowel: There is recurrent tumor at the sigmoid colon anastomosis site which is locally extensive. This causes some degree of obstruction as well with relative constipation proximal to that. Vascular/Lymphatic: Aortic atherosclerosis is again demonstrated. Major veins are patent. Left-sided retroperitoneal adenopathy with low-density/necrosis as seen previously. Index node image 42 again measures 2.2 x 2.0 cm. Reproductive: No primary finding. Other: No free air. Musculoskeletal: Chronic spinal degenerative changes. IMPRESSION: Chololithiasis. Small stone also in the common bile duct. This could possibly be symptomatic. Small liver metastases previously demonstrated cannot be seen without contrast. No change would be expected over the last 6 days. Persistent left hydronephrosis due to ureteral obstruction in the left pelvic retroperitoneum where there is extensive recurrent tumor at the sigmoid anastomosis with local extension. Some relative large bowel obstruction with a large amount of fecal matter proximal to this region. Metastatic retroperitoneal lymphadenopathy unchanged since the study  of 6  days ago. Electronically Signed   By: Nelson Chimes M.D.   On: 04/25/2017 19:31   CT images reviewed  Medications: I have reviewed the patient's current medications.  Assessment/Plan:  1. Colon cancer, sigmoid, stage IIIB (T4,N1), status post a sigmoid colectomy 01/14/2017 ? MSI-stable, no loss of mismatch repair protein expression ? Positive circumferential resection margin ? Colovesical fistula repaired at the time of surgery ? CT abdomen/pelvis 12/22/2016 with distal sigmoid/proximal rectal mass extending to the apex of the vaginal vault and posterior bladder, no evidence of metastatic disease ? Persistently elevated postoperative CEA ? CT 04/19/2017-new liver metastases, retroperitoneal adenopathy, and recurrent tumor in the pelvis with left ureter obstruction and narrowing of the sigmoid colon 2. History of uterine cancer  3.   Diverticulitis  4.   Right lower extremity DVT-on apixaban  5.   Postoperative abscess, status post percutaneous drain, May 2018  6.   Admission 04/25/2017 with sigmoid colon obstruction  7.   Renal insufficiency secondary to obstructive left hydronephrosis   Charlene [name] has been diagnosed with progressive metastatic colon cancer. She is admitted with lobe dominant pain and constipation secondary to recurrent pelvic tumor causing obstruction of the sigmoid colon. She is scheduled for a diverting colostomy tomorrow. She understands no therapy will be curative.  We discussed hospice care versus a trial of systemic therapy. I will return for further discussion with her family over the next several days. We could consider single agent capecitabine or FOLFOX chemotherapy pending her recovery from surgery. We would need to address the hydronephrosis prior to considering chemotherapy.  I will continue following her in the hospital and we will arrange for outpatient follow-up at the [facility].      LOS: 1 day   Donneta Romberg, MD   04/27/2017, 7:25  AM

## 2017-04-27 NOTE — Progress Notes (Signed)
Patient ID: Charlene [name], female   DOB: 06/14/1928, 83 y.o.   MRN: [id number]  [facility] Surgery Progress Note     Subjective: CC- large bowel obstruction Patient lying comfortably in bed, daughter at bedside. Spoke with Dr. Benay Spice earlier this morning. Overall feeling well. Denies any current abdominal pain, nausea, or vomiting. Had small BM with enema yesterday. Passing some flatus. Planning for diverting loop colostomy.  Objective: Vital signs in last 24 hours: Temp:  [97.7 F (36.5 C)-98.6 F (37 C)] 98.6 F (37 C) (07/24 0610) Pulse Rate:  [59-66] 66 (07/24 0610) Resp:  [18] 18 (07/24 0610) BP: (143-150)/(50-58) 150/58 (07/24 0610) SpO2:  [92 %-93 %] 93 % (07/24 0610) Weight:  [129 lb 1.6 oz (58.6 kg)] 129 lb 1.6 oz (58.6 kg) (07/24 0610) Last BM Date: 04/26/17  Intake/Output from previous day: 07/23 0701 - 07/24 0700 In: 1425 [I.V.:1425] Out: 300 [Urine:300] Intake/Output this shift: No intake/output data recorded.  PE: Gen:  Alert, NAD, pleasant HEENT: EOM's intact, pupils equal  Card:  RRR, no M/G/R heard Pulm:  CTAB, no W/R/R, effort normal Abd: Soft, mild distension, +BS, no HSM, no hernia, well healed midline incision C/D/I, mild lower abdominal tenderness Ext:  No erythema, edema, or tenderness BUE/BLE  Psych: A&Ox3  Skin: no rashes noted, warm and dry  Lab Results:   Recent Labs  04/26/17 0443 04/27/17 0421  WBC 12.1* 10.0  HGB 10.4* 9.6*  HCT 31.9* 30.0*  PLT 335 318   BMET  Recent Labs  04/26/17 0443 04/27/17 0421  NA 133* 135  K 4.3 4.1  CL 101 104  CO2 24 20*  GLUCOSE 94 51*  BUN 15 18  CREATININE 1.20* 1.28*  CALCIUM 8.5* 8.0*   PT/INR  Recent Labs  04/26/17 0443  LABPROT 16.9*  INR 1.37   CMP     Component Value Date/Time   NA 135 04/27/2017 0421   NA 136 04/19/2017 1044   K 4.1 04/27/2017 0421   K 4.5 04/19/2017 1044   CL 104 04/27/2017 0421   CO2 20 (L) 04/27/2017 0421   CO2 23 04/19/2017 1044   GLUCOSE 51 (L) 04/27/2017 0421   GLUCOSE 96 04/19/2017 1044   BUN 18 04/27/2017 0421   BUN 19.9 04/19/2017 1044   CREATININE 1.28 (H) 04/27/2017 0421   CREATININE 1.4 (H) 04/19/2017 1044   CALCIUM 8.0 (L) 04/27/2017 0421   CALCIUM 9.7 04/19/2017 1044   PROT 6.3 (L) 04/26/2017 0443   ALBUMIN 3.1 (L) 04/26/2017 0443   AST 21 04/26/2017 0443   ALT 13 (L) 04/26/2017 0443   ALKPHOS 49 04/26/2017 0443   BILITOT 0.7 04/26/2017 0443   GFRNONAA 37 (L) 04/27/2017 0421   GFRAA 43 (L) 04/27/2017 0421   Lipase     Component Value Date/Time   LIPASE 24 04/25/2017 1504       Studies/Results: Ct Abdomen Pelvis Wo Contrast  Result Date: 04/25/2017 CLINICAL DATA:  Abdominal cramping over the last week. Emesis. Recent colon resection. EXAM: CT ABDOMEN AND PELVIS WITHOUT CONTRAST TECHNIQUE: Multidetector CT imaging of the abdomen and pelvis was performed following the standard protocol without IV contrast. COMPARISON:  04/19/2017 FINDINGS: Lower chest: No acute finding.  Hiatal hernia. Hepatobiliary: Small metastatic lesions within the liver demonstrated on the previous contrast-enhanced study cannot be visualized on today's noncontrast exam. These would presumably be unchanged. Small gallstones again visible dependent within the gallbladder. Small stone in the distal common bile duct. This could be symptomatic. Pancreas: Negative  Spleen: Negative Adrenals/Urinary Tract: Adrenal glands remain normal. Right kidney contains of small cyst in the lower pole. Small nonobstructing stone in the lower pole. No acute renal finding on the right. Left kidney again shows an upper pole cyst in shows hydroureteronephrosis with the ureter being dilated all the way to the pelvis. There is re- roll obstruction in the pelvis due to recurrent tumor. No primary bladder lesion is seen. Stomach/Bowel: There is recurrent tumor at the sigmoid colon anastomosis site which is locally extensive. This causes some degree of obstruction  as well with relative constipation proximal to that. Vascular/Lymphatic: Aortic atherosclerosis is again demonstrated. Major veins are patent. Left-sided retroperitoneal adenopathy with low-density/necrosis as seen previously. Index node image 42 again measures 2.2 x 2.0 cm. Reproductive: No primary finding. Other: No free air. Musculoskeletal: Chronic spinal degenerative changes. IMPRESSION: Chololithiasis. Small stone also in the common bile duct. This could possibly be symptomatic. Small liver metastases previously demonstrated cannot be seen without contrast. No change would be expected over the last 6 days. Persistent left hydronephrosis due to ureteral obstruction in the left pelvic retroperitoneum where there is extensive recurrent tumor at the sigmoid anastomosis with local extension. Some relative large bowel obstruction with a large amount of fecal matter proximal to this region. Metastatic retroperitoneal lymphadenopathy unchanged since the study of 6 days ago. Electronically Signed   By: Nelson Chimes M.D.   On: 04/25/2017 19:31    Anti-infectives: Anti-infectives    Start     Dose/Rate Route Frequency Ordered Stop   04/25/17 2100  cefTRIAXone (ROCEPHIN) 1 g in dextrose 5 % 50 mL IVPB     1 g 100 mL/hr over 30 Minutes Intravenous Every 24 hours 04/25/17 2052         Assessment/Plan Metastatic colon cancer to liver Large bowel obstruction - s/p open sigmoidectomy with repair of colovesicular fistula 01/14/17 by Dr. Marcello Moores - 1 week of progressive lower abdominal pain, n/v, anorexia, constipation - CT scan showed large bowel obstruction due to recurrent tumor at sigmoid anastomosis with large amount of fecal matter proximal to this region, left hydronephrosis, and likely small metastatic liver lesions - not a candidate for stent placement - planning for diverting loop colostomy once patient has been off Eliquis adequate amount of time. I will consult WOC today, and discuss timing of OR  with MD's.  Renal insufficiency/Left hydronephrosis - per urology UTI - Ucx growing multiple species. on rocephin Anemia DVT - on eliquis at home (last dose 930AM 7/23)  ID - rocephin 7/22>>day#3. WBC WNL today VTE - SCDs. Ok to start heparin while off eliquis from our standpoint (will just need to be stopped morning of surgery) FEN - IVF, NPO Code status - DNR   LOS: 1 day    Jerrye Beavers , [facility] Surgery 04/27/2017, 8:09 AM Pager: [phone] Consults: [phone] Mon-Fri 7:00 am-4:30 pm Sat-Sun 7:00 am-11:30 am

## 2017-04-27 NOTE — Progress Notes (Signed)
Tap water enema done. Small BM in toilet.

## 2017-04-27 NOTE — Progress Notes (Signed)
ANTICOAGULATION CONSULT NOTE    Pharmacy Consult for heparin Indication: DVT - bridge while off apixaban for surgery  No Known Allergies  Patient Measurements: Height: 5\' 4"  (162.6 cm) Weight: 129 lb 1.6 oz (58.6 kg) IBW/kg (Calculated) : 54.7 Heparin Dosing Weight: 59kg  Vital Signs: Temp: 98.9 F (37.2 C) (07/24 2100) Temp Source: Oral (07/24 2100) BP: 198/75 (07/24 2100) Pulse Rate: 58 (07/24 2100)  Labs:  Recent Labs  04/25/17 1504 04/26/17 0443 04/27/17 0421 04/27/17 1357 04/27/17 1359 04/27/17 2029  HGB 11.3* 10.4* 9.6*  --   --   --   HCT 34.2* 31.9* 30.0*  --   --   --   PLT 395 335 318  --   --   --   APTT  --  41*  --   --  43* 127*  LABPROT  --  16.9*  --   --   --   --   INR  --  1.37  --   --   --   --   HEPARINUNFRC  --   --   --  1.46*  --  1.50*  CREATININE 1.46* 1.20* 1.28*  --   --   --     Estimated Creatinine Clearance: 27.2 mL/min (A) (by C-G formula based on SCr of 1.28 mg/dL (H)).   Medical History: Past Medical History:  Diagnosis Date  . Acute renal failure ([place])   . Allergy    seasonal  . BPPV (benign paroxysmal positional vertigo) 2012  . Colon cancer metastasized to liver ([place])   . Depression   . Diverticulitis   . Dyslipidemia   . GERD (gastroesophageal reflux disease)   . Hemorrhoids   . Hypertension 04/25/2017  . Macular degeneration   . Renal insufficiency 04/25/2017  . Urine frequency   . Uterine carcinoma (HCC)     Assessment: 69 YOF with a large bowel obstruction with a history of metastatic colon cancer.  She has a history of DVT on apixaban prior to admission.  Although the manufacturer does not recommend a dosage adjustment for renal function, she was on apixaban 2.5mg   BID.  Patient going to OR 7/25.   Last dose of apixaban was 7/22 at 0930  Today, 04/27/2017  Baseline aPTT = 41 sec,  HL = 1.46,  INR = 1.37  CBC: Hgb dec, pltc WNL  2nd shift update  APTT = 127 sec (slightly supratherapeutic) with heparin  infusing @ 950 units/hr.  Heparin level still falsely elevated at 1.5 due to effects of apixaban  No complications of therapy noted  Goal of Therapy:  Heparin level 0.3-0.7 aPTT 66-102 seconds Monitor platelets by anticoagulation protocol: Yes   Plan:   Decrease heparin infusion to 850 units/hr  Check aPTT and heparin level 8 hr after rate decrease  Heparin is to be d/c'ed at 0700 on 7/24 per MD order  Leone Haven, PharmD 04/27/2017 9:50 PM

## 2017-04-27 NOTE — Progress Notes (Addendum)
PROGRESS NOTE    Charlene [name]  ONG:[id number] DOB: [birth year] DOA: 04/25/2017 PCP: Shon Baton, MD     Brief Narrative:  Charlene [name] is a 83 yo female with PMHx of metastatic colon cancer to liver who presents with lower abdominal pain, lack of bowel movement in > 1 week, nausea. CT abdomen and pelvis was completed emergency department, which showed small liver metastases, persistent left hydronephrosis due to ureteral obstruction in the left pelvic retroperitoneum, large bowel obstruction with a large amount of fecal matter proximal to this region. Urology, general surgery, gastroenterology has been consulted. Patient is scheduled for diverting loop colostomy.   Assessment & Plan:   Principal Problem:   Abdominal pain Active Problems:   UTI (urinary tract infection)   Renal insufficiency   Anemia   Hypertension   Large bowel obstruction (HCC)  Large bowel obstruction  -GI and general surgery has been consulted. Per GI, colonic stent is not feasible due to tumor length and tortuosity of colon. Recommending diverting colostomy. Possibly on schedule for 7/25.  -Enema and digital disimpaction. Encouraged patient to tolerate as best as possible to relieve fecal impaction   Acute kidney injury secondary to obstruction, with left hydronephrosis -Baseline Cr 0.63  -Urology consulted, no emergent indication for intervention at this point. Could consider left perc nephrostomy tube by IR   -Trend BMP and avoid nephrotoxins. Cr stable currently. -Monitor I/O   Pyuria -Urine culture with contamination. Stop rocephin.   Metastatic colon cancer to the liver s/p open sigmoidectomy with repair of colovesicular fistula  -Patient follows with Dr. Benay Spice -Palliative care also following   Anemia of chronic disease -Hgb stable, monitor   DVT -Holding eliquis for now pending surgical intervention. Start heparin gtt, will need to hold 6 hours prior to surgery    DVT prophylaxis: Holding  eliquis for now, pending surgical intervention. SCD. Heparin gtt.  Code Status: DNR Family Communication: At bedside Disposition Plan: Pending consulting services plan, surgical intervention planned 7/25   Consultants:   Oncology Dr. Benay Spice  General Surgery  Urology  GI  Procedures:   None  Antimicrobials:  Anti-infectives    Start     Dose/Rate Route Frequency Ordered Stop   04/25/17 2100  cefTRIAXone (ROCEPHIN) 1 g in dextrose 5 % 50 mL IVPB  Status:  Discontinued     1 g 100 mL/hr over 30 Minutes Intravenous Every 24 hours 04/25/17 2052 04/27/17 1220       Subjective: She is in good spirits this morning. She is passing gas, had a very small bowel movement with enema yesterday. She admits to soreness on her bottom due to internal hemorrhoids. She really does not want another enema, but we discussed the importance of disimpaction and she agrees to try again today. No further abdominal pain. She is asking for water, no nausea or vomiting.  Objective: Vitals:   04/26/17 0550 04/26/17 1300 04/26/17 2029 04/27/17 0610  BP:  (!) 149/51 (!) 143/50 (!) 150/58  Pulse:  64 (!) 59 66  Resp:  18 18 18   Temp:  98.4 F (36.9 C) 97.7 F (36.5 C) 98.6 F (37 C)  TempSrc:  Oral Oral Oral  SpO2:  92% 93% 93%  Weight: 57.8 kg (127 lb 6.8 oz)   58.6 kg (129 lb 1.6 oz)  Height:        Intake/Output Summary (Last 24 hours) at 04/27/17 1223 Last data filed at 04/27/17 0920  Gross per 24 hour  Intake  1425 ml  Output              100 ml  Net             1325 ml   Filed Weights   04/25/17 2145 04/26/17 0550 04/27/17 0610  Weight: 57.1 kg (125 lb 14.1 oz) 57.8 kg (127 lb 6.8 oz) 58.6 kg (129 lb 1.6 oz)    Examination:  General exam: Appears calm and comfortable  Respiratory system: Clear to auscultation. Respiratory effort normal. Cardiovascular system: S1 & S2 heard, RRR. No JVD, murmurs, rubs, gallops or clicks. No pedal edema. Gastrointestinal system: Abdomen  is nondistended, soft, not tender to palpation. No organomegaly or masses felt. +bowel sounds heard. Soft  Central nervous system: Alert and oriented. No focal neurological deficits. Extremities: Symmetric 5 x 5 power. Skin: No rashes, lesions or ulcers Psychiatry: Judgement and insight appear normal. Mood & affect appropriate.   Data Reviewed: I have personally reviewed following labs and imaging studies  CBC:  Recent Labs Lab 04/25/17 1504 04/26/17 0443 04/27/17 0421  WBC 11.2* 12.1* 10.0  NEUTROABS  --   --  8.0*  HGB 11.3* 10.4* 9.6*  HCT 34.2* 31.9* 30.0*  MCV 80.9 80.6 81.7  PLT 395 335 097   Basic Metabolic Panel:  Recent Labs Lab 04/25/17 1504 04/26/17 0443 04/27/17 0421  NA 135 133* 135  K 4.8 4.3 4.1  CL 100* 101 104  CO2 26 24 20*  GLUCOSE 86 94 51*  BUN 17 15 18   CREATININE 1.46* 1.20* 1.28*  CALCIUM 9.2 8.5* 8.0*   GFR: Estimated Creatinine Clearance: 27.2 mL/min (A) (by C-G formula based on SCr of 1.28 mg/dL (H)). Liver Function Tests:  Recent Labs Lab 04/25/17 1504 04/26/17 0443  AST 35 21  ALT 15 13*  ALKPHOS 57 49  BILITOT 1.2 0.7  PROT 7.5 6.3*  ALBUMIN 3.7 3.1*    Recent Labs Lab 04/25/17 1504  LIPASE 24   No results for input(s): AMMONIA in the last 168 hours. Coagulation Profile:  Recent Labs Lab 04/26/17 0443  INR 1.37   Cardiac Enzymes: No results for input(s): CKTOTAL, CKMB, CKMBINDEX, TROPONINI in the last 168 hours. BNP (last 3 results) No results for input(s): PROBNP in the last 8760 hours. HbA1C: No results for input(s): HGBA1C in the last 72 hours. CBG: No results for input(s): GLUCAP in the last 168 hours. Lipid Profile: No results for input(s): CHOL, HDL, LDLCALC, TRIG, CHOLHDL, LDLDIRECT in the last 72 hours. Thyroid Function Tests: No results for input(s): TSH, T4TOTAL, FREET4, T3FREE, THYROIDAB in the last 72 hours. Anemia Panel: No results for input(s): VITAMINB12, FOLATE, FERRITIN, TIBC, IRON,  RETICCTPCT in the last 72 hours. Sepsis Labs: No results for input(s): PROCALCITON, LATICACIDVEN in the last 168 hours.  Recent Results (from the past 240 hour(s))  Urine culture     Status: Abnormal   Collection Time: 04/25/17 10:46 PM  Result Value Ref Range Status   Specimen Description URINE, CLEAN CATCH  Final   Special Requests NONE  Final   Culture MULTIPLE SPECIES PRESENT, SUGGEST RECOLLECTION (A)  Final   Report Status 04/27/2017 FINAL  Final       Radiology Studies: Ct Abdomen Pelvis Wo Contrast  Result Date: 04/25/2017 CLINICAL DATA:  Abdominal cramping over the last week. Emesis. Recent colon resection. EXAM: CT ABDOMEN AND PELVIS WITHOUT CONTRAST TECHNIQUE: Multidetector CT imaging of the abdomen and pelvis was performed following the standard protocol without IV contrast. COMPARISON:  04/19/2017  FINDINGS: Lower chest: No acute finding.  Hiatal hernia. Hepatobiliary: Small metastatic lesions within the liver demonstrated on the previous contrast-enhanced study cannot be visualized on today's noncontrast exam. These would presumably be unchanged. Small gallstones again visible dependent within the gallbladder. Small stone in the distal common bile duct. This could be symptomatic. Pancreas: Negative Spleen: Negative Adrenals/Urinary Tract: Adrenal glands remain normal. Right kidney contains of small cyst in the lower pole. Small nonobstructing stone in the lower pole. No acute renal finding on the right. Left kidney again shows an upper pole cyst in shows hydroureteronephrosis with the ureter being dilated all the way to the pelvis. There is re- roll obstruction in the pelvis due to recurrent tumor. No primary bladder lesion is seen. Stomach/Bowel: There is recurrent tumor at the sigmoid colon anastomosis site which is locally extensive. This causes some degree of obstruction as well with relative constipation proximal to that. Vascular/Lymphatic: Aortic atherosclerosis is again  demonstrated. Major veins are patent. Left-sided retroperitoneal adenopathy with low-density/necrosis as seen previously. Index node image 42 again measures 2.2 x 2.0 cm. Reproductive: No primary finding. Other: No free air. Musculoskeletal: Chronic spinal degenerative changes. IMPRESSION: Chololithiasis. Small stone also in the common bile duct. This could possibly be symptomatic. Small liver metastases previously demonstrated cannot be seen without contrast. No change would be expected over the last 6 days. Persistent left hydronephrosis due to ureteral obstruction in the left pelvic retroperitoneum where there is extensive recurrent tumor at the sigmoid anastomosis with local extension. Some relative large bowel obstruction with a large amount of fecal matter proximal to this region. Metastatic retroperitoneal lymphadenopathy unchanged since the study of 6 days ago. Electronically Signed   By: Nelson Chimes M.D.   On: 04/25/2017 19:31      Scheduled Meds: . hydrocortisone   Rectal QID  . multivitamin-lutein  1 capsule Oral Daily  . senna-docusate  2 tablet Oral Daily   Continuous Infusions: . sodium chloride 100 mL/hr at 04/27/17 0434     LOS: 1 day    Time spent: 30 minutes   Dessa Phi, DO [facility] [email or web address] Password [facility] 04/27/2017, 12:23 PM

## 2017-04-27 NOTE — Progress Notes (Signed)
Daily Progress Note   Patient Name: Charlene [name]       Date: 04/27/2017 DOB: Aug 07, 1928  Age: 83 y.o. MRN#: [id number] Attending Physician: Baird Lyons* Primary Care Physician: Shon Baton, MD Admit Date: 04/25/2017  Reason for Consultation/Follow-up: symptom management, following disease trajectory to help guide decision making  Subjective:  patient is awake alert She was unable to tolerate manual disimpaction/enema on 04-26-17 Her pain is well controlled No nausea Son at bedside See below:   Length of Stay: 1  Current Medications: Scheduled Meds:  . [START ON 04/28/2017] cefoTEtan in Dextrose 5%  2 g Intravenous On Call to OR  . Chlorhexidine Gluconate Cloth  6 each Topical Once   And  . [START ON 04/28/2017] Chlorhexidine Gluconate Cloth  6 each Topical Once  . hydrocortisone   Rectal QID  . multivitamin-lutein  1 capsule Oral Daily  . senna-docusate  2 tablet Oral Daily    Continuous Infusions: . sodium chloride 100 mL/hr at 04/27/17 1336  . heparin 950 Units/hr (04/27/17 1418)    PRN Meds: acetaminophen **OR** acetaminophen, docusate sodium, famotidine, hydrALAZINE, hydroxypropyl methylcellulose / hypromellose, morphine injection, ondansetron (ZOFRAN) IV, ondansetron, oxyCODONE, polyethylene glycol, sodium phosphate  Physical Exam         NAD Thin elderly lady Clear S1 S2 Abdomen not distended +BS, passing gas No edema Awake alert oriented  Vital Signs: BP (!) 150/58 (BP Location: Right Arm)   Pulse 66   Temp 98.6 F (37 C) (Oral)   Resp 18   Ht 5\' 4"  (1.626 m)   Wt 58.6 kg (129 lb 1.6 oz)   SpO2 93%   BMI 22.16 kg/m  SpO2: SpO2: 93 % O2 Device: O2 Device: Not Delivered O2 Flow Rate:    Intake/output summary:  Intake/Output Summary (Last  24 hours) at 04/27/17 1543 Last data filed at 04/27/17 0920  Gross per 24 hour  Intake             1425 ml  Output              100 ml  Net             1325 ml   LBM: Last BM Date: 04/26/17 Baseline Weight: Weight: 57.1 kg (125 lb 14.1 oz) Most recent weight: Weight: 58.6 kg (129 lb  1.6 oz)       Palliative Assessment/Data:    Flowsheet Rows     Most Recent Value  Intake Tab  Referral Department  -- [ER]  Unit at Time of Referral  ER  Palliative Care Primary Diagnosis  Cancer  Date Notified  04/25/17  Palliative Care Type  New Palliative care  Reason for referral  Pain, Clarify Goals of Care  Date of Admission  04/25/17  Date first seen by Palliative Care  04/25/17  # of days Palliative referral response time  0 Day(s)  # of days IP prior to Palliative referral  0  Clinical Assessment  Palliative Performance Scale Score  50%  Pain Max last 24 hours  9  Pain Min Last 24 hours  4  Psychosocial & Spiritual Assessment  Palliative Care Outcomes  Patient/Family meeting held?  Yes  Who was at the meeting?  Patient, son, daughter in law      Patient Active Problem List   Diagnosis Date Noted  . Malignant neoplasm of sigmoid colon ([place])   . Large bowel obstruction ([place]) 04/26/2017  . Abdominal pain 04/25/2017  . UTI (urinary tract infection) 04/25/2017  . Renal insufficiency 04/25/2017  . Anemia 04/25/2017  . Hypertension 04/25/2017  . Pelvic abscess in female 02/02/2017  . Colovesical fistula with cancer s/p colectomy/repair 01/14/2017 01/18/2017  . 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 Protein-calorie malnutrition, severe 02/01/2016  . Depression 01/31/2016  . GERD (gastroesophageal reflux disease) 01/31/2016  . Hemorrhoids 01/31/2016  . Acute diverticulitis 05/05/2015  . Abnormal LFTs 05/05/2015  . Dyslipidemia 03/13/2015  . Leukocytosis 03/13/2015  . Acute renal failure ([place]) 03/13/2015    Palliative Care  Assessment & Plan   Patient Profile:    Assessment:  metastatic colon cancer Constipation Abdominal pain Large bowel obstruction  Recommendations/Plan:   plan for diverting colostomy soon  Med onc and surgery following  Agree with hydrocortisone cream  Agree with current pain and non pain symptom management regimen.    Code Status:    Code Status Orders        Start     Ordered   04/25/17 2150  Do not attempt resuscitation (DNR)  Continuous    Question Answer Comment  In the event of cardiac or respiratory ARREST Do not call a "code blue"   In the event of cardiac or respiratory ARREST Do not perform Intubation, CPR, defibrillation or ACLS   In the event of cardiac or respiratory ARREST Use medication by any route, position, wound care, and other measures to relive pain and suffering. May use oxygen, suction and manual treatment of airway obstruction as needed for comfort.      04/25/17 2149    Code Status History    Date Active Date Inactive Code Status Order ID Comments User Context   02/03/2017  2:36 AM 02/07/2017  8:41 PM DNR [id number]  Nila Nephew, RN Inpatient   02/02/2017  7:22 PM 02/03/2017  2:36 AM Full Code [id number]  Armandina Gemma, MD ED   01/11/2017  9:26 AM 01/21/2017  5:14 PM Full Code [id number]  Leighton Ruff, MD Inpatient   01/31/2016  2:09 AM 02/02/2016  3:30 PM Full Code [id number]  Vianne Bulls, MD Inpatient   05/05/2015  9:12 PM 05/08/2015  1:15 PM Full Code [id number]  Reubin Milan, MD Inpatient   03/13/2015  5:20 PM 03/15/2015  5:34 PM Full Code [id number]  Robbie Lis, MD Inpatient  Prognosis:   Unable to determine  Discharge Planning:  To Be Determined  Care plan was discussed with  Patient, son, Dr Maylene Roes.   Thank you for allowing the Palliative Medicine Team to assist in the care of this patient.   Time In: 9 Time Out: 9.35 Total Time 35 Prolonged Time Billed  no       Greater than 50%  of this time was spent counseling and  coordinating care related to the above assessment and plan.  Loistine Chance, MD [phone]  Please contact Palliative Medicine Team phone at [phone] for questions and concerns.

## 2017-04-27 NOTE — Care Management Note (Signed)
Case Management Note  Patient Details  Name: MELLONIE GUESS MRN: [id number] Date of Birth: 06-10-1928  Subjective/Objective: 83 y/o f admitted w/Abd pain. From home. Will order PT cons post surgery.CSW already following for SNF.                   Action/Plan:d/c plan SNF.   Expected Discharge Date:  04/27/17               Expected Discharge Plan:  Skilled Nursing Facility  In-House Referral:  Clinical Social Work  Discharge planning Services  CM Consult  Post Acute Care Choice:    Choice offered to:     DME Arranged:    DME Agency:     HH Arranged:    [place] Agency:     Status of Service:  In process, will continue to follow  If discussed at Long Length of Stay Meetings, dates discussed:    Additional Comments:  Dessa Phi, RN 04/27/2017, 12:43 PM

## 2017-04-27 NOTE — Progress Notes (Signed)
ANTICOAGULATION CONSULT NOTE - Initial Consult  Pharmacy Consult for heparin Indication: DVT - bridge while off apixaban for surgery  No Known Allergies  Patient Measurements: Height: 5\' 4"  (162.6 cm) Weight: 129 lb 1.6 oz (58.6 kg) IBW/kg (Calculated) : 54.7 Heparin Dosing Weight: 59kg  Vital Signs: Temp: 98.6 F (37 C) (07/24 0610) Temp Source: Oral (07/24 0610) BP: 150/58 (07/24 0610) Pulse Rate: 66 (07/24 0610)  Labs:  Recent Labs  04/25/17 1504 04/26/17 0443 04/27/17 0421  HGB 11.3* 10.4* 9.6*  HCT 34.2* 31.9* 30.0*  PLT 395 335 318  APTT  --  41*  --   LABPROT  --  16.9*  --   INR  --  1.37  --   CREATININE 1.46* 1.20* 1.28*    Estimated Creatinine Clearance: 27.2 mL/min (A) (by C-G formula based on SCr of 1.28 mg/dL (H)).   Medical History: Past Medical History:  Diagnosis Date  . Acute renal failure ([place])   . Allergy    seasonal  . BPPV (benign paroxysmal positional vertigo) 2012  . Colon cancer metastasized to liver ([place])   . Depression   . Diverticulitis   . Dyslipidemia   . GERD (gastroesophageal reflux disease)   . Hemorrhoids   . Hypertension 04/25/2017  . Macular degeneration   . Renal insufficiency 04/25/2017  . Urine frequency   . Uterine carcinoma ([facility])     Assessment: 85 YOF with a large bowel obstruction with a history of metastatic colon cancer.  She has a history of DVT on apixaban prior to admission.  Although the manufacturer does not recommend a dosage adjustment for renal function, she was on apixaban 2.5mg   BID.  Patient going to OR 7/25.   Last dose of apixaban was 7/22 at 0930  Today, 04/27/2017  Baseline aPTT = 41 sec,  INR = 1.37  CBC: Hgb dec, pltc WNL  Goal of Therapy:  Heparin level 0.3-0.7 aPTT 66-102 seconds Monitor platelets by anticoagulation protocol: Yes   Plan:   Once baseline labs collected start, Heparin (no bolus) 950 units/hr  Stop heparin 6h prior to OR as per surgery and TRH. Per OR schedule,  surgery at 12:42 7/25  Check aPTT and/or heparin level based on baseline lab results  Doreene Eland, PharmD, BCPS.   Pager: [phone] 04/27/2017 12:43 PM

## 2017-04-27 NOTE — Clinical Social Work Note (Signed)
Clinical Social Work Assessment  Patient Details  Name: Charlene [name] MRN: [id number] Date of Birth: 10/25/1927  Date of referral:  04/27/17               Reason for consult:  Facility Placement                Permission sought to share information with:  Chartered certified accountant granted to share information::  Yes, Verbal Permission Granted  Name::        Agency::     Relationship::     Contact Information:     Housing/Transportation Living arrangements for the past 2 months:  Single Family Home Source of Information:  Patient, Adult Children Patient Interpreter Needed:  None Criminal Activity/Legal Involvement Pertinent to Current Situation/Hospitalization:  No - Comment as needed Significant Relationships:  Adult Children Lives with:  Self Do you feel safe going back to the place where you live?    Need for family participation in patient care:  Yes (Comment)  Care giving concerns:  Patient from home requesting SNF at discharge. Patient scheduled to have surgery within the next few days for ostomy.    Social Worker assessment / plan:  CSW spoke with patient/patient's daughter at bedside regarding consult for SNF placement. Patient reported that she is interested in SNF at discharge however is unaware whether she will need ST rehab or LTC after surgery. CSW provided patient/patient's daughter with information about local SNFs and explained the difference between ST rehab vs LTC. Patient reported that if LTC is needed she will private pay for SNF. [name] reported that they will know more after the surgery, noting they are also meeting with palliative. [name] requested that CSW check in tomorrow 7/25 to learn more about plans moving forward. CSW will follow up and assist with discharge planning to SNF.   Employment status:  Retired Forensic scientist:  Medicare PT Recommendations:  Not assessed at this time Information / Referral to  community resources:  [place]  Patient/Family's Response to care:  Patient/patient's daughter appreciative of CSW assistance with discharge planning to SNF. Patient prefers SNF at discharge.   Patient/Family's Understanding of and Emotional Response to Diagnosis, Current Treatment, and Prognosis:  Patient/patient's daughter verbalized understanding of patient's diagnosis and are awaiting surgery to learn more about prognosis. [name] active in patient's care and providing support.   Emotional Assessment Appearance:  Appears younger than stated age Attitude/Demeanor/Rapport:  Other (Open) Affect (typically observed):  Pleasant Orientation:  Oriented to Self, Oriented to Place, Oriented to  Time, Oriented to Situation Alcohol / Substance use:  Not Applicable Psych involvement (Current and /or in the community):  No (Comment)  Discharge Needs  Concerns to be addressed:  No discharge needs identified Readmission within the last 30 days:  No Current discharge risk:  None Barriers to Discharge:  Continued Medical Work up   [employer], LCSW 04/27/2017, 12:04 PM

## 2017-04-28 ENCOUNTER — Ambulatory Visit: Payer: Medicare Other | Admitting: Oncology

## 2017-04-28 ENCOUNTER — Encounter (HOSPITAL_COMMUNITY): Payer: Self-pay

## 2017-04-28 ENCOUNTER — Inpatient Hospital Stay (HOSPITAL_COMMUNITY): Payer: Medicare Other

## 2017-04-28 ENCOUNTER — Inpatient Hospital Stay (HOSPITAL_COMMUNITY): Payer: Medicare Other | Admitting: Certified Registered Nurse Anesthetist

## 2017-04-28 ENCOUNTER — Encounter (HOSPITAL_COMMUNITY)
Admission: EM | Disposition: A | Discharge status: Skilled Nursing Facility | Payer: Self-pay | Source: Home / Self Care | Attending: Internal Medicine

## 2017-04-28 DIAGNOSIS — R8281 Pyuria: Secondary | ICD-10-CM

## 2017-04-28 DIAGNOSIS — I82409 Acute embolism and thrombosis of unspecified deep veins of unspecified lower extremity: Secondary | ICD-10-CM

## 2017-04-28 DIAGNOSIS — N1330 Unspecified hydronephrosis: Secondary | ICD-10-CM

## 2017-04-28 DIAGNOSIS — N390 Urinary tract infection, site not specified: Secondary | ICD-10-CM

## 2017-04-28 HISTORY — PX: CYSTOSCOPY W/ URETERAL STENT PLACEMENT: SHX1429

## 2017-04-28 HISTORY — PX: ILEO LOOP DIVERSION: SHX1780

## 2017-04-28 LAB — BASIC METABOLIC PANEL
ANION GAP: 9 (ref 5–15)
BUN: 15 mg/dL (ref 6–20)
CO2: 20 mmol/L — ABNORMAL LOW (ref 22–32)
Calcium: 8.1 mg/dL — ABNORMAL LOW (ref 8.9–10.3)
Chloride: 105 mmol/L (ref 101–111)
Creatinine, Ser: 1.16 mg/dL — ABNORMAL HIGH (ref 0.44–1.00)
GFR, EST AFRICAN AMERICAN: 48 mL/min — AB (ref >60)
GFR, EST NON AFRICAN AMERICAN: 41 mL/min — AB (ref >60)
Glucose, Bld: 59 mg/dL — ABNORMAL LOW (ref 65–99)
POTASSIUM: 4 mmol/L (ref 3.5–5.1)
SODIUM: 134 mmol/L — AB (ref 135–145)

## 2017-04-28 LAB — MAGNESIUM: MAGNESIUM: 1.8 mg/dL (ref 1.7–2.4)

## 2017-04-28 LAB — TYPE AND SCREEN
ABO/RH(D): B POS
ANTIBODY SCREEN: NEGATIVE

## 2017-04-28 SURGERY — CREATION, ILEOSTOMY, LOOP
Anesthesia: General | Site: Abdomen

## 2017-04-28 MED ORDER — SUGAMMADEX SODIUM 200 MG/2ML IV SOLN
INTRAVENOUS | Status: AC
  Filled 2017-04-28: qty 2

## 2017-04-28 MED ORDER — MEPERIDINE HCL 50 MG/ML IJ SOLN: 6.2500 mg | INTRAMUSCULAR | Status: DC | PRN

## 2017-04-28 MED ORDER — CLONIDINE HCL 0.1 MG/24HR TD PTWK
0.1000 mg | MEDICATED_PATCH | TRANSDERMAL | Status: DC
  Filled 2017-04-28: qty 1

## 2017-04-28 MED ORDER — ROCURONIUM BROMIDE 50 MG/5ML IV SOSY
PREFILLED_SYRINGE | INTRAVENOUS | Status: AC
  Filled 2017-04-28: qty 5

## 2017-04-28 MED ORDER — ONDANSETRON HCL 4 MG/2ML IJ SOLN: 4.0000 mg | Freq: Once | INTRAMUSCULAR | Status: DC | PRN

## 2017-04-28 MED ORDER — HEPARIN (PORCINE) IN NACL 100-0.45 UNIT/ML-% IJ SOLN
700.0000 [IU]/h | INTRAMUSCULAR | Status: DC
  Administered 2017-04-28 – 2017-04-29 (×2): 850 [IU]/h via INTRAVENOUS
  Administered 2017-04-30: 700 [IU]/h via INTRAVENOUS
  Filled 2017-04-28 (×2): qty 250

## 2017-04-28 MED ORDER — 0.9 % SODIUM CHLORIDE (POUR BTL) OPTIME
TOPICAL | Status: DC | PRN
  Administered 2017-04-28: 4000 mL

## 2017-04-28 MED ORDER — IOHEXOL 300 MG/ML  SOLN
INTRAMUSCULAR | Status: DC | PRN
  Administered 2017-04-28: 3 mL

## 2017-04-28 MED ORDER — BUPIVACAINE-EPINEPHRINE (PF) 0.25% -1:200000 IJ SOLN
INTRAMUSCULAR | Status: AC
  Filled 2017-04-28: qty 30

## 2017-04-28 MED ORDER — FENTANYL CITRATE (PF) 100 MCG/2ML IJ SOLN: 25.0000 ug | INTRAMUSCULAR | Status: DC | PRN

## 2017-04-28 MED ORDER — FENTANYL CITRATE (PF) 100 MCG/2ML IJ SOLN
INTRAMUSCULAR | Status: AC
  Filled 2017-04-28: qty 2

## 2017-04-28 MED ORDER — LABETALOL HCL 5 MG/ML IV SOLN
5.0000 mg | INTRAVENOUS | Status: DC | PRN
  Administered 2017-04-28 (×2): 5 mg via INTRAVENOUS

## 2017-04-28 MED ORDER — LABETALOL HCL 5 MG/ML IV SOLN
INTRAVENOUS | Status: AC
  Administered 2017-04-28: 5 mg via INTRAVENOUS
  Filled 2017-04-28: qty 4

## 2017-04-28 MED ORDER — FENTANYL CITRATE (PF) 100 MCG/2ML IJ SOLN
INTRAMUSCULAR | Status: DC | PRN
  Administered 2017-04-28 (×4): 25 ug via INTRAVENOUS

## 2017-04-28 MED ORDER — LACTATED RINGERS IV SOLN: INTRAVENOUS | Status: DC

## 2017-04-28 MED ORDER — PROPOFOL 10 MG/ML IV BOLUS
INTRAVENOUS | Status: AC
  Filled 2017-04-28: qty 20

## 2017-04-28 MED ORDER — SUGAMMADEX SODIUM 200 MG/2ML IV SOLN
INTRAVENOUS | Status: DC | PRN
  Administered 2017-04-28: 150 mg via INTRAVENOUS

## 2017-04-28 MED ORDER — ONDANSETRON HCL 4 MG/2ML IJ SOLN
INTRAMUSCULAR | Status: AC
  Filled 2017-04-28: qty 2

## 2017-04-28 MED ORDER — DEXAMETHASONE SODIUM PHOSPHATE 10 MG/ML IJ SOLN
INTRAMUSCULAR | Status: DC | PRN
  Administered 2017-04-28: 10 mg via INTRAVENOUS

## 2017-04-28 MED ORDER — DEXAMETHASONE SODIUM PHOSPHATE 10 MG/ML IJ SOLN
INTRAMUSCULAR | Status: AC
  Filled 2017-04-28: qty 1

## 2017-04-28 MED ORDER — LIDOCAINE 2% (20 MG/ML) 5 ML SYRINGE
INTRAMUSCULAR | Status: DC | PRN
  Administered 2017-04-28: 60 mg via INTRAVENOUS

## 2017-04-28 MED ORDER — BUPIVACAINE-EPINEPHRINE 0.25% -1:200000 IJ SOLN
INTRAMUSCULAR | Status: DC | PRN
  Administered 2017-04-28: 12 mL

## 2017-04-28 MED ORDER — ROCURONIUM BROMIDE 50 MG/5ML IV SOSY
PREFILLED_SYRINGE | INTRAVENOUS | Status: DC | PRN
  Administered 2017-04-28: 30 mg via INTRAVENOUS
  Administered 2017-04-28: 20 mg via INTRAVENOUS

## 2017-04-28 MED ORDER — PROPOFOL 10 MG/ML IV BOLUS
INTRAVENOUS | Status: DC | PRN
  Administered 2017-04-28: 100 mg via INTRAVENOUS

## 2017-04-28 MED ORDER — LACTATED RINGERS IV SOLN
INTRAVENOUS | Status: DC | PRN
  Administered 2017-04-28: 14:00:00 via INTRAVENOUS

## 2017-04-28 MED ORDER — SUCCINYLCHOLINE CHLORIDE 200 MG/10ML IV SOSY
PREFILLED_SYRINGE | INTRAVENOUS | Status: DC | PRN
  Administered 2017-04-28: 100 mg via INTRAVENOUS

## 2017-04-28 MED ORDER — LABETALOL HCL 5 MG/ML IV SOLN
INTRAVENOUS | Status: AC
  Filled 2017-04-28: qty 4

## 2017-04-28 MED ORDER — FENTANYL CITRATE (PF) 100 MCG/2ML IJ SOLN
25.0000 ug | INTRAMUSCULAR | Status: DC | PRN
  Administered 2017-04-28: 50 ug via INTRAVENOUS
  Administered 2017-04-28 (×2): 25 ug via INTRAVENOUS

## 2017-04-28 MED ORDER — LIDOCAINE 2% (20 MG/ML) 5 ML SYRINGE
INTRAMUSCULAR | Status: AC
  Filled 2017-04-28: qty 5

## 2017-04-28 MED ORDER — CEFOTETAN DISODIUM-DEXTROSE 2-2.08 GM-% IV SOLR
INTRAVENOUS | Status: AC
  Filled 2017-04-28: qty 50

## 2017-04-28 MED ORDER — METHYLENE BLUE 0.5 % INJ SOLN
INTRAVENOUS | Status: DC | PRN
  Administered 2017-04-28: 5 mL via INTRAVENOUS

## 2017-04-28 MED ORDER — ONDANSETRON HCL 4 MG/2ML IJ SOLN
INTRAMUSCULAR | Status: DC | PRN
  Administered 2017-04-28: 4 mg via INTRAVENOUS

## 2017-04-28 MED ORDER — METHYLENE BLUE 0.5 % INJ SOLN
INTRAVENOUS | Status: AC
  Filled 2017-04-28: qty 10

## 2017-04-28 MED ORDER — LABETALOL HCL 5 MG/ML IV SOLN
INTRAVENOUS | Status: DC | PRN
  Administered 2017-04-28: 5 mg via INTRAVENOUS

## 2017-04-28 MED ORDER — SUCCINYLCHOLINE CHLORIDE 200 MG/10ML IV SOSY
PREFILLED_SYRINGE | INTRAVENOUS | Status: AC
  Filled 2017-04-28: qty 10

## 2017-04-28 MED ORDER — FENTANYL CITRATE (PF) 100 MCG/2ML IJ SOLN
INTRAMUSCULAR | Status: AC
  Administered 2017-04-28: 25 ug via INTRAVENOUS
  Filled 2017-04-28: qty 2

## 2017-04-28 SURGICAL SUPPLY — 88 items
ADH SKN CLS APL DERMABOND .7 (GAUZE/BANDAGES/DRESSINGS) ×2
APPLIER CLIP 5 13 M/L LIGAMAX5 (MISCELLANEOUS)
APPLIER CLIP ROT 10 11.4 M/L (STAPLE)
APR CLP MED LRG 11.4X10 (STAPLE)
APR CLP MED LRG 5 ANG JAW (MISCELLANEOUS)
BAG URO CATCHER STRL LF (MISCELLANEOUS) ×4 IMPLANT
BLADE EXTENDED COATED 6.5IN (ELECTRODE) IMPLANT
BLADE HEX COATED 2.75 (ELECTRODE) ×4 IMPLANT
BLADE SURG SZ10 CARB STEEL (BLADE) IMPLANT
CATH INTERMIT  6FR 70CM (CATHETERS) ×4 IMPLANT
CELLS DAT CNTRL 66122 CELL SVR (MISCELLANEOUS) IMPLANT
CLIP APPLIE 5 13 M/L LIGAMAX5 (MISCELLANEOUS) IMPLANT
CLIP APPLIE ROT 10 11.4 M/L (STAPLE) IMPLANT
CLOSURE WOUND 1/2 X4 (GAUZE/BANDAGES/DRESSINGS)
CLOTH BEACON ORANGE TIMEOUT ST (SAFETY) ×4 IMPLANT
CONNECTOR 5 IN 1 STRAIGHT STRL (MISCELLANEOUS) IMPLANT
COVER MAYO STAND STRL (DRAPES) ×4 IMPLANT
COVER SURGICAL LIGHT HANDLE (MISCELLANEOUS) ×8 IMPLANT
DECANTER SPIKE VIAL GLASS SM (MISCELLANEOUS) ×4 IMPLANT
DERMABOND ADVANCED (GAUZE/BANDAGES/DRESSINGS) ×2
DERMABOND ADVANCED .7 DNX12 (GAUZE/BANDAGES/DRESSINGS) ×2 IMPLANT
DEVICE TROCAR PUNCTURE CLOSURE (ENDOMECHANICALS) IMPLANT
DRAPE LAPAROSCOPIC ABDOMINAL (DRAPES) ×4 IMPLANT
DRAPE SHEET LG 3/4 BI-LAMINATE (DRAPES) IMPLANT
DRAPE WARM FLUID 44X44 (DRAPE) ×4 IMPLANT
ELECT PENCIL ROCKER SW 15FT (MISCELLANEOUS) ×4 IMPLANT
ELECT REM PT RETURN 15FT ADLT (MISCELLANEOUS) ×4 IMPLANT
ENSEAL DEVICE STD TIP 35CM (ENDOMECHANICALS) IMPLANT
GAUZE SPONGE 4X4 12PLY STRL (GAUZE/BANDAGES/DRESSINGS) ×4 IMPLANT
GLOVE BIOGEL M STRL SZ7.5 (GLOVE) ×4 IMPLANT
GLOVE BIOGEL PI IND STRL 7.0 (GLOVE) ×2 IMPLANT
GLOVE BIOGEL PI INDICATOR 7.0 (GLOVE) ×2
GLOVE SURG SIGNA 7.5 PF LTX (GLOVE) ×4 IMPLANT
GOWN STRL REUS W/TWL LRG LVL3 (GOWN DISPOSABLE) ×12 IMPLANT
GOWN STRL REUS W/TWL XL LVL3 (GOWN DISPOSABLE) ×8 IMPLANT
GUIDEWIRE ANG ZIPWIRE 038X150 (WIRE) ×4 IMPLANT
GUIDEWIRE STR DUAL SENSOR (WIRE) ×4 IMPLANT
HANDLE SUCTION POOLE (INSTRUMENTS) ×2 IMPLANT
KIT BASIN OR (CUSTOM PROCEDURE TRAY) ×4 IMPLANT
LEGGING LITHOTOMY PAIR STRL (DRAPES) IMPLANT
LOOP OSTOMY BRIDGE (OSTOMY) ×4 IMPLANT
MANIFOLD NEPTUNE II (INSTRUMENTS) ×4 IMPLANT
PACK CYSTO (CUSTOM PROCEDURE TRAY) ×4 IMPLANT
PAD POSITIONING PINK XL (MISCELLANEOUS) IMPLANT
POSITIONER SURGICAL ARM (MISCELLANEOUS) IMPLANT
RTRCTR WOUND ALEXIS 18CM MED (MISCELLANEOUS)
SCISSORS LAP 5X35 DISP (ENDOMECHANICALS) IMPLANT
SEALER TISSUE X1 CVD JAW (INSTRUMENTS) ×4 IMPLANT
SHEARS HARMONIC ACE PLUS 36CM (ENDOMECHANICALS) ×4 IMPLANT
SOLUTION ANTI FOG 6CC (MISCELLANEOUS) ×4 IMPLANT
SPONGE LAP 18X18 X RAY DECT (DISPOSABLE) ×8 IMPLANT
STAPLER VISISTAT 35W (STAPLE) ×4 IMPLANT
STENT URET 6FRX24 CONTOUR (STENTS) ×4 IMPLANT
STRIP CLOSURE SKIN 1/2X4 (GAUZE/BANDAGES/DRESSINGS) IMPLANT
SUCTION POOLE HANDLE (INSTRUMENTS) ×4
SUT ETHILON 3 0 PS 1 (SUTURE) ×4 IMPLANT
SUT MNCRL AB 4-0 PS2 18 (SUTURE) ×4 IMPLANT
SUT PDS AB 1 CT1 27 (SUTURE) IMPLANT
SUT PDS AB 1 CTX 36 (SUTURE) IMPLANT
SUT PDS AB 4-0 SH 27 (SUTURE) IMPLANT
SUT PROLENE 2 0 KS (SUTURE) IMPLANT
SUT SILK 2 0 (SUTURE) ×4
SUT SILK 2 0 SH CR/8 (SUTURE) ×4 IMPLANT
SUT SILK 2-0 18XBRD TIE 12 (SUTURE) ×2 IMPLANT
SUT SILK 3 0 (SUTURE) ×4
SUT SILK 3 0 SH CR/8 (SUTURE) ×4 IMPLANT
SUT SILK 3-0 18XBRD TIE 12 (SUTURE) ×2 IMPLANT
SUT VIC AB 2-0 CT1 27 (SUTURE)
SUT VIC AB 2-0 CT1 27XBRD (SUTURE) IMPLANT
SUT VIC AB 3-0 PS2 18 (SUTURE)
SUT VIC AB 3-0 PS2 18XBRD (SUTURE) IMPLANT
SUT VIC AB 3-0 SH 18 (SUTURE) ×4 IMPLANT
SUT VIC AB 4-0 SH 18 (SUTURE) IMPLANT
SUT VICRYL 0 UR6 27IN ABS (SUTURE) IMPLANT
SYR 30ML LL (SYRINGE) ×4 IMPLANT
SYR BULB IRRIGATION 50ML (SYRINGE) ×4 IMPLANT
TAPE CLOTH 4X10 WHT NS (GAUZE/BANDAGES/DRESSINGS) IMPLANT
TOWEL OR 17X26 10 PK STRL BLUE (TOWEL DISPOSABLE) ×4 IMPLANT
TRAY FOLEY CATH 14FR (SET/KITS/TRAYS/PACK) ×4 IMPLANT
TRAY LAPAROSCOPIC (CUSTOM PROCEDURE TRAY) ×4 IMPLANT
TROCAR BLADELESS OPT 5 100 (ENDOMECHANICALS) IMPLANT
TROCAR XCEL NON-BLD 11X100MML (ENDOMECHANICALS) IMPLANT
TROCAR XCEL UNIV SLVE 11M 100M (ENDOMECHANICALS) IMPLANT
TUBING CONNECTING 10 (TUBING) ×3 IMPLANT
TUBING CONNECTING 10' (TUBING) ×1
TUBING INSUF HEATED (TUBING) ×4 IMPLANT
YANKAUER SUCT BULB TIP 10FT TU (MISCELLANEOUS) ×4 IMPLANT
YANKAUER SUCT BULB TIP NO VENT (SUCTIONS) ×4 IMPLANT

## 2017-04-28 NOTE — Anesthesia Procedure Notes (Signed)
Procedure Name: Intubation Date/Time: 04/28/2017 1:58 PM Performed by: Dione Booze Pre-anesthesia Checklist: Patient identified, Emergency Drugs available, Suction available and Patient being monitored Patient Re-evaluated:Patient Re-evaluated prior to induction Oxygen Delivery Method: Circle system utilized Preoxygenation: Pre-oxygenation with 100% oxygen Induction Type: IV induction Laryngoscope Size: Mac and 4 Grade View: Grade I Tube type: Oral Tube size: 7.5 mm Number of attempts: 1 Airway Equipment and Method: Stylet Placement Confirmation: ETT inserted through vocal cords under direct vision,  positive ETCO2 and breath sounds checked- equal and bilateral Secured at: 21 cm Tube secured with: Tape Dental Injury: Teeth and Oropharynx as per pre-operative assessment

## 2017-04-28 NOTE — Progress Notes (Signed)
IP PROGRESS NOTE  Subjective:   Charlene [name] appears unchanged. She is scheduled for a diverting colostomy today. Her daughters at the bedside Objective: Vital signs in last 24 hours: Blood pressure (!) 157/58, pulse (!) 56, temperature 99.1 F (37.3 C), temperature source Oral, resp. rate 18, height 5' 4"  (1.626 m), weight 129 lb 1.6 oz (58.6 kg), SpO2 92 %.  Intake/Output from previous day: 07/24 0701 - 07/25 0700 In: 2047.7 [I.V.:2047.7] Out: -   Physical Exam: Not performed today Lab Results:  Recent Labs  04/26/17 0443 04/27/17 0421  WBC 12.1* 10.0  HGB 10.4* 9.6*  HCT 31.9* 30.0*  PLT 335 318    BMET  Recent Labs  04/26/17 0443 04/27/17 0421  NA 133* 135  K 4.3 4.1  CL 101 104  CO2 24 20*  GLUCOSE 94 51*  BUN 15 18  CREATININE 1.20* 1.28*  CALCIUM 8.5* 8.0*      Medications: I have reviewed the patient's current medications.  Assessment/Plan:  1. Colon cancer, sigmoid, stage IIIB (T4,N1), status post a sigmoid colectomy 01/14/2017 ? MSI-stable, no loss of mismatch repair protein expression ? Positive circumferential resection margin ? Colovesical fistula repaired at the time of surgery ? CT abdomen/pelvis 12/22/2016 with distal sigmoid/proximal rectal mass extending to the apex of the vaginal vault and posterior bladder, no evidence of metastatic disease ? Persistently elevated postoperative CEA ? CT 04/19/2017-new liver metastases, retroperitoneal adenopathy, and recurrent tumor in the pelvis with left ureter obstruction and narrowing of the sigmoid colon 2. History of uterine cancer  3.   Diverticulitis  4.   Right lower extremity DVT-on apixaban  5.   Postoperative abscess, status post percutaneous drain, May 2018  6.   Admission 04/25/2017 with sigmoid colon obstruction  7.   Renal insufficiency secondary to obstructive left hydronephrosis   Charlene [name] has been diagnosed with metastatic colon cancer. She is scheduled for a diverting  colostomy today. I discussed the prognosis and treatment options with Charlene [name] and her daughter. They understand no therapy will be curative. We discussed comfort care/hospice versus a trial of palliative chemotherapy. She is interested in considering chemotherapy. We will schedule an outpatient appointment for approximately 2 weeks to reassess her clinical status and discuss the specifics of chemotherapy.  I recommend addressing the left hydronephrosis while she is in the hospital since chemotherapy will be considered.      LOS: 2 days   Charlene Romberg, MD   04/28/2017, 8:35 AM

## 2017-04-28 NOTE — Anesthesia Preprocedure Evaluation (Addendum)
Anesthesia Evaluation  Patient identified by MRN, date of birth, ID band Patient awake    Reviewed: Allergy & Precautions, NPO status , Patient's Chart, lab work & pertinent test results  Airway Mallampati: II  TM Distance: >3 FB Neck ROM: Full    Dental no notable dental hx. (+) Teeth Intact, Caps, Missing, Dental Advisory Given   Pulmonary former smoker,    Pulmonary exam normal breath sounds clear to auscultation       Cardiovascular hypertension, Pt. on medications Normal cardiovascular exam Rhythm:Regular Rate:Normal  BPPV   Neuro/Psych PSYCHIATRIC DISORDERS Depression negative neurological ROS     GI/Hepatic Neg liver ROS, GERD  Medicated and Controlled,Diverticulitis   Endo/Other  Hyperlipidemia  Renal/GU Renal diseaseHx/o ARF 2 years ago, normal renal function currently  negative genitourinary   Musculoskeletal negative musculoskeletal ROS (+)   Abdominal   Peds  Hematology  (+) anemia ,   Anesthesia Other Findings   Reproductive/Obstetrics Uterine Ca                            Anesthesia Physical  Anesthesia Plan  ASA: III  Anesthesia Plan: General   Post-op Pain Management:    Induction: Intravenous  PONV Risk Score and Plan: 2 and Ondansetron, Dexamethasone and Treatment may vary due to age or medical condition  Airway Management Planned: Oral ETT  Additional Equipment:   Intra-op Plan:   Post-operative Plan: Extubation in OR  Informed Consent: I have reviewed the patients History and Physical, chart, labs and discussed the procedure including the risks, benefits and alternatives for the proposed anesthesia with the patient or authorized representative who has indicated his/her understanding and acceptance.   Dental advisory given  Plan Discussed with: CRNA, Anesthesiologist and Surgeon  Anesthesia Plan Comments:         Anesthesia Quick Evaluation

## 2017-04-28 NOTE — H&P (View-Only) (Signed)
Urology Consult Note    Requesting Attending Physician:  Jani Gravel, MD Service Requesting Consult:  Emergency Department Service Providing Consult: Urology  Consulting Attending: Dr. Nicolette Bang   Assessment:  Patient is a 83 y.o. female with metastatic colon cancer with extensive pelvic disease causing left ureteral obstruction, DVT on eliquis,. Patient presented with fatigue, severe constipation and abdominal pain and is clear she is only interested in interventions which will improve her quality of life.   No indication for enmergent intervention regarding left ureteral obstruction. Patients Cr today is 1.4, stable over the last several weeks but increased from baseline of 0.63 in May. UA with minimal signs of infection, with negative nitrites, trace leukocytes and few bacteria on UA. Urine culture pending. Patient with mild leukocytosis, however this could be related to her metastatic disease. She has been afebrile with VSS.    At this time, patient does not seem to be having flank pain originating from her hydronephrosis. Intervention would be required if patient were to have urinary tract infection, be symptomatic, or if her Cr were preventing her from receiving additional therapies. Since none of these are occurring and patient prefers to pursue palliative interventions, recommend managing conservatively.   In the event that patient would need decompression of her left collecting system, recommend percutaneous nephrostomy tube over ureteral stent. I am concerned about ureteral stent placement given high liklihood of failure due to nature of extrinsic compression of ureter, in addition to recent history of tumor involvement with the bladder via a colovesical fistula. This will likely make identification of the ureteral orifice quite difficult, and perhaps impossible.     Recommendations:  1. Follow up urine culture, treat accordingly.  2. If patient were to become febrile, or  hemodynamically unstable please let urology know  3. Should patient desire chemotherapeutic intervention requiring improved renal function, recommend placement of left percutaneous nephrostomy tube by VIR.  4. Otherwise, follow up urology PRN   Thank you for this consult. Please contact the urology consult pager with any further questions/concerns.  Jonna Clark, MD Urology Surgical Resident  -----  Reason for Consult:  Left hydronephrosis   Charlene [name] is seen in consultation for reasons noted above at the request of Jani Gravel, MD on the Emergency Deparmtent service.  This is a 83 y.o. yo patient with a history of metastatic colon cancer. This was diagnosed in April 2018, when she was taken to the operating room for open sigmoidectomy due to an impassible stricture noted on sigmoidoscopy. A colovesical fistula was noted with significant pelvic inflammation intraoperatively. The sigmoid colon was adherent to the left pelvic sidewall and bladder with a large inflammatory mass anteriorly. The bladder was closed with sutures. Resection showed stage III colon cancer. Post operative course was complicated by a DVT requiring anticoagulation therapy and pelvic abscess. Patient decided against pursuing adjuvant chemotherapy.   She underwent repeat staging imaging on 04/22/17 which demonstrated progressive disease, and with new left hydorureteronephrosis to the level of pelvic mass. Pelvic mass noted to be crossing midline to right lower pelvic area. Cr was also noted to be elevated. She was referred to urology for consideration of ureteral stent.   She did not yet make her urology appointment. She presents to the ED today with abdominal pain and severe constipation. She is clear on her goal that she does not want advanced treatment and prefers to make comfort a priority.     Past Medical History: Past Medical History:  Diagnosis Date  .  Acute renal failure ([place])   . Allergy    seasonal  .  BPPV (benign paroxysmal positional vertigo) 2012  . Colon cancer metastasized to liver ([place])   . Depression   . Diverticulitis   . Dyslipidemia   . GERD (gastroesophageal reflux disease)   . Hemorrhoids   . Hypertension 04/25/2017  . Macular degeneration   . Renal insufficiency 04/25/2017  . Urine frequency   . Uterine carcinoma [facility])     Past Surgical History:  Past Surgical History:  Procedure Laterality Date  . ABDOMINAL HYSTERECTOMY    . COLECTOMY N/A 01/14/2017   Procedure: OPEN SIGMOIDECTOMY WITH REPAIR OF COLOVESICULAR FISTULA;  Surgeon: Leighton Ruff, MD;  Location: [facility] ORS;  Service: General;  Laterality: N/A;  . EYE SURGERY     cataract removal bilateral 2012  . FLEXIBLE SIGMOIDOSCOPY N/A 01/11/2017   Procedure: FLEXIBLE SIGMOIDOSCOPY;  Surgeon: Leighton Ruff, MD;  Location: [facility] END[facility]COPY;  Service: Endoscopy;  Laterality: N/A;  . IR RADIOLOGIST EVAL & MGMT  02/11/2017  . TONSILLECTOMY      Medication: Current Facility-Administered Medications  Medication Dose Route Frequency Provider Last Rate Last Dose  . iopamidol (ISOVUE-300) 61 % injection            Current Outpatient Prescriptions  Medication Sig Dispense Refill  . apixaban (ELIQUIS) 2.5 MG TABS tablet Take 2.5 mg by mouth 2 (two) times daily.    . Carboxymethylcellul-Glycerin (LUBRICATING EYE DROPS OP) Apply 1 drop to eye daily as needed (dry eyes).    Marland Kitchen docusate sodium (COLACE) 100 MG capsule Take 100 mg by mouth daily as needed for mild constipation.    . famotidine (PEPCID AC) 10 MG chewable tablet Chew 2 tablets (20 mg total) by mouth at bedtime as needed for heartburn. (Patient taking differently: Chew 10 mg by mouth at bedtime as needed for heartburn. )    . HYDROcodone-acetaminophen (NORCO/VICODIN) 5-325 MG tablet Take 1 tablet by mouth every 6 (six) hours as needed for moderate pain. 40 tablet 0  . Multiple Vitamins-Minerals (MULTIVITAMIN ADULT PO) Take 1 tablet by mouth daily.     . Multiple  Vitamins-Minerals (PRESERVISION AREDS 2) CAPS Take 1 capsule by mouth daily.    . ondansetron (ZOFRAN) 4 MG tablet Take 4 mg by mouth every 6 (six) hours as needed for nausea or vomiting.    . sennosides-docusate sodium (SENOKOT-S) 8.6-50 MG tablet Take 2 tablets by mouth daily.    . protein supplement (RESOURCE BENEPROTEIN) 6 g POWD You can use whatever protein supplement that taste good to you.  Try to get 2-3 cans per day.  Do this till your ready to go home and take care of yourself. (Patient not taking: Reported on 02/11/2017)    . traMADol (ULTRAM) 50 MG tablet Take 1-2 tablets (50-100 mg total) by mouth every 6 (six) hours as needed for moderate pain or severe pain. (Patient not taking: Reported on 03/22/2017) 40 tablet 0    Allergies: No Known Allergies  Social History: Social History  Substance Use Topics  . Smoking status: Former Smoker    Quit date: 01/19/1973  . Smokeless tobacco: Never Used  . Alcohol use Yes     Comment: occ    Family History Family History  Problem Relation Age of Onset  . Heart disease Mother   . Diabetes Mother   . Stroke Father   . Diabetes Father   . Cancer Brother        lung cancer  . Heart  disease Brother     Review of Systems 10 systems were reviewed and are negative except as noted specifically in the HPI.  Objective   Vital signs in last 24 hours: BP (!) 187/99 (BP Location: Left Arm)   Pulse 65   Temp 97.7 F (36.5 C) (Oral)   Resp 18   SpO2 95%   Intake/Output last 3 shifts: No intake/output data recorded.  Physical Exam General: NAD, A&O, resting, appropriate HEENT: [place]/AT, EOMI, MMM Pulmonary: Normal work of breathing on room air Cardiovascular: HDS, adequate peripheral perfusion Abdomen: soft, NTTP, nondistended, no suprapubic fullness or tenderness GU: no CVA tenderness Extremities: warm and well perfused, no edema Neuro: Appropriate, no focal neurological deficits  Most Recent Labs: Lab Results  Component Value  Date   WBC 11.2 (H) 04/25/2017   HGB 11.3 (L) 04/25/2017   HCT 34.2 (L) 04/25/2017   PLT 395 04/25/2017    Lab Results  Component Value Date   NA 135 04/25/2017   K 4.8 04/25/2017   CL 100 (L) 04/25/2017   CO2 26 04/25/2017   BUN 17 04/25/2017   CREATININE 1.46 (H) 04/25/2017   CALCIUM 9.2 04/25/2017   MG 1.8 02/05/2017   PHOS 2.5 02/05/2017    Lab Results  Component Value Date   ALKPHOS 57 04/25/2017   BILITOT 1.2 04/25/2017   BILIDIR <0.1 11/06/2011   PROT 7.5 04/25/2017   ALBUMIN 3.7 04/25/2017   ALT 15 04/25/2017   AST 35 04/25/2017    Lab Results  Component Value Date   INR 1.38 02/03/2017   APTT 38 (H) 02/03/2017     Urine Culture: @[id number] (laburin,org,[id number])@    IMAGING: Ct Abdomen Pelvis Wo Contrast  Result Date: 04/25/2017 CLINICAL DATA:  Abdominal cramping over the last week. Emesis. Recent colon resection. EXAM: CT ABDOMEN AND PELVIS WITHOUT CONTRAST TECHNIQUE: Multidetector CT imaging of the abdomen and pelvis was performed following the standard protocol without IV contrast. COMPARISON:  04/19/2017 FINDINGS: Lower chest: No acute finding.  Hiatal hernia. Hepatobiliary: Small metastatic lesions within the liver demonstrated on the previous contrast-enhanced study cannot be visualized on today's noncontrast exam. These would presumably be unchanged. Small gallstones again visible dependent within the gallbladder. Small stone in the distal common bile duct. This could be symptomatic. Pancreas: Negative Spleen: Negative Adrenals/Urinary Tract: Adrenal glands remain normal. Right kidney contains of small cyst in the lower pole. Small nonobstructing stone in the lower pole. No acute renal finding on the right. Left kidney again shows an upper pole cyst in shows hydroureteronephrosis with the ureter being dilated all the way to the pelvis. There is re- roll obstruction in the pelvis due to recurrent tumor. No primary bladder lesion is seen. Stomach/Bowel:  There is recurrent tumor at the sigmoid colon anastomosis site which is locally extensive. This causes some degree of obstruction as well with relative constipation proximal to that. Vascular/Lymphatic: Aortic atherosclerosis is again demonstrated. Major veins are patent. Left-sided retroperitoneal adenopathy with low-density/necrosis as seen previously. Index node image 42 again measures 2.2 x 2.0 cm. Reproductive: No primary finding. Other: No free air. Musculoskeletal: Chronic spinal degenerative changes. IMPRESSION: Chololithiasis. Small stone also in the common bile duct. This could possibly be symptomatic. Small liver metastases previously demonstrated cannot be seen without contrast. No change would be expected over the last 6 days. Persistent left hydronephrosis due to ureteral obstruction in the left pelvic retroperitoneum where there is extensive recurrent tumor at the sigmoid anastomosis with local extension. Some relative large bowel obstruction  with a large amount of fecal matter proximal to this region. Metastatic retroperitoneal lymphadenopathy unchanged since the study of 6 days ago. Electronically Signed   By: Nelson Chimes M.D.   On: 04/25/2017 19:31

## 2017-04-28 NOTE — Progress Notes (Signed)
Patient ID: Charlene [name], female   DOB: 04-08-1928, 83 y.o.   MRN: [id number]  [facility] Surgery Progress Note     Subjective: CC- large bowel obstruction Family at bedside. Patient anxious about surgery today. She reports mild abdominal pain, main complaint is back pain. She had a small BM with enema yesterday, passing flatus this morning. Tolerated clear liquids yesterday. Denies n/v.  Objective: Vital signs in last 24 hours: Temp:  [98.9 F (37.2 C)-99.1 F (37.3 C)] 99.1 F (37.3 C) (07/25 0517) Pulse Rate:  [56-58] 56 (07/25 0517) Resp:  [18] 18 (07/25 0517) BP: (144-198)/(57-75) 157/58 (07/25 0517) SpO2:  [92 %-95 %] 92 % (07/25 0517) Last BM Date: 04/26/17  Intake/Output from previous day: 07/24 0701 - 07/25 0700 In: 2047.7 [I.V.:2047.7] Out: -  Intake/Output this shift: No intake/output data recorded.  PE: Gen:  Alert, NAD, pleasant HEENT: EOM's intact, pupils equal  Card:  RRR, no M/G/R heard Pulm:  CTAB, no W/R/R, effort normal Abd: Soft, nondistended, +BS, no HSM, no hernia, well healed midline incision C/D/I, mild lower abdominal tenderness Ext:  No erythema, edema, or tenderness BUE/BLE  Psych: A&Ox3  Skin: no rashes noted, warm and dry  Lab Results:   Recent Labs  04/26/17 0443 04/27/17 0421  WBC 12.1* 10.0  HGB 10.4* 9.6*  HCT 31.9* 30.0*  PLT 335 318   BMET  Recent Labs  04/26/17 0443 04/27/17 0421  NA 133* 135  K 4.3 4.1  CL 101 104  CO2 24 20*  GLUCOSE 94 51*  BUN 15 18  CREATININE 1.20* 1.28*  CALCIUM 8.5* 8.0*   PT/INR  Recent Labs  04/26/17 0443  LABPROT 16.9*  INR 1.37   CMP     Component Value Date/Time   NA 135 04/27/2017 0421   NA 136 04/19/2017 1044   K 4.1 04/27/2017 0421   K 4.5 04/19/2017 1044   CL 104 04/27/2017 0421   CO2 20 (L) 04/27/2017 0421   CO2 23 04/19/2017 1044   GLUCOSE 51 (L) 04/27/2017 0421   GLUCOSE 96 04/19/2017 1044   BUN 18 04/27/2017 0421   BUN 19.9 04/19/2017 1044   CREATININE  1.28 (H) 04/27/2017 0421   CREATININE 1.4 (H) 04/19/2017 1044   CALCIUM 8.0 (L) 04/27/2017 0421   CALCIUM 9.7 04/19/2017 1044   PROT 6.3 (L) 04/26/2017 0443   ALBUMIN 3.1 (L) 04/26/2017 0443   AST 21 04/26/2017 0443   ALT 13 (L) 04/26/2017 0443   ALKPHOS 49 04/26/2017 0443   BILITOT 0.7 04/26/2017 0443   GFRNONAA 37 (L) 04/27/2017 0421   GFRAA 43 (L) 04/27/2017 0421   Lipase     Component Value Date/Time   LIPASE 24 04/25/2017 1504       Studies/Results: No results found.  Anti-infectives: Anti-infectives    Start     Dose/Rate Route Frequency Ordered Stop   04/28/17 1215  cefoTEtan in Dextrose 5% (CEFOTAN) IVPB 2 g     2 g Intravenous On call to O.R. 04/27/17 1255 04/29/17 0559   04/28/17 0600  cefoTEtan (CEFOTAN) 2 g in dextrose 5 % 50 mL IVPB  Status:  Discontinued     2 g 100 mL/hr over 30 Minutes Intravenous On call to O.R. 04/27/17 1233 04/27/17 1255   04/25/17 2100  cefTRIAXone (ROCEPHIN) 1 g in dextrose 5 % 50 mL IVPB  Status:  Discontinued     1 g 100 mL/hr over 30 Minutes Intravenous Every 24 hours 04/25/17 2052 04/27/17 1220  Assessment/Plan Metastatic colon cancer to liver Large bowel obstruction - s/p open sigmoidectomy with repair of colovesicular fistula 01/14/17 by Dr. Marcello Moores - 1 week of progressive lower abdominal pain, n/v, anorexia, constipation - CT scan showed large bowel obstruction due to recurrent tumor at sigmoid anastomosis with large amount of fecal matter proximal to this region, left hydronephrosis, and likely small metastatic liver lesions - not a candidate for stent placement - planning for diverting loop colostomy later today. Continue NPO, hold heparin.  Renal insufficiency/Left hydronephrosis - per urology Anemia DVT - on eliquis at home (last dose 930AM 7/23)  ID - rocephin 7/22>>7/24. Cefotetan on call to OR VTE - SCDs. Hold heparin for procedure FEN - IVF, NPO Code status - DNR   LOS: 2 days    Jerrye Beavers ,  [facility] Surgery 04/28/2017, 7:34 AM Pager: [phone] Consults: [phone] Mon-Fri 7:00 am-4:30 pm Sat-Sun 7:00 am-11:30 am

## 2017-04-28 NOTE — Progress Notes (Signed)
PROGRESS NOTE    DIANDRA CIMINI  SNK:[id number] DOB: 1927-10-07 DOA: 04/25/2017 PCP: Shon Baton, MD    Brief Narrative:  Charlene [name] is a 83 yo female with PMHx of metastatic colon cancer to liver who presents with lower abdominal pain, lack of bowel movement in > 1 week, nausea. CT abdomen and pelvis was completed emergency department, which showed small liver metastases, persistent left hydronephrosis due to ureteral obstruction in the left pelvic retroperitoneum, large bowel obstruction with a large amount of fecal matter proximal to this region. Urology, general surgery, gastroenterology has been consulted. Patient is scheduled for diverting loop colostomy 04/28/2017.    Assessment & Plan:   Principal Problem:   Abdominal pain Active Problems:   UTI (urinary tract infection)   Renal insufficiency   Anemia   Hypertension   Large bowel obstruction (HCC)   Malignant neoplasm of sigmoid colon (HCC)   Hydronephrosis   Encounter for palliative care   Goals of care, counseling/discussion  Metastatic colon cancer to the liver /Large bowel obstruction  -GI and general surgery has been consulted. Per GI, colonic stent is not feasible due to tumor length and tortuosity of colon. Patient status post open sigmoidectomy would repair of colovesicular fistula 01/14/2017 per Dr. Marcello Moores.  General surgery/GI recommending diverting colostomy to be done today 04/28/2017.. - s/p Enema and digital disimpaction with no significant results.  Acute kidney injury secondary to obstruction, with left hydronephrosis -Baseline Cr 0.63. Cr 1.16 -Urology consulted, no emergent indication for intervention at this point.   - Patient for cystoscopy with retrograde pyelogram/right ureteral stent placement per urology 04/28/2017.  -Trend BMP and avoid nephrotoxins. Cr stable currently. -Monitor I/O  - Outpatient follow-up with urology.  Pyuria -Urine culture with contamination. IV Rocephin discontinued.    Metastatic colon cancer to the liver s/p open sigmoidectomy with repair of colovesicular fistula  -Patient follows with Dr. Benay Spice -Palliative care also following   Anemia of chronic disease -Hgb stable, monitor   DVT -Holding eliquis for now pending surgical intervention. Started heparin gtt, which was held 6 hours prior to surgery. General surgery to advise when heparin drip and anticoagulation may be resumed.     DVT prophylaxis: [place] Pall eloquence for now pending surgical intervention. SCD/heparin drip Code Status: DO NOT RESUSCITATE Family Communication: Updated patient. No family at bedside. Disposition Plan: Pending consultative service and surgical intervention and per general surgery.   Consultants:   Palliative care Dr. Domingo Cocking 04/25/2017  Urology Dr. Alyson Ingles 04/25/2017  Gen. surgery Dr. Excell Seltzer 04/26/2017  Gastroenterology Dr.Danis III 04/26/2017  Wound care  Oncology Dr. Benay Spice 04/27/2017  Procedures:   CT abdomen and pelvis 04/25/2017    Antimicrobials:   IV Rocephin 04/25/2017>>>> 04/27/2017   Subjective: Patient laying in bed. Patient states unable to sleep overnight. Patient passing flatus. No bowel movement. Patient somewhat anxious about procedure today.  Objective: Vitals:   04/27/17 0610 04/27/17 2100 04/28/17 0047 04/28/17 0517  BP: (!) 150/58 (!) 198/75 (!) 144/57 (!) 157/58  Pulse: 66 (!) 58  (!) 56  Resp: 18 18  18   Temp: 98.6 F (37 C) 98.9 F (37.2 C)  99.1 F (37.3 C)  TempSrc: Oral Oral  Oral  SpO2: 93% 95%  92%  Weight: 58.6 kg (129 lb 1.6 oz)     Height:        Intake/Output Summary (Last 24 hours) at 04/28/17 1010 Last data filed at 04/28/17 0700  Gross per 24 hour  Intake  2047.74 ml  Output                0 ml  Net          2047.74 ml   Filed Weights   04/25/17 2145 04/26/17 0550 04/27/17 0610  Weight: 57.1 kg (125 lb 14.1 oz) 57.8 kg (127 lb 6.8 oz) 58.6 kg (129 lb 1.6 oz)     Examination:  General exam: Appears calm and comfortable  Respiratory system: Clear to auscultation anterior lung fields. Respiratory effort normal. Cardiovascular system: S1 & S2 heard, RRR. No JVD, murmurs, rubs, gallops or clicks. No pedal edema. Gastrointestinal system: Abdomen is nondistended, soft and nontender. No organomegaly or masses felt. Normal bowel sounds heard. Central nervous system: Alert and oriented. No focal neurological deficits. Extremities: Symmetric 5 x 5 power. Skin: No rashes, lesions or ulcers Psychiatry: Judgement and insight appear normal. Mood & affect appropriate.     Data Reviewed: I have personally reviewed following labs and imaging studies  CBC:  Recent Labs Lab 04/25/17 1504 04/26/17 0443 04/27/17 0421  WBC 11.2* 12.1* 10.0  NEUTROABS  --   --  8.0*  HGB 11.3* 10.4* 9.6*  HCT 34.2* 31.9* 30.0*  MCV 80.9 80.6 81.7  PLT 395 335 932   Basic Metabolic Panel:  Recent Labs Lab 04/25/17 1504 04/26/17 0443 04/27/17 0421 04/28/17 0825 04/28/17 0833  NA 135 133* 135 134*  --   K 4.8 4.3 4.1 4.0  --   CL 100* 101 104 105  --   CO2 26 24 20* 20*  --   GLUCOSE 86 94 51* 59*  --   BUN 17 15 18 15   --   CREATININE 1.46* 1.20* 1.28* 1.16*  --   CALCIUM 9.2 8.5* 8.0* 8.1*  --   MG  --   --   --   --  1.8   GFR: Estimated Creatinine Clearance: 30.1 mL/min (A) (by C-G formula based on SCr of 1.16 mg/dL (H)). Liver Function Tests:  Recent Labs Lab 04/25/17 1504 04/26/17 0443  AST 35 21  ALT 15 13*  ALKPHOS 57 49  BILITOT 1.2 0.7  PROT 7.5 6.3*  ALBUMIN 3.7 3.1*    Recent Labs Lab 04/25/17 1504  LIPASE 24   No results for input(s): AMMONIA in the last 168 hours. Coagulation Profile:  Recent Labs Lab 04/26/17 0443  INR 1.37   Cardiac Enzymes: No results for input(s): CKTOTAL, CKMB, CKMBINDEX, TROPONINI in the last 168 hours. BNP (last 3 results) No results for input(s): PROBNP in the last 8760 hours. HbA1C: No  results for input(s): HGBA1C in the last 72 hours. CBG: No results for input(s): GLUCAP in the last 168 hours. Lipid Profile: No results for input(s): CHOL, HDL, LDLCALC, TRIG, CHOLHDL, LDLDIRECT in the last 72 hours. Thyroid Function Tests: No results for input(s): TSH, T4TOTAL, FREET4, T3FREE, THYROIDAB in the last 72 hours. Anemia Panel: No results for input(s): VITAMINB12, FOLATE, FERRITIN, TIBC, IRON, RETICCTPCT in the last 72 hours. Sepsis Labs: No results for input(s): PROCALCITON, LATICACIDVEN in the last 168 hours.  Recent Results (from the past 240 hour(s))  Urine culture     Status: Abnormal   Collection Time: 04/25/17 10:46 PM  Result Value Ref Range Status   Specimen Description URINE, CLEAN CATCH  Final   Special Requests NONE  Final   Culture MULTIPLE SPECIES PRESENT, SUGGEST RECOLLECTION (A)  Final   Report Status 04/27/2017 FINAL  Final  Surgical pcr screen  Status: None   Collection Time: 04/27/17  9:18 PM  Result Value Ref Range Status   MRSA, PCR NEGATIVE NEGATIVE Final   Staphylococcus aureus NEGATIVE NEGATIVE Final    Comment:        The Xpert SA Assay (FDA approved for NASAL specimens in patients over 35 years of age), is one component of a comprehensive surveillance program.  Test performance has been validated by [facility] for patients greater than or equal to 32 year old. It is not intended to diagnose infection nor to guide or monitor treatment.          Radiology Studies: No results found.      Scheduled Meds: . cefoTEtan in Dextrose 5%  2 g Intravenous On Call to OR  . hydrocortisone   Rectal QID  . multivitamin-lutein  1 capsule Oral Daily  . senna-docusate  2 tablet Oral Daily   Continuous Infusions: . sodium chloride 100 mL/hr at 04/28/17 0931     LOS: 2 days    Time spent: 3 mins    THOMPSON,DANIEL, MD [facility] Pager [phone]  If 7PM-7AM, please contact night-coverage [email or web address] Password  [email or web address] 04/28/2017, 10:10 AM

## 2017-04-28 NOTE — Transfer of Care (Signed)
Immediate Anesthesia Transfer of Care Note  Patient: Charlene [name]  Procedure(s) Performed: Procedure(s) with comments: LAPAROSCOPIC ASSISTED DIVERTING  LOOP COLOSTOMY (N/A) CYSTOSCOPY WITH LEFT RETROGRADE PYELOGRAM/ LEFTURETERAL STENT PLACEMENT (Left) - MATURED THE STOMA  Patient Location: PACU  Anesthesia Type:General  Level of Consciousness: awake and patient cooperative  Airway & Oxygen Therapy: Patient Spontanous Breathing and Patient connected to face mask oxygen  Post-op Assessment: Report given to RN and Post -op Vital signs reviewed and stable  Post vital signs: Reviewed and stable  Last Vitals:  Vitals:   04/28/17 0047 04/28/17 0517  BP: (!) 144/57 (!) 157/58  Pulse:  (!) 56  Resp:  18  Temp:  37.3 C    Last Pain:  Vitals:   04/28/17 1321  TempSrc:   PainSc: 0-No pain      Patients Stated Pain Goal: 3 (41/63/84 5364)  Complications: No apparent anesthesia complications

## 2017-04-28 NOTE — Progress Notes (Signed)
Patient ID: Charlene [name], female   DOB: 1927-12-08, 83 y.o.   MRN: [id number]  We were unable to place a retrograde ureteral stent due to complete left ureteral obstruction. Her other option for decompression of her left kidney would be a left nephrostomy tube.  However, this would come at the risk of having a permanent nephrostomy tube as it may be unlikely to be able to place an antegrade ureteral stent based on findings today intraoperatively.  Furthermore, an indwelling stent may result in significant and symptomatic hematuria considering the tumor that appears to be quite close to the trigone of bladder and appears to be quite friable.  In addition, a permanent left nephrostomy tube would certainly put the patient at risk for pyelonephritis and required frequent nephrostomy changes approximately every 6 weeks.  I have spoken with Dr. Learta Codding and Dr. Alyson Ingles.  They will plan to discuss the options further with the patient and her family to determine how aggressive they wish to be in treating her left hydronephrosis in the setting of relatively well-preserved global renal function.

## 2017-04-28 NOTE — Interval H&P Note (Deleted)
History and Physical Interval Note:  04/28/2017 1:45 PM  Charlene [name]  has presented today for surgery, with the diagnosis of Large bowel obstruction  The various methods of treatment have been discussed with the patient and family. After consideration of risks, benefits and other options for treatment, the patient has consented to  Procedure(s):   CYSTOSCOPY WITH RETROGRADE PYELOGRAM/URETERAL STENT PLACEMENT (Right) as a surgical intervention for improvement of kidney function .  The patient's history has been reviewed, patient examined, no change in status, stable for surgery.  I have reviewed the patient's chart and labs.  Questions were answered to the patient's satisfaction.     Armie Moren L

## 2017-04-28 NOTE — Op Note (Signed)
LAPAROSCOPIC ASSISTED DIVERTING  LOOP COLOSTOMY  Procedure Note  DEJANIRA PAMINTUAN 04/25/2017 - 04/28/2017   Pre-op Diagnosis: Large bowel obstruction     Post-op Diagnosis: same  Procedure(s): LAPAROSCOPIC ASSISTED DIVERTING  LOOP COLOSTOMY CYSTOSCOPY WITH LEFT RETROGRADE PYELOGRAM/ LEFTURETERAL STENT PLACEMENT  Surgeon(s): Coralie Keens, MD Raynelle Bring, MD  Anesthesia: General  Staff:  Circulator: Lenoard Aden, RN Relief Circulator: Ignacia Palma, RN Relief Scrub: Stevan Born Scrub Person: Regan Lemming, CST; Billie Lade, [place], CST  Estimated Blood Loss: Minimal               Indications: This is a patient with obstructing distal sigmoid colon cancer. She could not be stented. Decision has been made to proceed with a diverting loop colostomy.  Procedure: The patient was already in the operating room and her general anesthesia having just undergone an attempt at a ureteral stent by urology. After this was completed, she was placed back into the supine position. Her abdomen was then prepped and draped in usual sterile fashion. I made a small incision the patient's left upper quadrant where she had been previously marked by the ostomy nurse. I then used the 5 mm trocar and Optiview camera to slowly traversing all layers of the abdominal wall under direct vision and gain entrance into the abdominal cavity. Insufflation of the abdomen was then begun. I evaluated the introduction site and saw no evidence of bowel injury. I then placed two 5 mm trochars in the patient's right abdomen under direct vision after incision with scalpel.  The patient had moderate amount of adhesions in the lower abdomen. I was able to easily identify the distal transverse colon. At this point through the 5 mm trocar at the ostomy site as able to grasp the bowel clamp and pulled up to the abdominal wall. I then removed the trocar. I made the incision larger at the skin with a scalpel. I  then taken down the fascia which open slightly more. I then pulled up the distal transverse colon as the loop colostomy. I have been mesentery underneath: And then placed a skin bridge through this. I sutured the skin bridge in place with nylon sutures. I then opened up the colon with the electrocautery. I was able to easily pass a finger both proximal and distal below the fascia. I then matured the ostomy circumferentially with interrupted 3-0 Vicryl sutures. The ostomy appeared pink and well-perfused. I anesthetized the other 5 mm trocar sites with Marcaine. I then closed these with 4-0 Monocryl sutures and Dermabond. An ostomy appliance was applied. The patient tolerated the procedure well. All counts were correct at the end of the procedure. The patient was then extubated in the operating room and taken in a stable condition to the recovery room.          Regana Kemple A   Date: 04/28/2017  Time: 3:11 PM

## 2017-04-28 NOTE — Anesthesia Postprocedure Evaluation (Signed)
Anesthesia Post Note  Patient: Charlene [name]  Procedure(s) Performed: Procedure(s) (LRB): LAPAROSCOPIC ASSISTED DIVERTING  LOOP COLOSTOMY (N/A) CYSTOSCOPY WITH LEFT RETROGRADE PYELOGRAM/ LEFTURETERAL STENT PLACEMENT (Left)     Patient location during evaluation: PACU Anesthesia Type: General Level of consciousness: awake and alert, patient cooperative and oriented Pain management: pain level controlled Vital Signs Assessment: post-procedure vital signs reviewed and stable Respiratory status: spontaneous breathing, nonlabored ventilation, respiratory function stable and patient connected to nasal cannula oxygen Cardiovascular status: blood pressure returned to baseline and stable Postop Assessment: no signs of nausea or vomiting Anesthetic complications: no    Last Vitals:  Vitals:   04/28/17 1653 04/28/17 1700  BP: (!) 198/69 (!) 135/53  Pulse:  61  Resp:    Temp:      Last Pain:  Vitals:   04/28/17 1620  TempSrc:   PainSc: 0-No pain                 Teondre Jarosz,E. Tyasia Packard

## 2017-04-28 NOTE — Op Note (Signed)
Urology Operative Note  Preoperative Diagnosis: Left hydronephrosis   Postoperative Diagnosis: Left hydronephrosis, complete left ureteral obstruction   Procedure(s) Performed:    1. Cystourethroscopy 2. Left retrograde pyelogram 3. Intraoperative fluoroscopy with interpretation <1hr.   Teaching Surgeon:  Dutch Gray, MD  Resident Surgeon:  Jonna Clark, MD  Assistant(s):  None  Anesthesia:  General via endotracheal tube   Fluids:  See anesthesia record  Estimated blood loss:  0 mL  Specimens:  0  Cultures:  0  Drains:  None  Complications:  Unable to pass wire past left UVJ due to complete obstruction   Indications: 83 y.o. female with a history of metastatic colon cancer and a large left pelvic mass undergoing palliative diverting colosotomy. Left hydroureteronephrosis was noted on imaging to the level of the pelvic mass, and she was noted to have AKI. In preparation for palliative chemotherapy, medical oncology requested optimization of her renal function. She therefore presents for cystourethroscopy and left retrograde pyelogram, left ureteral stent placement  Findings:  Invasion of pelvic mass into bladder, distorting trigonal and bladder floor anatomy. Large invading mass, measuring around 5cm within bladder. Right ureteral orifice not able to be easily visualized. Left ureteral orifice visualized, cannulated, however wire and 5Fr open ended catheter unable to be advanced beyond 1cm beyond UVJ.   Radiologic Interpretation of Retrograde Pyelogram: Left retrograde pyelogram demonstrated no contrast extravasation, thin tortuous distal left ureter outlined with contrast. No contrast passage beyond 1cm past UVJ.   Description:  The patient was correctly identified in the preop holding area where written informed consent as well potential risk and complication reviewed. The patient agreed. They were brought back to the operative suite where a preinduction timeout was  performed. Once correct information was verified, general anesthesia was induced via endotracheal tube. They were then gently placed into dorsal lithotomy position with SCDs in place for VTE prophylaxis. They were prepped and draped in the usual sterile fashion and given appropriate preoperative antibiotics with cefazolin. A second timeout was then performed.   We inserted a 36F rigid cystoscope per urethra with copious lubrication and normal saline irrigation running. This demonstrated a normal urethra, blood tinged urine on entry to the bladder, and findings as described above.   We turned our attention to the left ureteral orifice which we were eventually able to find, though difficult. We cannulated the left UO with a combination of a 5 [place] open-ended catheter and a sensor wire. The sensor wire was advanced but hit significant resistance. We were unable to pass it beyond 1cm past the L UVJ. We advanced the 5Fr open ended catheter about 1cm into the left ureter and removed the wire.   A left retrograde pyelogram was performed with findings as noted above. We then tried with a hydrophilic angled glide wire, but again could not advance it beyond 1cm past left UVJ. At this time, we elected to abort the procedure.   The bladder was left full and all instrumentation was removed. A 16 Fr Foley urethral catheter was placed with 10 mL of sterile water put into the catheter balloon.   The case was then turned over to the general surgery team for their portion of the procedure.   Post Op Plan:   1. Foley catheter per primary team 2. Urology will continue to follow. See Dr. Lynne Logan brief summary regarding further options for patient's hydronephrosis decompression. Will discuss with patient post operatively.  Attestation:  Dr. Alinda Money was present for the entire procedure.  Jonna Clark, MD Resident, Department of Urology

## 2017-04-28 NOTE — Interval H&P Note (Signed)
History and Physical Interval Note:  04/28/2017 1:50 PM  Charlene [name]  has presented today for surgery, with the diagnosis of Large bowel obstruction  The various methods of treatment have been discussed with the patient and family. After consideration of risks, benefits and other options for treatment, the patient has consented to  Procedure(s):   CYSTOSCOPY WITH RETROGRADE PYELOGRAM/URETERAL STENT PLACEMENT (left) as a surgical intervention .  The patient's history has been reviewed, patient examined, no change in status, stable for surgery.  I have reviewed the patient's chart and labs.  Questions were answered to the patient's satisfaction.     [name] L

## 2017-04-28 NOTE — Progress Notes (Signed)
Patient ID: KYRIE BUN, female   DOB: July 27, 1928, 83 y.o.   MRN: [id number]   Pre Procedure note for inpatients:   Charlene [name] has been scheduled for Procedure(s): LAPAROSCOPIC DIVERTING  LOOP COLOSTOMY (N/A) CYSTOSCOPY WITH RETROGRADE PYELOGRAM/URETERAL STENT PLACEMENT (Right) today. The various methods of treatment have been discussed with the patient. After consideration of the risks, benefits and treatment options the patient has consented to the planned procedure.  I discussed the risks which include but are not limited to bleeding, infection, injury to surrounding structures, the need to convert to an open procedure, the need for further procedures, cardiopulmonary problems, etc.  The patient has been seen and labs reviewed. There are no changes in the patient's condition to prevent proceeding with the planned procedure today.  Recent labs:  Lab Results  Component Value Date   WBC 10.0 04/27/2017   HGB 9.6 (L) 04/27/2017   HCT 30.0 (L) 04/27/2017   PLT 318 04/27/2017   GLUCOSE 59 (L) 04/28/2017   ALT 13 (L) 04/26/2017   AST 21 04/26/2017   NA 134 (L) 04/28/2017   K 4.0 04/28/2017   CL 105 04/28/2017   CREATININE 1.16 (H) 04/28/2017   BUN 15 04/28/2017   CO2 20 (L) 04/28/2017   TSH 1.398 03/13/2015   INR 1.37 04/26/2017    Quanell Loughney A, MD 04/28/2017 10:20 AM

## 2017-04-28 NOTE — Progress Notes (Signed)
[facility] for heparin Indication: DVT - bridge while off apixaban for surgery  No Known Allergies  Patient Measurements: Height: 5\' 4"  (162.6 cm) Weight: 129 lb (58.5 kg) IBW/kg (Calculated) : 54.7 Heparin Dosing Weight: 59kg  Vital Signs: Temp: 97.8 F (36.6 C) (07/25 1525) Temp Source: Oral (07/25 0517) BP: 179/64 (07/25 1554) Pulse Rate: 55 (07/25 1555)  Labs:  Recent Labs  04/26/17 0443 04/27/17 0421 04/27/17 1357 04/27/17 1359 04/27/17 2029 04/28/17 0825  HGB 10.4* 9.6*  --   --   --   --   HCT 31.9* 30.0*  --   --   --   --   PLT 335 318  --   --   --   --   APTT 41*  --   --  43* 127*  --   LABPROT 16.9*  --   --   --   --   --   INR 1.37  --   --   --   --   --   HEPARINUNFRC  --   --  1.46*  --  1.50*  --   CREATININE 1.20* 1.28*  --   --   --  1.16*    Estimated Creatinine Clearance: 30.1 mL/min (A) (by C-G formula based on SCr of 1.16 mg/dL (H)).   Medical History: Past Medical History:  Diagnosis Date  . Acute renal failure ([place])   . Allergy    seasonal  . BPPV (benign paroxysmal positional vertigo) 2012  . Colon cancer metastasized to liver ([place])   . Depression   . Diverticulitis   . Dyslipidemia   . GERD (gastroesophageal reflux disease)   . Hemorrhoids   . Hypertension 04/25/2017  . Macular degeneration   . Renal insufficiency 04/25/2017  . Urine frequency   . Uterine carcinoma ([facility])     Assessment: 17 YOF with a large bowel obstruction with a history of metastatic colon cancer.  She has a history of DVT on apixaban.  Although the manufacturer does not recommend a dosage adjustment for renal function, she was on apixaban 2.5mg   BID.  Apixaban on hold since patient going to OR 7/25 and bridging with IV heparin.   Last dose of apixaban was 7/22 at 0930  Baseline aPTT = 41 sec,  HL = 1.46 (falsely elevated due to effects of apixaban),  INR = 1.37  Yesterday, aPTT was above goal on heparin 950  units/hr so heparin rate was decreased to 850 units/hr.  Heparin was stopped for surgery before a repeat aPTT was obtained.  Goal of Therapy:  Heparin level 0.3-0.7 aPTT 66-102 seconds Monitor platelets by anticoagulation protocol: Yes   Plan: Per Dr. Ninfa Linden, ok to resume heparin 8hr after surgery (approximately 11p tonight)  Resume heparin without a bolus at 850 units/hr  Check aPTT and heparin level 8 hr after resuming  Daily CBC, aPTT and heparin level - once levels correlate, can monitor heparin levels only  Follow up plans for transition back to apixaban  Peggyann Juba, PharmD, BCPS Pager: [phone] 04/28/2017 4:06 PM

## 2017-04-29 ENCOUNTER — Encounter (HOSPITAL_COMMUNITY): Payer: Self-pay | Admitting: Surgery

## 2017-04-29 LAB — BASIC METABOLIC PANEL
Anion gap: 11 (ref 5–15)
BUN: 17 mg/dL (ref 6–20)
CALCIUM: 8 mg/dL — AB (ref 8.9–10.3)
CHLORIDE: 106 mmol/L (ref 101–111)
CO2: 16 mmol/L — ABNORMAL LOW (ref 22–32)
CREATININE: 1.2 mg/dL — AB (ref 0.44–1.00)
GFR calc non Af Amer: 40 mL/min — ABNORMAL LOW (ref >60)
GFR, EST AFRICAN AMERICAN: 46 mL/min — AB (ref >60)
Glucose, Bld: 114 mg/dL — ABNORMAL HIGH (ref 65–99)
Potassium: 4.7 mmol/L (ref 3.5–5.1)
SODIUM: 133 mmol/L — AB (ref 135–145)

## 2017-04-29 LAB — CBC WITH DIFFERENTIAL/PLATELET
BASOS ABS: 0 10*3/uL (ref 0.0–0.1)
BASOS PCT: 0 %
EOS ABS: 0 10*3/uL (ref 0.0–0.7)
Eosinophils Relative: 0 %
HCT: 31.8 % — ABNORMAL LOW (ref 36.0–46.0)
HEMOGLOBIN: 10.2 g/dL — AB (ref 12.0–15.0)
Lymphocytes Relative: 4 %
Lymphs Abs: 0.5 10*3/uL — ABNORMAL LOW (ref 0.7–4.0)
MCH: 26.1 pg (ref 26.0–34.0)
MCHC: 32.1 g/dL (ref 30.0–36.0)
MCV: 81.3 fL (ref 78.0–100.0)
Monocytes Absolute: 0.2 10*3/uL (ref 0.1–1.0)
Monocytes Relative: 2 %
NEUTROS PCT: 94 %
Neutro Abs: 11.1 10*3/uL — ABNORMAL HIGH (ref 1.7–7.7)
Platelets: 367 10*3/uL (ref 150–400)
RBC: 3.91 MIL/uL (ref 3.87–5.11)
RDW: 17.6 % — ABNORMAL HIGH (ref 11.5–15.5)
WBC: 11.8 10*3/uL — AB (ref 4.0–10.5)

## 2017-04-29 LAB — HEPARIN LEVEL (UNFRACTIONATED): Heparin Unfractionated: 0.94 IU/mL — ABNORMAL HIGH (ref 0.30–0.70)

## 2017-04-29 LAB — APTT: APTT: 132 s — AB (ref 24–36)

## 2017-04-29 LAB — MAGNESIUM: MAGNESIUM: 1.7 mg/dL (ref 1.7–2.4)

## 2017-04-29 MED ORDER — MORPHINE SULFATE (PF) 4 MG/ML IV SOLN
1.0000 mg | INTRAVENOUS | Status: DC | PRN
  Administered 2017-04-29 – 2017-05-02 (×6): 2 mg via INTRAVENOUS
  Administered 2017-05-03: 3 mg via INTRAVENOUS
  Filled 2017-04-29 (×7): qty 1

## 2017-04-29 MED ORDER — BOOST / RESOURCE BREEZE PO LIQD
1.0000 | Freq: Two times a day (BID) | ORAL | Status: DC
  Administered 2017-04-29 – 2017-05-06 (×5): 1 via ORAL

## 2017-04-29 NOTE — Progress Notes (Signed)
Charlene [name] for heparin Indication: DVT - bridge while off apixaban for surgery  No Known Allergies  Patient Measurements: Height: 5\' 4"  (162.6 cm) Weight: 129 lb (58.5 kg) IBW/kg (Calculated) : 54.7 Heparin Dosing Weight: 59kg  Vital Signs: Temp: 97.6 F (36.4 C) (07/26 0541) Temp Source: Oral (07/26 0541) BP: 146/54 (07/26 0541) Pulse Rate: 66 (07/26 0541)  Labs:  Recent Labs  04/27/17 0421 04/27/17 1357 04/27/17 1359 04/27/17 2029 04/28/17 0825 04/29/17 0456 04/29/17 0753  HGB 9.6*  --   --   --   --  10.2*  --   HCT 30.0*  --   --   --   --  31.8*  --   PLT 318  --   --   --   --  367  --   APTT  --   --  43* 127*  --   --  132*  HEPARINUNFRC  --  1.46*  --  1.50*  --   --   --   CREATININE 1.28*  --   --   --  1.16* 1.20*  --     Estimated Creatinine Clearance: 29.1 mL/min (A) (by C-G formula based on SCr of 1.2 mg/dL (H)).   Medical History: Past Medical History:  Diagnosis Date  . Acute renal failure ([place])   . Allergy    seasonal  . BPPV (benign paroxysmal positional vertigo) 2012  . Colon cancer metastasized to liver ([place])   . Depression   . Diverticulitis   . Dyslipidemia   . GERD (gastroesophageal reflux disease)   . Hemorrhoids   . Hypertension 04/25/2017  . Macular degeneration   . Renal insufficiency 04/25/2017  . Urine frequency   . Uterine carcinoma (H[facility])     Assessment: 28 YOF with a large bowel obstruction with a history of metastatic colon cancer.  She has a history of DVT on apixaban 2.5mg  BID (dose reduced due to bleeding, advanced age & decreased renal function).  Apixaban on hold since patient going to OR 7/25 and bridging with IV heparin.   Last dose of apixaban was 7/22 at 0930  Baseline aPTT = 41 sec,  HL = 1.46 (falsely elevated due to effects of apixaban),  INR = 1.37  04/29/2017:   Heparin infusion at 850 units/hr   APTT = 132 sec (above goal range) and Heparin level = 0.94 ( still  elevated, but trending down)  Hg low but stable, Pltc WNL  No bleeding or infusion related issues reported  Goal of Therapy:  Heparin level 0.3-0.7 aPTT 66-102 seconds Monitor platelets by anticoagulation protocol: Yes   Plan: Per Dr. Ninfa Linden, ok to resume heparin 8hr after surgery (approximately 11p tonight)  Decrease heparin infusion to 700 units/hr  Check aPTT 8 hr after rate change  Daily CBC, aPTT and heparin level - once levels correlate, can monitor heparin levels only  Follow up plans for transition back to apixaban once invasive procedures completed- IR consult pending  [name], PharmD, BCPS Pager: [phone] 04/29/2017 9:52 AM

## 2017-04-29 NOTE — Progress Notes (Signed)
Pt up in chair and requesting to go back to bed. When Pt stood up moderate amount of bright red blood noted in the floor. Pt placed back to bed and RN assessed Pt and noted blood was coming from around Foley Catheter. Dr. Grandville Silos on the floor and made aware and Dr. Alinda Money also on the floor came and assessed Pt. Order to irrigate foley catheter given and per Dr. Grandville Silos heparin off until after irrigation done and urine starts to clear. VS 159/57 Pulse 68

## 2017-04-29 NOTE — Consult Note (Addendum)
[facility] Nurse ostomy consult note Stoma type/location: LUQ Loop colostomy with plastic stoma bridge.  Stomal assessment/size: 2 and 1/4 inches, round, red, moist.  Peristomal assessment: intact, clear, sutures (2) pulling skin with tension. Two sutures removed.  Bridge is stable and intact. Treatment options for stomal/peristomal skin: Skin barrier ring around ostomy, skin barrier rings placed over plastic bridge Output: serosanguinous, 100 mls. Ostomy pouching: 2pc. 2 and 3/4 inch pouching system with skin barrier rings Education provided: Extended session with patient and her two children.  Dr. Zella Richer in to see during visit.  Patient also received PO pain medication during visit.  GI A&P taught, also pouch and stoma characteristics.  Demonstration of pouch removal, preparation and application.  Patient taught to perform Lock and Roll closure and is able to give return demo x2. Demonstration of cleaning tail closure with toilet paper "wicks"  provided. Patient taught about wearing pouch in shower as her preference. Discussion regarding pouch emptying frequency and the option of closed end pouches. Discussion regarding pouch change frequency. Patient, son Simona Huh and daughter Gregary Signs provided with ostomy education booklets.  Daughter lives out of town in [place].  Son is local, but cannot look at ostomy. Patient and family questions answered. Enrolled patient in [facility] program: Yes, today.  4 pouches 2 and 3/4 inch, 4 skin barriers 2 and 3/4 inch, 4 rings, 6 closed end pouches 2 and 3/4 inch.  [facility] nursing team will follow, and will remain available to this patient, the nursing, surgery  and medical teams.   Thanks, Maudie Flakes, MSN, RN, [place], Arther Abbott  Pager# [phone]

## 2017-04-29 NOTE — Progress Notes (Signed)
Patient ID: Charlene [name], female   DOB: 1928/01/28, 83 y.o.   MRN: [id number]  [facility] Surgery Progress Note  1 Day Post-Op  Subjective: CC- large bowel obstruction Patient states that she had the best nights sleep last night that she has had in a while. Abdomen sore but not painful. Tolerating few clear liquids. Denies n/v.   Objective: Vital signs in last 24 hours: Temp:  [97.6 F (36.4 C)-98.4 F (36.9 C)] 97.6 F (36.4 C) (07/26 0541) Pulse Rate:  [53-66] 66 (07/26 0541) Resp:  [12-19] 16 (07/26 0541) BP: (132-229)/(53-87) 146/54 (07/26 0541) SpO2:  [92 %-100 %] 97 % (07/26 0541) Weight:  [129 lb (58.5 kg)] 129 lb (58.5 kg) (07/25 1321) Last BM Date: 04/26/17  Intake/Output from previous day: 07/25 0701 - 07/26 0700 In: 3351 [I.V.:3351] Out: 1230 [Urine:1130; Blood:100] Intake/Output this shift: No intake/output data recorded.  PE: Gen:  Alert, NAD, pleasant HEENT: EOM's intact, pupils equal and round Card:  RRR, no M/G/R heard Pulm:  CTAB, no W/R/R, effort normal Abd: Soft, ND, appropriately tender, +BS, colostomy pink with scant bloody drainage in bag/no stool Ext:  No erythema, edema, or tenderness BUE/BLE  Psych: A&Ox3  Skin: no rashes noted, warm and dry  Lab Results:   Recent Labs  04/27/17 0421 04/29/17 0456  WBC 10.0 11.8*  HGB 9.6* 10.2*  HCT 30.0* 31.8*  PLT 318 367   BMET  Recent Labs  04/28/17 0825 04/29/17 0456  NA 134* 133*  K 4.0 4.7  CL 105 106  CO2 20* 16*  GLUCOSE 59* 114*  BUN 15 17  CREATININE 1.16* 1.20*  CALCIUM 8.1* 8.0*   PT/INR No results for input(s): LABPROT, INR in the last 72 hours. CMP     Component Value Date/Time   NA 133 (L) 04/29/2017 0456   NA 136 04/19/2017 1044   K 4.7 04/29/2017 0456   K 4.5 04/19/2017 1044   CL 106 04/29/2017 0456   CO2 16 (L) 04/29/2017 0456   CO2 23 04/19/2017 1044   GLUCOSE 114 (H) 04/29/2017 0456   GLUCOSE 96 04/19/2017 1044   BUN 17 04/29/2017 0456   BUN 19.9  04/19/2017 1044   CREATININE 1.20 (H) 04/29/2017 0456   CREATININE 1.4 (H) 04/19/2017 1044   CALCIUM 8.0 (L) 04/29/2017 0456   CALCIUM 9.7 04/19/2017 1044   PROT 6.3 (L) 04/26/2017 0443   ALBUMIN 3.1 (L) 04/26/2017 0443   AST 21 04/26/2017 0443   ALT 13 (L) 04/26/2017 0443   ALKPHOS 49 04/26/2017 0443   BILITOT 0.7 04/26/2017 0443   GFRNONAA 40 (L) 04/29/2017 0456   GFRAA 46 (L) 04/29/2017 0456   Lipase     Component Value Date/Time   LIPASE 24 04/25/2017 1504       Studies/Results: Dg C-arm 1-60 Min-no Report  Result Date: 04/28/2017 Fluoroscopy was utilized by the requesting physician.  No radiographic interpretation.    Anti-infectives: Anti-infectives    Start     Dose/Rate Route Frequency Ordered Stop   04/28/17 1334  cefoTEtan in Dextrose 5% (CEFOTAN) 2-2.08 GM-% IVPB    Comments:  Tamera Punt, [place]   : cabinet override      04/28/17 1334 04/28/17 1405   04/28/17 1215  cefoTEtan in Dextrose 5% (CEFOTAN) IVPB 2 g     2 g Intravenous On call to O.R. 04/27/17 1255 04/28/17 1420   04/28/17 0600  cefoTEtan (CEFOTAN) 2 g in dextrose 5 % 50 mL IVPB  Status:  Discontinued  2 g 100 mL/hr over 30 Minutes Intravenous On call to O.R. 04/27/17 1233 04/27/17 1255   04/25/17 2100  cefTRIAXone (ROCEPHIN) 1 g in dextrose 5 % 50 mL IVPB  Status:  Discontinued     1 g 100 mL/hr over 30 Minutes Intravenous Every 24 hours 04/25/17 2052 04/27/17 1220       Assessment/Plan Metastatic colon cancer to liver Large bowel obstruction S/p LAPAROSCOPIC ASSISTED DIVERTING  LOOP COLOSTOMY 7/25 Dr. Ninfa Linden - POD 1 - tolerating clears, no output from colostomy yet - WBC 11.8, afebrile  Renal insufficiency/Left hydronephrosis - per urology Anemia - Hg 10.2, stable DVT - on eliquis at home  ID - rocephin 7/22>>7/24. Cefotetan perioperative VTE - heparin FEN - IVF, clear liquids, Boost breeze  Plan - continue clear liquids until return in bowel function. Add boost breeze BID.  Consult PT. Foley per urology.   LOS: 3 days    [place] , [facility] Surgery 04/29/2017, 7:43 AM Pager: [phone] Consults: [phone] Mon-Fri 7:00 am-4:30 pm Sat-Sun 7:00 am-11:30 am

## 2017-04-29 NOTE — NC FL2 (Signed)
[place] LEVEL OF CARE SCREENING TOOL     IDENTIFICATION  Patient Name: Charlene [name] Birthdate: 03-08-1928 Sex: female Admission Date (Current Location): 04/25/2017  [facility] and [place] Number:  Herbalist and Address:  [facility] [place]. [facility], [place] [address] [place], [place] [address]      Provider Number: [id number]  Attending Physician Name and Address:  Eugenie Filler, MD  Relative Name and Phone Number:       Current Level of Care: Hospital Recommended Level of Care: [place] Prior Approval Number:    Date Approved/Denied:   PASRR Number: [id number]  Discharge Plan: SNF    Current Diagnoses: Patient Active Problem List   Diagnosis Date Noted  . Pyuria   . Deep vein thrombosis (DVT) of lower extremity ([place])   . Malignant neoplasm of sigmoid colon ([place])   . Hydronephrosis   . Encounter for palliative care   . Goals of care, counseling/discussion   . Large bowel obstruction ([place]) 04/26/2017  . Abdominal pain 04/25/2017  . UTI (urinary tract infection) 04/25/2017  . Renal insufficiency 04/25/2017  . Anemia 04/25/2017  . Hypertension 04/25/2017  . Pelvic abscess in female 02/02/2017  . Colovesical fistula with cancer s/p colectomy/repair 01/14/2017 01/18/2017  . 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 Protein-calorie malnutrition, severe 02/01/2016  . Depression 01/31/2016  . GERD (gastroesophageal reflux disease) 01/31/2016  . Hemorrhoids 01/31/2016  . Acute diverticulitis 05/05/2015  . Abnormal LFTs 05/05/2015  . Dyslipidemia 03/13/2015  . Leukocytosis 03/13/2015  . Acute renal failure ([place]) 03/13/2015    Orientation RESPIRATION BLADDER Height & Weight     Self, Time, Situation, Place  O2 (2L) Continent, Indwelling catheter Weight: 129 lb (58.5 kg) Height:  5\' 4"  (162.6 cm)  BEHAVIORAL SYMPTOMS/MOOD NEUROLOGICAL BOWEL NUTRITION STATUS   Continent   Colostomy LUQ Diet (currently advancing diet, see DC summary for orders)  AMBULATORY STATUS COMMUNICATION OF NEEDS Skin   Extensive Assist Verbally Surgical wounds, Skin abrasions, Bruising                         Personal Care Assistance Level of Assistance  Bathing, Feeding, Dressing Bathing Assistance: Limited assistance Feeding assistance: Independent Dressing Assistance: Limited assistance     Functional Limitations Info  Sight, Hearing Sight Info: Adequate Hearing Info: Adequate      SPECIAL CARE FACTORS FREQUENCY  PT (By licensed PT), OT (By licensed OT)     PT Frequency: 5x OT Frequency: 5x            Contractures Contractures Info: Not present    Additional Factors Info  Code Status Code Status Info: DNR Allergies Info: NKA           Current Medications (04/29/2017):  This is the current hospital active medication list Current Facility-Administered Medications  Medication Dose Route Frequency Provider Last Rate Last Dose  . 0.9 %  sodium chloride infusion   Intravenous Continuous Dessa Phi Chahn-Yang, DO 100 mL/hr at 04/29/17 0322    . acetaminophen (TYLENOL) tablet 650 mg  650 mg Oral Q6H PRN Jani Gravel, MD       Or  . acetaminophen (TYLENOL) suppository 650 mg  650 mg Rectal Q6H PRN Jani Gravel, MD      . docusate sodium (COLACE) capsule 100 mg  100 mg Oral Daily PRN Jani Gravel, MD   100 mg at 04/25/17 2233  .  famotidine (PEPCID) tablet 10 mg  10 mg Oral QHS PRN Jani Gravel, MD      . feeding supplement (BOOST / RESOURCE BREEZE) liquid 1 Container  1 Container Oral BID BM Meuth, Brooke A, PA-C   1 Container at 04/29/17 1000  . heparin ADULT infusion 100 units/mL (25000 units/226mL sodium chloride 0.45%)  700 Units/hr Intravenous Continuous Thomes Lolling, RPH 7 mL/hr at 04/29/17 1059 700 Units/hr at 04/29/17 1059  . hydrALAZINE (APRESOLINE) injection 10 mg  10 mg Intravenous Q4H PRN Rise Patience, MD   10 mg at 04/28/17  1725  . hydrocortisone (ANUSOL-HC) 2.5 % rectal cream   Rectal QID Dessa Phi Chahn-Yang, DO      . hydroxypropyl methylcellulose / hypromellose (ISOPTO TEARS / GONIOVISC) 2.5 % ophthalmic solution 1 drop  1 drop Both Eyes Daily PRN Jani Gravel, MD      . morphine 4 MG/ML injection 1-3 mg  1-3 mg Intravenous Q1H PRN Coralie Keens, MD      . multivitamin-lutein (OCUVITE-LUTEIN) capsule 1 capsule  1 capsule Oral Daily Jani Gravel, MD   1 capsule at 04/29/17 0956  . ondansetron (ZOFRAN) injection 4 mg  4 mg Intravenous Q6H PRN Jani Gravel, MD   4 mg at 04/26/17 0234  . ondansetron (ZOFRAN) tablet 4 mg  4 mg Oral Q6H PRN Jani Gravel, MD      . oxyCODONE (Oxy IR/ROXICODONE) immediate release tablet 5 mg  5 mg Oral Q4H PRN Micheline Rough, MD   5 mg at 04/29/17 1241  . polyethylene glycol (MIRALAX / GLYCOLAX) packet 17 g  17 g Oral Daily PRN Jani Gravel, MD      . senna-docusate (Senokot-S) tablet 2 tablet  2 tablet Oral Daily Jani Gravel, MD   2 tablet at 04/29/17 [phone]  . sodium phosphate (FLEET) 7-19 GM/118ML enema 1 enema  1 enema Rectal Daily PRN Jani Gravel, MD         Discharge Medications: Please see discharge summary for a list of discharge medications.  Relevant Imaging Results:  Relevant Lab Results:   Additional Information ssn:[id number]   Will need pallaitve care at the facility.  Will have oncology outpatient /  Follow up   [place]illy [place], LCSW

## 2017-04-29 NOTE — Progress Notes (Signed)
Foley irrigated with NS and several small clots noted. Foley is at this time flowing easier and Pt reports less fullness. Will monitor Pt closely.

## 2017-04-29 NOTE — Consult Note (Signed)
Urology Consult Note    Requesting Attending Physician:  Eugenie Filler, MD Service Requesting Consult:  Emergency Department Service Providing Consult: Urology  Consulting Attending: Dr. Nicolette Bang   Assessment:  Patient is a 83 y.o. female with metastatic colon cancer with extensive pelvic disease causing left ureteral obstruction, DVT on eliquis,. Patient presented with fatigue, severe constipation and abdominal pain and is clear she is only interested in interventions which will improve her quality of life.   Interval: She underwent palliative diverting colostomy and attempted left ureteral stent placement yesterday 7/26. Unable to cannulate left ureter and pass a wire beyond UVJ, ureter entirely obstructed. Invasive tumor noted within bladder as well. Patient with foley catheter since surgery, causing gross hematuria and clot obstruction of foley requiring irrigation. Urine culture from 7/22 MUF.   Patient continues to have no explicit need for left collecting system decompression. Am concerned about attempting left ureteral stent placement from percutaneous nephrostomy access. Indwelling ureteral stent will likely exacerbate gross hematuria which may affect patient's overall quality of life. ~Cr 1.2, am hopeful as is the family that palliative chemotherapy can be offered at this renal function without left collecting system decompression.   Recommendations:  1. Remove foley in AM, TOV  2. If patient were to become febrile, or hemodynamically unstable please let urology know  3. Should patient desire chemotherapeutic intervention requiring improved renal function, recommend placement of left percutaneous nephrostomy tube by VIR. Risks and benefits of PCN again discussed with patient and daughter today.    Thank you for this consult, urology will continue to follow. Please contact the urology consult pager with any further questions/concerns.  Jonna Clark, MD Urology  Surgical Resident   Objective   Vital signs in last 24 hours: BP (!) 159/57 (BP Location: Left Arm)   Pulse 68   Temp 98.6 F (37 C) (Oral)   Resp 18   Ht 5\' 4"  (1.626 m)   Wt 58.5 kg (129 lb)   SpO2 94%   BMI 22.14 kg/m   Intake/Output last 3 shifts: I/O last 3 completed shifts: In: 4382.6 [I.V.:4382.6] Out: 1230 [Urine:1130; Blood:100]  Physical Exam General: NAD, A&O, resting, appropriate HEENT: [place]/AT, EOMI, MMM Pulmonary: Normal work of breathing on room air Cardiovascular: HDS, adequate peripheral perfusion Abdomen: soft, NTTP, nondistended, no suprapubic fullness or tenderness GU: no CVA tenderness. Foley catheter with dark merlot colored urine.  Extremities: warm and well perfused, no edema Neuro: Appropriate, no focal neurological deficits  Most Recent Labs: Lab Results  Component Value Date   WBC 11.8 (H) 04/29/2017   HGB 10.2 (L) 04/29/2017   HCT 31.8 (L) 04/29/2017   PLT 367 04/29/2017    Lab Results  Component Value Date   NA 133 (L) 04/29/2017   K 4.7 04/29/2017   CL 106 04/29/2017   CO2 16 (L) 04/29/2017   BUN 17 04/29/2017   CREATININE 1.20 (H) 04/29/2017   CALCIUM 8.0 (L) 04/29/2017   MG 1.7 04/29/2017   PHOS 2.5 02/05/2017    Lab Results  Component Value Date   ALKPHOS 49 04/26/2017   BILITOT 0.7 04/26/2017   BILIDIR <0.1 11/06/2011   PROT 6.3 (L) 04/26/2017   ALBUMIN 3.1 (L) 04/26/2017   ALT 13 (L) 04/26/2017   AST 21 04/26/2017    Lab Results  Component Value Date   INR 1.37 04/26/2017   APTT 132 (H) 04/29/2017     Urine Culture: @[id number] (laburin,org,[id number],[id number])@    IMAGING: Dg C-arm 1-60 Min-no Report  Result Date: 04/28/2017 Fluoroscopy was utilized by the requesting physician.  No radiographic interpretation.

## 2017-04-29 NOTE — Progress Notes (Signed)
LCSW following for disposition of needs: new placement and future planning  LCSW had long conversation at patient and family request regarding long term  Plan and care. Information given along with education regarding payment, rates, and sources. Family has bed information and list provided by previous SW.   Bed of choice: [facility] updated and aware of plans regarding patient admission. Will continue to follow and assist with discharge planning.  Lane Hacker, MSW Clinical Social Work: [employer] Coverage for :  [phone]

## 2017-04-29 NOTE — Progress Notes (Signed)
PROGRESS NOTE    Charlene [name]  SWF:[id number] DOB: [birth year] DOA: 04/25/2017 PCP: Shon Baton, MD    Brief Narrative:  Charlene [name] is a 83 yo female with PMHx of metastatic colon cancer to liver who presents with lower abdominal pain, lack of bowel movement in > 1 week, nausea. CT abdomen and pelvis was completed emergency department, which showed small liver metastases, persistent left hydronephrosis due to ureteral obstruction in the left pelvic retroperitoneum, large bowel obstruction with a large amount of fecal matter proximal to this region. Urology, general surgery, gastroenterology has been consulted. Patient is scheduled for diverting loop colostomy 04/28/2017.    Assessment & Plan:   Principal Problem:   Abdominal pain Active Problems:   UTI (urinary tract infection)   Renal insufficiency   Anemia   Hypertension   Large bowel obstruction (HCC)   Malignant neoplasm of sigmoid colon (HCC)   Hydronephrosis   Encounter for palliative care   Goals of care, counseling/discussion   Pyuria   Deep vein thrombosis (DVT) of lower extremity ([place])  Metastatic colon cancer to the liver /Large bowel obstruction  -GI and general surgery has been consulted. Per GI, colonic stent is not feasible due to tumor length and tortuosity of colon. Patient status post open sigmoidectomy with repair of colovesicular fistula 01/14/2017 per Dr. Marcello Moores.  General surgery/GI recommended diverting colostomy which was done 04/28/2017.. - s/p Enema and digital disimpaction with no significant results. - Patient currently on clears per general surgery until bowels are moving. Per general surgery.  Acute kidney injury secondary to obstruction, with left hydronephrosis and complete left urethral obstruction -Baseline Cr 0.63. Cr 1.20 today. -Urology consulted, no emergent indication for intervention at this point.   - Patient s/p cystoscopy with retrograde pyelogram/ per urology 04/28/2017 which  noted a complete left urethral obstruction and inability to cannulate left urethra and passed Y have beyond the UVJ. 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 Per urology. .  -Trend BMP and avoid nephrotoxins. Cr stable currently. -Monitor I/O  - Outpatient follow-up with urology.  Pyuria -Urine culture with contamination. IV Rocephin discontinued.   Metastatic colon cancer to the liver s/p open sigmoidectomy with repair of colovesicular fistula  -Patient follows with Dr. Benay Spice -Palliative care also following   Anemia of chronic disease -Hgb stable, monitor   DVT -Holding eliquis for now pending surgical intervention. Started heparin gtt, which was held 6 hours prior to surgery. Heparin drip has been presumed to have a patient with some blood in ostomy site, old blood noted in Foley catheter. Flush Foley and continue IV heparin with level at lower end of therapeutic goal.     DVT prophylaxis: SCD/heparin drip Code Status: DO NOT RESUSCITATE Family Communication: Updated patient and family at bedside. Disposition Plan: Pending consultative service and surgical intervention and per general surgery.   Consultants:   Palliative care Dr. Domingo Cocking 04/25/2017  Urology Dr. Alyson Ingles 04/25/2017  Gen. surgery Dr. Excell Seltzer 04/26/2017  Gastroenterology Dr.Danis III 04/26/2017  Wound care  Oncology Dr. Benay Spice 04/27/2017  Procedures:   CT abdomen and pelvis 04/25/2017  Laparoscopic-assisted diverting loop colostomy per Dr. Ninfa Linden general surgery 04/28/2017  Cystourethroscopy, left retrograde pyelogram, intraoperative fluoroscopy with interpretation per Dr. Alinda Money 04/28/2017  Antimicrobials:   IV Rocephin 04/25/2017>>>> 04/27/2017   Subjective: Patient states slept well last night. Passing flatus. No bowel movement. No chest pain.  Objective: Vitals:   04/28/17 1800 04/28/17 2041 04/29/17 0213 04/29/17  0541  BP: (!) 135/54 (!)  132/55 (!) 137/58 (!) 146/54  Pulse: 62 64 63 66  Resp:  18 18 16   Temp:  98.4 F (36.9 C) 98 F (36.7 C) 97.6 F (36.4 C)  TempSrc:  Oral Oral Oral  SpO2:  98% 97% 97%  Weight:      Height:        Intake/Output Summary (Last 24 hours) at 04/29/17 1130 Last data filed at 04/29/17 0900  Gross per 24 hour  Intake          2850.99 ml  Output             1230 ml  Net          1620.99 ml   Filed Weights   04/26/17 0550 04/27/17 0610 04/28/17 1321  Weight: 57.8 kg (127 lb 6.8 oz) 58.6 kg (129 lb 1.6 oz) 58.5 kg (129 lb)    Examination:  General exam: Appears calm and comfortable  Respiratory system: Clear to auscultation anterior lung fields. Respiratory effort normal. Cardiovascular system: S1 & S2 heard, RRR. No JVD, murmurs, rubs, gallops or clicks. No pedal edema. Gastrointestinal system: Abdomen is nondistended, soft and nontender. No organomegaly or masses felt. Normal bowel sounds heard. Ostomy intact with blood in ostomy pouch. Central nervous system: Alert and oriented. No focal neurological deficits. Extremities: Symmetric 5 x 5 power. Skin: No rashes, lesions or ulcers Psychiatry: Judgement and insight appear normal. Mood & affect appropriate.  Genitourinary: Foley catheter in place with dark urine/old blood    Data Reviewed: I have personally reviewed following labs and imaging studies  CBC:  Recent Labs Lab 04/25/17 1504 04/26/17 0443 04/27/17 0421 04/29/17 0456  WBC 11.2* 12.1* 10.0 11.8*  NEUTROABS  --   --  8.0* 11.1*  HGB 11.3* 10.4* 9.6* 10.2*  HCT 34.2* 31.9* 30.0* 31.8*  MCV 80.9 80.6 81.7 81.3  PLT 395 335 318 357   Basic Metabolic Panel:  Recent Labs Lab 04/25/17 1504 04/26/17 0443 04/27/17 0421 04/28/17 0825 04/28/17 0833 04/29/17 0456  NA 135 133* 135 134*  --  133*  K 4.8 4.3 4.1 4.0  --  4.7  CL 100* 101 104 105  --  106  CO2 26 24 20* 20*  --  16*  GLUCOSE 86 94 51* 59*  --  114*  BUN 17 15 18 15   --  17  CREATININE 1.46*  1.20* 1.28* 1.16*  --  1.20*  CALCIUM 9.2 8.5* 8.0* 8.1*  --  8.0*  MG  --   --   --   --  1.8 1.7   GFR: Estimated Creatinine Clearance: 29.1 mL/min (A) (by C-G formula based on SCr of 1.2 mg/dL (H)). Liver Function Tests:  Recent Labs Lab 04/25/17 1504 04/26/17 0443  AST 35 21  ALT 15 13*  ALKPHOS 57 49  BILITOT 1.2 0.7  PROT 7.5 6.3*  ALBUMIN 3.7 3.1*    Recent Labs Lab 04/25/17 1504  LIPASE 24   No results for input(s): AMMONIA in the last 168 hours. Coagulation Profile:  Recent Labs Lab 04/26/17 0443  INR 1.37   Cardiac Enzymes: No results for input(s): CKTOTAL, CKMB, CKMBINDEX, TROPONINI in the last 168 hours. BNP (last 3 results) No results for input(s): PROBNP in the last 8760 hours. HbA1C: No results for input(s): HGBA1C in the last 72 hours. CBG: No results for input(s): GLUCAP in the last 168 hours. Lipid Profile: No results for input(s): CHOL, HDL, LDLCALC, TRIG,  CHOLHDL, LDLDIRECT in the last 72 hours. Thyroid Function Tests: No results for input(s): TSH, T4TOTAL, FREET4, T3FREE, THYROIDAB in the last 72 hours. Anemia Panel: No results for input(s): VITAMINB12, FOLATE, FERRITIN, TIBC, IRON, RETICCTPCT in the last 72 hours. Sepsis Labs: No results for input(s): PROCALCITON, LATICACIDVEN in the last 168 hours.  Recent Results (from the past 240 hour(s))  Urine culture     Status: Abnormal   Collection Time: 04/25/17 10:46 PM  Result Value Ref Range Status   Specimen Description URINE, CLEAN CATCH  Final   Special Requests NONE  Final   Culture MULTIPLE SPECIES PRESENT, SUGGEST RECOLLECTION (A)  Final   Report Status 04/27/2017 FINAL  Final  Surgical pcr screen     Status: None   Collection Time: 04/27/17  9:18 PM  Result Value Ref Range Status   MRSA, PCR NEGATIVE NEGATIVE Final   Staphylococcus aureus NEGATIVE NEGATIVE Final    Comment:        The Xpert SA Assay (FDA approved for NASAL specimens in patients over 48 years of age), is one  component of a comprehensive surveillance program.  Test performance has been validated by [facility] for patients greater than or equal to 72 year old. It is not intended to diagnose infection nor to guide or monitor treatment.          Radiology Studies: Dg C-arm 1-60 Min-no Report  Result Date: 04/28/2017 Fluoroscopy was utilized by the requesting physician.  No radiographic interpretation.        Scheduled Meds: . feeding supplement  1 Container Oral BID BM  . hydrocortisone   Rectal QID  . multivitamin-lutein  1 capsule Oral Daily  . senna-docusate  2 tablet Oral Daily   Continuous Infusions: . sodium chloride 100 mL/hr at 04/29/17 0322  . heparin 700 Units/hr (04/29/17 1059)     LOS: 3 days    Time spent: 51 mins    THOMPSON,DANIEL, MD [facility] Pager [phone]  If 7PM-7AM, please contact night-coverage [email or web address] Password [email or web address] 04/29/2017, 11:30 AM

## 2017-04-29 NOTE — Progress Notes (Addendum)
Pt was complaining of severe discomfort around foley and pain in her abdomen. RN irrigated multiple times and received a small amount of clots, but mostly clear. Pt stated there was "no relief" and c/o of pain. Pt and pt's family wanted to know why the foley could not come out tonight, but could come out in the morning. RN explained to pt the note said the urologist wrote concerning her foley catheter. Pt and pt's family were adamant on removing this foley at that very moment. RN paged the on-call urologist, Jonna Clark, to make her aware of pt and pt's family's concerns. MD Filippou called back and told RN to bladder scan pt and call general surgery to see their recommendation on removing the foley catheter. General surgeon on call, called RN back and said it was fine with him, but it was urology's domain to remove the catheter. Dr. Shanon Brow notified of general surgeon's response and called RN giving the RN the verbal order to remove the foley catheter and to monitor pt output. Before removal of catheter, RN explained to pt and pt's family that if she does not void, there is a possibility she may need a foley reinserted. Pt understood. Pt has felt some relief from catheter removal. Will continue to monitor pt and pt's output and notify MD as necessary.   Gwenlyn Fudge, RN Erling Conte, RN, BSN

## 2017-04-29 NOTE — Progress Notes (Signed)
IP PROGRESS NOTE  Subjective:   Charlene [name] is alert and reports adequate pain control. She underwent a diverting colostomy and attempt at placement of a left ureter stent yesterday. Dr. Borden was unable to pass a wire passed the left UVJ due to complete obstruction. Tumor was noted in the bladder. Objective: Vital signs in last 24 hours: Blood pressure (!) 146/54, pulse 66, temperature 97.6 F (36.4 C), temperature source Oral, resp. rate 16, height 5' 4" (1.626 m), weight 129 lb (58.5 kg), SpO2 97 %.  Intake/Output from previous day: 07/25 0701 - 07/26 0700 In: 3351 [I.V.:3351] Out: 1230 [Urine:1130; Blood:100]  Physical Exam: Not performed today Lab Results:  Recent Labs  04/27/17 0421 04/29/17 0456  WBC 10.0 11.8*  HGB 9.6* 10.2*  HCT 30.0* 31.8*  PLT 318 367    BMET  Recent Labs  04/28/17 0825 04/29/17 0456  NA 134* 133*  K 4.0 4.7  CL 105 106  CO2 20* 16*  GLUCOSE 59* 114*  BUN 15 17  CREATININE 1.16* 1.20*  CALCIUM 8.1* 8.0*      Medications: I have reviewed the patient's current medications.  Assessment/Plan:  1. Colon cancer, sigmoid, stage IIIB (T4,N1), status post a sigmoid colectomy 01/14/2017 ? MSI-stable, no loss of mismatch repair protein expression ? Positive circumferential resection margin ? Colovesical fistula repaired at the time of surgery ? CT abdomen/pelvis 12/22/2016 with distal sigmoid/proximal rectal mass extending to the apex of the vaginal vault and posterior bladder, no evidence of metastatic disease ? Persistently elevated postoperative CEA ? CT 04/19/2017-new liver metastases, retroperitoneal adenopathy, and recurrent tumor in the pelvis with left ureter obstruction and narrowing of the sigmoid colon ? Diverting colostomy 04/28/2017 2. History of uterine cancer  3.   Diverticulitis  4.   Right lower extremity DVT-on apixaban  5.   Postoperative abscess, status post percutaneous drain, May 2018  6.   Admission  04/25/2017 with sigmoid colon obstruction  7.   Renal insufficiency secondary to obstructive left hydronephrosis  Unable to place a ureter stent 04/28/2017 secondary to complete obstruction at the left UVJ   Charlene [name] appears to be doing well following the diverting colostomy. She remains interested in a trial of palliative chemotherapy. We discussed management of the left hydronephrosis. A stent could not be placed via cystoscopy. She may be a candidate for a percutaneous nephrostomy tube and subsequent anterograde stent placement.  Recommendations: 1. Postoperative care per Dr. Blackman 2. Consult interventional radiology to consider placement of percutaneous nephrostomy tube/later attempt at left ureter stent 3. Please call Oncology as needed. I will schedule outpatient follow-up for approximately 2 weeks.   LOS: 3 days   Gary Sherill, MD   04/29/2017, 7:15 AM  

## 2017-04-30 ENCOUNTER — Inpatient Hospital Stay (HOSPITAL_COMMUNITY): Payer: Medicare Other

## 2017-04-30 DIAGNOSIS — R310 Gross hematuria: Secondary | ICD-10-CM

## 2017-04-30 DIAGNOSIS — I10 Essential (primary) hypertension: Secondary | ICD-10-CM

## 2017-04-30 DIAGNOSIS — D72829 Elevated white blood cell count, unspecified: Secondary | ICD-10-CM

## 2017-04-30 LAB — URINALYSIS, ROUTINE W REFLEX MICROSCOPIC
BILIRUBIN URINE: NEGATIVE
Glucose, UA: NEGATIVE mg/dL
KETONES UR: NEGATIVE mg/dL
LEUKOCYTES UA: NEGATIVE
NITRITE: NEGATIVE
PH: 5 (ref 5.0–8.0)
Protein, ur: 100 mg/dL — AB
SPECIFIC GRAVITY, URINE: 1.008 (ref 1.005–1.030)
Squamous Epithelial / LPF: NONE SEEN

## 2017-04-30 LAB — BASIC METABOLIC PANEL
Anion gap: 7 (ref 5–15)
BUN: 19 mg/dL (ref 6–20)
CO2: 19 mmol/L — ABNORMAL LOW (ref 22–32)
CREATININE: 1.07 mg/dL — AB (ref 0.44–1.00)
Calcium: 7.8 mg/dL — ABNORMAL LOW (ref 8.9–10.3)
Chloride: 107 mmol/L (ref 101–111)
GFR calc Af Amer: 53 mL/min — ABNORMAL LOW (ref >60)
GFR calc non Af Amer: 46 mL/min — ABNORMAL LOW (ref >60)
GLUCOSE: 132 mg/dL — AB (ref 65–99)
POTASSIUM: 3.6 mmol/L (ref 3.5–5.1)
Sodium: 133 mmol/L — ABNORMAL LOW (ref 135–145)

## 2017-04-30 LAB — APTT
APTT: 80 s — AB (ref 24–36)
aPTT: 76 seconds — ABNORMAL HIGH (ref 24–36)

## 2017-04-30 LAB — CBC
HEMATOCRIT: 31.1 % — AB (ref 36.0–46.0)
HEMOGLOBIN: 10.2 g/dL — AB (ref 12.0–15.0)
MCH: 26.1 pg (ref 26.0–34.0)
MCHC: 32.8 g/dL (ref 30.0–36.0)
MCV: 79.5 fL (ref 78.0–100.0)
Platelets: 421 10*3/uL — ABNORMAL HIGH (ref 150–400)
RBC: 3.91 MIL/uL (ref 3.87–5.11)
RDW: 17.7 % — ABNORMAL HIGH (ref 11.5–15.5)
WBC: 21 10*3/uL — AB (ref 4.0–10.5)

## 2017-04-30 LAB — PREALBUMIN: PREALBUMIN: 10.4 mg/dL — AB (ref 18–38)

## 2017-04-30 LAB — HEPARIN LEVEL (UNFRACTIONATED): Heparin Unfractionated: 0.75 IU/mL — ABNORMAL HIGH (ref 0.30–0.70)

## 2017-04-30 MED ORDER — BELLADONNA ALKALOIDS-OPIUM 16.2-60 MG RE SUPP
1.0000 | Freq: Once | RECTAL | Status: AC
  Administered 2017-04-30: 1 via RECTAL
  Filled 2017-04-30: qty 1

## 2017-04-30 MED ORDER — METHOCARBAMOL 500 MG PO TABS
500.0000 mg | ORAL_TABLET | Freq: Once | ORAL | Status: AC
  Administered 2017-04-30: 500 mg via ORAL
  Filled 2017-04-30: qty 1

## 2017-04-30 MED ORDER — METHOCARBAMOL 1000 MG/10ML IJ SOLN
500.0000 mg | Freq: Three times a day (TID) | INTRAVENOUS | Status: DC | PRN
  Administered 2017-04-30 – 2017-05-01 (×2): 500 mg via INTRAVENOUS
  Filled 2017-04-30: qty 550
  Filled 2017-04-30 (×3): qty 5

## 2017-04-30 MED ORDER — FUROSEMIDE 10 MG/ML IJ SOLN
40.0000 mg | Freq: Once | INTRAMUSCULAR | Status: AC
  Administered 2017-04-30: 40 mg via INTRAVENOUS
  Filled 2017-04-30: qty 4

## 2017-04-30 NOTE — Progress Notes (Signed)
Patient ID: Charlene [name], female   DOB: [birth year], 83 y.o.   MRN: [id number]  [facility] Surgery Progress Note  2 Days Post-Op  Subjective: CC- large bowel obstruction Patient states that she had a rough night. She was having pain with foley catheter therefore it was removed, now she is having issues with urinary retention. She continues to have pain/spasms in her bladder. She is passing clots from her bladder. No dysuria. Urine is dark. Tolerated few clear liquids yesterday. She had one small episode of emesis, and she is burping more today. No stool from ostomy.  Objective: Vital signs in last 24 hours: Temp:  [97.9 F (36.6 C)-98.6 F (37 C)] 98.3 F (36.8 C) (07/27 0443) Pulse Rate:  [61-76] 71 (07/27 0443) Resp:  [18] 18 (07/27 0443) BP: (159-197)/(55-65) 168/61 (07/27 0443) SpO2:  [92 %-98 %] 92 % (07/27 0443) Last BM Date:  (has colostomy )  Intake/Output from previous day: 07/26 0701 - 07/27 0700 In: 3280 [P.O.:1020; I.V.:2260] Out: 510 [Urine:490; Stool:20] Intake/Output this shift: No intake/output data recorded.  PE: Gen:  Alert, NAD, pleasant HEENT: EOM's intact, pupils equal and round Card:  RRR, no M/G/R heard Pulm:  CTAB, no W/R/R, effort normal Abd: Soft, mild distension, appropriately tender, +BS, colostomy pink/less edematous with scant bloody drainage in bag/no stool Ext:  No erythema, edema, or tenderness BUE/BLE  Psych: A&Ox3  Skin: no rashes noted, warm and dry  Lab Results:   Recent Labs  04/29/17 0456 04/30/17 0435  WBC 11.8* 21.0*  HGB 10.2* 10.2*  HCT 31.8* 31.1*  PLT 367 421*   BMET  Recent Labs  04/29/17 0456 04/30/17 0435  NA 133* 133*  K 4.7 3.6  CL 106 107  CO2 16* 19*  GLUCOSE 114* 132*  BUN 17 19  CREATININE 1.20* 1.07*  CALCIUM 8.0* 7.8*   PT/INR No results for input(s): LABPROT, INR in the last 72 hours. CMP     Component Value Date/Time   NA 133 (L) 04/30/2017 0435   NA 136 04/19/2017 1044   K 3.6  04/30/2017 0435   K 4.5 04/19/2017 1044   CL 107 04/30/2017 0435   CO2 19 (L) 04/30/2017 0435   CO2 23 04/19/2017 1044   GLUCOSE 132 (H) 04/30/2017 0435   GLUCOSE 96 04/19/2017 1044   BUN 19 04/30/2017 0435   BUN 19.9 04/19/2017 1044   CREATININE 1.07 (H) 04/30/2017 0435   CREATININE 1.4 (H) 04/19/2017 1044   CALCIUM 7.8 (L) 04/30/2017 0435   CALCIUM 9.7 04/19/2017 1044   PROT 6.3 (L) 04/26/2017 0443   ALBUMIN 3.1 (L) 04/26/2017 0443   AST 21 04/26/2017 0443   ALT 13 (L) 04/26/2017 0443   ALKPHOS 49 04/26/2017 0443   BILITOT 0.7 04/26/2017 0443   GFRNONAA 46 (L) 04/30/2017 0435   GFRAA 53 (L) 04/30/2017 0435   Lipase     Component Value Date/Time   LIPASE 24 04/25/2017 1504       Studies/Results: Dg C-arm 1-60 Min-no Report  Result Date: 04/28/2017 Fluoroscopy was utilized by the requesting physician.  No radiographic interpretation.    Anti-infectives: Anti-infectives    Start     Dose/Rate Route Frequency Ordered Stop   04/28/17 1334  cefoTEtan in Dextrose 5% (CEFOTAN) 2-2.08 GM-% IVPB    Comments:  Tamera Punt, [place]   : cabinet override      04/28/17 1334 04/28/17 1405   04/28/17 1215  cefoTEtan in Dextrose 5% (CEFOTAN) IVPB 2 g  2 g Intravenous On call to O.R. 04/27/17 1255 04/28/17 1420   04/28/17 0600  cefoTEtan (CEFOTAN) 2 g in dextrose 5 % 50 mL IVPB  Status:  Discontinued     2 g 100 mL/hr over 30 Minutes Intravenous On call to O.R. 04/27/17 1233 04/27/17 1255   04/25/17 2100  cefTRIAXone (ROCEPHIN) 1 g in dextrose 5 % 50 mL IVPB  Status:  Discontinued     1 g 100 mL/hr over 30 Minutes Intravenous Every 24 hours 04/25/17 2052 04/27/17 1220       Assessment/Plan Metastatic colon cancer to liver Large bowel obstruction S/p LAPAROSCOPIC ASSISTED DIVERTING LOOP COLOSTOMY 7/25 Dr. Ninfa Linden - POD 2 - awaiting return in bowel function - WBC up to 21, afebrile  Renal insufficiency/Left hydronephrosis - per urology Anemia - Hg 10.2, stable DVT -  on eliquis at home  ID - rocephin 7/22>>7/24. Cefotetan perioperative VTE - heparin. Ok to restart eliquis from surgical standpoint FEN - IVF, clear liquids, Boost breeze. Check prealbumin, last meal was 7/21  Plan - continue clear liquids until return in bowel function. Continue PT/mobilization. Discussed with urology and will plan to replace large (22 or 24French) catheter. Continue robaxin PRN muscle spasms. Check urine culture due to elevated WBC.   LOS: 4 days    Wellington Hampshire , [facility] Surgery 04/30/2017, 7:50 AM Pager: [phone] Consults: [phone] Mon-Fri 7:00 am-4:30 pm Sat-Sun 7:00 am-11:30 am

## 2017-04-30 NOTE — Progress Notes (Signed)
PT Cancellation Note  Patient Details Name: Charlene [name] MRN: [id number] DOB: [birth year]   Cancelled Treatment:    Reason Eval/Treat Not Completed: Fatigue/lethargy limiting ability to participate. Patient reports just beginning to feel a little better but not ready to move around. Will return tomorrow.   [place] PT [phone]  [name] 04/30/2017, 1:53 PM

## 2017-04-30 NOTE — Progress Notes (Signed)
Pt voided around 0300 about 19ml (red but clear). Pt states "hurts to sit for too long or to try and pee." Bladder scanned patient after voiding which showed around 253ml in bladder. Pt states that she's not in as much pain as she was before with the foley catheter in. Made MD on call aware, she said that was fine and to let her sleep. She said patient has colon cancer so she will be experiencing some pain. RN's will continue to encourage patient to ambulate and try to void along with monitoring patients output and any signs of bleeding.   Erling Conte, RN BSN  Gwenlyn Fudge, RN

## 2017-04-30 NOTE — Progress Notes (Signed)
PT Cancellation Note  Patient Details Name: Charlene [name] MRN: [id number] DOB: 10-25-1927   Cancelled Treatment:    Reason Eval/Treat Not Completed: Patient at procedure or test/unavailablewill check back later time.   [name] 04/30/2017, 8:48 AM [name] PT [phone]

## 2017-04-30 NOTE — Care Management Important Message (Signed)
Important Message  Patient Details  Name: JETAIME PINNIX MRN: [id number] Date of Birth: [birth year]   Medicare Important Message Given:  Yes    Kerin Salen 04/30/2017, 10:25 [place] Message  Patient Details  Name: CHRISTEAN SILVESTRI MRN: [id number] Date of Birth: [birth year]   Medicare Important Message Given:  Yes    Kerin Salen 04/30/2017, 10:25 AM

## 2017-04-30 NOTE — Progress Notes (Signed)
OT Cancellation Note  Patient Details Name: Charlene [name] MRN: [id number] DOB: 13-Dec-1927   Cancelled Treatment:    Reason Eval/Treat Not Completed: Fatigue/lethargy limiting ability to participate. Will check back tomorrow.  Vergil Burby 04/30/2017, 1:57 PM  Lesle Chris, OTR/L [phone] 04/30/2017

## 2017-04-30 NOTE — Progress Notes (Addendum)
ANTICOAGULATION CONSULT NOTE  - Brief Note (aPTT follow-up)  Pharmacy Consult for heparin Indication: DVT - bridge while off apixaban for surgery  Refer to pharmacist's note this morning for full details:  7/27 13:00 aPTT = 80 sec(therapeutic on 700 units/hr) -Ongoing hematuria noted. Hgb stable  Goal of Therapy:  Heparin level 0.3-0.7 aPTT 66-102 seconds Monitor platelets by anticoagulation protocol: Yes  Plan:  Continue heparin at current rate (goal is lower end d/t hematuria)  Monitor for worsening bleeding  Labs in am as ordered  Doreene Eland, PharmD, BCPS.   Pager: [phone] 04/30/2017 2:09 PM

## 2017-04-30 NOTE — Progress Notes (Signed)
PROGRESS NOTE    Charlene [name]  ION:[id number] DOB: [birth year] DOA: 04/25/2017 PCP: Shon Baton, MD    Brief Narrative:  Charlene [name] is a 83 yo female with PMHx of metastatic colon cancer to liver who presents with lower abdominal pain, lack of bowel movement in > 1 week, nausea. CT abdomen and pelvis was completed emergency department, which showed small liver metastases, persistent left hydronephrosis due to ureteral obstruction in the left pelvic retroperitoneum, large bowel obstruction with a large amount of fecal matter proximal to this region. Urology, general surgery, gastroenterology has been consulted. Patient is scheduled for diverting loop colostomy 04/28/2017.    Assessment & Plan:   Principal Problem:   Abdominal pain Active Problems:   UTI (urinary tract infection)   Renal insufficiency   Anemia   Hypertension   Large bowel obstruction (HCC)   Malignant neoplasm of sigmoid colon (HCC)   Hydronephrosis   Encounter for palliative care   Goals of care, counseling/discussion   Pyuria   Deep vein thrombosis (DVT) of lower extremity ([place])  Metastatic colon cancer to the liver /Large bowel obstruction  -GI and general surgery has been consulted. Per GI, colonic stent is not feasible due to tumor length and tortuosity of colon. Patient status post open sigmoidectomy with repair of colovesicular fistula 01/14/2017 per Dr. Marcello Moores.  General surgery/GI recommended diverting colostomy which was done 04/28/2017.. - s/p Enema and digital disimpaction with no significant results. - Patient currently on clears per general surgery until bowels are moving. Per general surgery.  Acute kidney injury secondary to obstruction, with left hydronephrosis and complete left urethral obstruction -Baseline Cr 0.63. Cr 1.20 today. -Urology consulted, no emergent indication for intervention at this point.   - Patient s/p cystoscopy with retrograde pyelogram/ per urology 04/28/2017 which  noted a complete left urethral obstruction and inability to cannulate left urethra and passed Y have beyond the UVJ. 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 Per urology. .  -Trend BMP and avoid nephrotoxins. Cr stable currently. -Monitor I/O  - Outpatient follow-up with urology.  Pyuria -Urine culture with contamination. IV Rocephin discontinued.   Metastatic colon cancer to the liver s/p open sigmoidectomy with repair of colovesicular fistula  -Patient follows with Dr. Benay Spice -Palliative care also following   Anemia of chronic disease -Hgb stable, monitor   DVT -Held eliquis pending surgical intervention. Started heparin gtt, which was held 6 hours prior to surgery. Heparin drip has been presumed to have a patient with some blood in ostomy site, old blood noted in Foley catheter. Flush Foley and continue IV heparin with level at lower end of therapeutic goal until hematuria has resolved and they could transition back to home regimen of Levaquin.   Gross hematuria Patient noted to have gross hematuria late yesterday afternoon. Foley catheter was flushed and small clots noted. Foley catheter was subsequently removed overnight due to patient's complaints of significant lower abdominal pain and spasms. Larger Foley catheter to be placed today and flushed. Urology following.  Leukocytosis Patient with a worsening leukocytosis. Urinalysis pending. Check a chest x-ray. Check Blood cultures x 2.    DVT prophylaxis: SCD/heparin drip Code Status: DO NOT RESUSCITATE Family Communication: Updated patient and family at bedside. Disposition Plan: Pending consultative service and surgical intervention and per general surgery.   Consultants:   Palliative care Dr. Domingo Cocking 04/25/2017  Urology Dr. Alyson Ingles 04/25/2017  Gen. surgery Dr. Excell Seltzer 04/26/2017  Gastroenterology Dr.Danis III 04/26/2017  Wound care  Oncology Dr. Benay Spice  04/27/2017  Procedures:   CT abdomen and pelvis 04/25/2017  Laparoscopic-assisted diverting loop colostomy per Dr. Ninfa Linden general surgery 04/28/2017  Cystourethroscopy, left retrograde pyelogram, intraoperative fluoroscopy with interpretation per Dr. Alinda Money 04/28/2017  Antimicrobials:   IV Rocephin 04/25/2017>>>> 04/27/2017   Subjective: Patient states had a miserable night last night. Patient complaining of lower abdominal pain and cramping. Foley catheter was removed overnight as patient could not stand the pain. Prior to Foley catheter be removed patient was noted to have some gross hematuria. Passing flatus. No bowel movement.   Objective: Vitals:   04/29/17 1800 04/29/17 2046 04/29/17 2249 04/30/17 0443  BP: (!) 159/57 (!) 197/64 (!) 160/65 (!) 168/61  Pulse: 68 61 76 71  Resp: 18 18  18   Temp: 98.6 F (37 C) 97.9 F (36.6 C)  98.3 F (36.8 C)  TempSrc: Oral Oral  Oral  SpO2: 94% 98%  92%  Weight:      Height:        Intake/Output Summary (Last 24 hours) at 04/30/17 1000 Last data filed at 04/30/17 1610  Gross per 24 hour  Intake          2979.95 ml  Output              510 ml  Net          2469.95 ml   Filed Weights   04/26/17 0550 04/27/17 0610 04/28/17 1321  Weight: 57.8 kg (127 lb 6.8 oz) 58.6 kg (129 lb 1.6 oz) 58.5 kg (129 lb)    Examination:  General exam: Appears calm and comfortable  Respiratory system: Clear to auscultation anterior lung fields. Respiratory effort normal. Cardiovascular system: S1 & S2 heard, RRR. No JVD, murmurs, rubs, gallops or clicks. No pedal edema. Gastrointestinal system: Abdomen is nondistended, soft and nontender. No organomegaly or masses felt. Normal bowel sounds heard. Ostomy intact with blood in ostomy pouch. Central nervous system: Alert and oriented. No focal neurological deficits. Extremities: Symmetric 5 x 5 power. Skin: No rashes, lesions or ulcers Psychiatry: Judgement and insight appear normal. Mood & affect  appropriate.  Genitourinary: Foley catheter in place with dark urine/old blood    Data Reviewed: I have personally reviewed following labs and imaging studies  CBC:  Recent Labs Lab 04/25/17 1504 04/26/17 0443 04/27/17 0421 04/29/17 0456 04/30/17 0435  WBC 11.2* 12.1* 10.0 11.8* 21.0*  NEUTROABS  --   --  8.0* 11.1*  --   HGB 11.3* 10.4* 9.6* 10.2* 10.2*  HCT 34.2* 31.9* 30.0* 31.8* 31.1*  MCV 80.9 80.6 81.7 81.3 79.5  PLT 395 335 318 367 960*   Basic Metabolic Panel:  Recent Labs Lab 04/26/17 0443 04/27/17 0421 04/28/17 0825 04/28/17 0833 04/29/17 0456 04/30/17 0435  NA 133* 135 134*  --  133* 133*  K 4.3 4.1 4.0  --  4.7 3.6  CL 101 104 105  --  106 107  CO2 24 20* 20*  --  16* 19*  GLUCOSE 94 51* 59*  --  114* 132*  BUN 15 18 15   --  17 19  CREATININE 1.20* 1.28* 1.16*  --  1.20* 1.07*  CALCIUM 8.5* 8.0* 8.1*  --  8.0* 7.8*  MG  --   --   --  1.8 1.7  --    GFR: Estimated Creatinine Clearance: 32.6 mL/min (A) (by C-G formula based on SCr of 1.07 mg/dL (H)). Liver Function Tests:  Recent Labs Lab 04/25/17 1504 04/26/17  0443  AST 35 21  ALT 15 13*  ALKPHOS 57 49  BILITOT 1.2 0.7  PROT 7.5 6.3*  ALBUMIN 3.7 3.1*    Recent Labs Lab 04/25/17 1504  LIPASE 24   No results for input(s): AMMONIA in the last 168 hours. Coagulation Profile:  Recent Labs Lab 04/26/17 0443  INR 1.37   Cardiac Enzymes: No results for input(s): CKTOTAL, CKMB, CKMBINDEX, TROPONINI in the last 168 hours. BNP (last 3 results) No results for input(s): PROBNP in the last 8760 hours. HbA1C: No results for input(s): HGBA1C in the last 72 hours. CBG: No results for input(s): GLUCAP in the last 168 hours. Lipid Profile: No results for input(s): CHOL, HDL, LDLCALC, TRIG, CHOLHDL, LDLDIRECT in the last 72 hours. Thyroid Function Tests: No results for input(s): TSH, T4TOTAL, FREET4, T3FREE, THYROIDAB in the last 72 hours. Anemia Panel: No results for input(s): VITAMINB12,  FOLATE, FERRITIN, TIBC, IRON, RETICCTPCT in the last 72 hours. Sepsis Labs: No results for input(s): PROCALCITON, LATICACIDVEN in the last 168 hours.  Recent Results (from the past 240 hour(s))  Urine culture     Status: Abnormal   Collection Time: 04/25/17 10:46 PM  Result Value Ref Range Status   Specimen Description URINE, CLEAN CATCH  Final   Special Requests NONE  Final   Culture MULTIPLE SPECIES PRESENT, SUGGEST RECOLLECTION (A)  Final   Report Status 04/27/2017 FINAL  Final  Surgical pcr screen     Status: None   Collection Time: 04/27/17  9:18 PM  Result Value Ref Range Status   MRSA, PCR NEGATIVE NEGATIVE Final   Staphylococcus aureus NEGATIVE NEGATIVE Final    Comment:        The Xpert SA Assay (FDA approved for NASAL specimens in patients over 36 years of age), is one component of a comprehensive surveillance program.  Test performance has been validated by [facility] for patients greater than or equal to 78 year old. It is not intended to diagnose infection nor to guide or monitor treatment.          Radiology Studies: Dg Chest 2 View  Result Date: 04/30/2017 CLINICAL DATA:  Leukocytosis. EXAM: CHEST  2 VIEW COMPARISON:  CT the abdomen and pelvis 04/19/2017 FINDINGS: The heart is mildly enlarged. Atherosclerotic changes are present at the aortic arch. Left greater than right pleural effusions and basilar airspace disease is present. The upper lung fields are clear. The lungs are mildly hyperinflated. Mild pulmonary vascular congestion is present. The visualized soft tissues and bony thorax are unremarkable. IMPRESSION: 1. Left greater than right pleural effusion and airspace disease. While this may reflect atelectasis, infection is not excluded. 2. Cardiomegaly and mild pulmonary vascular congestion. Electronically Signed   By: San Morelle M.D.   On: 04/30/2017 08:41   Dg C-arm 1-60 Min-no Report  Result Date: 04/28/2017 Fluoroscopy was utilized by the  requesting physician.  No radiographic interpretation.        Scheduled Meds: . feeding supplement  1 Container Oral BID BM  . hydrocortisone   Rectal QID  . multivitamin-lutein  1 capsule Oral Daily  . opium-belladonna  1 suppository Rectal Once  . senna-docusate  2 tablet Oral Daily   Continuous Infusions: . sodium chloride 100 mL/hr at 04/30/17 0635  . heparin 700 Units/hr (04/29/17 1941)  . methocarbamol (ROBAXIN)  IV       LOS: 4 days    Time spent: 79 mins    THOMPSON,DANIEL, MD [facility] Pager [phone]  If 7PM-7AM, please contact night-coverage [email or web address] Password [email or web address] 04/30/2017, 10:00 AM

## 2017-04-30 NOTE — Progress Notes (Signed)
Charlene [name] for heparin Indication: DVT - bridge while off apixaban for surgery  No Known Allergies  Patient Measurements: Height: 5\' 4"  (162.6 cm) Weight: 129 lb (58.5 kg) IBW/kg (Calculated) : 54.7 Heparin Dosing Weight: 59kg  Vital Signs: Temp: 98.3 F (36.8 C) (07/27 0443) Temp Source: Oral (07/27 0443) BP: 168/61 (07/27 0443) Pulse Rate: 71 (07/27 0443)  Labs:  Recent Labs  04/27/17 2029 04/28/17 0825 04/29/17 0456 04/29/17 0753 04/30/17 0435  HGB  --   --  10.2*  --  10.2*  HCT  --   --  31.8*  --  31.1*  PLT  --   --  367  --  421*  APTT 127*  --   --  132* 76*  HEPARINUNFRC 1.50*  --   --  0.94* 0.75*  CREATININE  --  1.16* 1.20*  --  1.07*    Estimated Creatinine Clearance: 32.6 mL/min (A) (by C-G formula based on SCr of 1.07 mg/dL (H)).   Medical History: Past Medical History:  Diagnosis Date  . Acute renal failure ([place])   . Allergy    seasonal  . BPPV (benign paroxysmal positional vertigo) 2012  . Colon cancer metastasized to liver ([place])   . Depression   . Diverticulitis   . Dyslipidemia   . GERD (gastroesophageal reflux disease)   . Hemorrhoids   . Hypertension 04/25/2017  . Macular degeneration   . Renal insufficiency 04/25/2017  . Urine frequency   . Uterine carcinoma (HCC)     Assessment: 36 YOF with a large bowel obstruction with a history of metastatic colon cancer.  She has a history of DVT on apixaban 2.5mg  BID (dose reduced due to bleeding, advanced age & decreased renal function).  Apixaban on hold since patient going to OR 7/25 and bridging with IV heparin.   Last dose of apixaban was 7/22 at 0930  Baseline aPTT = 41 sec,  HL = 1.46 (falsely elevated due to effects of apixaban),  INR = 1.37  04/30/2017:   Heparin infusion at 700 units/hr   APTT = 76 sec (therapeutic) and Heparin level = 0.75 ( still elevated from apixaban effects, but trending down)  Hg low but stable, Pltc  WNL  Bleeding from foley catheter yesterday causing heparin to be held is noted.  Patient states urine is still pink-tinged since heparin drip resumed, but not dark red like yesterday..   No infusion related issues reported  Goal of Therapy:  Heparin level 0.3-0.7 aPTT 66-102 seconds Monitor platelets by anticoagulation protocol: Yes   Plan:   Continue heparin infusion at 700 units/hr  Recheck aPTT in 8 hr to confirm rate  Daily CBC, aPTT and heparin level - once levels correlate, can monitor heparin levels only  Follow up plans for transition back to apixaban once invasive procedures completed- IR consult pending  [name], PharmD, BCPS Pager: [phone] 04/30/2017 7:47 AM

## 2017-05-01 ENCOUNTER — Inpatient Hospital Stay (HOSPITAL_COMMUNITY): Payer: Medicare Other

## 2017-05-01 DIAGNOSIS — J9811 Atelectasis: Secondary | ICD-10-CM

## 2017-05-01 LAB — BASIC METABOLIC PANEL
ANION GAP: 8 (ref 5–15)
BUN: 15 mg/dL (ref 6–20)
CALCIUM: 7.9 mg/dL — AB (ref 8.9–10.3)
CHLORIDE: 103 mmol/L (ref 101–111)
CO2: 22 mmol/L (ref 22–32)
Creatinine, Ser: 1.14 mg/dL — ABNORMAL HIGH (ref 0.44–1.00)
GFR calc non Af Amer: 42 mL/min — ABNORMAL LOW (ref >60)
GFR, EST AFRICAN AMERICAN: 49 mL/min — AB (ref >60)
Glucose, Bld: 114 mg/dL — ABNORMAL HIGH (ref 65–99)
POTASSIUM: 3.4 mmol/L — AB (ref 3.5–5.1)
Sodium: 133 mmol/L — ABNORMAL LOW (ref 135–145)

## 2017-05-01 LAB — URINE CULTURE

## 2017-05-01 LAB — HEPARIN LEVEL (UNFRACTIONATED)
Heparin Unfractionated: 0.91 IU/mL — ABNORMAL HIGH (ref 0.30–0.70)
Heparin Unfractionated: 0.61 IU/mL (ref 0.30–0.70)

## 2017-05-01 LAB — CBC
HCT: 26.7 % — ABNORMAL LOW (ref 36.0–46.0)
HEMOGLOBIN: 9 g/dL — AB (ref 12.0–15.0)
MCH: 26.7 pg (ref 26.0–34.0)
MCHC: 33.7 g/dL (ref 30.0–36.0)
MCV: 79.2 fL (ref 78.0–100.0)
Platelets: 356 10*3/uL (ref 150–400)
RBC: 3.37 MIL/uL — AB (ref 3.87–5.11)
RDW: 18 % — ABNORMAL HIGH (ref 11.5–15.5)
WBC: 18.5 10*3/uL — ABNORMAL HIGH (ref 4.0–10.5)

## 2017-05-01 LAB — APTT
APTT: 103 s — AB (ref 24–36)
APTT: 28 s (ref 24–36)

## 2017-05-01 MED ORDER — BELLADONNA ALKALOIDS-OPIUM 16.2-60 MG RE SUPP
1.0000 | Freq: Three times a day (TID) | RECTAL | Status: DC | PRN
  Administered 2017-05-01: 1 via RECTAL
  Filled 2017-05-01: qty 1

## 2017-05-01 MED ORDER — SIMETHICONE 80 MG PO CHEW: 80.0000 mg | CHEWABLE_TABLET | Freq: Four times a day (QID) | ORAL | Status: DC | PRN

## 2017-05-01 MED ORDER — HEPARIN (PORCINE) IN NACL 100-0.45 UNIT/ML-% IJ SOLN
500.0000 [IU]/h | INTRAMUSCULAR | Status: DC
  Administered 2017-05-01: 600 [IU]/h via INTRAVENOUS
  Administered 2017-05-02: 650 [IU]/h via INTRAVENOUS
  Administered 2017-05-04 – 2017-05-06 (×2): 450 [IU]/h via INTRAVENOUS
  Filled 2017-05-01 (×3): qty 250

## 2017-05-01 MED ORDER — POTASSIUM CHLORIDE CRYS ER 20 MEQ PO TBCR
20.0000 meq | EXTENDED_RELEASE_TABLET | Freq: Two times a day (BID) | ORAL | Status: AC
  Administered 2017-05-01 – 2017-05-02 (×4): 20 meq via ORAL
  Filled 2017-05-01 (×4): qty 1

## 2017-05-01 NOTE — Progress Notes (Signed)
PT Cancellation Note  Patient Details Name: Charlene [name] MRN: [id number] DOB: [birth year]   Cancelled Treatment:     PT order received but eval deferred at request of RN 2* pt's elevated pain level.  Will follow.   Devone Tousley 05/01/2017, 3:01 PM

## 2017-05-01 NOTE — Progress Notes (Signed)
OT Cancellation Note  Patient Details Name: Charlene [name] MRN: [id number] DOB: 01-31-1928   Cancelled Treatment:    Reason Eval/Treat Not Completed: Pain limiting ability to participate. Will check back, likely tomorrow.  Eran Mistry 05/01/2017, 2:23 PM  Lesle Chris, OTR/L [phone] 05/01/2017

## 2017-05-01 NOTE — Progress Notes (Signed)
3 Days Post-Op   Subjective/Chief Complaint: Having bladder spasms but they are improved. Also having gas pain. No real stool thru colostomy yet. The sugary liquids and resource breeze makes her nauseous. Other liquids going ok.    Objective: Vital signs in last 24 hours: Temp:  [98.1 F (36.7 C)-98.2 F (36.8 C)] 98.1 F (36.7 C) (07/28 0519) Pulse Rate:  [64-73] 64 (07/27 2102) Resp:  [18-20] 18 (07/28 0519) BP: (147-168)/(61-66) 168/62 (07/28 0519) SpO2:  [91 %-92 %] 91 % (07/28 0519) Last BM Date:  (has colostomy )  Intake/Output from previous day: 07/27 0701 - 07/28 0700 In: 1656.7 [P.O.:240; I.V.:1361.7; IV Piggyback:55] Out: 2500 [Urine:2500] Intake/Output this shift: No intake/output data recorded.  Alert, nontoxic cta b/l Reg Distension, incisions ok. A little tympanic; ostomy - hyperemic but viable, no air in bag +SCDs, no edema  Lab Results:   Recent Labs  04/30/17 0435 05/01/17 0502  WBC 21.0* 18.5*  HGB 10.2* 9.0*  HCT 31.1* 26.7*  PLT 421* 356   BMET  Recent Labs  04/30/17 0435 05/01/17 0502  NA 133* 133*  K 3.6 3.4*  CL 107 103  CO2 19* 22  GLUCOSE 132* 114*  BUN 19 15  CREATININE 1.07* 1.14*  CALCIUM 7.8* 7.9*   PT/INR No results for input(s): LABPROT, INR in the last 72 hours. ABG No results for input(s): PHART, HCO3 in the last 72 hours.  Invalid input(s): PCO2, PO2  Studies/Results: Dg Chest 2 View  Result Date: 04/30/2017 CLINICAL DATA:  Leukocytosis. EXAM: CHEST  2 VIEW COMPARISON:  CT the abdomen and pelvis 04/19/2017 FINDINGS: The heart is mildly enlarged. Atherosclerotic changes are present at the aortic arch. Left greater than right pleural effusions and basilar airspace disease is present. The upper lung fields are clear. The lungs are mildly hyperinflated. Mild pulmonary vascular congestion is present. The visualized soft tissues and bony thorax are unremarkable. IMPRESSION: 1. Left greater than right pleural effusion and  airspace disease. While this may reflect atelectasis, infection is not excluded. 2. Cardiomegaly and mild pulmonary vascular congestion. Electronically Signed   By: San Morelle M.D.   On: 04/30/2017 08:41    Anti-infectives: Anti-infectives    Start     Dose/Rate Route Frequency Ordered Stop   04/28/17 1334  cefoTEtan in Dextrose 5% (CEFOTAN) 2-2.08 GM-% IVPB    Comments:  Tamera Punt, [place]   : cabinet override      04/28/17 1334 04/28/17 1405   04/28/17 1215  cefoTEtan in Dextrose 5% (CEFOTAN) IVPB 2 g     2 g Intravenous On call to O.R. 04/27/17 1255 04/28/17 1420   04/28/17 0600  cefoTEtan (CEFOTAN) 2 g in dextrose 5 % 50 mL IVPB  Status:  Discontinued     2 g 100 mL/hr over 30 Minutes Intravenous On call to O.R. 04/27/17 1233 04/27/17 1255   04/25/17 2100  cefTRIAXone (ROCEPHIN) 1 g in dextrose 5 % 50 mL IVPB  Status:  Discontinued     1 g 100 mL/hr over 30 Minutes Intravenous Every 24 hours 04/25/17 2052 04/27/17 1220      Assessment/Plan: s/p Procedure(s) with comments: LAPAROSCOPIC ASSISTED DIVERTING  LOOP COLOSTOMY (N/A) CYSTOSCOPY WITH LEFT RETROGRADE PYELOGRAM/ LEFTURETERAL STENT PLACEMENT (Left) - MATURED THE STOMA Metastatic colon cancer to liver Large bowel obstruction S/p LAPAROSCOPIC ASSISTED DIVERTING LOOP COLOSTOMY 7/25 Dr. Ninfa Linden - POD 3 - awaiting return in bowel function - WBC down to 18 from 21, afebrile  Renal insufficiency/Left hydronephrosis - per urology Anemia -  Hg 10.2-->9 today DVT - on eliquis at home  ID - rocephin 7/22>>7/24. Cefotetan perioperative, ua neg VTE - heparin gtt, hgb down 1 pt. Would follow and repeat on Sun. If hgb stable, can prob restart on Sunday FEN - IVF, clear liquids for now. Doesn't like shakes-too sweet; could try unjury chicken soup shake Protein calorie malnutrition - prealb 10.   Leighton Ruff. Redmond Pulling, MD, FACS General, Bariatric, & Minimally Invasive Surgery [facility], [place]   LOS: 5 days     Gayland Curry 05/01/2017

## 2017-05-01 NOTE — Progress Notes (Addendum)
Daily Progress Note   Patient Name: Charlene [name]       Date: 05/01/2017 DOB: [birth year]  Age: 83 y.o. MRN#: [id number] Attending Physician: Benito Mccreedy, MD Primary Care Physician: Shon Baton, MD Admit Date: 04/25/2017  Reason for Consultation/Follow-up: symptom management, following disease trajectory to help guide decision making  Subjective:  patient is awake alert Her supra pubic spasms are well controlled now She rested well overnight No nausea daughter at bedside. Will request incentive spirometer be given to patient.  No fevers no chills  Patient was not able to participate in PT yesterday, she simply was feeling too weak, I reassured her that it was ok, that she could try today.  Discussed with daughter at bedside  See below:   Length of Stay: 5  Current Medications: Scheduled Meds:  . feeding supplement  1 Container Oral BID BM  . hydrocortisone   Rectal QID  . multivitamin-lutein  1 capsule Oral Daily  . senna-docusate  2 tablet Oral Daily    Continuous Infusions: . heparin 600 Units/hr (05/01/17 0824)  . methocarbamol (ROBAXIN)  IV 500 mg (05/01/17 0845)    PRN Meds: acetaminophen **OR** acetaminophen, docusate sodium, famotidine, hydrALAZINE, hydroxypropyl methylcellulose / hypromellose, methocarbamol (ROBAXIN)  IV, morphine injection, ondansetron (ZOFRAN) IV, ondansetron, oxyCODONE, polyethylene glycol, simethicone, sodium phosphate  Physical Exam         NAD Thin elderly lady Clear S1 S2 Abdomen not distended +BS, passing gas Colostomy bag in LUQ, has small reddish fluid, also has foley with dark/reddish urine.  No edema Awake alert oriented  Vital Signs: BP (!) 168/62 (BP Location: Left Arm)   Pulse 64   Temp 98.1 F (36.7 C) (Oral)    Resp 18   Ht 5\' 4"  (1.626 m)   Wt 58.5 kg (129 lb)   SpO2 91%   BMI 22.14 kg/m  SpO2: SpO2: 91 % O2 Device: O2 Device: Not Delivered O2 Flow Rate: O2 Flow Rate (L/min): 2 L/min  Intake/output summary:   Intake/Output Summary (Last 24 hours) at 05/01/17 0935 Last data filed at 05/01/17 0524  Gross per 24 hour  Intake          1656.66 ml  Output             2500 ml  Net          -  843.34 ml   LBM: Last BM Date:  (has colostomy ) Baseline Weight: Weight: 57.1 kg (125 lb 14.1 oz) Most recent weight: Weight: 58.5 kg (129 lb)       Palliative Assessment/Data:    Flowsheet Rows     Most Recent Value  Intake Tab  Referral Department  -- [ER]  Unit at Time of Referral  ER  Palliative Care Primary Diagnosis  Cancer  Date Notified  04/25/17  Palliative Care Type  New Palliative care  Reason for referral  Pain, Clarify Goals of Care  Date of Admission  04/25/17  Date first seen by Palliative Care  04/25/17  # of days Palliative referral response time  0 Day(s)  # of days IP prior to Palliative referral  0  Clinical Assessment  Palliative Performance Scale Score  50%  Pain Max last 24 hours  9  Pain Min Last 24 hours  4  Psychosocial & Spiritual Assessment  Palliative Care Outcomes  Patient/Family meeting held?  Yes  Who was at the meeting?  Patient, son, daughter in law      Patient Active Problem List   Diagnosis Date Noted  . Gross hematuria 04/30/2017  . Pyuria   . Deep vein thrombosis (DVT) of lower extremity ([place])   . Malignant neoplasm of sigmoid colon ([place])   . Hydronephrosis   . Encounter for palliative care   . Goals of care, counseling/discussion   . Large bowel obstruction ([place]) 04/26/2017  . Abdominal pain 04/25/2017  . UTI (urinary tract infection) 04/25/2017  . Renal insufficiency 04/25/2017  . Anemia 04/25/2017  . Hypertension 04/25/2017  . Pelvic abscess in female 02/02/2017  . Colovesical fistula with cancer s/p colectomy/repair 01/14/2017  01/18/2017  . 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 Protein-calorie malnutrition, severe 02/01/2016  . Depression 01/31/2016  . GERD (gastroesophageal reflux disease) 01/31/2016  . Hemorrhoids 01/31/2016  . Acute diverticulitis 05/05/2015  . Abnormal LFTs 05/05/2015  . Dyslipidemia 03/13/2015  . Leukocytosis 03/13/2015  . Acute renal failure ([place]) 03/13/2015    Palliative Care Assessment & Plan   Patient Profile:    Assessment:  metastatic colon cancer Constipation Abdominal pain Large bowel obstruction  Recommendations/Plan:  s/p diverting colostomy soon  Med onc and surgery following Add incentive spirometry Agree with Robaxin IV Pain and non pain regimen reviewed, no additional recommendations at this time.  Remains on clear liquids for now Will continue to follow   Code Status:    Code Status Orders        Start     Ordered   04/25/17 2150  Do not attempt resuscitation (DNR)  Continuous    Question Answer Comment  In the event of cardiac or respiratory ARREST Do not call a "code blue"   In the event of cardiac or respiratory ARREST Do not perform Intubation, CPR, defibrillation or ACLS   In the event of cardiac or respiratory ARREST Use medication by any route, position, wound care, and other measures to relive pain and suffering. May use oxygen, suction and manual treatment of airway obstruction as needed for comfort.      04/25/17 2149    Code Status History    Date Active Date Inactive Code Status Order ID Comments User Context   02/03/2017  2:36 AM 02/07/2017  8:41 PM DNR [id number]  Nila Nephew, RN Inpatient   02/02/2017  7:22 PM 02/03/2017  2:36 AM Full Code [id number]  Armandina Gemma, MD  ED   01/11/2017  9:26 AM 01/21/2017  5:14 PM Full Code [id number]  Leighton Ruff, MD Inpatient   01/31/2016  2:09 AM 02/02/2016  3:30 PM Full Code [id number]  Vianne Bulls, MD Inpatient   05/05/2015  9:12 PM 05/08/2015  1:15 PM  Full Code [id number]  Reubin Milan, MD Inpatient   03/13/2015  5:20 PM 03/15/2015  5:34 PM Full Code [id number]  Robbie Lis, MD Inpatient       Prognosis:   Unable to determine  Discharge Planning:  To Be Determined  Care plan was discussed with  Patient, daughter   Thank you for allowing the Palliative Medicine Team to assist in the care of this patient.   Time In: 9 Time Out: 9.35 Total Time 35 Prolonged Time Billed  no       Greater than 50%  of this time was spent counseling and coordinating care related to the above assessment and plan.  Loistine Chance, MD [phone]  Please contact Palliative Medicine Team phone at [phone] for questions and concerns.

## 2017-05-01 NOTE — Progress Notes (Signed)
ANTICOAGULATION CONSULT NOTE  - Brief Note (aPTT follow-up)  Pharmacy Consult for heparin Indication: DVT - bridge while off apixaban for surgery  Refer to pharmacist's note this morning for full details:  7/27 16:00 aPTT = 28 sec(SUBtherapeutic on 600 units/hr),  -Ongoing hematuria noted, unchanged per RN.   Goal of Therapy:  Heparin level 0.3-0.7 aPTT 66-102 seconds Monitor platelets by anticoagulation protocol: Yes  Plan:  Increase heparin IV infusion to 650 units/hr (slight increase to target lower end of goal d/t hematuria)  Monitor for worsening bleeding  Recheck 8hr aPTT  Charlene [name], PharmD, BCPS Pager: [phone] 05/01/2017 4:45 PM

## 2017-05-01 NOTE — Progress Notes (Signed)
PROGRESS NOTE    Charlene [name]  OHY:[id number] DOB: [birth year] DOA: 04/25/2017 PCP: Shon Baton, MD    Brief Narrative:  Charlene [name] is a 83 yo female with PMHx of metastatic colon cancer to liver who presents with lower abdominal pain, lack of bowel movement in > 1 week, nausea. CT abdomen and pelvis was completed emergency department, which showed small liver metastases, persistent left hydronephrosis due to ureteral obstruction in the left pelvic retroperitoneum, large bowel obstruction with a large amount of fecal matter proximal to this region. Urology, general surgery, gastroenterology consulted. Patient underwent diverting loop colostomy on 04/28/2017.    Assessment & Plan:   Principal Problem:   Abdominal pain Active Problems:   UTI (urinary tract infection)   Renal insufficiency   Anemia   Hypertension   Large bowel obstruction (HCC)   Malignant neoplasm of sigmoid colon (HCC)   Hydronephrosis   Encounter for palliative care   Goals of care, counseling/discussion   Pyuria   Deep vein thrombosis (DVT) of lower extremity (HCC)   Gross hematuria   Atelectasis  Metastatic colon cancer to the liver /Large bowel obstruction  GI and general surgery has been consulting.  Per GI, colonic stent is not feasible due to tumor length and tortuosity of colon.  Status post open sigmoidectomy with repair of colovesicular fistula 01/14/2017 per Dr. Marcello Moores.  General surgery/GI recommended diverting colostomy which was done 04/28/2017.. S/p Enema and digital disimpaction with no significant results. Patient currently on clears per general surgery until bowels are moving. .  Acute kidney injury secondary to obstruction, with left hydronephrosis and complete left urethral obstruction Baseline Cr 0.63. Urology consulted, no emergent indication for intervention at this point.   Patient s/p cystoscopy with retrograde pyelogram/ per urology 04/28/2017 which noted a complete left  urethral obstruction and inability to cannulate left urethra and passed Y have beyond the UVJ. 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  Follow renal function with electrolytes and avoid nephrotoxins. Cr stable currently. Monitor I/O  Outpatient follow-up with urology.  Pyuria Urine culture with contamination. IV Rocephin discontinued.   Metastatic colon cancer to the liver s/p open sigmoidectomy with repair of colovesicular fistula  Patient follows with Dr. Benay Spice Palliative care also following   Anemia of chronic disease Hgb stable, monitor   DVT Held eliquis pending surgical intervention. Started heparin gtt, which was held 6 hours prior to surgery. Heparin drip has been presumed to have a patient with some blood in ostomy site, old blood noted in Foley catheter. Flush Foley and continue IV heparin with level at lower end of therapeutic goal until hematuria has resolved and they could transition back to home regimen of Levaquin.   Gross hematuria Patient noted to have gross hematuria late yesterday afternoon. Foley catheter was flushed and small clots noted. Foley catheter was subsequently removed overnight due to patient's complaints of significant lower abdominal pain and spasms. Larger Foley catheter to be placed today and flushed. Urology following.  Leukocytosis Patient with a worsening leukocytosis. Urinalysis pending. Check a chest x-ray. Check Blood cultures x 2.    DVT prophylaxis: SCD/heparin drip Code Status: DO NOT RESUSCITATE Family Communication: Updated patient and family at bedside. Disposition Plan: Pending consultative service and surgical intervention and per general surgery.   Consultants:   Palliative care Dr. Domingo Cocking 04/25/2017  Urology Dr. Alyson Ingles 04/25/2017  Gen. surgery Dr. Excell Seltzer 04/26/2017  Gastroenterology Dr.Danis III 04/26/2017  Wound care  Oncology Dr. Benay Spice 04/27/2017  Procedures:    CT abdomen and pelvis 04/25/2017  Laparoscopic-assisted diverting loop colostomy per Dr. Ninfa Linden general surgery 04/28/2017  Cystourethroscopy, left retrograde pyelogram, intraoperative fluoroscopy with interpretation per Dr. Alinda Money 04/28/2017  Antimicrobials:   IV Rocephin 04/25/2017>>>> 04/27/2017   Subjective: Patient states had a miserable night last night. Patient complaining of lower abdominal pain and cramping. Foley catheter was removed overnight as patient could not stand the pain. Prior to Foley catheter be removed patient was noted to have some gross hematuria. Passing flatus. No bowel movement.   Objective: Vitals:   04/30/17 0443 04/30/17 1443 04/30/17 2102 05/01/17 0519  BP: (!) 168/61 (!) 147/66 (!) 151/61 (!) 168/62  Pulse: 71 73 64   Resp: 18 19 20 18   Temp: 98.3 F (36.8 C) 98.2 F (36.8 C) 98.2 F (36.8 C) 98.1 F (36.7 C)  TempSrc: Oral Oral Oral Oral  SpO2: 92% 92% 91% 91%  Weight:      Height:        Intake/Output Summary (Last 24 hours) at 05/01/17 0943 Last data filed at 05/01/17 0524  Gross per 24 hour  Intake          1656.66 ml  Output             2500 ml  Net          -843.34 ml   Filed Weights   04/26/17 0550 04/27/17 0610 04/28/17 1321  Weight: 57.8 kg (127 lb 6.8 oz) 58.6 kg (129 lb 1.6 oz) 58.5 kg (129 lb)    Examination:  General exam: NAD, comfortable  Respiratory system: Clear to auscultation anterior lung fields. Respiratory effort normal. Cardiovascular system: S1 & S2 heard, RRR. No JVD, murmurs, rubs, gallops or clicks. No pedal edema. Gastrointestinal system: Abdomen is nondistended, soft and nontender. No organomegaly or masses felt. Normal bowel sounds heard. Ostomy intact with blood in ostomy pouch. Central nervous system: Alert and oriented. No focal neurological deficits. Extremities: Symmetric 5 x 5 power. Skin: No rashes, lesions or ulcers Psychiatry: Judgement and insight appear normal. Mood & affect appropriate.   Genitourinary: Foley catheter in place with dark urine/old blood    Data Reviewed: I have personally reviewed following labs and imaging studies  CBC:  Recent Labs Lab 04/26/17 0443 04/27/17 0421 04/29/17 0456 04/30/17 0435 05/01/17 0502  WBC 12.1* 10.0 11.8* 21.0* 18.5*  NEUTROABS  --  8.0* 11.1*  --   --   HGB 10.4* 9.6* 10.2* 10.2* 9.0*  HCT 31.9* 30.0* 31.8* 31.1* 26.7*  MCV 80.6 81.7 81.3 79.5 79.2  PLT 335 318 367 421* 466   Basic Metabolic Panel:  Recent Labs Lab 04/27/17 0421 04/28/17 0825 04/28/17 0833 04/29/17 0456 04/30/17 0435 05/01/17 0502  NA 135 134*  --  133* 133* 133*  K 4.1 4.0  --  4.7 3.6 3.4*  CL 104 105  --  106 107 103  CO2 20* 20*  --  16* 19* 22  GLUCOSE 51* 59*  --  114* 132* 114*  BUN 18 15  --  17 19 15   CREATININE 1.28* 1.16*  --  1.20* 1.07* 1.14*  CALCIUM 8.0* 8.1*  --  8.0* 7.8* 7.9*  MG  --   --  1.8 1.7  --   --    GFR: Estimated Creatinine Clearance: 30.6 mL/min (A) (by C-G formula based on SCr of 1.14 mg/dL (H)). Liver Function Tests:  Recent Labs Lab 04/25/17 1504 04/26/17 0443  AST  35 21  ALT 15 13*  ALKPHOS 57 49  BILITOT 1.2 0.7  PROT 7.5 6.3*  ALBUMIN 3.7 3.1*    Recent Labs Lab 04/25/17 1504  LIPASE 24   No results for input(s): AMMONIA in the last 168 hours. Coagulation Profile:  Recent Labs Lab 04/26/17 0443  INR 1.37   Cardiac Enzymes: No results for input(s): CKTOTAL, CKMB, CKMBINDEX, TROPONINI in the last 168 hours. BNP (last 3 results) No results for input(s): PROBNP in the last 8760 hours. HbA1C: No results for input(s): HGBA1C in the last 72 hours. CBG: No results for input(s): GLUCAP in the last 168 hours. Lipid Profile: No results for input(s): CHOL, HDL, LDLCALC, TRIG, CHOLHDL, LDLDIRECT in the last 72 hours. Thyroid Function Tests: No results for input(s): TSH, T4TOTAL, FREET4, T3FREE, THYROIDAB in the last 72 hours. Anemia Panel: No results for input(s): VITAMINB12, FOLATE,  FERRITIN, TIBC, IRON, RETICCTPCT in the last 72 hours. Sepsis Labs: No results for input(s): PROCALCITON, LATICACIDVEN in the last 168 hours.  Recent Results (from the past 240 hour(s))  Urine culture     Status: Abnormal   Collection Time: 04/25/17 10:46 PM  Result Value Ref Range Status   Specimen Description URINE, CLEAN CATCH  Final   Special Requests NONE  Final   Culture MULTIPLE SPECIES PRESENT, SUGGEST RECOLLECTION (A)  Final   Report Status 04/27/2017 FINAL  Final  Surgical pcr screen     Status: None   Collection Time: 04/27/17  9:18 PM  Result Value Ref Range Status   MRSA, PCR NEGATIVE NEGATIVE Final   Staphylococcus aureus NEGATIVE NEGATIVE Final    Comment:        The Xpert SA Assay (FDA approved for NASAL specimens in patients over 67 years of age), is one component of a comprehensive surveillance program.  Test performance has been validated by [facility] for patients greater than or equal to 60 year old. It is not intended to diagnose infection nor to guide or monitor treatment.          Radiology Studies: Dg Chest 2 View  Result Date: 04/30/2017 CLINICAL DATA:  Leukocytosis. EXAM: CHEST  2 VIEW COMPARISON:  CT the abdomen and pelvis 04/19/2017 FINDINGS: The heart is mildly enlarged. Atherosclerotic changes are present at the aortic arch. Left greater than right pleural effusions and basilar airspace disease is present. The upper lung fields are clear. The lungs are mildly hyperinflated. Mild pulmonary vascular congestion is present. The visualized soft tissues and bony thorax are unremarkable. IMPRESSION: 1. Left greater than right pleural effusion and airspace disease. While this may reflect atelectasis, infection is not excluded. 2. Cardiomegaly and mild pulmonary vascular congestion. Electronically Signed   By: San Morelle M.D.   On: 04/30/2017 08:41        Scheduled Meds: . feeding supplement  1 Container Oral BID BM  . hydrocortisone    Rectal QID  . multivitamin-lutein  1 capsule Oral Daily  . senna-docusate  2 tablet Oral Daily   Continuous Infusions: . heparin 600 Units/hr (05/01/17 0824)  . methocarbamol (ROBAXIN)  IV 500 mg (05/01/17 0845)     LOS: 5 days    Time spent: 25 mins    OSEI-BONSU,Yezenia Fredrick, MD [facility] Pager [phone]  If 7PM-7AM, please contact night-coverage [email or web address] Password [facility] 05/01/2017, 9:43 AM

## 2017-05-01 NOTE — Progress Notes (Signed)
Pt noted with foley catheter draining dark red urine. Heparin drip 700u/h. Updated MD. Pharmacist called, turned heparin down 600u/h. MD called back with orders to continue heparin drip. Pt with no other signs of bleeding noted. Denies pain discomfort. Changed heparin to 600u/h.

## 2017-05-01 NOTE — Progress Notes (Signed)
Notified Anderson about pt having 1110ml of blood emptied from colostomy. Will continue to monitor pt.

## 2017-05-01 NOTE — Progress Notes (Signed)
ANTICOAGULATION CONSULT NOTE    Pharmacy Consult for heparin Indication: DVT - bridge while off apixaban for surgery  No Known Allergies  Patient Measurements: Height: 5\' 4"  (162.6 cm) Weight: 129 lb (58.5 kg) IBW/kg (Calculated) : 54.7 Heparin Dosing Weight: 59kg  Vital Signs: Temp: 98.1 F (36.7 C) (07/28 0519) Temp Source: Oral (07/28 0519) BP: 168/62 (07/28 0519) Pulse Rate: 64 (07/27 2102)  Labs:  Recent Labs  04/29/17 0456  04/29/17 0753 04/30/17 0435 04/30/17 1315 05/01/17 0502  HGB 10.2*  --   --  10.2*  --  9.0*  HCT 31.8*  --   --  31.1*  --  26.7*  PLT 367  --   --  421*  --  356  APTT  --   < > 132* 76* 80* 103*  HEPARINUNFRC  --   --  0.94* 0.75*  --  0.91*  CREATININE 1.20*  --   --  1.07*  --  1.14*  < > = values in this interval not displayed.  Estimated Creatinine Clearance: 30.6 mL/min (A) (by C-G formula based on SCr of 1.14 mg/dL (H)).   Medical History: Past Medical History:  Diagnosis Date  . Acute renal failure ([place])   . Allergy    seasonal  . BPPV (benign paroxysmal positional vertigo) 2012  . Colon cancer metastasized to liver ([place])   . Depression   . Diverticulitis   . Dyslipidemia   . GERD (gastroesophageal reflux disease)   . Hemorrhoids   . Hypertension 04/25/2017  . Macular degeneration   . Renal insufficiency 04/25/2017  . Urine frequency   . Uterine carcinoma (HCC)     Assessment: 18 YOF with a large bowel obstruction with a history of metastatic colon cancer.  She has a history of DVT on apixaban 2.5mg  BID (dose reduced due to bleeding, advanced age & decreased renal function).  Apixaban on hold since patient going to OR 7/25 and bridging with IV heparin.   Last dose of apixaban was 7/22 at 0930  Baseline aPTT = 41 sec,  HL = 1.46 (falsely elevated due to effects of apixaban),  INR = 1.37  05/01/2017:   Heparin infusion at 700 units/hr   APTT = 103 sec (supratherapeutic) and Heparin level = 0.91  (supratherapeutic)  Hg low and dropped some, Pltc WNL  Blood in urine noted to be darker red again this AM - RN has contacted Md to see if need to hold heparin again - will follow up  No infusion related issues reported  Goal of Therapy:  Heparin level 0.3-0.7 aPTT 66-102 seconds Monitor platelets by anticoagulation protocol: Yes   Plan:   Reduce IV heparin infusion from 700 units/hr to 600 units/hr  Follow up if/when IV heparin needs to be held and/or restarted  Recheck aPTT in 8 hr after rate decrease  Daily CBC, aPTT and heparin level - once levels correlate, can monitor heparin levels only  Follow up plans for transition back to apixaban once invasive procedures completed- IR consult pending  Adrian Saran, PharmD, BCPS Pager [phone] 05/01/2017 7:26 AM

## 2017-05-01 NOTE — Progress Notes (Signed)
Pt feels pressure to rectum. Fecal impaction noted. Updated MD. MD with order to digitally stimulate and disimpact. Pt tolerated with max pain level 6/10. Stopped digital removal. Transferred pt to bedside commode. Pain subsided to 4/10 and tolerable.

## 2017-05-02 LAB — BASIC METABOLIC PANEL
Anion gap: 9 (ref 5–15)
BUN: 14 mg/dL (ref 6–20)
CHLORIDE: 103 mmol/L (ref 101–111)
CO2: 20 mmol/L — AB (ref 22–32)
CREATININE: 0.99 mg/dL (ref 0.44–1.00)
Calcium: 7.9 mg/dL — ABNORMAL LOW (ref 8.9–10.3)
GFR calc Af Amer: 58 mL/min — ABNORMAL LOW (ref >60)
GFR calc non Af Amer: 50 mL/min — ABNORMAL LOW (ref >60)
Glucose, Bld: 86 mg/dL (ref 65–99)
Potassium: 3.5 mmol/L (ref 3.5–5.1)
Sodium: 132 mmol/L — ABNORMAL LOW (ref 135–145)

## 2017-05-02 LAB — CBC
HEMATOCRIT: 28 % — AB (ref 36.0–46.0)
HEMOGLOBIN: 9.5 g/dL — AB (ref 12.0–15.0)
MCH: 26.9 pg (ref 26.0–34.0)
MCHC: 33.9 g/dL (ref 30.0–36.0)
MCV: 79.3 fL (ref 78.0–100.0)
Platelets: 325 10*3/uL (ref 150–400)
RBC: 3.53 MIL/uL — ABNORMAL LOW (ref 3.87–5.11)
RDW: 17.6 % — AB (ref 11.5–15.5)
WBC: 18.8 10*3/uL — ABNORMAL HIGH (ref 4.0–10.5)

## 2017-05-02 LAB — HEPARIN LEVEL (UNFRACTIONATED)
Heparin Unfractionated: 0.73 IU/mL — ABNORMAL HIGH (ref 0.30–0.70)
Heparin Unfractionated: 0.76 IU/mL — ABNORMAL HIGH (ref 0.30–0.70)
Heparin Unfractionated: 0.64 IU/mL (ref 0.30–0.70)

## 2017-05-02 LAB — APTT: aPTT: 41 seconds — ABNORMAL HIGH (ref 24–36)

## 2017-05-02 MED ORDER — FLEET ENEMA 7-19 GM/118ML RE ENEM
1.0000 | ENEMA | Freq: Once | RECTAL | Status: DC
  Filled 2017-05-02: qty 1

## 2017-05-02 NOTE — Progress Notes (Signed)
Pt with increased manual BP. Denies pain or discomfort. No SOB. Administered PRN hydralazine.

## 2017-05-02 NOTE — Progress Notes (Addendum)
[place] for heparin Indication: DVT - bridge while off apixaban for surgery  No Known Allergies  Patient Measurements: Height: 5\' 4"  (162.6 cm) Weight: 129 lb (58.5 kg) IBW/kg (Calculated) : 54.7 Heparin Dosing Weight: 59kg  Vital Signs: Temp: 97.8 F (36.6 C) (07/28 2028) Temp Source: Oral (07/28 2028) BP: 162/58 (07/28 2028) Pulse Rate: 66 (07/28 2028)  Labs:  Recent Labs  04/30/17 0435  05/01/17 0502 05/01/17 1545 05/02/17 0121  HGB 10.2*  --  9.0*  --  9.5*  HCT 31.1*  --  26.7*  --  28.0*  PLT 421*  --  356  --  325  APTT 76*  < > 103* 28 41*  HEPARINUNFRC 0.75*  --  0.91* 0.61 0.73*  CREATININE 1.07*  --  1.14*  --  0.99  < > = values in this interval not displayed.  Estimated Creatinine Clearance: 35.2 mL/min (by C-G formula based on SCr of 0.99 mg/dL).   Assessment: 25 YOF with a large bowel obstruction with a history of metastatic colon cancer.  She has a history of DVT on apixaban 2.5mg  BID (dose reduced due to bleeding, advanced age & decreased renal function).  Apixaban on hold since patient going to OR 7/25 and bridging with IV heparin.   Last dose of apixaban was 7/22 at 0930  Baseline aPTT = 41 sec,  HL = 1.46 (falsely elevated due to effects of apixaban),  INR = 1.37  05/02/2017: aPTT is low at 41 seconds but heparin level is therapeutic at 0.72.  Heparin level and aPTT not correlating.  Apixaban should be out of system since last dose 7/22 and should be able to use heparin levels.  At 2330: 135ml of blood emptied from colostomy- RN called K Schorr>> continue heparin.   Pt had continued scant blood from colostomy all night but not as much as 100 mls, still w/ dark blood in foley catheter. CBC remains stable.    Goal of Therapy:  Heparin level 0.3-0.7 aPTT 66-102 seconds Monitor platelets by anticoagulation protocol: Yes   Pan:  Continue heparin at 650 units/hr and recheck aPTT and HL in 8 hrs  Daily  CBC, aPTT and heparin level - once levels correlate, can monitor heparin levels only  Follow up plans for transition back to apixaban once invasive procedures completed- IR consult pending  Closely f/u blood in colostomy and foley  Eudelia Bunch, Pharm.D. [phone] 05/02/2017 3:37 AM

## 2017-05-02 NOTE — Progress Notes (Addendum)
ANTICOAGULATION CONSULT NOTE    Pharmacy Consult for heparin Indication: DVT - bridge while off apixaban for surgery  No Known Allergies  Patient Measurements: Height: 5\' 4"  (162.6 cm) Weight: 129 lb (58.5 kg) IBW/kg (Calculated) : 54.7 Heparin Dosing Weight: 59kg  Vital Signs: Temp: 97.8 F (36.6 C) (07/29 0452) Temp Source: Oral (07/29 0452) BP: 155/56 (07/29 0452) Pulse Rate: 61 (07/29 0452)  Labs:  Recent Labs  04/30/17 0435  05/01/17 0502 05/01/17 1545 05/02/17 0121 05/02/17 0939  HGB 10.2*  --  9.0*  --  9.5*  --   HCT 31.1*  --  26.7*  --  28.0*  --   PLT 421*  --  356  --  325  --   APTT 76*  < > 103* 28 41*  --   HEPARINUNFRC 0.75*  --  0.91* 0.61 0.73* 0.76*  CREATININE 1.07*  --  1.14*  --  0.99  --   < > = values in this interval not displayed.  Estimated Creatinine Clearance: 35.2 mL/min (by C-G formula based on SCr of 0.99 mg/dL).   Assessment: 89 YOF with a large bowel obstruction with a history of metastatic colon cancer.  She has a history of DVT on apixaban 2.5mg  BID (dose reduced due to bleeding, advanced age & decreased renal function).  Apixaban on hold since patient going to OR 7/25 and bridging with IV heparin.   Last dose of apixaban was 7/22 at 0930  Baseline aPTT = 41 sec,  HL = 1.46 (falsely elevated due to effects of apixaban),  INR = 1.37  05/02/2017:  Heparin level 0.76 (SUPRAtherapeutic)  Hg low, but stable.  Pltc WNL  Hematuria with some improvement per RN- foley catheter contents a lighter red, more transparent  Goal of Therapy:  Heparin level 0.3-0.7 (target low end of goal range per MD due to hematuria) Monitor platelets by anticoagulation protocol: Yes   Pan:  Decrease heparin infusion to 500 units/hr   Recheck HL in 8 hrs  Since aPTT and heparin level are not correlating, last dose of apixaban now 1 week ago (should be cleared), and aPTT levels less reliable/ currently levels not consistent with baseline level will  ONLY USE HEPARIN LEVEL to dose heparin going forward.    Daily CBC and heparin level  Follow up plans for transition back to apixaban- continue to hold per surgery since no reversal agent.   Closely f/u blood in colostomy and foley  Charlene [name], PharmD, BCPS Pager: [phone] 05/02/2017 10:25 AM  Addendum:   Repeat 8hr heparin level = 0.64 on 500 units/hr. Therapeutic at upper end of goal range. Spoke with RN- hematuria unchanged from this morning.  Decrease heparin 450 units/hr.  F/U AM labs (~8hr level)  Charlene [name], PharmD, BCPS Pager: [phone] 05/02/2017@7 :15 PM

## 2017-05-02 NOTE — Progress Notes (Signed)
Patient ID: Charlene [name], female   DOB: May 28, 1928, 83 y.o.   MRN: [id number]   Acute Care Surgery Service Progress Note:    Chief Complaint/Subjective: Charlene [name] was 'bad'. Felt rectal pressure and was impacted and had to be disimpacted which was painful. Some reports of blood per ostomy. Nurse had some difficulty in changing stoma appliance due to bridge. Did not get out of bed yesterday  Objective: Vital signs in last 24 hours: Temp:  [97.8 F (36.6 C)-98 F (36.7 C)] 97.8 F (36.6 C) (07/29 0452) Pulse Rate:  [61-68] 61 (07/29 0452) Resp:  [18] 18 (07/29 0452) BP: (155-192)/(56-90) 155/56 (07/29 0452) SpO2:  [96 %-98 %] 97 % (07/29 0452) Last BM Date: 05/01/17  Intake/Output from previous day: 07/28 0701 - 07/29 0700 In: 88.6 [I.V.:33.6; IV Piggyback:55] Out: 1095 [Urine:650; Stool:445] Intake/Output this shift: No intake/output data recorded.  Lungs: cta, nonlabored  Cardiovascular: reg  Abd: soft, some mild distension, ostomy - no air in bag, edematous, hyperemic but still appears viable  Extremities: no edema, +SCDs  Neuro: alert, nonfocal  Lab Results: CBC   Recent Labs  05/01/17 0502 05/02/17 0121  WBC 18.5* 18.8*  HGB 9.0* 9.5*  HCT 26.7* 28.0*  PLT 356 325   BMET  Recent Labs  05/01/17 0502 05/02/17 0121  NA 133* 132*  K 3.4* 3.5  CL 103 103  CO2 22 20*  GLUCOSE 114* 86  BUN 15 14  CREATININE 1.14* 0.99  CALCIUM 7.9* 7.9*   LFT Hepatic Function Latest Ref Rng & Units 04/26/2017 04/25/2017 02/02/2017  Total Protein 6.5 - 8.1 g/dL 6.3(L) 7.5 7.7  Albumin 3.5 - 5.0 g/dL 3.1(L) 3.7 2.8(L)  AST 15 - 41 U/L 21 35 62(H)  ALT 14 - 54 U/L 13(L) 15 46  Alk Phosphatase 38 - 126 U/L 49 57 150(H)  Total Bilirubin 0.3 - 1.2 mg/dL 0.7 1.2 1.3(H)  Bilirubin, Direct 0.0 - 0.3 mg/dL - - -   PT/INR No results for input(s): LABPROT, INR in the last 72 hours. ABG No results for input(s): PHART, HCO3 in the last 72 hours.  Invalid input(s): PCO2,  PO2  Studies/Results:  Anti-infectives: Anti-infectives    Start     Dose/Rate Route Frequency Ordered Stop   04/28/17 1334  cefoTEtan in Dextrose 5% (CEFOTAN) 2-2.08 GM-% IVPB    Comments:  Charlene [name], Charlene [name]   : cabinet override      04/28/17 1334 04/28/17 1405   04/28/17 1215  cefoTEtan in Dextrose 5% (CEFOTAN) IVPB 2 g     2 g Intravenous On call to O.R. 04/27/17 1255 04/28/17 1420   04/28/17 0600  cefoTEtan (CEFOTAN) 2 g in dextrose 5 % 50 mL IVPB  Status:  Discontinued     2 g 100 mL/hr over 30 Minutes Intravenous On call to O.R. 04/27/17 1233 04/27/17 1255   04/25/17 2100  cefTRIAXone (ROCEPHIN) 1 g in dextrose 5 % 50 mL IVPB  Status:  Discontinued     1 g 100 mL/hr over 30 Minutes Intravenous Every 24 hours 04/25/17 2052 04/27/17 1220      Medications: Scheduled Meds: . feeding supplement  1 Container Oral BID BM  . hydrocortisone   Rectal QID  . multivitamin-lutein  1 capsule Oral Daily  . potassium chloride  20 mEq Oral BID  . senna-docusate  2 tablet Oral Daily   Continuous Infusions: . heparin 650 Units/hr (05/02/17 0532)  . methocarbamol (ROBAXIN)  IV Stopped (05/01/17 1049)   PRN Meds:.acetaminophen **OR**  acetaminophen, docusate sodium, famotidine, hydrALAZINE, hydroxypropyl methylcellulose / hypromellose, methocarbamol (ROBAXIN)  IV, morphine injection, ondansetron (ZOFRAN) IV, ondansetron, opium-belladonna, oxyCODONE, polyethylene glycol, simethicone, sodium phosphate  Assessment/Plan: Patient Active Problem List   Diagnosis Date Noted  . Atelectasis   . Gross hematuria 04/30/2017  . Pyuria   . Deep vein thrombosis (DVT) of lower extremity ([place])   . Malignant neoplasm of sigmoid colon ([place])   . Hydronephrosis   . Encounter for palliative care   . Goals of care, counseling/discussion   . Large bowel obstruction ([place]) 04/26/2017  . Abdominal pain 04/25/2017  . UTI (urinary tract infection) 04/25/2017  . Renal insufficiency 04/25/2017  . Anemia  04/25/2017  . Hypertension 04/25/2017  . Pelvic abscess in female 02/02/2017  . Colovesical fistula with cancer s/p colectomy/repair 01/14/2017 01/18/2017  . 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 Protein-calorie malnutrition, severe 02/01/2016  . Depression 01/31/2016  . GERD (gastroesophageal reflux disease) 01/31/2016  . Hemorrhoids 01/31/2016  . Acute diverticulitis 05/05/2015  . Abnormal LFTs 05/05/2015  . Dyslipidemia 03/13/2015  . Leukocytosis 03/13/2015  . Acute renal failure ([place]) 03/13/2015   s/p Procedure(s): LAPAROSCOPIC ASSISTED DIVERTING  LOOP COLOSTOMY CYSTOSCOPY WITH LEFT RETROGRADE PYELOGRAM/ LEFTURETERAL STENT PLACEMENT 04/28/2017  Metastatic colon cancer to liver Large bowel obstruction S/p LAPAROSCOPIC ASSISTED DIVERTING LOOP COLOSTOMY 7/25 Dr. Ninfa [name] - POD 4 - awaiting return in bowel function - WBC down to 18 from 21, afebrile  Renal insufficiency/Left hydronephrosis - per urology Anemia - Hg 10.2-->9 today DVT - on eliquis at home  ID - rocephin 7/22>>7/24. Cefotetan perioperative, ua neg VTE - heparin gtt, hgb stable from sat to sun. Daily cbc. Given reports of bloody drainage from ostomy (which is not unusual), I would wait another day or so before restarting oral anticoagulant since no reversal agent.  FEN - IVF, will adv to fulls but pt reports no appetite Protein calorie malnutrition - prealb 10.  Encouraged pt to spend time in chair today but she declined Daughter at [facility]  LOS: 6 days    Charlene [name]. Redmond Pulling, MD, FACS General, Bariatric, & Minimally Invasive Surgery [phone] [facility]

## 2017-05-02 NOTE — Consult Note (Signed)
[place] Nurse ostomy follow up Stoma type/location: LUQ loop colostomy with plastic stomal bridge.  Pouch leaking at intervals since Saturday; Bedside RNs have changed pouching system several times to keep patient's skin free from effluent and to avoid contact dermatitis/peristomal moisture associated skin damage (pMASD). Stomal assessment/size: 2 and 1/4 inches slightly oblong.  Patient is uncomfortable with cleansing of stool from around bridge and stoma.  PRN IV pain medication administered by bedside RN during pouch change.. Peristomal assessment: intact with erythema in the immediate peristomal area secondary to leakage around peristomal bridge.  Treatment options for stomal/peristomal skin: Skin barrier ring placed around stoma; one-half of skin barrier ring placed over plastic stoma bridge "arms" on each side prior to pouching Output: liquid brown effluent in a small amount Ostomy pouching: 2pc. 2 and 3/4 inch pouching system with skin barrier ring around stoma and another cut in half and placed over rod "arms" on each side of ostomy. Education provided: None today. Enrolled patient in [place] Start Discharge program: Yes, on Friday 7/26. Physician communication to request that stoma bridge be removed with next pouch change, ideally on Monday, 7/30. (POD #5).  If you agree, please indicate in note or orders. [place] nursing team will follow, and will remain available to this patient, the nursing, surgical and medical teams.   Thanks, Maudie Flakes, MSN, RN, [place], Arther Abbott  Pager# [phone]

## 2017-05-02 NOTE — Progress Notes (Signed)
PROGRESS NOTE    Charlene [name]  VOH:[id number] DOB: September 17, 1928 DOA: 04/25/2017 PCP: Shon Baton, MD    Brief Narrative:  Charlene [name] is a 83 yo female with PMHx of metastatic colon cancer to liver who presents with lower abdominal pain, lack of bowel movement in > 1 week, nausea. CT abdomen and pelvis was completed emergency department, which showed small liver metastases, persistent left hydronephrosis due to ureteral obstruction in the left pelvic retroperitoneum, large bowel obstruction with a large amount of fecal matter proximal to this region. Urology, general surgery, gastroenterology consulted. Patient underwent diverting loop colostomy on 04/28/2017.    Assessment & Plan:   Principal Problem:   Abdominal pain Active Problems:   UTI (urinary tract infection)   Renal insufficiency   Anemia   Hypertension   Large bowel obstruction ([facility])   Malignant neoplasm of sigmoid colon ([facility])   Hydronephrosis   Encounter for palliative care   Goals of care, counseling/discussion   Pyuria   Deep vein thrombosis (DVT) of lower extremity ([facility])   Gross hematuria   Atelectasis  Metastatic colon cancer to the liver /Large bowel obstruction  GI and general surgery has been consulting.  Per GI, colonic stent is not feasible due to tumor length and tortuosity of colon.  Status post open sigmoidectomy with repair of colovesicular fistula 01/14/2017 per Dr. Marcello Moores.  General surgery/GI recommended diverting colostomy which was done 04/28/2017.. S/p Enema and digital disimpaction with no significant results. Patient currently on clears per general surgery until bowels are moving. .  Acute kidney injury secondary to obstruction, with left hydronephrosis and complete left urethral obstruction Baseline Cr 0.63. Urology consulted, no emergent indication for intervention at this point.   Patient s/p cystoscopy with retrograde pyelogram/ per urology 04/28/2017 which noted a complete left  urethral obstruction and inability to cannulate left urethra and passed Y have beyond the UVJ. 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  Follow renal function with electrolytes and avoid nephrotoxins. Cr stable currently. Monitor I/O  Outpatient follow-up with urology.  Pyuria Urine culture with contamination. IV Rocephin discontinued.   Metastatic colon cancer to the liver s/p open sigmoidectomy with repair of colovesicular fistula  Patient follows with Dr. Benay Spice Palliative care also following   Anemia of chronic disease Hgb stable, monitor   DVT Held eliquis pending surgical intervention. Started heparin gtt, which was held 6 hours prior to surgery. Heparin drip has been presumed to have a patient with some blood in ostomy site, old blood noted in Foley catheter. Flush Foley and continue IV heparin with level at lower end of therapeutic goal until hematuria has resolved and they could transition back to home regimen of Levaquin.   Gross hematuria Patient noted to have gross hematuria late yesterday afternoon. Foley catheter was flushed and small clots noted. Foley catheter was subsequently removed overnight due to patient's complaints of significant lower abdominal pain and spasms. Larger Foley catheter to be placed today and flushed. Urology following.  Leukocytosis Patient with a worsening leukocytosis. Urinalysis pending. Check a chest x-ray. Check Blood cultures x 2.    DVT prophylaxis: SCD/heparin drip Code Status: DO NOT RESUSCITATE Family Communication: Updated patient and family at bedside. Disposition Plan: Pending consultative service and surgical intervention and per general surgery.   Consultants:   Palliative care Dr. Domingo Cocking 04/25/2017  Urology Dr. Alyson Ingles 04/25/2017  Gen. surgery Dr. Excell Seltzer 04/26/2017  Gastroenterology Dr.Danis III 04/26/2017  Wound care  Oncology Dr. Benay Spice 04/27/2017  Procedures:    CT abdomen and pelvis 04/25/2017  Laparoscopic-assisted diverting loop colostomy per Dr. Ninfa Linden general surgery 04/28/2017  Cystourethroscopy, left retrograde pyelogram, intraoperative fluoroscopy with interpretation per Dr. Alinda Money 04/28/2017  Antimicrobials:   IV Rocephin 04/25/2017>>>> 04/27/2017   Subjective: Feels impacted- manual disimpaction yesterday was very helpful, but was incomplete - given dose of fleet enema. Abd xray notes some gas distension but her bowel sounds are normoative this morning. Passed flatus yesterday Objective: Vitals:   05/01/17 1339 05/01/17 1352 05/01/17 2028 05/02/17 0452  BP: (!) 192/64 (!) 190/90 (!) 162/58 (!) 155/56  Pulse: 61 68 66 61  Resp: 18  18 18   Temp: 98 F (36.7 C)  97.8 F (36.6 C) 97.8 F (36.6 C)  TempSrc: Oral  Oral Oral  SpO2: 96%  98% 97%  Weight:      Height:        Intake/Output Summary (Last 24 hours) at 05/02/17 1104 Last data filed at 05/02/17 0458  Gross per 24 hour  Intake               24 ml  Output             1095 ml  Net            -1071 ml   Filed Weights   04/26/17 0550 04/27/17 0610 04/28/17 1321  Weight: 57.8 kg (127 lb 6.8 oz) 58.6 kg (129 lb 1.6 oz) 58.5 kg (129 lb)    Examination:  General exam: NAD, comfortable  Respiratory system: Clear to auscultation anterior lung fields. Respiratory effort normal. Cardiovascular system: S1 & S2 heard, RRR. No JVD, murmurs, rubs, gallops or clicks. No pedal edema. Gastrointestinal system: Abdomen is mildly distended, soft and nontender. No organomegaly or masses felt. Normal bowel sounds heard. Ostomy intact with blood in ostomy pouch. Central nervous system: Alert and oriented. No focal neurological deficits. Extremities: Symmetric 5 x 5 power. Skin: No rashes, lesions or ulcers Psychiatry: Judgement and insight appear normal. Mood & affect appropriate.  Genitourinary: Foley catheter in place with dark urine/old blood    Data Reviewed: I have  personally reviewed following labs and imaging studies  CBC:  Recent Labs Lab 04/27/17 0421 04/29/17 0456 04/30/17 0435 05/01/17 0502 05/02/17 0121  WBC 10.0 11.8* 21.0* 18.5* 18.8*  NEUTROABS 8.0* 11.1*  --   --   --   HGB 9.6* 10.2* 10.2* 9.0* 9.5*  HCT 30.0* 31.8* 31.1* 26.7* 28.0*  MCV 81.7 81.3 79.5 79.2 79.3  PLT 318 367 421* 356 601   Basic Metabolic Panel:  Recent Labs Lab 04/28/17 0825 04/28/17 0833 04/29/17 0456 04/30/17 0435 05/01/17 0502 05/02/17 0121  NA 134*  --  133* 133* 133* 132*  K 4.0  --  4.7 3.6 3.4* 3.5  CL 105  --  106 107 103 103  CO2 20*  --  16* 19* 22 20*  GLUCOSE 59*  --  114* 132* 114* 86  BUN 15  --  17 19 15 14   CREATININE 1.16*  --  1.20* 1.07* 1.14* 0.99  CALCIUM 8.1*  --  8.0* 7.8* 7.9* 7.9*  MG  --  1.8 1.7  --   --   --    GFR: Estimated Creatinine Clearance: 35.2 mL/min (by C-G formula based on SCr of 0.99 mg/dL). Liver Function Tests:  Recent Labs Lab 04/25/17 1504 04/26/17 0443  AST 35 21  ALT 15 13*  ALKPHOS 57 49  BILITOT  1.2 0.7  PROT 7.5 6.3*  ALBUMIN 3.7 3.1*    Recent Labs Lab 04/25/17 1504  LIPASE 24   No results for input(s): AMMONIA in the last 168 hours. Coagulation Profile:  Recent Labs Lab 04/26/17 0443  INR 1.37   Cardiac Enzymes: No results for input(s): CKTOTAL, CKMB, CKMBINDEX, TROPONINI in the last 168 hours. BNP (last 3 results) No results for input(s): PROBNP in the last 8760 hours. HbA1C: No results for input(s): HGBA1C in the last 72 hours. CBG: No results for input(s): GLUCAP in the last 168 hours. Lipid Profile: No results for input(s): CHOL, HDL, LDLCALC, TRIG, CHOLHDL, LDLDIRECT in the last 72 hours. Thyroid Function Tests: No results for input(s): TSH, T4TOTAL, FREET4, T3FREE, THYROIDAB in the last 72 hours. Anemia Panel: No results for input(s): VITAMINB12, FOLATE, FERRITIN, TIBC, IRON, RETICCTPCT in the last 72 hours. Sepsis Labs: No results for input(s): PROCALCITON,  LATICACIDVEN in the last 168 hours.  Recent Results (from the past 240 hour(s))  Urine culture     Status: Abnormal   Collection Time: 04/25/17 10:46 PM  Result Value Ref Range Status   Specimen Description URINE, CLEAN CATCH  Final   Special Requests NONE  Final   Culture MULTIPLE SPECIES PRESENT, SUGGEST RECOLLECTION (A)  Final   Report Status 04/27/2017 FINAL  Final  Surgical pcr screen     Status: None   Collection Time: 04/27/17  9:18 PM  Result Value Ref Range Status   MRSA, PCR NEGATIVE NEGATIVE Final   Staphylococcus aureus NEGATIVE NEGATIVE Final    Comment:        The Xpert SA Assay (FDA approved for NASAL specimens in patients over 30 years of age), is one component of a comprehensive surveillance program.  Test performance has been validated by [facility] for patients greater than or equal to 69 year old. It is not intended to diagnose infection nor to guide or monitor treatment.   Culture, blood (Routine X 2) w Reflex to ID Panel     Status: None (Preliminary result)   Collection Time: 04/30/17 10:02 AM  Result Value Ref Range Status   Specimen Description BLOOD LEFT HAND  Final   Special Requests AEROBIC BOTTLE ONLY Blood Culture adequate volume  Final   Culture   Final    NO GROWTH < 24 HOURS Performed at [facility], [place] [address], [place], [place] [address]    Report Status PENDING  Incomplete  Culture, blood (Routine X 2) w Reflex to ID Panel     Status: None (Preliminary result)   Collection Time: 04/30/17 10:02 AM  Result Value Ref Range Status   Specimen Description BLOOD LEFT HAND  Final   Special Requests AEROBIC BOTTLE ONLY Blood Culture adequate volume  Final   Culture   Final    NO GROWTH < 24 HOURS Performed at [facility], [place] [address], [place], [place] [address]    Report Status PENDING  Incomplete  Culture, Urine     Status: Abnormal   Collection Time: 04/30/17 12:14 PM  Result Value Ref Range Status   Specimen  Description URINE, CATHETERIZED  Final   Special Requests NONE  Final   Culture (A)  Final    <10,000 COLONIES/mL INSIGNIFICANT GROWTH Performed at [place] [address] [place], [place] [address]    Report Status 05/01/2017 FINAL  Final         Radiology Studies: Dg Abd 1 View  Result  Date: 05/01/2017 CLINICAL DATA:  Patient with lower abdominal pain. Prior colonoscopy. Prior ileal loop diversion. EXAM: ABDOMEN - 1 VIEW COMPARISON:  CT abdomen pelvis 04/25/2017 FINDINGS: Prominent gas cysts distended loop of bowel within the right lower quadrant, potentially represents gaseous distended colon. Oral contrast material within the bowel. Prominent loops of small bowel within the central abdomen. Supine evaluation limited for the detection of free intraperitoneal air. Pelvic surgical clips. Lumbar spine degenerative changes. IMPRESSION: Gaseous distended loop of bowel within the right lower quadrant favored to represent cecum. Mildly gaseous distended loops of small bowel. Overall findings may represent ileus. Electronically Signed   By: Lovey Newcomer M.D.   On: 05/01/2017 15:54        Scheduled Meds: . feeding supplement  1 Container Oral BID BM  . hydrocortisone   Rectal QID  . multivitamin-lutein  1 capsule Oral Daily  . potassium chloride  20 mEq Oral BID  . senna-docusate  2 tablet Oral Daily  . sodium phosphate  1 enema Rectal Once   Continuous Infusions: . heparin 650 Units/hr (05/02/17 0532)  . methocarbamol (ROBAXIN)  IV Stopped (05/01/17 1049)     LOS: 6 days    Time spent: 21 mins    OSEI-BONSU,Axtyn Woehler, MD [facility] Pager [phone]  If 7PM-7AM, please contact night-coverage [email or web address] Password [email or web address] 05/02/2017, 11:04 AM

## 2017-05-02 NOTE — Evaluation (Signed)
Physical Therapy Evaluation Patient Details Name: GUSTAVO MEDITZ MRN: [id number] DOB: Sep 14, 1928 Today's Date: 05/02/2017   History of Present Illness  Patient is a 83 y.o. female with metastatic colon cancer with extensive pelvic disease causing left ureteral obstruction, DVT on eliquis,. Patient presented with fatigue, severe constipation and abdominal pain .S/P LAPAROSCOPIC ASSISTED DIVERTING  LOOP COLOSTOMY on 7/25.  Clinical Impression  The patient is very frail. She did  Consent to getting up and reports that she walked another day. She prefers to return home but is home alone. Pt admitted with above diagnosis. Pt currently with functional limitations due to the deficits listed below (see PT Problem List).  Pt will benefit from skilled PT to increase their independence and safety with mobility to allow discharge to the venue listed below.        Follow Up Recommendations SNF;Supervision/Assistance - 24 hour (patient wants to go home and hire some help)    Equipment Recommendations  None recommended by PT    Recommendations for Other Services       Precautions / Restrictions Precautions Precautions: Fall Precaution Comments: ostomy leaks      Mobility  Bed Mobility Overal bed mobility: Needs Assistance Bed Mobility: Supine to Sit     Supine to sit: Min assist     General bed mobility comments: HOB raised, assist with trunk   Transfers Overall transfer level: Needs assistance   Transfers: Sit to/from Stand;Stand Pivot Transfers Sit to Stand: Mod assist Stand pivot transfers: Mod assist       General transfer comment: steady assist to rise, shuffle steps to recliner. ostomy leaking  Ambulation/Gait                Stairs            Wheelchair Mobility    Modified Rankin (Stroke Patients Only)       Balance                                             Pertinent Vitals/Pain Pain Assessment: Faces Faces Pain Scale: Hurts  even more Pain Location: abdomen Pain Descriptors / Indicators: Discomfort;Grimacing;Guarding Pain Intervention(s): Limited activity within patient's tolerance;Monitored during session    Home Living Family/patient expects to be discharged to:: Private residence Living Arrangements: Alone   Type of Home: House Home Access: Stairs to enter Entrance Stairs-Rails: None Technical brewer of Steps: 1 Home Layout: One level Home Equipment: None Additional Comments: patient is home a;lone    Prior Function                 Hand Dominance        Extremity/Trunk Assessment   Upper Extremity Assessment Upper Extremity Assessment: Generalized weakness    Lower Extremity Assessment Lower Extremity Assessment: Generalized weakness    Cervical / Trunk Assessment Cervical / Trunk Assessment: Normal  Communication   Communication: No difficulties  Cognition Arousal/Alertness: Awake/alert Behavior During Therapy: WFL for tasks assessed/performed Overall Cognitive Status: Within Functional Limits for tasks assessed                                        General Comments      Exercises     Assessment/Plan    PT Assessment Patient needs continued PT services  PT Problem List Decreased strength;Decreased activity tolerance;Decreased mobility;Decreased knowledge of precautions;Decreased safety awareness;Decreased knowledge of use of DME       PT Treatment Interventions DME instruction;Gait training;Functional mobility training;Therapeutic activities;Patient/family education;Therapeutic exercise    PT Goals (Current goals can be found in the Care Plan section)  Acute Rehab PT Goals Patient Stated Goal: to go home PT Goal Formulation: With patient Time For Goal Achievement: 05/16/17 Potential to Achieve Goals: Fair    Frequency Min 3X/week   Barriers to discharge Decreased caregiver support      Co-evaluation               AM-PAC PT  "6 Clicks" Daily Activity  Outcome Measure Difficulty turning over in bed (including adjusting bedclothes, sheets and blankets)?: Total Difficulty moving from lying on back to sitting on the side of the bed? : Total Difficulty sitting down on and standing up from a chair with arms (e.g., wheelchair, bedside commode, etc,.)?: Total Help needed moving to and from a bed to chair (including a wheelchair)?: Total Help needed walking in hospital room?: Total Help needed climbing 3-5 steps with a railing? : Total 6 Click Score: 6    End of Session Equipment Utilized During Treatment: Oxygen Activity Tolerance: Patient limited by fatigue Patient left: in chair;with call bell/phone within reach;with chair alarm set Nurse Communication: Mobility status PT Visit Diagnosis: Difficulty in walking, not elsewhere classified (R26.2);Muscle weakness (generalized) (M62.81)    Time: 2683-4196 PT Time Calculation (min) (ACUTE ONLY): 15 min   Charges:   PT Evaluation $PT Eval Low Complexity: 1 Procedure     PT G CodesTresa Endo PT [phone]   [name] 05/02/2017, 12:41 PM

## 2017-05-03 ENCOUNTER — Inpatient Hospital Stay (HOSPITAL_COMMUNITY): Payer: Medicare Other

## 2017-05-03 DIAGNOSIS — R1084 Generalized abdominal pain: Secondary | ICD-10-CM

## 2017-05-03 LAB — CBC
HCT: 28.2 % — ABNORMAL LOW (ref 36.0–46.0)
HEMOGLOBIN: 9.4 g/dL — AB (ref 12.0–15.0)
MCH: 26.6 pg (ref 26.0–34.0)
MCHC: 33.3 g/dL (ref 30.0–36.0)
MCV: 79.9 fL (ref 78.0–100.0)
PLATELETS: 382 10*3/uL (ref 150–400)
RBC: 3.53 MIL/uL — ABNORMAL LOW (ref 3.87–5.11)
RDW: 17.7 % — AB (ref 11.5–15.5)
WBC: 16.9 10*3/uL — ABNORMAL HIGH (ref 4.0–10.5)

## 2017-05-03 LAB — BASIC METABOLIC PANEL
Anion gap: 8 (ref 5–15)
BUN: 15 mg/dL (ref 6–20)
CALCIUM: 7.9 mg/dL — AB (ref 8.9–10.3)
CO2: 21 mmol/L — ABNORMAL LOW (ref 22–32)
CREATININE: 1 mg/dL (ref 0.44–1.00)
Chloride: 102 mmol/L (ref 101–111)
GFR calc Af Amer: 57 mL/min — ABNORMAL LOW (ref >60)
GFR, EST NON AFRICAN AMERICAN: 50 mL/min — AB (ref >60)
Glucose, Bld: 93 mg/dL (ref 65–99)
Potassium: 3.6 mmol/L (ref 3.5–5.1)
SODIUM: 131 mmol/L — AB (ref 135–145)

## 2017-05-03 LAB — HEPARIN LEVEL (UNFRACTIONATED): HEPARIN UNFRACTIONATED: 0.57 [IU]/mL (ref 0.30–0.70)

## 2017-05-03 MED ORDER — ACETAMINOPHEN 325 MG PO TABS
325.0000 mg | ORAL_TABLET | Freq: Four times a day (QID) | ORAL | Status: DC
  Administered 2017-05-03 – 2017-05-08 (×18): 325 mg via ORAL
  Filled 2017-05-03 (×19): qty 1

## 2017-05-03 MED ORDER — PANTOPRAZOLE SODIUM 40 MG IV SOLR
40.0000 mg | Freq: Two times a day (BID) | INTRAVENOUS | Status: DC
  Administered 2017-05-03 – 2017-05-08 (×11): 40 mg via INTRAVENOUS
  Filled 2017-05-03 (×11): qty 40

## 2017-05-03 MED ORDER — PROMETHAZINE HCL 25 MG/ML IJ SOLN
12.5000 mg | Freq: Four times a day (QID) | INTRAMUSCULAR | Status: DC | PRN
  Administered 2017-05-03 (×2): 12.5 mg via INTRAVENOUS
  Filled 2017-05-03 (×2): qty 1

## 2017-05-03 MED ORDER — HYDROMORPHONE HCL-NACL 0.5-0.9 MG/ML-% IV SOSY: 0.5000 mg | PREFILLED_SYRINGE | INTRAVENOUS | Status: DC | PRN

## 2017-05-03 MED ORDER — MORPHINE SULFATE (PF) 4 MG/ML IV SOLN: 1.0000 mg | INTRAVENOUS | Status: DC | PRN

## 2017-05-03 MED ORDER — OXYCODONE HCL 5 MG PO TABS
5.0000 mg | ORAL_TABLET | ORAL | Status: DC | PRN
  Administered 2017-05-03 (×2): 10 mg via ORAL
  Administered 2017-05-04 (×3): 5 mg via ORAL
  Administered 2017-05-05 (×2): 10 mg via ORAL
  Administered 2017-05-05 – 2017-05-06 (×3): 5 mg via ORAL
  Administered 2017-05-07 (×3): 10 mg via ORAL
  Administered 2017-05-08: 5 mg via ORAL
  Filled 2017-05-03: qty 1
  Filled 2017-05-03: qty 2
  Filled 2017-05-03 (×4): qty 1
  Filled 2017-05-03 (×3): qty 2
  Filled 2017-05-03: qty 1
  Filled 2017-05-03 (×3): qty 2
  Filled 2017-05-03: qty 1

## 2017-05-03 MED ORDER — SODIUM CHLORIDE 0.9 % IV SOLN
INTRAVENOUS | Status: DC
  Administered 2017-05-03 – 2017-05-05 (×3): via INTRAVENOUS

## 2017-05-03 NOTE — Care Management Note (Signed)
Case Management Note  Patient Details  Name: Charlene [name] MRN: [id number] Date of Birth: June 26, 1928  Subjective/Objective:83 y/o f admitted w/abd pain.SBO. POD#6 diverting loop colostomy. L hydronephrosis-hematuria-f/c, l leg DVT. Clears,hep gtt,iv protonix. Palliative following.PT-recc SNF.                    Action/Plan:d/c SNF.   Expected Discharge Date:                 Expected Discharge Plan:  Skilled Nursing Facility  In-House Referral:  Clinical Social Work  Discharge planning Services  CM Consult  Post Acute Care Choice:    Choice offered to:     DME Arranged:    DME Agency:     HH Arranged:    [place] Agency:     Status of Service:  In process, will continue to follow  If discussed at Long Length of Stay Meetings, dates discussed:    Additional Comments:  Dessa Phi, RN 05/03/2017, 2:24 PM

## 2017-05-03 NOTE — Progress Notes (Signed)
[facility]/Trauma Progress Note  5 Days Post-Op   Assessment/Plan Metastatic colon cancer to liver Large bowel obstruction S/p LAPAROSCOPIC ASSISTED DIVERTING LOOP COLOSTOMY 7/25 Dr. Ninfa Linden - POD 5 - awaiting return in bowel function - WBC trending down, afebrile  Renal insufficiency/Left hydronephrosis - per urology Anemia - Hg 10.2-->9 today DVT - on eliquis at home  ID - rocephin 7/22>>7/24. Cefotetan perioperative, ua neg VTE - heparin gtt, hgb stable. Daily cbc. would wait another day or so before restarting oral anticoagulant since no reversal agent.  FEN - IVF, clears with suspicion of ileus  Protein calorie malnutrition- prealb 10. Foley: continue per urology Follow up: Dr. Ninfa Linden  DISPO: encourage ambulation, palliative care to speak to pt and son, abd xray pending, await return of bowel function. Will discuss ostomy bridge with MD. Oxy scale for better pain control. Haldol if nausea persists. May need NGT if vomiting persists.     LOS: 7 days    Subjective:  CC; abdominal pain and nausea  Pt states her abdominal pain is unchanged from yesterday. She feels her belly is slightly more swollen on the upper right side. She continues to have nausea and nonbilious vomiting. She has not been walking. Her son was at the bedside. She states she is tired of being in pain and doesn't want to live any longer. Son stressed he would like palliative care coming back to speak to them. She had fecal disimpaction yesterday which helped a little but still feels like she needs to have a BM. Pt states small amount of gas from stoma yesterday. No stool yet in bag.   Objective: Vital signs in last 24 hours: Temp:  [98 F (36.7 C)-98.4 F (36.9 C)] 98.4 F (36.9 C) (07/30 0531) Pulse Rate:  [63-69] 68 (07/30 0531) Resp:  [16-20] 20 (07/30 0531) BP: (153-194)/(57-64) 171/64 (07/30 0702) SpO2:  [95 %-98 %] 95 % (07/30 0531) Last BM Date: 05/01/17  Intake/Output  from previous day: 07/29 0701 - 07/30 0700 In: 225.3 [I.V.:225.3] Out: 650 [Urine:650] Intake/Output this shift: No intake/output data recorded.  PE: Gen:  Alert, NAD, cooperative Card:  RRR, no M/G/R heard Pulm:  CTA anteriorly, no W/R/R, effort normal Abd: Soft, mild distention worse in RUQ, normal BS mixed with tinkling, incisions C/D/I, ostomy with serosanguinous fluid in bag no gas Skin: no rashes noted, warm and dry   Anti-infectives: Anti-infectives    Start     Dose/Rate Route Frequency Ordered Stop   04/28/17 1334  cefoTEtan in Dextrose 5% (CEFOTAN) 2-2.08 GM-% IVPB    Comments:  Tamera Punt, [place]   : cabinet override      04/28/17 1334 04/28/17 1405   04/28/17 1215  cefoTEtan in Dextrose 5% (CEFOTAN) IVPB 2 g     2 g Intravenous On call to O.R. 04/27/17 1255 04/28/17 1420   04/28/17 0600  cefoTEtan (CEFOTAN) 2 g in dextrose 5 % 50 mL IVPB  Status:  Discontinued     2 g 100 mL/hr over 30 Minutes Intravenous On call to O.R. 04/27/17 1233 04/27/17 1255   04/25/17 2100  cefTRIAXone (ROCEPHIN) 1 g in dextrose 5 % 50 mL IVPB  Status:  Discontinued     1 g 100 mL/hr over 30 Minutes Intravenous Every 24 hours 04/25/17 2052 04/27/17 1220      Lab Results:   Recent Labs  05/02/17 0121 05/03/17 0456  WBC 18.8* 16.9*  HGB 9.5* 9.4*  HCT 28.0* 28.2*  PLT 325 382   BMET  Recent Labs  05/02/17 0121 05/03/17 0456  NA 132* 131*  K 3.5 3.6  CL 103 102  CO2 20* 21*  GLUCOSE 86 93  BUN 14 15  CREATININE 0.99 1.00  CALCIUM 7.9* 7.9*   PT/INR No results for input(s): LABPROT, INR in the last 72 hours. CMP     Component Value Date/Time   NA 131 (L) 05/03/2017 0456   NA 136 04/19/2017 1044   K 3.6 05/03/2017 0456   K 4.5 04/19/2017 1044   CL 102 05/03/2017 0456   CO2 21 (L) 05/03/2017 0456   CO2 23 04/19/2017 1044   GLUCOSE 93 05/03/2017 0456   GLUCOSE 96 04/19/2017 1044   BUN 15 05/03/2017 0456   BUN 19.9 04/19/2017 1044   CREATININE 1.00 05/03/2017 0456    CREATININE 1.4 (H) 04/19/2017 1044   CALCIUM 7.9 (L) 05/03/2017 0456   CALCIUM 9.7 04/19/2017 1044   PROT 6.3 (L) 04/26/2017 0443   ALBUMIN 3.1 (L) 04/26/2017 0443   AST 21 04/26/2017 0443   ALT 13 (L) 04/26/2017 0443   ALKPHOS 49 04/26/2017 0443   BILITOT 0.7 04/26/2017 0443   GFRNONAA 50 (L) 05/03/2017 0456   GFRAA 57 (L) 05/03/2017 0456   Lipase     Component Value Date/Time   LIPASE 24 04/25/2017 1504    Studies/Results: Dg Abd 1 View  Result Date: 05/01/2017 CLINICAL DATA:  Patient with lower abdominal pain. Prior colonoscopy. Prior ileal loop diversion. EXAM: ABDOMEN - 1 VIEW COMPARISON:  CT abdomen pelvis 04/25/2017 FINDINGS: Prominent gas cysts distended loop of bowel within the right lower quadrant, potentially represents gaseous distended colon. Oral contrast material within the bowel. Prominent loops of small bowel within the central abdomen. Supine evaluation limited for the detection of free intraperitoneal air. Pelvic surgical clips. Lumbar spine degenerative changes. IMPRESSION: Gaseous distended loop of bowel within the right lower quadrant favored to represent cecum. Mildly gaseous distended loops of small bowel. Overall findings may represent ileus. Electronically Signed   By: Lovey Newcomer M.D.   On: 05/01/2017 15:54      Kalman Drape , [facility] 05/03/2017, 12:09 PM Pager: [phone] Consults: [phone] Mon-Fri 7:00 am-4:30 pm Sat-Sun 7:00 am-11:30 am

## 2017-05-03 NOTE — Progress Notes (Signed)
Physical Therapy Treatment Patient Details Name: Charlene [name] MRN: [id number] DOB: 1928/01/06 Today's Date: 05/03/2017    History of Present Illness Patient is a 83 y.o. female with metastatic colon cancer with extensive pelvic disease causing left ureteral obstruction, DVT on eliquis,. Patient presented with fatigue, severe constipation and abdominal pain .S/P LAPAROSCOPIC ASSISTED DIVERTING  LOOP COLOSTOMY on 7/25.    PT Comments    Pt having increased nausea and feeling "tired" but did agree to try.  Assisted to EOB with increased assist this session.  Attempted amb however unable due to above stated.  Was only able to take a few side steps to [facility].  Assisted back to bed and positioned to comfort.    Follow Up Recommendations  SNF;Supervision/Assistance - 24 hour (pt wants to go home)     Equipment Recommendations  None recommended by PT    Recommendations for Other Services       Precautions / Restrictions Precautions Precautions: Fall Precaution Comments: colostomy Restrictions Weight Bearing Restrictions: No    Mobility  Bed Mobility Overal bed mobility: Needs Assistance Bed Mobility: Supine to Sit;Sit to Supine     Supine to sit: Mod assist Sit to supine: Mod assist   General bed mobility comments: HOB raised, assist with trunk    increased assist needed this session due to ABD pain and c/o nausea and feeling "tired"  Transfers Overall transfer level: Needs assistance Equipment used: Rolling walker (2 wheeled) Transfers: Sit to/from Stand Sit to Stand: Min assist         General transfer comment: sit to stand twice with Min Assist and 50% VC's on proper hand placement to   Ambulation/Gait Ambulation/Gait assistance: Min assist Ambulation Distance (Feet): 1 Feet Assistive device: Rolling walker (2 wheeled) Gait Pattern/deviations: Step-to pattern     General Gait Details: pt only able to make a few side steps to [facility] due to increased c/o of weakness and  nausea.     Stairs            Wheelchair Mobility    Modified Rankin (Stroke Patients Only)       Balance                                            Cognition Arousal/Alertness: Awake/alert Behavior During Therapy: WFL for tasks assessed/performed Overall Cognitive Status: Within Functional Limits for tasks assessed                                        Exercises      General Comments        Pertinent Vitals/Pain Pain Assessment: 0-10 Pain Score: 3  Pain Location: abdomen Pain Descriptors / Indicators: Discomfort;Grimacing;Guarding Pain Intervention(s): Monitored during session;Repositioned    Home Living                      Prior Function            PT Goals (current goals can now be found in the care plan section) Progress towards PT goals: Progressing toward goals    Frequency    Min 3X/week      PT Plan Current plan remains appropriate    Co-evaluation  AM-PAC PT "6 Clicks" Daily Activity  Outcome Measure  Difficulty turning over in bed (including adjusting bedclothes, sheets and blankets)?: Total Difficulty moving from lying on back to sitting on the side of the bed? : Total Difficulty sitting down on and standing up from a chair with arms (e.g., wheelchair, bedside commode, etc,.)?: Total Help needed moving to and from a bed to chair (including a wheelchair)?: Total Help needed walking in hospital room?: Total Help needed climbing 3-5 steps with a railing? : Total 6 Click Score: 6    End of Session   Activity Tolerance: Patient limited by fatigue;Patient limited by pain Patient left: in bed;with call bell/phone within reach Nurse Communication: Mobility status PT Visit Diagnosis: Difficulty in walking, not elsewhere classified (R26.2);Muscle weakness (generalized) (M62.81)     Time: 6067-7034 PT Time Calculation (min) (ACUTE ONLY): 35 min  Charges:  $Gait  Training: 8-22 mins $Therapeutic Activity: 8-22 mins                    G Codes:       Rica Koyanagi  PTA [facility]  Acute  Rehab Pager      [phone]

## 2017-05-03 NOTE — Progress Notes (Signed)
PROGRESS NOTE    Charlene [name]  SWF:[id number] DOB: 05-29-1928 DOA: 04/25/2017 PCP: Shon Baton, MD    Brief Narrative:  Charlene [name] is a 83 yo female with PMHx of metastatic colon cancer to liver who presents with lower abdominal pain, lack of bowel movement in > 1 week, nausea. CT abdomen and pelvis was completed emergency department, which showed small liver metastases, persistent left hydronephrosis due to ureteral obstruction in the left pelvic retroperitoneum, large bowel obstruction with a large amount of fecal matter proximal to this region. Urology, general surgery, gastroenterology consulted. Patient underwent diverting loop colostomy on 04/28/2017.     Assessment & Plan:   Principal Problem:   Abdominal pain Active Problems:   UTI (urinary tract infection)   Renal insufficiency   Anemia   Hypertension   Large bowel obstruction (HCC)   Malignant neoplasm of sigmoid colon (HCC)   Hydronephrosis   Encounter for palliative care   Goals of care, counseling/discussion   Pyuria   Deep vein thrombosis (DVT) of lower extremity (HCC)   Gross hematuria   Atelectasis  Metastatic colon cancer to the liver /Large bowel obstruction  GI and general surgery has been consulting.  Per GI, colonic stent is not feasible due to tumor length and tortuosity of colon.  Status post open sigmoidectomy with repair of colovesicular fistula 01/14/2017 per Dr. Marcello Moores.  General surgery/GI recommended diverting colostomy which was done 04/28/2017.. S/p Enema and digital disimpaction with no significant results  Patient still with abdominal pain, vomiting. I will order KUB.  Surgery will follow up on patient today. Might need NG tube, defer to sx.    Acute kidney injury secondary to obstruction, with left hydronephrosis and complete left urethral obstruction Baseline Cr 0.63. Urology consulted, no emergent indication for intervention at this point.  Patient s/p cystoscopy with retrograde  pyelogram/ per urology 04/28/2017 which noted a complete left urethral obstruction and inability to cannulate left urethra and passed Y have beyond the UVJ. 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  Follow renal function with electrolytes and avoid nephrotoxins. Cr stable currently. Monitor I/O  Outpatient follow-up with urology.  Hyponatremia; will start IV fluids.   Pyuria Urine culture with contamination. IV Rocephin discontinued.   Metastatic colon cancer to the liver s/p open sigmoidectomy with repair of colovesicular fistula  Patient follows with Dr. Benay Spice Palliative care also following   Anemia of chronic disease Hgb stable, monitor   DVT Held eliquis pending surgical intervention. Started heparin gtt, which was held 6 hours prior to surgery.  Would continue with heparin Gtt for now, hematuria has decreased, no further blood from colostomy.    Gross hematuria Patient noted to have gross hematuria late yesterday afternoon. Foley catheter was flushed and small clots noted. Foley catheter was subsequently removed overnight due to patient's complaints of significant lower abdominal pain and spasms. Larger Foley catheter to be placed today and flushed. Urology following. Hb stable.   Leukocytosis Patient with a worsening leukocytosis.  WBC trending down.  Urine culture from 7-27; no growth to date, blood culture; no growth to date,   DVT prophylaxis: SCD/heparin drip Code Status: DO NOT RESUSCITATE Family Communication: Updated patient and family at bedside. Disposition Plan: Pending consultative service and surgical intervention and per general surgery.   Consultants:   Palliative care Dr. Domingo Cocking 04/25/2017  Urology Dr. Alyson Ingles 04/25/2017  Gen. surgery Dr. Excell Seltzer 04/26/2017  Gastroenterology Dr.Danis III 04/26/2017  Wound care  Oncology Dr. Benay Spice 04/27/2017  Procedures:   CT abdomen and pelvis  04/25/2017  Laparoscopic-assisted diverting loop colostomy per Dr. Ninfa Linden general surgery 04/28/2017  Cystourethroscopy, left retrograde pyelogram, intraoperative fluoroscopy with interpretation per Dr. Alinda Money 04/28/2017  Antimicrobials:   IV Rocephin 04/25/2017>>>> 04/27/2017   Subjective: She is still having abdominal pain, rate pain 9/10.  She vomited last night.  Having hiccup./   Objective: Vitals:   05/02/17 1628 05/02/17 2040 05/03/17 0531 05/03/17 0702  BP: (!) 190/64 (!) 153/57 (!) 194/62 (!) 171/64  Pulse: 69 66 68   Resp: 16 18 20    Temp:  98 F (36.7 C) 98.4 F (36.9 C)   TempSrc:  Oral Oral   SpO2: 97% 96% 95%   Weight:      Height:        Intake/Output Summary (Last 24 hours) at 05/03/17 1108 Last data filed at 05/03/17 0600  Gross per 24 hour  Intake           225.29 ml  Output              650 ml  Net          -424.71 ml   Filed Weights   04/26/17 0550 04/27/17 0610 04/28/17 1321  Weight: 57.8 kg (127 lb 6.8 oz) 58.6 kg (129 lb 1.6 oz) 58.5 kg (129 lb)    Examination:  General exam: NAD Respiratory system: CTA, Respiratory effort normal.  Cardiovascular system: S 1, S 2 RRR Gastrointestinal system: abdomen mild distended, tender to palpation.  Ostomy intact, no blood in pouch  Central nervous system: Alert, non focal.  Extremities: Symmetric 5 x 5 power. Skin: no rash or lesion. Genitourinary: Foley catheter in place. Urine dark    Data Reviewed: I have personally reviewed following labs and imaging studies  CBC:  Recent Labs Lab 04/27/17 0421 04/29/17 0456 04/30/17 0435 05/01/17 0502 05/02/17 0121 05/03/17 0456  WBC 10.0 11.8* 21.0* 18.5* 18.8* 16.9*  NEUTROABS 8.0* 11.1*  --   --   --   --   HGB 9.6* 10.2* 10.2* 9.0* 9.5* 9.4*  HCT 30.0* 31.8* 31.1* 26.7* 28.0* 28.2*  MCV 81.7 81.3 79.5 79.2 79.3 79.9  PLT 318 367 421* 356 325 784   Basic Metabolic Panel:  Recent Labs Lab 04/28/17 0833 04/29/17 0456 04/30/17 0435  05/01/17 0502 05/02/17 0121 05/03/17 0456  NA  --  133* 133* 133* 132* 131*  K  --  4.7 3.6 3.4* 3.5 3.6  CL  --  106 107 103 103 102  CO2  --  16* 19* 22 20* 21*  GLUCOSE  --  114* 132* 114* 86 93  BUN  --  17 19 15 14 15   CREATININE  --  1.20* 1.07* 1.14* 0.99 1.00  CALCIUM  --  8.0* 7.8* 7.9* 7.9* 7.9*  MG 1.8 1.7  --   --   --   --    GFR: Estimated Creatinine Clearance: 34.9 mL/min (by C-G formula based on SCr of 1 mg/dL). Liver Function Tests: No results for input(s): AST, ALT, ALKPHOS, BILITOT, PROT, ALBUMIN in the last 168 hours. No results for input(s): LIPASE, AMYLASE in the last 168 hours. No results for input(s): AMMONIA in the last 168 hours. Coagulation Profile: No results for input(s): INR, PROTIME in the last 168 hours. Cardiac Enzymes: No results for input(s): CKTOTAL, CKMB, CKMBINDEX, TROPONINI in the last 168 hours. BNP (last 3 results) No results for input(s): PROBNP in the last 8760  hours. HbA1C: No results for input(s): HGBA1C in the last 72 hours. CBG: No results for input(s): GLUCAP in the last 168 hours. Lipid Profile: No results for input(s): CHOL, HDL, LDLCALC, TRIG, CHOLHDL, LDLDIRECT in the last 72 hours. Thyroid Function Tests: No results for input(s): TSH, T4TOTAL, FREET4, T3FREE, THYROIDAB in the last 72 hours. Anemia Panel: No results for input(s): VITAMINB12, FOLATE, FERRITIN, TIBC, IRON, RETICCTPCT in the last 72 hours. Sepsis Labs: No results for input(s): PROCALCITON, LATICACIDVEN in the last 168 hours.  Recent Results (from the past 240 hour(s))  Urine culture     Status: Abnormal   Collection Time: 04/25/17 10:46 PM  Result Value Ref Range Status   Specimen Description URINE, CLEAN CATCH  Final   Special Requests NONE  Final   Culture MULTIPLE SPECIES PRESENT, SUGGEST RECOLLECTION (A)  Final   Report Status 04/27/2017 FINAL  Final  Surgical pcr screen     Status: None   Collection Time: 04/27/17  9:18 PM  Result Value Ref Range  Status   MRSA, PCR NEGATIVE NEGATIVE Final   Staphylococcus aureus NEGATIVE NEGATIVE Final    Comment:        The Xpert SA Assay (FDA approved for NASAL specimens in patients over 1 years of age), is one component of a comprehensive surveillance program.  Test performance has been validated by [facility] for patients greater than or equal to 52 year old. It is not intended to diagnose infection nor to guide or monitor treatment.   Culture, blood (Routine X 2) w Reflex to ID Panel     Status: None (Preliminary result)   Collection Time: 04/30/17 10:02 AM  Result Value Ref Range Status   Specimen Description BLOOD LEFT HAND  Final   Special Requests AEROBIC BOTTLE ONLY Blood Culture adequate volume  Final   Culture   Final    NO GROWTH 2 DAYS Performed at [facility], [place] [address], [place], [place] [address]    Report Status PENDING  Incomplete  Culture, blood (Routine X 2) w Reflex to ID Panel     Status: None (Preliminary result)   Collection Time: 04/30/17 10:02 AM  Result Value Ref Range Status   Specimen Description BLOOD LEFT HAND  Final   Special Requests AEROBIC BOTTLE ONLY Blood Culture adequate volume  Final   Culture   Final    NO GROWTH 2 DAYS Performed at [facility], [place] [address], [place], [place] [address]    Report Status PENDING  Incomplete  Culture, Urine     Status: Abnormal   Collection Time: 04/30/17 12:14 PM  Result Value Ref Range Status   Specimen Description URINE, CATHETERIZED  Final   Special Requests NONE  Final   Culture (A)  Final    <10,000 COLONIES/mL INSIGNIFICANT GROWTH Performed at [place] [address], [place], [place] [address]    Report Status 05/01/2017 FINAL  Final         Radiology Studies: Dg Abd 1 View  Result Date: 05/01/2017 CLINICAL DATA:  Patient with lower abdominal pain. Prior colonoscopy. Prior ileal loop diversion. EXAM: ABDOMEN - 1 VIEW COMPARISON:  CT abdomen pelvis 04/25/2017  FINDINGS: Prominent gas cysts distended loop of bowel within the right lower quadrant, potentially represents gaseous distended colon. Oral contrast material within the bowel. Prominent loops of small bowel within the central abdomen. Supine evaluation limited for the detection of free intraperitoneal air. Pelvic surgical clips. Lumbar spine degenerative  changes. IMPRESSION: Gaseous distended loop of bowel within the right lower quadrant favored to represent cecum. Mildly gaseous distended loops of small bowel. Overall findings may represent ileus. Electronically Signed   By: Lovey Newcomer M.D.   On: 05/01/2017 15:54        Scheduled Meds: . feeding supplement  1 Container Oral BID BM  . hydrocortisone   Rectal QID  . multivitamin-lutein  1 capsule Oral Daily  . senna-docusate  2 tablet Oral Daily  . sodium phosphate  1 enema Rectal Once   Continuous Infusions: . heparin 450 Units/hr (05/02/17 1950)  . methocarbamol (ROBAXIN)  IV Stopped (05/01/17 1049)     LOS: 7 days    Time spent: 21 mins    Elmarie Shiley, MD [facility] Pager [phone]  If 7PM-7AM, please contact night-coverage [email or web address] Password [email or web address] 05/03/2017, 11:08 AM

## 2017-05-03 NOTE — Progress Notes (Signed)
Daily Progress Note   Patient Name: Charlene [name]       Date: 05/03/2017 DOB: 12-16-1927  Age: 83 y.o. MRN#: [id number] Attending Physician: Elmarie Shiley, MD Primary Care Physician: Shon Baton, MD Admit Date: 04/25/2017  Reason for Consultation/Follow-up: symptom management, following disease trajectory to help guide decision making  Subjective:  patient is awake alert but appears to be in mild distress.  She has ongoing abdominal pain, she was nauseous and vomiting earlier today Surgery following, she is on phenergan, was started on IV Morphine PRN and PO Oxy IR PRN.   See below:   Length of Stay: 7  Current Medications: Scheduled Meds:  . acetaminophen  325 mg Oral Q6H  . feeding supplement  1 Container Oral BID BM  . hydrocortisone   Rectal QID  . multivitamin-lutein  1 capsule Oral Daily  . pantoprazole (PROTONIX) IV  40 mg Intravenous Q12H  . senna-docusate  2 tablet Oral Daily  . sodium phosphate  1 enema Rectal Once    Continuous Infusions: . sodium chloride 75 mL/hr at 05/03/17 1242  . heparin 450 Units/hr (05/02/17 1950)  . methocarbamol (ROBAXIN)  IV Stopped (05/01/17 1049)    PRN Meds: docusate sodium, hydrALAZINE, HYDROmorphone (DILAUDID) injection, hydroxypropyl methylcellulose / hypromellose, methocarbamol (ROBAXIN)  IV, ondansetron (ZOFRAN) IV, ondansetron, opium-belladonna, oxyCODONE, polyethylene glycol, promethazine, simethicone, sodium phosphate  Physical Exam         NAD Thin elderly lady Clear S1 S2 Abdomen not distended but firm.  +BS, passing gas Colostomy bag in LUQ,   No edema Awake alert oriented  Vital Signs: BP (!) 171/64 (BP Location: Left Arm)   Pulse 68   Temp 98.4 F (36.9 C) (Oral)   Resp 20   Ht 5\' 4"  (1.626 m)   Wt 58.5  kg (129 lb)   SpO2 95%   BMI 22.14 kg/m  SpO2: SpO2: 95 % O2 Device: O2 Device: Nasal Cannula O2 Flow Rate: O2 Flow Rate (L/min): 1 L/min  Intake/output summary:   Intake/Output Summary (Last 24 hours) at 05/03/17 1502 Last data filed at 05/03/17 0600  Gross per 24 hour  Intake           225.29 ml  Output              450 ml  Net          -  224.71 ml   LBM: Last BM Date: 05/01/17 Baseline Weight: Weight: 57.1 kg (125 lb 14.1 oz) Most recent weight: Weight: 58.5 kg (129 lb)       Palliative Assessment/Data: PPS 30%   Flowsheet Rows     Most Recent Value  Intake Tab  Referral Department  -- [ER]  Unit at Time of Referral  ER  Palliative Care Primary Diagnosis  Cancer  Date Notified  04/25/17  Palliative Care Type  New Palliative care  Reason for referral  Pain, Clarify Goals of Care  Date of Admission  04/25/17  Date first seen by Palliative Care  04/25/17  # of days Palliative referral response time  0 Day(s)  # of days IP prior to Palliative referral  0  Clinical Assessment  Palliative Performance Scale Score  50%  Pain Max last 24 hours  9  Pain Min Last 24 hours  4  Psychosocial & Spiritual Assessment  Palliative Care Outcomes  Patient/Family meeting held?  Yes  Who was at the meeting?  Patient, son, daughter in law      Patient Active Problem List   Diagnosis Date Noted  . Atelectasis   . Gross hematuria 04/30/2017  . Pyuria   . Deep vein thrombosis (DVT) of lower extremity ([place])   . Malignant neoplasm of sigmoid colon ([place])   . Hydronephrosis   . Encounter for palliative care   . Goals of care, counseling/discussion   . Large bowel obstruction ([facility]) 04/26/2017  . Abdominal pain 04/25/2017  . UTI (urinary tract infection) 04/25/2017  . Renal insufficiency 04/25/2017  . Anemia 04/25/2017  . Hypertension 04/25/2017  . Pelvic abscess in female 02/02/2017  . Colovesical fistula with cancer s/p colectomy/repair 01/14/2017 01/18/2017  . 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 Protein-calorie malnutrition, severe 02/01/2016  . Depression 01/31/2016  . GERD (gastroesophageal reflux disease) 01/31/2016  . Hemorrhoids 01/31/2016  . Acute diverticulitis 05/05/2015  . Abnormal LFTs 05/05/2015  . Dyslipidemia 03/13/2015  . Leukocytosis 03/13/2015  . Acute renal failure ([place]) 03/13/2015    Palliative Care Assessment & Plan   Patient Profile:    Assessment:  metastatic colon cancer Constipation Abdominal pain Large bowel obstruction  Recommendations/Plan:  s/p diverting colostomy     surgery following    I had a long talk with Charlene [name], she was by herself, her son is an Art therapist at [employer], he is currently at work.   Charlene [name] states she feels tired and over burdened with her illness, she appears frail, she is hardly eating anything, she has basically no stool output through her ostomy. She has some degree of existential suffering, she talks about not suffering any more. She states that she has lived a healthy life for 89 years and that should be enough. She has started these conversations with her family.   We then discussed about goals, wishes and values. I discussed with her about SNF rehab+ following up with onc as outpatient versus choosing comfort as a singular goal and considering residential hospice on d/c. She is familiar with [facility] and we talked some about the type of care that is offered over there.  I will d/c IV Morphine PRN and start IV Dilaudid PRN and monitor her pain medicine needs, in addition to the PO Oxy IR.  Continue phenergan She is reportedly to get another enema this afternoon   Will continue to follow and help guide appropriate disposition.    Code  Status:    Code Status Orders        Start     Ordered   04/25/17 2150  Do not attempt resuscitation (DNR)  Continuous    Question Answer Comment  In the event of cardiac or respiratory ARREST Do not  call a "code blue"   In the event of cardiac or respiratory ARREST Do not perform Intubation, CPR, defibrillation or ACLS   In the event of cardiac or respiratory ARREST Use medication by any route, position, wound care, and other measures to relive pain and suffering. May use oxygen, suction and manual treatment of airway obstruction as needed for comfort.      04/25/17 2149    Code Status History    Date Active Date Inactive Code Status Order ID Comments User Context   02/03/2017  2:36 AM 02/07/2017  8:41 PM DNR [id number]  Nila Nephew, RN Inpatient   02/02/2017  7:22 PM 02/03/2017  2:36 AM Full Code [id number]  Armandina Gemma, MD ED   01/11/2017  9:26 AM 01/21/2017  5:14 PM Full Code [id number]  Leighton Ruff, MD Inpatient   01/31/2016  2:09 AM 02/02/2016  3:30 PM Full Code [id number]  Vianne Bulls, MD Inpatient   05/05/2015  9:12 PM 05/08/2015  1:15 PM Full Code [id number]  Reubin Milan, MD Inpatient   03/13/2015  5:20 PM 03/15/2015  5:34 PM Full Code [id number]  Robbie Lis, MD Inpatient       Prognosis:   guarded  Discharge Planning:  SNF rehab with palliative versus residential hospice.   Care plan was discussed with  Patient, bedside RN, TRH MD, surgery Thank you for allowing the Palliative Medicine Team to assist in the care of this patient.   Time In: 1430 Time Out: 1500 Total Time 30 Prolonged Time Billed  no       Greater than 50%  of this time was spent counseling and coordinating care related to the above assessment and plan.  Loistine Chance, MD [phone]  Please contact Palliative Medicine Team phone at [phone] for questions and concerns.

## 2017-05-03 NOTE — Evaluation (Signed)
Occupational Therapy Evaluation Patient Details Name: Charlene [name] MRN: [id number] DOB: 1928-05-17 Today's Date: 05/03/2017    History of Present Illness Patient is a 83 y.o. female with metastatic colon cancer with extensive pelvic disease causing left ureteral obstruction, DVT on eliquis,. Patient presented with fatigue, severe constipation and abdominal pain .S/P LAPAROSCOPIC ASSISTED DIVERTING  LOOP COLOSTOMY on 7/25.   Clinical Impression   Pt admitted with fatigue and constipation. Pt currently with functional limitations due to the deficits listed below (see OT Problem List).  Pt will benefit from skilled OT to increase their safety and independence with ADL and functional mobility for ADL to facilitate discharge to venue listed below.      Follow Up Recommendations  SNF    Equipment Recommendations  None recommended by OT       Precautions / Restrictions Precautions Precautions: Fall Precaution Comments: ostomy leaks Restrictions Weight Bearing Restrictions: No      Mobility Bed Mobility Overal bed mobility: Needs Assistance Bed Mobility: Sit to Supine     Supine to sit: Min assist Sit to supine: Min assist   General bed mobility comments: HOB raised, assist with trunk   Transfers Overall transfer level: Needs assistance Equipment used: Rolling walker (2 wheeled) Transfers: Sit to/from [facility] Sit to Stand: Min assist Stand pivot transfers: Min assist                ADL either performed or assessed with clinical judgement   ADL Overall ADL's : Needs assistance/impaired Eating/Feeding: Minimal assistance;Sitting   Grooming: Minimal assistance;Sitting   Upper Body Bathing: Minimal assistance;Sitting   Lower Body Bathing: Maximal assistance;Sit to/from stand;Cueing for safety;Cueing for sequencing   Upper Body Dressing : Set up;Sitting   Lower Body Dressing: Maximal assistance;Sit to/from stand;Cueing for safety;Cueing for  sequencing;Cueing for compensatory techniques   Toilet Transfer: Minimal assistance Toilet Transfer Details (indicate cue type and reason): chair to bed           General ADL Comments: pain and fatigue limiting pt this day     Vision Patient Visual Report: No change from baseline       Perception     Praxis      Pertinent Vitals/Pain Faces Pain Scale: Hurts even more Pain Location: abdomen Pain Descriptors / Indicators: Discomfort;Grimacing;Guarding Pain Intervention(s): Limited activity within patient's tolerance           Communication Communication Communication: No difficulties   Cognition Arousal/Alertness: Awake/alert Behavior During Therapy: WFL for tasks assessed/performed Overall Cognitive Status: Within Functional Limits for tasks assessed                                                Home Living Family/patient expects to be discharged to:: Skilled nursing facility Living Arrangements: Alone   Type of Home: House Home Access: Stairs to enter Technical brewer of Steps: 1 Entrance Stairs-Rails: None Home Layout: One level     Bathroom Shower/Tub: Occupational psychologist: Standard     Home Equipment: None   Additional Comments: patient is home a;lone               OT Problem List: Decreased strength;Decreased activity tolerance;Decreased safety awareness;Decreased knowledge of use of DME or AE      OT Treatment/Interventions: Self-care/ADL training;Energy conservation;DME and/or AE instruction;Patient/family education    OT Goals(Current  goals can be found in the care plan section)    OT Frequency: Min 2X/week   Barriers to D/C: Decreased caregiver support             AM-PAC PT "6 Clicks" Daily Activity     Outcome Measure Help from another person eating meals?: A Little Help from another person taking care of personal grooming?: A Little Help from another person toileting, which includes  using toliet, bedpan, or urinal?: A Lot Help from another person bathing (including washing, rinsing, drying)?: A Lot Help from another person to put on and taking off regular upper body clothing?: A Little Help from another person to put on and taking off regular lower body clothing?: A Lot 6 Click Score: 15   End of Session Nurse Communication: Mobility status  Activity Tolerance: Patient tolerated treatment well Patient left: in bed;with call bell/phone within reach;with bed alarm set;with family/visitor present  OT Visit Diagnosis: Unsteadiness on feet (R26.81);Muscle weakness (generalized) (M62.81)                Time: 3354-5625 OT Time Calculation (min): 15 min Charges:  OT General Charges $OT Visit: 1 Procedure OT Evaluation $OT Eval Moderate Complexity: 1 Procedure G-Codes:     Kari Baars, [place]  Payton Mccallum D 05/03/2017, 12:40 PM

## 2017-05-03 NOTE — Progress Notes (Signed)
OT Cancellation Note  Patient Details Name: CLARICE ZULAUF MRN: [id number] DOB: 1927/10/12   Cancelled Treatment:    Reason Eval/Treat Not Completed: Patient at procedure or test/ unavailable  Pt having x ray - will check on pt later in day or next day Kari Baars, [place]  Payton Mccallum D 05/03/2017, 12:13 PM

## 2017-05-03 NOTE — Progress Notes (Signed)
ANTICOAGULATION CONSULT NOTE    Pharmacy Consult for heparin Indication: DVT - bridge while off apixaban for surgery  No Known Allergies  Patient Measurements: Height: 5\' 4"  (162.6 cm) Weight: 129 lb (58.5 kg) IBW/kg (Calculated) : 54.7 Heparin Dosing Weight: 59kg  Vital Signs: Temp: 98.4 F (36.9 C) (07/30 0531) Temp Source: Oral (07/30 0531) BP: 171/64 (07/30 0702) Pulse Rate: 68 (07/30 0531)  Labs:  Recent Labs  05/01/17 0502 05/01/17 1545 05/02/17 0121 05/02/17 0939 05/02/17 1812 05/03/17 0456  HGB 9.0*  --  9.5*  --   --  9.4*  HCT 26.7*  --  28.0*  --   --  28.2*  PLT 356  --  325  --   --  382  APTT 103* 28 41*  --   --   --   HEPARINUNFRC 0.91* 0.61 0.73* 0.76* 0.64 0.57  CREATININE 1.14*  --  0.99  --   --  1.00    Estimated Creatinine Clearance: 34.9 mL/min (by C-G formula based on SCr of 1 mg/dL).   Assessment: 6 YOF with a large bowel obstruction with a history of metastatic colon cancer.  She has a history of DVT on apixaban 2.5mg  BID (dose reduced due to bleeding, advanced age & decreased renal function).  Apixaban on hold since patient going to OR 7/25 and bridging with IV heparin.   Last dose of apixaban was 7/22 at 0930  Baseline aPTT = 41 sec,  HL = 1.46 (falsely elevated due to effects of apixaban),  INR = 1.37  05/03/2017:  Heparin level 0.57 (therapeutic)  Hg low, but stable.  Pltc WNL  Hematuria stable  per RN   Goal of Therapy:  Heparin level 0.3-0.7 (target low end of goal range per MD due to hematuria) Monitor platelets by anticoagulation protocol: Yes   Pan:  Continue heparin infusion at 450 units/hr     Daily CBC and heparin level  Follow up plans for transition back to apixaban- continue to hold per surgery since no reversal agent.   Closely f/u blood in colostomy and foley    Royetta Asal, PharmD, BCPS Pager [phone] 05/03/2017 10:51 AM

## 2017-05-04 LAB — CBC
HCT: 26.6 % — ABNORMAL LOW (ref 36.0–46.0)
Hemoglobin: 8.8 g/dL — ABNORMAL LOW (ref 12.0–15.0)
MCH: 26.3 pg (ref 26.0–34.0)
MCHC: 33.1 g/dL (ref 30.0–36.0)
MCV: 79.4 fL (ref 78.0–100.0)
PLATELETS: 395 10*3/uL (ref 150–400)
RBC: 3.35 MIL/uL — ABNORMAL LOW (ref 3.87–5.11)
RDW: 17.7 % — AB (ref 11.5–15.5)
WBC: 13 10*3/uL — ABNORMAL HIGH (ref 4.0–10.5)

## 2017-05-04 LAB — BASIC METABOLIC PANEL
Anion gap: 7 (ref 5–15)
BUN: 17 mg/dL (ref 6–20)
CALCIUM: 7.9 mg/dL — AB (ref 8.9–10.3)
CO2: 23 mmol/L (ref 22–32)
Chloride: 102 mmol/L (ref 101–111)
Creatinine, Ser: 1.04 mg/dL — ABNORMAL HIGH (ref 0.44–1.00)
GFR calc Af Amer: 55 mL/min — ABNORMAL LOW (ref >60)
GFR, EST NON AFRICAN AMERICAN: 47 mL/min — AB (ref >60)
GLUCOSE: 74 mg/dL (ref 65–99)
Potassium: 3.5 mmol/L (ref 3.5–5.1)
Sodium: 132 mmol/L — ABNORMAL LOW (ref 135–145)

## 2017-05-04 LAB — HEPARIN LEVEL (UNFRACTIONATED): HEPARIN UNFRACTIONATED: 0.56 [IU]/mL (ref 0.30–0.70)

## 2017-05-04 MED ORDER — PROMETHAZINE HCL 25 MG/ML IJ SOLN: 12.5000 mg | Freq: Four times a day (QID) | INTRAMUSCULAR | Status: DC | PRN

## 2017-05-04 NOTE — Progress Notes (Signed)
6 Days Post-Op    CC:  Subjective: Pt seen by Palliative and is discussing Palliative vs residential Hospice. No stool in the ostomy. She has some fluid but no stools at this point.    Objective: Vital signs in last 24 hours: Temp:  [98 F (36.7 C)-98.2 F (36.8 C)] 98.2 F (36.8 C) (07/31 0407) Pulse Rate:  [63-76] 64 (07/31 0407) Resp:  [18-19] 18 (07/31 0407) BP: (153-181)/(56-69) 156/56 (07/31 0531) SpO2:  [95 %-99 %] 95 % (07/31 0407) Last BM Date: 05/01/17 460 PO 1015 IV 475 urine 145 stool Afebrile, VSS Na 132 CMP stable WBC is down 13K H/H down some Film yesterday:  1. Unchanged bowel gas pattern compared to 2 days prior. 2. No interval progression of oral contrast beyond the sigmoid colon where there is postoperative changes and tumor by preceding CT. 3. Despite distal stenosis, the left colon is comparatively decompressed and the cecum is preferentially distended to 14 cm diameter. Intake/Output from previous day: 07/30 0701 - 07/31 0700 In: 1511.5 [P.O.:460; I.V.:1051.5] Out: 620 [Urine:475; Stool:145] Intake/Output this shift: No intake/output data recorded.  General appearance: alert, cooperative and no distress Resp: clear to auscultation bilaterally and on O2 GI: soft, still a little tender. No stool in ostomy, just some fluid.   Lab Results:   Recent Labs  05/03/17 0456 05/04/17 0413  WBC 16.9* 13.0*  HGB 9.4* 8.8*  HCT 28.2* 26.6*  PLT 382 395    BMET  Recent Labs  05/03/17 0456 05/04/17 0413  NA 131* 132*  K 3.6 3.5  CL 102 102  CO2 21* 23  GLUCOSE 93 74  BUN 15 17  CREATININE 1.00 1.04*  CALCIUM 7.9* 7.9*   PT/INR No results for input(s): LABPROT, INR in the last 72 hours.  No results for input(s): AST, ALT, ALKPHOS, BILITOT, PROT, ALBUMIN in the last 168 hours.   Lipase     Component Value Date/Time   LIPASE 24 04/25/2017 1504     Medications: . acetaminophen  325 mg Oral Q6H  . feeding supplement  1 Container  Oral BID BM  . hydrocortisone   Rectal QID  . multivitamin-lutein  1 capsule Oral Daily  . pantoprazole (PROTONIX) IV  40 mg Intravenous Q12H  . senna-docusate  2 tablet Oral Daily  . sodium phosphate  1 enema Rectal Once   . sodium chloride 75 mL/hr at 05/04/17 0339  . heparin 450 Units/hr (05/02/17 1950)  . methocarbamol (ROBAXIN)  IV Stopped (05/01/17 1049)   Anti-infectives    Start     Dose/Rate Route Frequency Ordered Stop   04/28/17 1334  cefoTEtan in Dextrose 5% (CEFOTAN) 2-2.08 GM-% IVPB    Comments:  Tamera Punt, [place]   : cabinet override      04/28/17 1334 04/28/17 1405   04/28/17 1215  cefoTEtan in Dextrose 5% (CEFOTAN) IVPB 2 g     2 g Intravenous On call to O.R. 04/27/17 1255 04/28/17 1420   04/28/17 0600  cefoTEtan (CEFOTAN) 2 g in dextrose 5 % 50 mL IVPB  Status:  Discontinued     2 g 100 mL/hr over 30 Minutes Intravenous On call to O.R. 04/27/17 1233 04/27/17 1255   04/25/17 2100  cefTRIAXone (ROCEPHIN) 1 g in dextrose 5 % 50 mL IVPB  Status:  Discontinued     1 g 100 mL/hr over 30 Minutes Intravenous Every 24 hours 04/25/17 2052 04/27/17 1220     Assessment/Plan Metastatic colon cancer to liver/retroperitoneal  Adenopathy/left ureter obstuction  Large bowel obstruction - s/p open sigmoidectomy with repair of colovesicular fistula 01/14/17 by Dr. Marcello Moores - 1 week of progressive lower abdominal pain, n/v, anorexia, constipation - CT scan showed large bowel obstruction due to recurrent tumor at sigmoid anastomosis with large amount of fecal matter proximal to this region, left hydronephrosis, and likely small metastatic liver lesions - not a candidate for stent placement LAPAROSCOPIC ASSISTED DIVERTING  LOOP COLOSTOMY CYSTOSCOPY WITH LEFT RETROGRADE PYELOGRAM/ [place], 04/28/17, Dr. Lynnette Caffey  POD 6 Renal insufficiency/Left hydronephrosis - per urology Anemia - Hg 10.2, stable DVT - on eliquis at home  ID - rocephin 7/22>>7/24.  Cefotetan perioperative VTE - heparin FEN - IVF, clear liquids, Boost breeze   Plan:   Continue to try and make her comfortable, leave on liquids for now.  LOS: 8 days    Kaprice Kage 05/04/2017 [id number]

## 2017-05-04 NOTE — NC FL2 (Signed)
[place] LEVEL OF CARE SCREENING TOOL     IDENTIFICATION  Patient Name: Charlene [name] Birthdate: [birth year] Sex: female Admission Date (Current Location): 04/25/2017  [facility] and [place] Number:  Herbalist and Address:  [facility],  [place] [address], [place]      Provider Number: [id number]  Attending Physician Name and Address:  Loistine Chance, MD  Relative Name and Phone Number:       Current Level of Care: Hospital Recommended Level of Care: [place] Prior Approval Number:    Date Approved/Denied:   PASRR Number: [id number]  Discharge Plan: SNF    Current Diagnoses: Patient Active Problem List   Diagnosis Date Noted  . Atelectasis   . Gross hematuria 04/30/2017  . Pyuria   . Deep vein thrombosis (DVT) of lower extremity ([place])   . Malignant neoplasm of sigmoid colon ([place])   . Hydronephrosis   . Encounter for palliative care   . Goals of care, counseling/discussion   . Large bowel obstruction ([place]) 04/26/2017  . Abdominal pain 04/25/2017  . UTI (urinary tract infection) 04/25/2017  . Renal insufficiency 04/25/2017  . Anemia 04/25/2017  . Hypertension 04/25/2017  . Pelvic abscess in female 02/02/2017  . Colovesical fistula with cancer s/p colectomy/repair 01/14/2017 01/18/2017  . 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 Protein-calorie malnutrition, severe 02/01/2016  . Depression 01/31/2016  . GERD (gastroesophageal reflux disease) 01/31/2016  . Hemorrhoids 01/31/2016  . Acute diverticulitis 05/05/2015  . Abnormal LFTs 05/05/2015  . Dyslipidemia 03/13/2015  . Leukocytosis 03/13/2015  . Acute renal failure ([place]) 03/13/2015    Orientation RESPIRATION BLADDER Height & Weight     Self, Time, Situation, Place  O2 ([place] 1L/min) Indwelling catheter, Continent Weight: 129 lb (58.5 kg) Height:  5\' 4"  (162.6 cm)  BEHAVIORAL SYMPTOMS/MOOD NEUROLOGICAL BOWEL  NUTRITION STATUS      Colostomy (Ostomy pouching: 2pc. 2 3/4" pouch with barrier rings in place) Diet (clear liquid)  AMBULATORY STATUS COMMUNICATION OF NEEDS Skin   Extensive Assist Verbally Other (Comment) ( Incision(Closed)07/25/18AbdomenOther)                       Personal Care Assistance Level of Assistance  Bathing, Feeding, Dressing Bathing Assistance: Maximum assistance Feeding assistance: Limited assistance Dressing Assistance: Maximum assistance     Functional Limitations Info  Sight, Hearing, Speech Sight Info: Adequate Hearing Info: Adequate Speech Info: Adequate    SPECIAL CARE FACTORS FREQUENCY  PT (By licensed PT), OT (By licensed OT)     PT Frequency: 5x OT Frequency: 5x            Contractures Contractures Info: Not present    Additional Factors Info  Code Status, Allergies Code Status Info: DNR Allergies Info: NKA           Current Medications (05/04/2017):  This is the current hospital active medication list Current Facility-Administered Medications  Medication Dose Route Frequency Provider Last Rate Last Dose  . 0.9 %  sodium chloride infusion   Intravenous Continuous Regalado, Belkys A, MD 75 mL/hr at 05/04/17 0339    . acetaminophen (TYLENOL) tablet 325 mg  325 mg Oral Q6H Focht, Jessica L, PA   325 mg at 05/04/17 0533  . docusate sodium (COLACE) capsule 100 mg  100 mg Oral Daily PRN Jani Gravel, MD   100 mg at 04/25/17 2233  . feeding supplement (BOOST / RESOURCE BREEZE) liquid 1  Container  1 Container Oral BID BM Meuth, Brooke A, PA-C   1 Container at 05/02/17 0801  . heparin ADULT infusion 100 units/mL (25000 units/235mL sodium chloride 0.45%)  450 Units/hr Intravenous Continuous Thomes Lolling, RPH 4.5 mL/hr at 05/02/17 1950 450 Units/hr at 05/02/17 1950  . hydrALAZINE (APRESOLINE) injection 10 mg  10 mg Intravenous Q4H PRN Rise Patience, MD   10 mg at 05/04/17 0439  . hydrocortisone (ANUSOL-HC) 2.5 % rectal cream    Rectal QID Dessa Phi Chahn-Yang, DO      . HYDROmorphone (DILAUDID) injection 0.5 mg  0.5 mg Intravenous Q3H PRN Loistine Chance, MD      . hydroxypropyl methylcellulose / hypromellose (ISOPTO TEARS / GONIOVISC) 2.5 % ophthalmic solution 1 drop  1 drop Both Eyes Daily PRN Jani Gravel, MD      . methocarbamol (ROBAXIN) 500 mg in dextrose 5 % 50 mL IVPB  500 mg Intravenous Q8H PRN Meuth, Brooke A, PA-C   Stopped at 05/01/17 1049  . multivitamin-lutein (OCUVITE-LUTEIN) capsule 1 capsule  1 capsule Oral Daily Jani Gravel, MD   1 capsule at 05/04/17 1051  . ondansetron (ZOFRAN) injection 4 mg  4 mg Intravenous Q6H PRN Jani Gravel, MD   4 mg at 05/03/17 1013  . ondansetron (ZOFRAN) tablet 4 mg  4 mg Oral Q6H PRN Jani Gravel, MD      . opium-belladonna (B&O SUPPRETTES) 16.2-60 MG suppository 1 suppository  1 suppository Rectal Q8H PRN Benito Mccreedy, MD   1 suppository at 05/01/17 1405  . oxyCODONE (Oxy IR/ROXICODONE) immediate release tablet 5-10 mg  5-10 mg Oral Q4H PRN Focht, Jessica L, PA   5 mg at 05/04/17 1058  . pantoprazole (PROTONIX) injection 40 mg  40 mg Intravenous Q12H Regalado, Belkys A, MD   40 mg at 05/04/17 1051  . polyethylene glycol (MIRALAX / GLYCOLAX) packet 17 g  17 g Oral Daily PRN Jani Gravel, MD      . promethazine (PHENERGAN) injection 12.5 mg  12.5 mg Intravenous Q6H PRN Loistine Chance, MD      . senna-docusate (Senokot-S) tablet 2 tablet  2 tablet Oral Daily Jani Gravel, MD   2 tablet at 05/04/17 1051  . simethicone (MYLICON) chewable tablet 80 mg  80 mg Oral Q6H PRN Greer Pickerel, MD      . sodium phosphate (FLEET) 7-19 GM/118ML enema 1 enema  1 enema Rectal Daily PRN Jani Gravel, MD      . sodium phosphate (FLEET) 7-19 GM/118ML enema 1 enema  1 enema Rectal Once Benito Mccreedy, MD         Discharge Medications: Please see discharge summary for a list of discharge medications.  Relevant Imaging Results:  Relevant Lab Results:   Additional Information ssn:[id number]    Will need pallaitve care at the facility.  Burnis Medin, LCSW

## 2017-05-04 NOTE — Consult Note (Signed)
[place] Nurse ostomy follow up Stoma type/location: LUQ loop colostomy.  Pouch was leaking and was changed yesterday.  Pouch is intact today with only blood tinged liquid in the pouch at this time.  Treatment options for stomal/peristomal skin: Barrier rings used to promote seal due to retention rod.  Output bloody liquid Ostomy pouching: 2pc. 2 3/4" pouch with barrier rings in place from yesterday.   Education provided: Son is at bedside this AM.  Explained that there was no order to remove the rod at this time and the [place] team will continue to watch for the order.  Son and patient deny further questions.  Enrolled patient in [place] Start Discharge program: Yes [place] team will follow.  Domenic Moras RN BSN [place] Pager [phone]

## 2017-05-04 NOTE — Progress Notes (Signed)
CSW spoke with patient at bedside and confirmed patient's plan to discharge to [facility] SNF with palliative when medically stable. CSW updated [facility] SNF, staff agreed to contact patient's son about bed availability options. CSW will continue to follow and assist patient with discharge planning to SNF.   Abundio Miu, [place] Social Worker [facility] Cell#: [phone]

## 2017-05-04 NOTE — Progress Notes (Signed)
ANTICOAGULATION CONSULT NOTE    Pharmacy Consult for heparin Indication: DVT - bridge while off apixaban for surgery  No Known Allergies  Patient Measurements: Height: 5\' 4"  (162.6 cm) Weight: 129 lb (58.5 kg) IBW/kg (Calculated) : 54.7 Heparin Dosing Weight: 59kg  Vital Signs: Temp: 98.2 F (36.8 C) (07/31 0407) Temp Source: Oral (07/31 0407) BP: 156/56 (07/31 0531) Pulse Rate: 64 (07/31 0407)  Labs:  Recent Labs  05/01/17 1545  05/02/17 0121  05/02/17 1812 05/03/17 0456 05/04/17 0413  HGB  --   < > 9.5*  --   --  9.4* 8.8*  HCT  --   --  28.0*  --   --  28.2* 26.6*  PLT  --   --  325  --   --  382 395  APTT 28  --  41*  --   --   --   --   HEPARINUNFRC 0.61  --  0.73*  < > 0.64 0.57 0.56  CREATININE  --   --  0.99  --   --  1.00 1.04*  < > = values in this interval not displayed.  Estimated Creatinine Clearance: 33.5 mL/min (A) (by C-G formula based on SCr of 1.04 mg/dL (H)).   Assessment: 89 YOF with a large bowel obstruction with a history of metastatic colon cancer.  She has a history of DVT on apixaban 2.5mg  BID (dose reduced due to bleeding, advanced age & decreased renal function).  Apixaban on hold since patient going to OR 7/25 and bridging with IV heparin.   Last dose of apixaban was 7/22 at 0930  Baseline aPTT = 41 sec,  HL = 1.46 (falsely elevated due to effects of apixaban),  INR = 1.37  05/04/2017:  Heparin level 0.56 (therapeutic)  Hg low, but stable.  Pltc WNL  Hematuria stable per RN   Goal of Therapy:  Heparin level 0.3-0.7 (target low end of goal range per MD due to hematuria) Monitor platelets by anticoagulation protocol: Yes   Pan:  Continue heparin infusion at 450 units/hr     Daily CBC and heparin level  Follow up plans for transition back to apixaban - continue to hold another day or so per surgery 7/30 note since no reversal agent.   Closely f/u blood in colostomy and foley   Adrian Saran, PharmD, BCPS Pager  [phone] 05/04/2017 7:26 AM

## 2017-05-04 NOTE — Progress Notes (Signed)
PROGRESS NOTE    Charlene [name]  MWN:[id number] DOB: Jun 08, 1928 DOA: 04/25/2017 PCP: Shon Baton, MD    Brief Narrative:  Charlene [name] is a 83 yo female with PMHx of metastatic colon cancer to liver who presents with lower abdominal pain, lack of bowel movement in > 1 week, nausea. CT abdomen and pelvis was completed emergency department, which showed small liver metastases, persistent left hydronephrosis due to ureteral obstruction in the left pelvic retroperitoneum, large bowel obstruction with a large amount of fecal matter proximal to this region. Urology, general surgery, gastroenterology consulted. Patient underwent diverting loop colostomy on 04/28/2017.     Assessment & Plan:   Principal Problem:   Abdominal pain Active Problems:   UTI (urinary tract infection)   Renal insufficiency   Anemia   Hypertension   Large bowel obstruction (HCC)   Malignant neoplasm of sigmoid colon (HCC)   Hydronephrosis   Encounter for palliative care   Goals of care, counseling/discussion   Pyuria   Deep vein thrombosis (DVT) of lower extremity (HCC)   Gross hematuria   Atelectasis  Metastatic colon cancer to the liver /Large bowel obstruction  GI and general surgery has been consulting.  Per GI, colonic stent is not feasible due to tumor length and tortuosity of colon.  Status post open sigmoidectomy with repair of colovesicular fistula 01/14/2017 per Dr. Marcello Moores.  General surgery/GI recommended diverting colostomy which was done 04/28/2017.. S/p Enema and digital disimpaction with no significant results  KUB results noted, unchanged bowel gas pattern, appreciate surgery following.     Acute kidney injury secondary to obstruction, with left hydronephrosis and complete left urethral obstruction Baseline Cr 0.63. Urology consulted, no emergent indication for intervention at this point.  Patient s/p cystoscopy with retrograde pyelogram/ per urology 04/28/2017 which noted a complete left  urethral obstruction and inability to cannulate left urethra and passed Y have beyond the UVJ. 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  Follow renal function with electrolytes and avoid nephrotoxins. Cr stable currently. Monitor I/O  Outpatient follow-up with urology vs need for percutaneous nephrostomy drains to be placed as inpatient. Will request urology and oncology input.   Hyponatremia: continue NS IVF   Pyuria Urine culture with contamination. IV Rocephin discontinued.   Metastatic colon cancer to the liver s/p open sigmoidectomy with repair of colovesicular fistula  Patient follows with Dr. Benay Spice Palliative care also following   Anemia of chronic disease Hgb stable, monitor   DVT Held eliquis pending surgical intervention. Started heparin gtt, which was held 6 hours prior to surgery.  Would continue with heparin Gtt for now, hematuria has decreased, no further blood from colostomy.    Gross hematuria  Urology following. Hb stable. Continue to monitor.   Leukocytosis  WBC count improved to 13.  WBC   trending down.  Urine culture from 7-27; no growth to date, blood culture; no growth to date,   DVT prophylaxis: SCD/heparin drip: pharmacy following for reinstating her NOAC post op. Appreciate their input Code Status: DO NOT RESUSCITATE Family Communication: patient and son.  Disposition Plan: SNF rehab with palliative versus residential hospice, continue discussions with patient and family, along with palliative care. Long discussions with patient and son at the bedside this am.   Consultants:   Palliative care Dr. Domingo Cocking 04/25/2017  Urology Dr. Alyson Ingles 04/25/2017  Gen. surgery Dr. Excell Seltzer 04/26/2017  Gastroenterology Dr.Danis III 04/26/2017  Wound care  Oncology Dr. Benay Spice 04/27/2017  Procedures:   CT abdomen and pelvis 04/25/2017  Laparoscopic-assisted diverting loop colostomy per Dr. Ninfa Linden general  surgery 04/28/2017  Cystourethroscopy, left retrograde pyelogram, intraoperative fluoroscopy with interpretation per Dr. Alinda Money 04/28/2017  Antimicrobials:   IV Rocephin 04/25/2017>>>> 04/27/2017   Subjective: Awake alert resting in bed Son at bedside Discussed with patient and son about her current hospital course  Objective: Vitals:   05/03/17 1609 05/03/17 2049 05/04/17 0407 05/04/17 0531  BP: (!) 173/69 (!) 153/64 (!) 181/65 (!) 156/56  Pulse: 63 76 64   Resp: 19 18 18    Temp: 98 F (36.7 C) 98.2 F (36.8 C) 98.2 F (36.8 C)   TempSrc: Oral Oral Oral   SpO2: 99% 99% 95%   Weight:      Height:        Intake/Output Summary (Last 24 hours) at 05/04/17 0957 Last data filed at 05/04/17 0600  Gross per 24 hour  Intake           1511.5 ml  Output              620 ml  Net            891.5 ml   Filed Weights   04/26/17 0550 04/27/17 0610 04/28/17 1321  Weight: 57.8 kg (127 lb 6.8 oz) 58.6 kg (129 lb 1.6 oz) 58.5 kg (129 lb)    Examination: Awake alert oriented In better spirits this am  General exam: NAD Respiratory system: CTA, Respiratory effort normal.  Cardiovascular system: S 1, S 2 RRR Gastrointestinal system: abdomen mildly distended, tender to palpation.  Ostomy intact, no blood in pouch  Central nervous system: Alert, non focal.  Extremities: Symmetric 5 x 5 power. Skin: no rash or lesion. Genitourinary: Foley catheter in place. Urine dark    Data Reviewed: I have personally reviewed following labs and imaging studies  CBC:  Recent Labs Lab 04/29/17 0456 04/30/17 0435 05/01/17 0502 05/02/17 0121 05/03/17 0456 05/04/17 0413  WBC 11.8* 21.0* 18.5* 18.8* 16.9* 13.0*  NEUTROABS 11.1*  --   --   --   --   --   HGB 10.2* 10.2* 9.0* 9.5* 9.4* 8.8*  HCT 31.8* 31.1* 26.7* 28.0* 28.2* 26.6*  MCV 81.3 79.5 79.2 79.3 79.9 79.4  PLT 367 421* 356 325 382 026   Basic Metabolic Panel:  Recent Labs Lab 04/28/17 0833 04/29/17 0456 04/30/17 0435  05/01/17 0502 05/02/17 0121 05/03/17 0456 05/04/17 0413  NA  --  133* 133* 133* 132* 131* 132*  K  --  4.7 3.6 3.4* 3.5 3.6 3.5  CL  --  106 107 103 103 102 102  CO2  --  16* 19* 22 20* 21* 23  GLUCOSE  --  114* 132* 114* 86 93 74  BUN  --  17 19 15 14 15 17   CREATININE  --  1.20* 1.07* 1.14* 0.99 1.00 1.04*  CALCIUM  --  8.0* 7.8* 7.9* 7.9* 7.9* 7.9*  MG 1.8 1.7  --   --   --   --   --    GFR: Estimated Creatinine Clearance: 33.5 mL/min (A) (by C-G formula based on SCr of 1.04 mg/dL (H)). Liver Function Tests: No results for input(s): AST, ALT, ALKPHOS, BILITOT, PROT, ALBUMIN in the last 168 hours. No results for input(s): LIPASE, AMYLASE in the last 168 hours. No results for input(s): AMMONIA in the last 168 hours. Coagulation Profile: No results for input(s): INR, PROTIME in the last 168 hours. Cardiac Enzymes: No  results for input(s): CKTOTAL, CKMB, CKMBINDEX, TROPONINI in the last 168 hours. BNP (last 3 results) No results for input(s): PROBNP in the last 8760 hours. HbA1C: No results for input(s): HGBA1C in the last 72 hours. CBG: No results for input(s): GLUCAP in the last 168 hours. Lipid Profile: No results for input(s): CHOL, HDL, LDLCALC, TRIG, CHOLHDL, LDLDIRECT in the last 72 hours. Thyroid Function Tests: No results for input(s): TSH, T4TOTAL, FREET4, T3FREE, THYROIDAB in the last 72 hours. Anemia Panel: No results for input(s): VITAMINB12, FOLATE, FERRITIN, TIBC, IRON, RETICCTPCT in the last 72 hours. Sepsis Labs: No results for input(s): PROCALCITON, LATICACIDVEN in the last 168 hours.  Recent Results (from the past 240 hour(s))  Urine culture     Status: Abnormal   Collection Time: 04/25/17 10:46 PM  Result Value Ref Range Status   Specimen Description URINE, CLEAN CATCH  Final   Special Requests NONE  Final   Culture MULTIPLE SPECIES PRESENT, SUGGEST RECOLLECTION (A)  Final   Report Status 04/27/2017 FINAL  Final  Surgical pcr screen     Status: None     Collection Time: 04/27/17  9:18 PM  Result Value Ref Range Status   MRSA, PCR NEGATIVE NEGATIVE Final   Staphylococcus aureus NEGATIVE NEGATIVE Final    Comment:        The Xpert SA Assay (FDA approved for NASAL specimens in patients over 15 years of age), is one component of a comprehensive surveillance program.  Test performance has been validated by [facility] for patients greater than or equal to 17 year old. It is not intended to diagnose infection nor to guide or monitor treatment.   Culture, blood (Routine X 2) w Reflex to ID Panel     Status: None (Preliminary result)   Collection Time: 04/30/17 10:02 AM  Result Value Ref Range Status   Specimen Description BLOOD LEFT HAND  Final   Special Requests AEROBIC BOTTLE ONLY Blood Culture adequate volume  Final   Culture   Final    NO GROWTH 3 DAYS Performed at [facility], [place] [address] [place], [place] [address]    Report Status PENDING  Incomplete  Culture, blood (Routine X 2) w Reflex to ID Panel     Status: None (Preliminary result)   Collection Time: 04/30/17 10:02 AM  Result Value Ref Range Status   Specimen Description BLOOD LEFT HAND  Final   Special Requests AEROBIC BOTTLE ONLY Blood Culture adequate volume  Final   Culture   Final    NO GROWTH 3 DAYS Performed at [facility], [place] [address] [place], [place] [address]    Report Status PENDING  Incomplete  Culture, Urine     Status: Abnormal   Collection Time: 04/30/17 12:14 PM  Result Value Ref Range Status   Specimen Description URINE, CATHETERIZED  Final   Special Requests NONE  Final   Culture (A)  Final    <10,000 COLONIES/mL INSIGNIFICANT GROWTH Performed at [facility] [address], [place], [place] [address]    Report Status 05/01/2017 FINAL  Final         Radiology Studies: Dg Abd 1 View  Result Date: 05/03/2017 CLINICAL DATA:  Severe abdominal pain.  Colon cancer. EXAM: ABDOMEN - 1 VIEW COMPARISON:  Two days  prior FINDINGS: There is unchanged bowel gas pattern. Cecum is preferentially distended, measuring up to 14 cm. There is gradual narrowing of the colon to the level of the sigmoid,  beyond which there is no visible oral contrast. Oral contrast has not progressed since prior. No noted small bowel distention. Retroperitoneal clips in the pelvis. No supine evidence of pneumoperitoneum. Well visualized wall of the right colon is attributed to intraluminal contrast. IMPRESSION: 1. Unchanged bowel gas pattern compared to 2 days prior. 2. No interval progression of oral contrast beyond the sigmoid colon where there is postoperative changes and tumor by preceding CT. 3. Despite distal stenosis, the left colon is comparatively decompressed and the cecum is preferentially distended to 14 cm diameter. Electronically Signed   By: Monte Fantasia M.D.   On: 05/03/2017 13:47        Scheduled Meds: . acetaminophen  325 mg Oral Q6H  . feeding supplement  1 Container Oral BID BM  . hydrocortisone   Rectal QID  . multivitamin-lutein  1 capsule Oral Daily  . pantoprazole (PROTONIX) IV  40 mg Intravenous Q12H  . senna-docusate  2 tablet Oral Daily  . sodium phosphate  1 enema Rectal Once   Continuous Infusions: . sodium chloride 75 mL/hr at 05/04/17 0339  . heparin 450 Units/hr (05/02/17 1950)  . methocarbamol (ROBAXIN)  IV Stopped (05/01/17 1049)     LOS: 8 days    Time spent: 35 mins    Loistine Chance, MD [place] Pager [phone]  If 7PM-7AM, please contact night-coverage [email or web address] Password [facility] 05/04/2017, 9:57 AM

## 2017-05-04 NOTE — Consult Note (Signed)
   [facility] Inpatient Consult   05/04/2017  VEDHA TERCERO Aug 29, 1928 [id number]    Screened for [facility] Care Management program.   Chart reviewed. Noted current discharge plan is for SNF with palliative follow up.   Spoke with inpatient LCSW to confirm.   No identifiable [facility] Care Management needs at this time.    Marthenia Rolling, MSN-Ed, RN,BSN [facility] Liaison [phone]

## 2017-05-04 NOTE — Plan of Care (Signed)
Problem: Nutrition: Goal: Adequate nutrition will be maintained Outcome: Completed/Met Date Met: 05/04/17 Pt has been tolerating Po's well. N/v denied

## 2017-05-05 ENCOUNTER — Inpatient Hospital Stay (HOSPITAL_COMMUNITY): Payer: Medicare Other

## 2017-05-05 DIAGNOSIS — C189 Malignant neoplasm of colon, unspecified: Secondary | ICD-10-CM

## 2017-05-05 LAB — CULTURE, BLOOD (ROUTINE X 2)
Culture: NO GROWTH
Culture: NO GROWTH
SPECIAL REQUESTS: ADEQUATE
Special Requests: ADEQUATE

## 2017-05-05 LAB — CBC
HCT: 26.6 % — ABNORMAL LOW (ref 36.0–46.0)
Hemoglobin: 9 g/dL — ABNORMAL LOW (ref 12.0–15.0)
MCH: 26.8 pg (ref 26.0–34.0)
MCHC: 33.8 g/dL (ref 30.0–36.0)
MCV: 79.2 fL (ref 78.0–100.0)
PLATELETS: 410 10*3/uL — AB (ref 150–400)
RBC: 3.36 MIL/uL — ABNORMAL LOW (ref 3.87–5.11)
RDW: 17.6 % — AB (ref 11.5–15.5)
WBC: 12.4 10*3/uL — ABNORMAL HIGH (ref 4.0–10.5)

## 2017-05-05 LAB — HEPARIN LEVEL (UNFRACTIONATED): Heparin Unfractionated: 0.49 IU/mL (ref 0.30–0.70)

## 2017-05-05 MED ORDER — KCL IN DEXTROSE-NACL 40-5-0.9 MEQ/L-%-% IV SOLN
INTRAVENOUS | Status: DC
  Administered 2017-05-05 – 2017-05-08 (×6): via INTRAVENOUS
  Filled 2017-05-05 (×6): qty 1000

## 2017-05-05 MED ORDER — PROSIGHT PO TABS
1.0000 | ORAL_TABLET | Freq: Every day | ORAL | Status: DC
  Administered 2017-05-05 – 2017-05-08 (×4): 1 via ORAL
  Filled 2017-05-05 (×5): qty 1

## 2017-05-05 NOTE — Plan of Care (Signed)
Problem: Pain Managment: Goal: General experience of comfort will improve Outcome: Completed/Met Date Met: 05/05/17 Pain medication appears to be working. Pain goal met per pt

## 2017-05-05 NOTE — Progress Notes (Signed)
PROGRESS NOTE    Charlene [name]  DGL:[id number] DOB: 1928-08-29 DOA: 04/25/2017 PCP: Shon Baton, MD   Brief Narrative:  83 year old female with past medical history of metastatic colon cancer to liver and to the hospital with complaints of lower abdominal pain, constipation and nausea. CT of the abdomen pelvis showed liver metastases with persistent left-sided hydro-due to ureteral obstruction in the left helical retroperitoneal and large bowel obstruction likely secondary to large amount of fecal matter.   Assessment & Plan:   Principal Problem:   Abdominal pain Active Problems:   UTI (urinary tract infection)   Renal insufficiency   Anemia   Hypertension   Large bowel obstruction (HCC)   Malignant neoplasm of sigmoid colon (HCC)   Hydronephrosis   Encounter for palliative care   Goals of care, counseling/discussion   Pyuria   Deep vein thrombosis (DVT) of lower extremity (HCC)   Gross hematuria   Atelectasis  Large bowel obstruction Metastatic colon cancer to the liver -This obstruction is most likely caused by fecal matter worsens malignant mass -Gastroenterology and general surgery is following -Plans on giving her an enema today for likely constipation. Will follow -Dr Learta Codding is patients Onc. Palliative care consulted. Has follow up appointment soon.   Acute kidney injury with left-sided hydronephrosis obstruction -Creatinine slightly elevated compared to yesterday 1.04 -No urgent intervention per urology. Patient is status post cystoscopy on 04/28/2017 which showed complete left-sided urethral obstruction therefore unable to cannulate. 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 Meanwhile continue heparin drip  Anemia of chronic disease -No signs of active blood loss. Patient is on heparin drip therefore will  closely monitor -Hemoglobin is 9 which is baseline.  DVT prophylaxis: Heparin drip Code Status: DO NOT RESUSCITATE Family Communication:  None at bedside. Patient comprehends well Disposition Plan: To be determined  Consultants:   Urology  General surgery  Palliative care  Gastroenterology  Wound care  Procedures:   Dr Ninfa Linden- Laparoscopy assisted diverting loop colostomy on July 20 15,018  Dr Alinda Money- Cystourethroscopy on July 20 15,018  Antimicrobials:   Rocephin 7/22-7/24   Subjective: No complaints this morning.   Objective: Vitals:   05/04/17 0531 05/04/17 1414 05/04/17 2133 05/05/17 0501  BP: (!) 156/56 (!) 167/60 (!) 168/57 (!) 162/68  Pulse:  60 (!) 55 (!) 59  Resp:  19 18 16   Temp:  97.9 F (36.6 C) 97.7 F (36.5 C) 97.9 F (36.6 C)  TempSrc:  Oral Oral Oral  SpO2:  96% 96% 94%  Weight:      Height:        Intake/Output Summary (Last 24 hours) at 05/05/17 1406 Last data filed at 05/05/17 0956  Gross per 24 hour  Intake             1434 ml  Output              300 ml  Net             1134 ml   Filed Weights   04/26/17 0550 04/27/17 0610 04/28/17 1321  Weight: 57.8 kg (127 lb 6.8 oz) 58.6 kg (129 lb 1.6 oz) 58.5 kg (129 lb)    Examination:  General exam: Appears calm and comfortable  Respiratory system: Clear to auscultation. Respiratory effort normal. Cardiovascular system: S1 & S2 heard, RRR. No JVD, murmurs, rubs,  gallops or clicks. No pedal edema. Gastrointestinal system: Ostomy bag in place Abdomen is nondistended, soft and nontender. No organomegaly or masses felt. Normal bowel sounds heard. Central nervous system: Alert and oriented. No focal neurological deficits. Extremities: Symmetric 5 x 5 power. Skin: No rashes, lesions or ulcers Psychiatry: Judgement and insight appear normal. Mood & affect appropriate.     Data Reviewed:   CBC:  Recent Labs Lab 04/29/17 0456  05/01/17 0502 05/02/17 0121 05/03/17 0456  05/04/17 0413 05/05/17 0516  WBC 11.8*  < > 18.5* 18.8* 16.9* 13.0* 12.4*  NEUTROABS 11.1*  --   --   --   --   --   --   HGB 10.2*  < > 9.0* 9.5* 9.4* 8.8* 9.0*  HCT 31.8*  < > 26.7* 28.0* 28.2* 26.6* 26.6*  MCV 81.3  < > 79.2 79.3 79.9 79.4 79.2  PLT 367  < > 356 325 382 395 410*  < > = values in this interval not displayed. Basic Metabolic Panel:  Recent Labs Lab 04/29/17 0456 04/30/17 0435 05/01/17 0502 05/02/17 0121 05/03/17 0456 05/04/17 0413  NA 133* 133* 133* 132* 131* 132*  K 4.7 3.6 3.4* 3.5 3.6 3.5  CL 106 107 103 103 102 102  CO2 16* 19* 22 20* 21* 23  GLUCOSE 114* 132* 114* 86 93 74  BUN 17 19 15 14 15 17   CREATININE 1.20* 1.07* 1.14* 0.99 1.00 1.04*  CALCIUM 8.0* 7.8* 7.9* 7.9* 7.9* 7.9*  MG 1.7  --   --   --   --   --    GFR: Estimated Creatinine Clearance: 33.5 mL/min (A) (by C-G formula based on SCr of 1.04 mg/dL (H)). Liver Function Tests: No results for input(s): AST, ALT, ALKPHOS, BILITOT, PROT, ALBUMIN in the last 168 hours. No results for input(s): LIPASE, AMYLASE in the last 168 hours. No results for input(s): AMMONIA in the last 168 hours. Coagulation Profile: No results for input(s): INR, PROTIME in the last 168 hours. Cardiac Enzymes: No results for input(s): CKTOTAL, CKMB, CKMBINDEX, TROPONINI in the last 168 hours. BNP (last 3 results) No results for input(s): PROBNP in the last 8760 hours. HbA1C: No results for input(s): HGBA1C in the last 72 hours. CBG: No results for input(s): GLUCAP in the last 168 hours. Lipid Profile: No results for input(s): CHOL, HDL, LDLCALC, TRIG, CHOLHDL, LDLDIRECT in the last 72 hours. Thyroid Function Tests: No results for input(s): TSH, T4TOTAL, FREET4, T3FREE, THYROIDAB in the last 72 hours. Anemia Panel: No results for input(s): VITAMINB12, FOLATE, FERRITIN, TIBC, IRON, RETICCTPCT in the last 72 hours. Sepsis Labs: No results for input(s): PROCALCITON, LATICACIDVEN in the last 168 hours.  Recent  Results (from the past 240 hour(s))  Urine culture     Status: Abnormal   Collection Time: 04/25/17 10:46 PM  Result Value Ref Range Status   Specimen Description URINE, CLEAN CATCH  Final   Special Requests NONE  Final   Culture MULTIPLE SPECIES PRESENT, SUGGEST RECOLLECTION (A)  Final   Report Status 04/27/2017 FINAL  Final  Surgical pcr screen     Status: None   Collection Time: 04/27/17  9:18 PM  Result Value Ref Range Status   MRSA, PCR NEGATIVE NEGATIVE Final   Staphylococcus aureus NEGATIVE NEGATIVE Final    Comment:        The Xpert SA Assay (FDA approved for NASAL specimens in patients over 30 years of age), is one component of a comprehensive surveillance program.  Test  performance has been validated by [facility] for patients greater than or equal to 63 year old. It is not intended to diagnose infection nor to guide or monitor treatment.   Culture, blood (Routine X 2) w Reflex to ID Panel     Status: None   Collection Time: 04/30/17 10:02 AM  Result Value Ref Range Status   Specimen Description BLOOD LEFT HAND  Final   Special Requests AEROBIC BOTTLE ONLY Blood Culture adequate volume  Final   Culture   Final    NO GROWTH 5 DAYS Performed at [facility], [place] [address], [place], [place] [address]    Report Status 05/05/2017 FINAL  Final  Culture, blood (Routine X 2) w Reflex to ID Panel     Status: None   Collection Time: 04/30/17 10:02 AM  Result Value Ref Range Status   Specimen Description BLOOD LEFT HAND  Final   Special Requests AEROBIC BOTTLE ONLY Blood Culture adequate volume  Final   Culture   Final    NO GROWTH 5 DAYS Performed at [facility], [place] [address] [place], [place] [address]    Report Status 05/05/2017 FINAL  Final  Culture, Urine     Status: Abnormal   Collection Time: 04/30/17 12:14 PM  Result Value Ref Range Status   Specimen Description URINE, CATHETERIZED  Final   Special Requests NONE  Final   Culture (A)  Final     <10,000 COLONIES/mL INSIGNIFICANT GROWTH Performed at [facility], [place] [address], [place], [place] [address]    Report Status 05/01/2017 FINAL  Final         Radiology Studies: No results found.      Scheduled Meds: . acetaminophen  325 mg Oral Q6H  . feeding supplement  1 Container Oral BID BM  . hydrocortisone   Rectal QID  . multivitamin  1 tablet Oral Daily  . pantoprazole (PROTONIX) IV  40 mg Intravenous Q12H  . senna-docusate  2 tablet Oral Daily  . sodium phosphate  1 enema Rectal Once   Continuous Infusions: . dextrose 5 % and 0.9 % NaCl with KCl 40 mEq/L    . heparin 450 Units/hr (05/04/17 1334)  . methocarbamol (ROBAXIN)  IV Stopped (05/01/17 1049)     LOS: 9 days    Time spent: 35 mins     Ankit Arsenio Loader, MD [facility] Pager [phone]   If 7PM-7AM, please contact night-coverage [email or web address] Password [email or web address] 05/05/2017, 2:06 PM

## 2017-05-05 NOTE — Progress Notes (Signed)
OT Cancellation Note  Patient Details Name: Charlene [name] MRN: [id number] DOB: [birth year]   Cancelled Treatment:    Reason Eval/Treat Not Completed: Fatigue/lethargy limiting ability to participate  Betsy Pries 05/05/2017, 2:55 PM

## 2017-05-05 NOTE — Progress Notes (Signed)
Call placed to [facility] [phone] this am to obtain order for colostomy pouch change. Awaiting follow up call for order.

## 2017-05-05 NOTE — Progress Notes (Signed)
7 Days Post-Op    CC:  Abdominal pain  Subjective: Says she continues to have ongoing pain, and it never really goes away completely.  Nothing but fluid in the ostomy.   Objective: Vital signs in last 24 hours: Temp:  [97.7 F (36.5 C)-97.9 F (36.6 C)] 97.9 F (36.6 C) (08/01 0501) Pulse Rate:  [55-60] 59 (08/01 0501) Resp:  [16-19] 16 (08/01 0501) BP: (162-168)/(57-68) 162/68 (08/01 0501) SpO2:  [94 %-96 %] 94 % (08/01 0501) Last BM Date: 05/01/17 660 PO 1590  IV Urine 200 recorded 100 stool recorded Afebrile, VSS CBC shows slow decline in WBC, anemia is stable  Intake/Output from previous day: 07/31 0701 - 08/01 0700 In: 2250 [P.O.:660; I.V.:1590] Out: 300 [Urine:200; Stool:100] Intake/Output this shift: Total I/O In: 60 [P.O.:60] Out: -   General appearance: alert, cooperative and no distress Resp: clear to auscultation bilaterally GI: soft, ostomy OK.. I tried to digitalize the ostomy and I think that the issue is the bar and rib position is making it difficult to get stool flow thru this junction.Marland Kitchen  it is very tight.  Lab Results:   Recent Labs  05/04/17 0413 05/05/17 0516  WBC 13.0* 12.4*  HGB 8.8* 9.0*  HCT 26.6* 26.6*  PLT 395 410*    BMET  Recent Labs  05/03/17 0456 05/04/17 0413  NA 131* 132*  K 3.6 3.5  CL 102 102  CO2 21* 23  GLUCOSE 93 74  BUN 15 17  CREATININE 1.00 1.04*  CALCIUM 7.9* 7.9*   PT/INR No results for input(s): LABPROT, INR in the last 72 hours.  No results for input(s): AST, ALT, ALKPHOS, BILITOT, PROT, ALBUMIN in the last 168 hours.   Lipase     Component Value Date/Time   LIPASE 24 04/25/2017 1504     Medications: . acetaminophen  325 mg Oral Q6H  . feeding supplement  1 Container Oral BID BM  . hydrocortisone   Rectal QID  . multivitamin  1 tablet Oral Daily  . pantoprazole (PROTONIX) IV  40 mg Intravenous Q12H  . senna-docusate  2 tablet Oral Daily  . sodium phosphate  1 enema Rectal Once     Assessment/Plan  Metastatic colon cancer to liver/retroperitoneal  Adenopathy/left ureter obstuction Large bowel obstruction - s/p open sigmoidectomy with repair of colovesicular fistula 01/14/17 by Dr. Marcello Moores - 1 week of progressive lower abdominal pain, n/v, anorexia, constipation - CT scan showed large bowel obstruction due to recurrent tumor at sigmoid anastomosis with large amount of fecal matter proximal to this region, left hydronephrosis, and likely small metastatic liver lesions - not a candidate for stent placement LAPAROSCOPIC ASSISTED DIVERTING LOOP COLOSTOMY CYSTOSCOPY WITH LEFT RETROGRADE PYELOGRAM/ [place], 04/28/17, Dr. Lynnette Caffey  POD 6 Renal insufficiency/Left hydronephrosis - per urology Anemia - Hg 10.2, stable DVT - on eliquis at home  ID - rocephin 7/22>>7/24. Cefotetan perioperative VTE - heparin FEN - IVF, clear liquids, Boost breeze    Plan:  I will see about taking out at least one bar.     LOS: 9 days    Charlene [name] 05/05/2017 [phone]

## 2017-05-05 NOTE — Care Management Note (Signed)
Case Management Note  Patient Details  Name: Charlene [name] MRN: [id number] Date of Birth: [birth year]  Subjective/Objective:   Received referral to offer otpt palliative resources-spoke to patient who deferred to son-informed of resources for otpt palliative options-chose [facility] will come to talk to patient/son. CSW already following for SNF w/palliative care services.                 Action/Plan:d/c plan SNF w/palliative care services.   Expected Discharge Date:  04/27/17               Expected Discharge Plan:  Skilled Nursing Facility  In-House Referral:  Clinical Social Work  Discharge planning Services  CM Consult  Post Acute Care Choice:    Choice offered to:     DME Arranged:    DME Agency:     HH Arranged:    [facility] Agency:     Status of Service:  In process, will continue to follow  If discussed at Long Length of Stay Meetings, dates discussed:    Additional Comments:  Dessa Phi, RN 05/05/2017, 12:45 PM

## 2017-05-05 NOTE — Care Management Important Message (Signed)
Important Message  Patient Details  Name: Charlene [name] MRN: [id number] Date of Birth: Apr 29, 1928   Medicare Important Message Given:  Yes    Kerin Salen 05/05/2017, 10:54 AMImportant Message  Patient Details  Name: Charlene [name] MRN: [id number] Date of Birth: [birth year]   Medicare Important Message Given:  Yes    Kerin Salen 05/05/2017, 10:53 AM

## 2017-05-05 NOTE — Progress Notes (Signed)
Daily Progress Note   Patient Name: Charlene [name]       Date: 05/05/2017 DOB: Jul 12, 1928  Age: 83 y.o. MRN#: [id number] Attending Physician: Damita Lack, MD Primary Care Physician: Shon Baton, MD Admit Date: 04/25/2017  Reason for Consultation/Follow-up: symptom management, following disease trajectory to help guide decision making  Subjective: Patient denies needs at this time.  Reports that she appreciates visit, but is wanting guidance from Dr. Benay Spice on next steps moving forward.  Son and 2 nieces present in room.   See below:   Length of Stay: 9  Current Medications: Scheduled Meds:  . acetaminophen  325 mg Oral Q6H  . feeding supplement  1 Container Oral BID BM  . hydrocortisone   Rectal QID  . multivitamin  1 tablet Oral Daily  . pantoprazole (PROTONIX) IV  40 mg Intravenous Q12H  . senna-docusate  2 tablet Oral Daily  . sodium phosphate  1 enema Rectal Once    Continuous Infusions: . sodium chloride 75 mL/hr at 05/05/17 0459  . heparin 450 Units/hr (05/04/17 1334)  . methocarbamol (ROBAXIN)  IV Stopped (05/01/17 1049)    PRN Meds: docusate sodium, hydrALAZINE, HYDROmorphone (DILAUDID) injection, hydroxypropyl methylcellulose / hypromellose, methocarbamol (ROBAXIN)  IV, ondansetron (ZOFRAN) IV, ondansetron, opium-belladonna, oxyCODONE, polyethylene glycol, promethazine, simethicone, sodium phosphate  Physical Exam         NAD Thin elderly lady Clear S1 S2 Abdomen not distended but firm.  +BS, passing gas Colostomy bag in LUQ,   No edema Awake alert oriented. Sitting in chair  Vital Signs: BP (!) 162/68 (BP Location: Right Arm)   Pulse (!) 59   Temp 97.9 F (36.6 C) (Oral)   Resp 16   Ht 5' 4"  (1.626 m)   Wt 58.5 kg (129 lb)   SpO2 94%   BMI  22.14 kg/m  SpO2: SpO2: 94 % O2 Device: O2 Device: Nasal Cannula O2 Flow Rate: O2 Flow Rate (L/min): 1 L/min  Intake/output summary:   Intake/Output Summary (Last 24 hours) at 05/05/17 1245 Last data filed at 05/05/17 0956  Gross per 24 hour  Intake             1752 ml  Output              300 ml  Net  1452 ml   LBM: Last BM Date: 05/01/17 Baseline Weight: Weight: 57.1 kg (125 lb 14.1 oz) Most recent weight: Weight: 58.5 kg (129 lb)       Palliative Assessment/Data: PPS 30%   Flowsheet Rows     Most Recent Value  Intake Tab  Referral Department  -- [ER]  Unit at Time of Referral  ER  Palliative Care Primary Diagnosis  Cancer  Date Notified  04/25/17  Palliative Care Type  New Palliative care  Reason for referral  Pain, Clarify Goals of Care  Date of Admission  04/25/17  Date first seen by Palliative Care  04/25/17  # of days Palliative referral response time  0 Day(s)  # of days IP prior to Palliative referral  0  Clinical Assessment  Palliative Performance Scale Score  50%  Pain Max last 24 hours  9  Pain Min Last 24 hours  4  Psychosocial & Spiritual Assessment  Palliative Care Outcomes  Patient/Family meeting held?  Yes  Who was at the meeting?  Patient, son, daughter in law      Patient Active Problem List   Diagnosis Date Noted  . Atelectasis   . Gross hematuria 04/30/2017  . Pyuria   . Deep vein thrombosis (DVT) of lower extremity ([place])   . Malignant neoplasm of sigmoid colon ([place])   . Hydronephrosis   . Encounter for palliative care   . Goals of care, counseling/discussion   . Large bowel obstruction ([place]) 04/26/2017  . Abdominal pain 04/25/2017  . UTI (urinary tract infection) 04/25/2017  . Renal insufficiency 04/25/2017  . Anemia 04/25/2017  . Hypertension 04/25/2017  . Pelvic abscess in female 02/02/2017  . Colovesical fistula with cancer s/p colectomy/repair 01/14/2017 01/18/2017  . 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 Protein-calorie malnutrition, severe 02/01/2016  . Depression 01/31/2016  . GERD (gastroesophageal reflux disease) 01/31/2016  . Hemorrhoids 01/31/2016  . Acute diverticulitis 05/05/2015  . Abnormal LFTs 05/05/2015  . Dyslipidemia 03/13/2015  . Leukocytosis 03/13/2015  . Acute renal failure ([place]) 03/13/2015    Palliative Care Assessment & Plan   Patient Profile:    Assessment:  metastatic colon cancer Constipation Abdominal pain Large bowel obstruction  Recommendations/Plan:  s/p diverting colostomy     surgery following  I met briefly with patient and her son. 2 nieces also present so we limited conversation this encounter as she did not want to discuss everything in front of them.  She reports not having further questions at this time.  Her son did want to talk further and she was agreeable to me speaking with him outside of the room (while she visited with her other company).   Simona Huh and I discussed about goals, wishes and values. I discussed with him about SNF rehab + following up with onc as outpatient.  I recommended palliative care to follow-up with her in the short term while at rehab.  We also discussed again possibility of residential hospice on d/c.  He is very supportive of whatever decision his mother makes, but he is obviously concerned with making decisions that are likely to allow her as much comfort as possible in light of incurable disease.  He agrees that best plan based on his mother's current wishes is for transition to [facility] as they have previously been arranging. She has done well with rehabbing in the past. If she does well at home and continues to thrive, I encouraged they continue with this plan  and follow-up with Dr. Benay Spice on Aug 9th as scheduled to discuss likely benefit and burdens of further disease modifying therapy.  If, however she is unable to regain function and he continues to decline, I recommended  that she speak with outpatient palliative care provider at the SNF to determine if she may be better served by focusing care on comfort with support of organization such as hospice.  I asked care management to help ensure palliative follow-up occurs in short term per son's request for continued guidance at SNF.    Code Status:    Code Status Orders        Start     Ordered   04/25/17 2150  Do not attempt resuscitation (DNR)  Continuous    Question Answer Comment  In the event of cardiac or respiratory ARREST Do not call a "code blue"   In the event of cardiac or respiratory ARREST Do not perform Intubation, CPR, defibrillation or ACLS   In the event of cardiac or respiratory ARREST Use medication by any route, position, wound care, and other measures to relive pain and suffering. May use oxygen, suction and manual treatment of airway obstruction as needed for comfort.      04/25/17 2149    Code Status History    Date Active Date Inactive Code Status Order ID Comments User Context   02/03/2017  2:36 AM 02/07/2017  8:41 PM DNR [id number]  Nila Nephew, RN Inpatient   02/02/2017  7:22 PM 02/03/2017  2:36 AM Full Code [id number]  Armandina Gemma, MD ED   01/11/2017  9:26 AM 01/21/2017  5:14 PM Full Code [id number]  Leighton Ruff, MD Inpatient   01/31/2016  2:09 AM 02/02/2016  3:30 PM Full Code [id number]  Vianne Bulls, MD Inpatient   05/05/2015  9:12 PM 05/08/2015  1:15 PM Full Code [id number]  Reubin Milan, MD Inpatient   03/13/2015  5:20 PM 03/15/2015  5:34 PM Full Code [id number]  Robbie Lis, MD Inpatient       Prognosis:   guarded  Discharge Planning:  SNF rehab with palliative.  I asked CM to help arrange palliative follow-up.  She will benefit from meeting with palliative once she is at rehab for a few days (with palliative checking in during the week while waiting for follow-up with Dr. Benay Spice if possible).  Care plan was discussed with  Patient, bedside RN, TRH MD,  surgery Thank you for allowing the Palliative Medicine Team to assist in the care of this patient.   Time In: 1210 Time Out: 1240 Total Time 30 Prolonged Time Billed  no       Greater than 50%  of this time was spent counseling and coordinating care related to the above assessment and plan.  Micheline Rough, MD [phone]  Please contact Palliative Medicine Team phone at [phone] for questions and concerns.

## 2017-05-05 NOTE — Progress Notes (Signed)
ANTICOAGULATION CONSULT NOTE    Pharmacy Consult for heparin Indication: DVT - bridge while off apixaban for surgery  No Known Allergies  Patient Measurements: Height: 5\' 4"  (162.6 cm) Weight: 129 lb (58.5 kg) IBW/kg (Calculated) : 54.7 Heparin Dosing Weight: 59kg  Vital Signs: Temp: 97.9 F (36.6 C) (08/01 0501) Temp Source: Oral (08/01 0501) BP: 162/68 (08/01 0501) Pulse Rate: 59 (08/01 0501)  Labs:  Recent Labs  05/03/17 0456 05/04/17 0413 05/05/17 0516  HGB 9.4* 8.8* 9.0*  HCT 28.2* 26.6* 26.6*  PLT 382 395 410*  HEPARINUNFRC 0.57 0.56 0.49  CREATININE 1.00 1.04*  --     Estimated Creatinine Clearance: 33.5 mL/min (A) (by C-G formula based on SCr of 1.04 mg/dL (H)).   Assessment: 34 YOF with a large bowel obstruction with a history of metastatic colon cancer.  She has a history of DVT on apixaban 2.5mg  BID (dose reduced due to bleeding, advanced age & decreased renal function).  Apixaban on hold since patient going to OR 7/25 and bridging with IV heparin.   Last dose of apixaban was 7/22 at 0930  Baseline aPTT = 41 sec,  HL = 1.46 (falsely elevated due to effects of apixaban),  INR = 1.37  05/05/2017:  Heparin level continues to be therapeutic on current rate of 450 units/hr  Hg low, but stable.  Pltc WNL  Hematuria stable per RN   Goal of Therapy:  Heparin level 0.3-0.7 (target low end of goal range per MD due to hematuria) Monitor platelets by anticoagulation protocol: Yes   Pan:  Continue heparin infusion at 450 units/hr     Daily CBC and heparin level  Follow up plans for transition back to apixaban - continue to hold another day or so per surgery 7/30 note since no reversal agent.   Closely f/u blood in colostomy and foley   Adrian Saran, PharmD, BCPS Pager [phone] 05/05/2017 8:11 AM

## 2017-05-05 NOTE — Consult Note (Signed)
[name] ostomy follow up Stoma type/location: loop colostomy Orders to remove plastic bridge, removed without difficulty today Stomal assessment/size: 2 1/4" round, budded, pink, and moist with some minor mucosal sloughing Peristomal assessment: intact  Treatment options for stomal/peristomal skin: continued the use of 2" barrier ring for now just to make sure seal/leakage improved Output no stool, still bloody Ostomy pouching: 2pc. 2 3/4" used with 2" barrier ring Education provided:  Patient able to close and open lock and roll closure today. No output to teach her to empty today but she did talk through steps.  I cleaned her skin and stoma and she did observe this.  I placed new skin barrier and she attached the pouch to the wafer with some assistance to make sure secured all the way around the flange. No family in the room today. Patient continues to report ileus and discomfort, nausea    WOC Nurse will follow along with you for continued support with ostomy teaching and care [place] MSN, [place], [place], [place], [place]

## 2017-05-05 NOTE — Progress Notes (Signed)
Physical Therapy Treatment Patient Details Name: Charlene [name] MRN: [id number] DOB: [birth year] Today's Date: 05/05/2017    History of Present Illness Patient is a 83 y.o. female with metastatic colon cancer with extensive pelvic disease causing left ureteral obstruction, DVT on eliquis,. Patient presented with fatigue, severe constipation and abdominal pain .S/P LAPAROSCOPIC ASSISTED DIVERTING  LOOP COLOSTOMY on 7/25.    PT Comments    Pt remains weak and she fatigues easily. She did agree to take a few steps in room. She walked ~10 feet today and sat in recliner afterwards. Will continue to follow and progress activity as able.    Follow Up Recommendations  SNF (depending on pt progress and decision)     Equipment Recommendations  None recommended by PT    Recommendations for Other Services       Precautions / Restrictions Precautions Precautions: Fall Precaution Comments: colostomy Restrictions Weight Bearing Restrictions: No    Mobility  Bed Mobility Overal bed mobility: Needs Assistance Bed Mobility: Supine to Sit     Supine to sit: HOB elevated;Mod assist     General bed mobility comments: Increased time. Assist for trunk and LEs. Pt relied on bedrail as well. Utilized bedpad to aid with scooting.   Transfers Overall transfer level: Needs assistance Equipment used: Rolling walker (2 wheeled) Transfers: Sit to/from Stand Sit to Stand: Min assist;From elevated surface         General transfer comment: Assist to rise, stabilize, control descent. VCs safety, hand placement.   Ambulation/Gait Ambulation/Gait assistance: Min assist Ambulation Distance (Feet): 10 Feet Assistive device: Rolling walker (2 wheeled) Gait Pattern/deviations: Step-through pattern;Decreased stride length;Decreased step length - right;Decreased step length - left;Trunk flexed     General Gait Details: Pt agreed to short walk in room. Assist to stabilize pt throughout short distance. Pt  is weak and fatigues easily.    Stairs            Wheelchair Mobility    Modified Rankin (Stroke Patients Only)       Balance Overall balance assessment: Needs assistance         Standing balance support: Bilateral upper extremity supported Standing balance-Leahy Scale: Poor                              Cognition Arousal/Alertness: Awake/alert Behavior During Therapy: WFL for tasks assessed/performed Overall Cognitive Status: Within Functional Limits for tasks assessed                                        Exercises      General Comments        Pertinent Vitals/Pain Pain Assessment: Faces Faces Pain Scale: Hurts even more Pain Location: abdomen Pain Descriptors / Indicators: Discomfort;Grimacing;Guarding Pain Intervention(s): Limited activity within patient's tolerance;Repositioned    Home Living                      Prior Function            PT Goals (current goals can now be found in the care plan section) Progress towards PT goals: Progressing toward goals    Frequency    Min 3X/week      PT Plan Current plan remains appropriate    Co-evaluation              AM-PAC  PT "6 Clicks" Daily Activity  Outcome Measure  Difficulty turning over in bed (including adjusting bedclothes, sheets and blankets)?: Total Difficulty moving from lying on back to sitting on the side of the bed? : Total Difficulty sitting down on and standing up from a chair with arms (e.g., wheelchair, bedside commode, etc,.)?: Total Help needed moving to and from a bed to chair (including a wheelchair)?: A Little Help needed walking in hospital room?: A Little Help needed climbing 3-5 steps with a railing? : Total 6 Click Score: 10    End of Session   Activity Tolerance: Patient limited by fatigue;Patient limited by pain Patient left: in chair;with call bell/phone within reach;with family/visitor present   PT Visit  Diagnosis: Muscle weakness (generalized) (M62.81);Difficulty in walking, not elsewhere classified (R26.2)     Time: 0321-2248 PT Time Calculation (min) (ACUTE ONLY): 14 min  Charges:  $Gait Training: 8-22 mins                    G Codes:          Weston Anna, MPT Pager: [phone]

## 2017-05-06 DIAGNOSIS — R1900 Intra-abdominal and pelvic swelling, mass and lump, unspecified site: Secondary | ICD-10-CM

## 2017-05-06 DIAGNOSIS — K5900 Constipation, unspecified: Secondary | ICD-10-CM

## 2017-05-06 LAB — CBC
HEMATOCRIT: 25.7 % — AB (ref 36.0–46.0)
HEMOGLOBIN: 8.7 g/dL — AB (ref 12.0–15.0)
MCH: 26.9 pg (ref 26.0–34.0)
MCHC: 33.9 g/dL (ref 30.0–36.0)
MCV: 79.3 fL (ref 78.0–100.0)
Platelets: 388 10*3/uL (ref 150–400)
RBC: 3.24 MIL/uL — AB (ref 3.87–5.11)
RDW: 17.5 % — ABNORMAL HIGH (ref 11.5–15.5)
WBC: 12 10*3/uL — AB (ref 4.0–10.5)

## 2017-05-06 LAB — HEPARIN LEVEL (UNFRACTIONATED): HEPARIN UNFRACTIONATED: 0.41 [IU]/mL (ref 0.30–0.70)

## 2017-05-06 MED ORDER — ADULT MULTIVITAMIN W/MINERALS CH: 1.0000 | ORAL_TABLET | Freq: Every day | ORAL | Status: DC

## 2017-05-06 MED ORDER — BOOST / RESOURCE BREEZE PO LIQD
1.0000 | ORAL | Status: DC
  Administered 2017-05-08: 1 via ORAL

## 2017-05-06 NOTE — Progress Notes (Signed)
8 Days Post-Op    CC:  Abdominal pain  Subjective: She looks better this AM.  Her bag is full of liquid stool.  She says she had an enema yesterday,but no results.  She was unaware the bag is full this AM now.    Objective: Vital signs in last 24 hours: Temp:  [97.8 F (36.6 C)-97.9 F (36.6 C)] 97.8 F (36.6 C) (08/02 0447) Pulse Rate:  [59-66] 66 (08/02 0447) Resp:  [16-18] 16 (08/02 0447) BP: (150-158)/(57-64) 150/57 (08/02 0447) SpO2:  [96 %-98 %] 96 % (08/02 0447) Last BM Date: 05/01/17  Intake/Output from previous day: 08/01 0701 - 08/02 0700 In: 1920 [P.O.:120; I.V.:1800] Out: 325 [Urine:325] Intake/Output this shift: No intake/output data recorded.  General appearance: alert, cooperative and no distress Resp: clear to auscultation bilaterally and anterior exam GI: soft, sites OK, bag is full of liquid stool right now.  Lab Results:   Recent Labs  05/05/17 0516 05/06/17 0436  WBC 12.4* 12.0*  HGB 9.0* 8.7*  HCT 26.6* 25.7*  PLT 410* 388    BMET  Recent Labs  05/04/17 0413  NA 132*  K 3.5  CL 102  CO2 23  GLUCOSE 74  BUN 17  CREATININE 1.04*  CALCIUM 7.9*   PT/INR No results for input(s): LABPROT, INR in the last 72 hours.  No results for input(s): AST, ALT, ALKPHOS, BILITOT, PROT, ALBUMIN in the last 168 hours.   Lipase     Component Value Date/Time   LIPASE 24 04/25/2017 1504     Medications: . acetaminophen  325 mg Oral Q6H  . feeding supplement  1 Container Oral BID BM  . hydrocortisone   Rectal QID  . multivitamin  1 tablet Oral Daily  . pantoprazole (PROTONIX) IV  40 mg Intravenous Q12H  . senna-docusate  2 tablet Oral Daily  . sodium phosphate  1 enema Rectal Once    Assessment/Plan Metastatic colon cancer to liver/retroperitoneal Adenopathy/left ureter obstuction Large bowel obstruction - s/p open sigmoidectomy with repair of colovesicular fistula 01/14/17 by Dr. Marcello Moores - 1 week of progressive lower abdominal pain, n/v,  anorexia, constipation - CT scan showed large bowel obstruction due to recurrent tumor at sigmoid anastomosis with large amount of fecal matter proximal to this region, left hydronephrosis, and likely small metastatic liver lesions - not a candidate for stent placement LAPAROSCOPIC ASSISTED DIVERTING LOOP COLOSTOMYCYSTOSCOPY WITH LEFT RETROGRADE PYELOGRAM/ [place], 04/28/17, Dr. Lynnette Caffey POD 6 Renal insufficiency/Left hydronephrosis - per urology Anemia - Hg 10.2, stable DVT - on eliquis at home  ID - rocephin 7/22>>7/24. Cefotetan perioperative VTE - heparin FEN - IVF, clear liquids, Boost breeze  Plan: Bars came out and she has good stool from the ostomy now.   I am going to give her a soft diet and work on mobilizing her more today.  She can start using the Ostomy now with help of staff.         LOS: 10 days    Charlene [name] 05/06/2017 [phone]

## 2017-05-06 NOTE — Progress Notes (Signed)
Initial Nutrition Assessment  DOCUMENTATION CODES:   Severe malnutrition in context of chronic illness  INTERVENTION:  - Will decrease Boost Breeze to once/day, this supplement provides 250 kcal and 9 grams of protein - Will order Magic Cup on all dinner trays, this supplement provides 290 kcal and 9 grams of protein - Continue to encourage PO intakes of meals and supplements. - RD will continue to monitor for additional nutrition-related needs.  NUTRITION DIAGNOSIS:   Malnutrition (severe) related to chronic illness, catabolic illness, cancer and cancer related treatments as evidenced by severe depletion of body fat, severe depletion of muscle mass.  GOAL:   Patient will meet greater than or equal to 90% of their needs  MONITOR:   PO intake, Weight trends, Labs, I & O's  REASON FOR ASSESSMENT:   LOS (day 10)  ASSESSMENT:   83 year old female with past medical history of metastatic colon cancer to liver and to the hospital with complaints of lower abdominal pain, constipation and nausea. CT of the abdomen pelvis showed liver metastases with persistent left-sided hydro-due to ureteral obstruction in the left helical retroperitoneal and large bowel obstruction likely secondary to large amount of fecal matter.  Pt seen for LOS. BMI indicates normal weight; weight +1.4 kg since admission and admission weight used in estimating nutrition needs.  On 01/14/17 pt underwent open sigmoidectomy with repair of colovesicular fistula. CT on admission found large bowel obstruction 2/2 recurrent tumor at sigmoid anastomosis with a large amount of fecal matter. On 7/25 she underweight lap-assisted diverting loop colostomy.   Diet was advanced from NPO to CLD on 7/25 @ 1946 and was advanced from CLD to [place] today at 18. While on CLD, pt was mainly consuming ~50%. Boost Breeze was order BID but pt refused this 50-75% of the time; will decrease to once a day and also trial Magic Cup now that she is on  FLD.   Per Dr. Latina [name] note this afternoon: This obstruction is most likely caused by fecal matter worsening malignant mass. Plans on giving her an enema today again after being unsuccessful yesterday for likely constipation. Repeat AXR yesterday shows Cecal dilatation. Plan to try some diet today and if it continues to worsen she will have to be made npo. If developed nausea/ vomiting- she may need NGT.  Physical assessment shows severe muscle and fat wasting which is consistent with findings by RDs on 01/19/17 and 02/03/17. Per chart review, pt lost 5 lbs (4% body weight) from 02/11/17-03/22/17; this is not significant for time frame. Weight is +4 lbs from admission which is likely related to fluid. Will monitor weight trends closely.   Medications reviewed; 1 tablet Prosight/day, 40 mg IV Protonix BID, 2 tablets Senokot BID. Labs reviewed; Na: 132 mmol/L, creatinine: 1.04 mg/dL, Ca: 7.9 mg/dL, GFR: 47 mL/min.   IVF: D5-NS-40 mEq KCl @ 75 mL/hr (306 kcal from dextrose).   Diet Order:  Diet full liquid Room service appropriate? Yes; Fluid consistency: Thin  Skin:  Wound (see comment) (Abdominal incision from 7/25)  Last BM:  8/2 via colostomy  Height:   Ht Readings from Last 1 Encounters:  04/28/17 5\' 4"  (1.626 m)    Weight:   Wt Readings from Last 1 Encounters:  04/28/17 129 lb (58.5 kg)    Ideal Body Weight:  54.54 kg  BMI:  Body mass index is 22.14 kg/m.  Estimated Nutritional Needs:   Kcal:  1715-2000 (30-35 kcal/kg)  Protein:  80-95 grams  Fluid:  >/= 2 L/day  EDUCATION NEEDS:   No education needs identified at this time    Charlene Matin, MS, RD, LDN, [facility] Inpatient Clinical Dietitian Pager # [phone] After hours/weekend pager # [phone]

## 2017-05-06 NOTE — Progress Notes (Signed)
ANTICOAGULATION CONSULT NOTE    Pharmacy Consult for heparin Indication: hx DVT - bridge while off apixaban for surgery  No Known Allergies  Patient Measurements: Height: 5\' 4"  (162.6 cm) Weight: 129 lb (58.5 kg) IBW/kg (Calculated) : 54.7 Heparin Dosing Weight: 59kg  Vital Signs: Temp: 97.8 F (36.6 C) (08/02 0447) Temp Source: Oral (08/02 0447) BP: 150/57 (08/02 0447) Pulse Rate: 66 (08/02 0447)  Labs:  Recent Labs  05/04/17 0413 05/05/17 0516 05/06/17 0436  HGB 8.8* 9.0* 8.7*  HCT 26.6* 26.6* 25.7*  PLT 395 410* 388  HEPARINUNFRC 0.56 0.49 0.41  CREATININE 1.04*  --   --     Estimated Creatinine Clearance: 33.5 mL/min (A) (by C-G formula based on SCr of 1.04 mg/dL (H)).   Assessment: 59 YOF with a large bowel obstruction with a history of metastatic colon cancer.  She has a history of DVT on apixaban 2.5mg  BID (dose reduced due to bleeding, advanced age & decreased renal function).  Apixaban on hold since patient going to OR 7/25 (colostomy) and bridging with IV heparin.   Last dose of apixaban was 7/22 at 0930  Baseline aPTT = 41 sec,  HL = 1.46 (falsely elevated due to effects of apixaban),  INR = 1.37  Today, 05/06/2017:  Heparin level remains therapeutic at 0.41  hgb down slightly 8.7, plt ok   Goal of Therapy:  Heparin level 0.3-0.7 (target low end of goal range per MD due to hematuria) Monitor platelets by anticoagulation protocol: Yes   Pan:  Continue heparin infusion at 450 units/hr     Daily CBC and heparin level  Follow up plans for transition back to apixaban   Monitor for s/s bleeding   Dia Sitter, PharmD, BCPS 05/06/2017 8:56 AM

## 2017-05-06 NOTE — Progress Notes (Signed)
PROGRESS NOTE    Charlene [name]  EZM:[id number] DOB: October 21, 1927 DOA: 04/25/2017 PCP: Shon Baton, MD   Brief Narrative:  83 year old female with past medical history of metastatic colon cancer to liver and to the hospital with complaints of lower abdominal pain, constipation and nausea. CT of the abdomen pelvis showed liver metastases with persistent left-sided hydro-due to ureteral obstruction in the left helical retroperitoneal and large bowel obstruction likely secondary to large amount of fecal matter.   Assessment & Plan:   Principal Problem:   Abdominal pain Active Problems:   UTI (urinary tract infection)   Renal insufficiency   Anemia   Hypertension   Large bowel obstruction (HCC)   Malignant neoplasm of sigmoid colon (HCC)   Hydronephrosis   Encounter for palliative care   Goals of care, counseling/discussion   Pyuria   Deep vein thrombosis (DVT) of lower extremity (HCC)   Gross hematuria   Atelectasis  Large bowel obstruction; persist Metastatic colon cancer to the liver -This obstruction is most likely caused by fecal matter worsens malignant mass -Gastroenterology and general surgery is following -Plans on giving her an enema today again after being unsuccessful yesterday for likely constipation. Repeat AXR yesterday shows Cecal dilatation. Surgery team is following. Plan to try some diet today and if it continues to worsen she will have to be made npo. If developed nausea/ vomiting- she may need NGT. -Palliative care team following.   Acute kidney injury with left-sided hydronephrosis obstruction; stable  -repeat labs ordered for tomorrow.  -No urgent intervention per urology. Patient is status post cystoscopy on 04/28/2017 which showed complete left-sided urethral obstruction therefore unable to cannulate. 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 Meanwhile continue heparin drip  Anemia of chronic disease -No signs of active blood loss. Patient is on heparin drip therefore will closely monitor -Hemoglobin is 9 which is baseline.  DVT prophylaxis: Heparin drip Code Status: DO NOT RESUSCITATE Family Communication:  None at bedside Disposition Plan: To be determined  Consultants:   Urology  General surgery  Palliative care  Gastroenterology  Wound care  Procedures:   Dr Ninfa Linden- Laparoscopy assisted diverting loop colostomy on July 20 15,018  Dr Alinda Money- Cystourethroscopy on July 20 15,018  Antimicrobials:   Rocephin 7/22-7/24   Subjective: Patient still reports of abdominal distention. Unsuccessful attempt with any bowel movement yesterday but the enema. She has some output in her ostomy bag this morning. Remains afebrile.  Objective: Vitals:   05/04/17 2133 05/05/17 0501 05/05/17 2126 05/06/17 0447  BP: (!) 168/57 (!) 162/68 (!) 158/64 (!) 150/57  Pulse: (!) 55 (!) 59 (!) 59 66  Resp: 18 16 18 16   Temp: 97.7 F (36.5 C) 97.9 F (36.6 C) 97.9 F (36.6 C) 97.8 F (36.6 C)  TempSrc: Oral Oral Oral Oral  SpO2: 96% 94% 98% 96%  Weight:      Height:        Intake/Output Summary (Last 24 hours) at 05/06/17 1212 Last data filed at 05/06/17 1042  Gross per 24 hour  Intake             1560 ml  Output              650 ml  Net              910 ml   Filed  Weights   04/26/17 0550 04/27/17 0610 04/28/17 1321  Weight: 57.8 kg (127 lb 6.8 oz) 58.6 kg (129 lb 1.6 oz) 58.5 kg (129 lb)    Examination:  General exam: Appears calm and comfortable  Respiratory system: Clear to auscultation. Respiratory effort normal. Cardiovascular system: S1 & S2 heard, RRR. No JVD, murmurs, rubs, gallops or clicks. No pedal edema. Gastrointestinal system: Ostomy bag in place which is full off liquis stool Abdomen is distended, soft and mild tenderness to deep palpation. . No  organomegaly or masses felt. Normal bowel sounds heard. Central nervous system: Alert and oriented. No focal neurological deficits. Extremities: Symmetric 5 x 5 power. Skin: No rashes, lesions or ulcers Psychiatry: Judgement and insight appear normal. Mood & affect appropriate.     Data Reviewed:   CBC:  Recent Labs Lab 05/02/17 0121 05/03/17 0456 05/04/17 0413 05/05/17 0516 05/06/17 0436  WBC 18.8* 16.9* 13.0* 12.4* 12.0*  HGB 9.5* 9.4* 8.8* 9.0* 8.7*  HCT 28.0* 28.2* 26.6* 26.6* 25.7*  MCV 79.3 79.9 79.4 79.2 79.3  PLT 325 382 395 410* 161   Basic Metabolic Panel:  Recent Labs Lab 04/30/17 0435 05/01/17 0502 05/02/17 0121 05/03/17 0456 05/04/17 0413  NA 133* 133* 132* 131* 132*  K 3.6 3.4* 3.5 3.6 3.5  CL 107 103 103 102 102  CO2 19* 22 20* 21* 23  GLUCOSE 132* 114* 86 93 74  BUN 19 15 14 15 17   CREATININE 1.07* 1.14* 0.99 1.00 1.04*  CALCIUM 7.8* 7.9* 7.9* 7.9* 7.9*   GFR: Estimated Creatinine Clearance: 33.5 mL/min (A) (by C-G formula based on SCr of 1.04 mg/dL (H)). Liver Function Tests: No results for input(s): AST, ALT, ALKPHOS, BILITOT, PROT, ALBUMIN in the last 168 hours. No results for input(s): LIPASE, AMYLASE in the last 168 hours. No results for input(s): AMMONIA in the last 168 hours. Coagulation Profile: No results for input(s): INR, PROTIME in the last 168 hours. Cardiac Enzymes: No results for input(s): CKTOTAL, CKMB, CKMBINDEX, TROPONINI in the last 168 hours. BNP (last 3 results) No results for input(s): PROBNP in the last 8760 hours. HbA1C: No results for input(s): HGBA1C in the last 72 hours. CBG: No results for input(s): GLUCAP in the last 168 hours. Lipid Profile: No results for input(s): CHOL, HDL, LDLCALC, TRIG, CHOLHDL, LDLDIRECT in the last 72 hours. Thyroid Function Tests: No results for input(s): TSH, T4TOTAL, FREET4, T3FREE, THYROIDAB in the last 72 hours. Anemia Panel: No results for input(s): VITAMINB12, FOLATE, FERRITIN,  TIBC, IRON, RETICCTPCT in the last 72 hours. Sepsis Labs: No results for input(s): PROCALCITON, LATICACIDVEN in the last 168 hours.  Recent Results (from the past 240 hour(s))  Surgical pcr screen     Status: None   Collection Time: 04/27/17  9:18 PM  Result Value Ref Range Status   MRSA, PCR NEGATIVE NEGATIVE Final   Staphylococcus aureus NEGATIVE NEGATIVE Final    Comment:        The Xpert SA Assay (FDA approved for NASAL specimens in patients over 100 years of age), is one component of a comprehensive surveillance program.  Test performance has been validated by [facility] for patients greater than or equal to 66 year old. It is not intended to diagnose infection nor to guide or monitor treatment.   Culture, blood (Routine X 2) w Reflex to ID Panel     Status: None   Collection Time: 04/30/17 10:02 AM  Result Value Ref Range Status   Specimen Description BLOOD LEFT HAND  Final   Special Requests AEROBIC BOTTLE ONLY Blood Culture adequate volume  Final   Culture   Final    NO GROWTH 5 DAYS Performed at [facility], [place] [address], [place], [place] [address]    Report Status 05/05/2017 FINAL  Final  Culture, blood (Routine X 2) w Reflex to ID Panel     Status: None   Collection Time: 04/30/17 10:02 AM  Result Value Ref Range Status   Specimen Description BLOOD LEFT HAND  Final   Special Requests AEROBIC BOTTLE ONLY Blood Culture adequate volume  Final   Culture   Final    NO GROWTH 5 DAYS Performed at [facility], [place] [address] [place], [place] [address]    Report Status 05/05/2017 FINAL  Final  Culture, Urine     Status: Abnormal   Collection Time: 04/30/17 12:14 PM  Result Value Ref Range Status   Specimen Description URINE, CATHETERIZED  Final   Special Requests NONE  Final   Culture (A)  Final    <10,000 COLONIES/mL INSIGNIFICANT GROWTH Performed at [facility], [place] [address], [place], [place] [address]    Report Status 05/01/2017 FINAL   Final         Radiology Studies: Dg Abd 1 View  Result Date: 05/05/2017 CLINICAL DATA:  Constipation. EXAM: ABDOMEN - 1 VIEW COMPARISON:  05/03/2017.  05/01/2017.  CT 04/25/2017. FINDINGS: Surgical can lead again noted in the pelvis. Persistent cecal distention. Cecum measures approximately 14 cm in diameter. Similar findings noted on prior study. No pneumatosis. Contrast previously noted in the colon is unchanged in position. Contrast os at the level of the left pelvis. No contrast noted in the rectum. Thoracolumbar spine degenerative changes both hips . IMPRESSION: Persistent cecal dilatation to 14 cm. Colonic contrast has not moved. Contrast is noted to the level of the sigmoid colon in unchanged position. Electronically Signed   By: Marcello Moores  Register   On: 05/05/2017 15:13        Scheduled Meds: . acetaminophen  325 mg Oral Q6H  . feeding supplement  1 Container Oral BID BM  . hydrocortisone   Rectal QID  . multivitamin  1 tablet Oral Daily  . pantoprazole (PROTONIX) IV  40 mg Intravenous Q12H  . senna-docusate  2 tablet Oral Daily  . sodium phosphate  1 enema Rectal Once   Continuous Infusions: . dextrose 5 % and 0.9 % NaCl with KCl 40 mEq/L 75 mL/hr at 05/06/17 0529  . heparin 450 Units/hr (05/04/17 1334)  . methocarbamol (ROBAXIN)  IV Stopped (05/01/17 1049)     LOS: 10 days    Time spent: 32 mins     Vincent Ehrler Arsenio Loader, MD [facility] Pager [phone]   If 7PM-7AM, please contact night-coverage [email or web address] Password [email or web address] 05/06/2017, 12:12 PM

## 2017-05-06 NOTE — Progress Notes (Signed)
Occupational Therapy Treatment Patient Details Name: Charlene [name] MRN: [id number] DOB: [birth year] Today's Date: 05/06/2017    History of present illness Patient is a 82 y.o. female with metastatic colon cancer with extensive pelvic disease causing left ureteral obstruction, DVT on eliquis,. Patient presented with fatigue, severe constipation and abdominal pain .S/P LAPAROSCOPIC ASSISTED DIVERTING  LOOP COLOSTOMY on 7/25.   OT comments  Pt needed MAX encouragement but did participate.  Follow Up Recommendations  SNF    Equipment Recommendations  None recommended by OT    Recommendations for Other Services      Precautions / Restrictions Precautions Precautions: Fall Precaution Comments: colostomy Restrictions Weight Bearing Restrictions: No       Mobility Bed Mobility               General bed mobility comments: pt in chair  Transfers Overall transfer level: Needs assistance Equipment used: Rolling walker (2 wheeled) Transfers: Sit to/from Stand Sit to Stand: Mod assist Stand pivot transfers: Mod assist       General transfer comment: Assist to rise, stabilize, control descent. VCs safety, hand placement.     Balance Overall balance assessment: Needs assistance         Standing balance support: Bilateral upper extremity supported Standing balance-Leahy Scale: Poor                             ADL either performed or assessed with clinical judgement   ADL Overall ADL's : Needs assistance/impaired                                       General ADL Comments: OT session focused on safe sit to stand transition in preparation for getting to [facility] , OOB and up from chair. Pt needed MAX encouragement to perform 3 sit to stand transitions from [place] Patient Visual Report: No change from baseline            Cognition Arousal/Alertness: Awake/alert Behavior During Therapy: WFL for tasks assessed/performed Overall  Cognitive Status: Within Functional Limits for tasks assessed                                                     Pertinent Vitals/ Pain       Pain Score: 4  Pain Location: abdomen Pain Descriptors / Indicators: Sore Pain Intervention(s): Limited activity within patient's tolerance         Frequency  Min 2X/week        Progress Toward Goals  OT Goals(current goals can now be found in the care plan section)  Progress towards OT goals: Progressing toward goals     Plan Discharge plan remains appropriate       AM-PAC PT "6 Clicks" Daily Activity     Outcome Measure   Help from another person eating meals?: A Little Help from another person taking care of personal grooming?: A Little Help from another person toileting, which includes using toliet, bedpan, or urinal?: A Lot Help from another person bathing (including washing, rinsing, drying)?: A Lot Help from another person to put on and taking off regular upper body clothing?: A Little Help from another person to  put on and taking off regular lower body clothing?: A Lot 6 Click Score: 15    End of Session    OT Visit Diagnosis: Unsteadiness on feet (R26.81);Muscle weakness (generalized) (M62.81)   Activity Tolerance Patient tolerated treatment well   Patient Left in bed;with call bell/phone within reach;with bed alarm set;with family/visitor present   Nurse Communication Mobility status        Time: 3817-7116 OT Time Calculation (min): 25 min  Charges: OT General Charges $OT Visit: 1 Procedure OT Treatments $Self Care/Home Management : 23-37 mins  [place], [place] [phone]   Charlene [name] 05/06/2017, 12:43 PM

## 2017-05-07 LAB — BASIC METABOLIC PANEL
Anion gap: 4 — ABNORMAL LOW (ref 5–15)
BUN: 7 mg/dL (ref 6–20)
CHLORIDE: 107 mmol/L (ref 101–111)
CO2: 22 mmol/L (ref 22–32)
CREATININE: 0.9 mg/dL (ref 0.44–1.00)
Calcium: 7.3 mg/dL — ABNORMAL LOW (ref 8.9–10.3)
GFR calc Af Amer: >60 mL/min (ref >60)
GFR, EST NON AFRICAN AMERICAN: 56 mL/min — AB (ref >60)
Glucose, Bld: 108 mg/dL — ABNORMAL HIGH (ref 65–99)
Potassium: 4 mmol/L (ref 3.5–5.1)
SODIUM: 133 mmol/L — AB (ref 135–145)

## 2017-05-07 LAB — CBC
HEMATOCRIT: 24.9 % — AB (ref 36.0–46.0)
HEMOGLOBIN: 8.5 g/dL — AB (ref 12.0–15.0)
MCH: 26.9 pg (ref 26.0–34.0)
MCHC: 34.1 g/dL (ref 30.0–36.0)
MCV: 78.8 fL (ref 78.0–100.0)
Platelets: 387 10*3/uL (ref 150–400)
RBC: 3.16 MIL/uL — ABNORMAL LOW (ref 3.87–5.11)
RDW: 17.6 % — AB (ref 11.5–15.5)
WBC: 12.3 10*3/uL — AB (ref 4.0–10.5)

## 2017-05-07 LAB — HEPARIN LEVEL (UNFRACTIONATED): HEPARIN UNFRACTIONATED: 0.26 [IU]/mL — AB (ref 0.30–0.70)

## 2017-05-07 MED ORDER — APIXABAN 2.5 MG PO TABS
2.5000 mg | ORAL_TABLET | Freq: Two times a day (BID) | ORAL | Status: DC
  Administered 2017-05-07 – 2017-05-08 (×3): 2.5 mg via ORAL
  Filled 2017-05-07 (×3): qty 1

## 2017-05-07 MED ORDER — HEPARIN (PORCINE) IN NACL 100-0.45 UNIT/ML-% IJ SOLN: 500.0000 [IU]/h | INTRAMUSCULAR | Status: DC

## 2017-05-07 NOTE — Discharge Instructions (Signed)
Colostomy Home Guide, Adult A colostomy is a surgical procedure to make an opening (stoma) for stool (feces) to leave your body. This surgery is done when a medical condition prevents stool from leaving your body through the end of the large intestine (rectum). During the surgery, part of the large intestine (colon) is attached to the stoma that is made in the front of your abdomen. A bag (pouch) is fitted over the stoma. Stool and gas will collect in the bag. After having this surgery, you will need to empty and change your colostomy bag as needed. You will also need to care for the stoma. How do I care for my stoma? Your stoma should look pink, red, and moist, like the inside of your cheek. At first, the stoma may be swollen, but this swelling will go away within 6 weeks. To care for the stoma:  Keep the skin around the stoma clean and dry.  Use a clean, soft washcloth to gently wash the stoma and the skin around it. ? Use warm water and only use cleansers recommended by your health care provider. ? Rinse the stoma area with plain water. ? Dry the area well.  Use stoma powder or ointment on your skin only as told by your health care provider. Do not use any other powders, gels, wipes, or creams on your skin.  Change your colostomy bag if your skin becomes irritated. Irritation may indicate that the bag is leaking.  Check your stoma area every day for signs of infection. Check for: ? More redness, swelling, or pain. ? More fluid or blood. ? Pus or warmth.  Measure the stoma opening regularly and record the size. Watch for changes. Share this information with your health care provider.  How do I care for my colostomy bag? The bag that fits over the stoma can have either one or two pieces.  One-piece bag: The skin barrier and the bag are combined in a single unit.  Two-piece bag: The skin barrier and the bag are separate pieces that attach to each other. 1.   Empty your bag at bedtime  and whenever it is one-third to one-half full. Do not let more stool or gas build up. This could cause the bag to leak. Some colostomy bags have a built-in gas release valve. Change the bag every 3-4 days or as told by your health care provider. Also change the bag if it is leaking or separating from the skin or your skin looks irritated. How do I empty my colostomy bag? Before you leave the hospital, you will be taught how to empty your bag. Follow these basic steps: 1. Wash your hands with soap and water. 2. Sit far back on the toilet. 3. Put several pieces of toilet paper into the toilet water. This will prevent splashing as you empty the stool into the toilet. 4. Remove the clip or the velcro from the tail end of the bag. 5. Unroll the tail, then empty stool into the toilet. 6. Clean the tail with toilet paper. 1.  7. Reroll the tail, and close it with the clip or velcro. 8. Wash your hands again.  How do I change my colostomy bag? Before you leave the hospital, you will be taught how to change your bag. Always have colostomy supplies with you, and follow these basic steps: 1. Wash your hands with soap and water. Have paper towels or tissues near you to clean any discharge. 2. Use a template to pre-cut  the skin barrier. Smooth any rough edges. 3. If using a two-piece bag, attach the bag and the skin barrier to each other. Add the barrier ring, if you use one. 4. If your stools are watery, add a few cotton balls to the new bag to absorb the liquid. 5. Remove the old bag and skin barrier. Gently push the skin away from the barrier with your fingers or a warm cloth. 6. Wash your hands again. Then clean the stoma area as directed with water or with mild soap and water. Use water to rinse away any soap. 7. Dry the skin. You may use the cool setting on a hair dryer to do this. 8. If directed, apply stoma powder or skin barrier gel to the skin. 9. Dry the skin again. 10. Warm the skin barrier  with your hands or a warm compress. 11. Remove the paper from the sticky (adhesive) strip of the skin barrier. 12. Press the adhesive strip onto the skin around the stoma. 13. Gently rub the skin barrier onto the skin. This creates heat that helps the barrier to stick. 14. Apply stoma tape to the edges of the skin barrier.  What are some general tips?  Avoid wearing clothes that are tight directly over your stoma.  You may shower or bathe with the colostomy bag on or off. Do not use harsh or oily soaps or lotions. Dry the skin and bag after bathing.  Store all supplies in a cool, dry place. Do not leave supplies in extreme heat because parts can melt.  Whenever you leave home, take an extra skin barrier and colostomy bag with you.  If your colostomy bag gets wet, you can dry it with a hair dryer on the cool setting.  To prevent odor, put drops of ostomy deodorizer in the colostomy bag. Your health care provider may also recommend putting ostomy lubricant inside the bag. This helps the stool to slide out of the bag more easily and completely. Contact a health care provider if:  You have more redness, swelling, or pain around your stoma.  You have more fluid or blood coming from your stoma.  Your stoma feels warm to the touch.  You have pus coming from your stoma.  Your stoma extends in or out farther than normal.  You need to change the bag every day.  You have a fever. Get help right away if:  Your stool is bloody.  You vomit.  You have trouble breathing. This information is not intended to replace advice given to you by your health care provider. Make sure you discuss any questions you have with your health care provider. Document Released: 09/24/2003 Document Revised: 01/30/2016 Document Reviewed: 01/24/2014 Elsevier Interactive Patient Education  [employer].

## 2017-05-07 NOTE — Progress Notes (Signed)
Case discussed with Palliative Team. Patient must be referred to [facility] through [facility] because this is a contracted preferred home based palliative service provider for all [facility] patients.   Lane Hacker, DO Palliative Medicine [phone]

## 2017-05-07 NOTE — Care Management Note (Signed)
Case Management Note  Patient Details  Name: Charlene [name] MRN: [id number] Date of Birth: November 03, 1927  Subjective/Objective:  POD#10-diverting loop colostomy-colostomy working now-advancing diet. CSW already following for SNF-[facility]. [facility] already following for palliative care services if needed @ SNF.                  Action/Plan:d/c plan SNF w/palliative   Expected Discharge Date:  04/27/17               Expected Discharge Plan:  Skilled Nursing Facility  In-House Referral:  Clinical Social Work  Discharge planning Services  CM Consult  Post Acute Care Choice:    Choice offered to:     DME Arranged:    DME Agency:     HH Arranged:    [place] Agency:     Status of Service:  In process, will continue to follow  If discussed at Long Length of Stay Meetings, dates discussed:    Additional Comments:  Dessa Phi, RN 05/07/2017, 10:56 AM

## 2017-05-07 NOTE — Progress Notes (Signed)
9 Days Post-Op    CC: Abdominal pain  Subjective: She looks good, amazed at all the liquid stool coming out of her colostomy now.  She still feels a little tight, but much better than before.  She is doing well with a full liquid diet.  Port sites all look good.   Objective: Vital signs in last 24 hours: Temp:  [98 F (36.7 C)-98.7 F (37.1 C)] 98.7 F (37.1 C) (08/03 9983) Pulse Rate:  [59-62] 60 (08/03 0632) Resp:  [17-18] 18 (08/03 0632) BP: (148-155)/(53-99) 148/54 (08/03 0632) SpO2:  [98 %] 98 % (08/03 3825) Last BM Date: 05/06/17 720 PO 2065 IV 475 urine recorded Stool 1170 yesterday Afebrile, VSS WBC is still 12.3, HH stable, Na 133, CA 7.3   Intake/Output from previous day: 08/02 0701 - 08/03 0700 In: 2785.8 [P.O.:720; I.V.:2065.8] Out: 0539 [Urine:475; JQBHA:1937] Intake/Output this shift: No intake/output data recorded.  General appearance: alert, cooperative and no distress Resp: clear to auscultation bilaterally GI: soft, sore, she feels soft to me, but says she still feels a little bloated.  Ostomy looks fine thru the pouch and is working well   Stool is still very liquid.  Lab Results:   Recent Labs  05/06/17 0436 05/07/17 0450  WBC 12.0* 12.3*  HGB 8.7* 8.5*  HCT 25.7* 24.9*  PLT 388 387    BMET  Recent Labs  05/07/17 0450  NA 133*  K 4.0  CL 107  CO2 22  GLUCOSE 108*  BUN 7  CREATININE 0.90  CALCIUM 7.3*   PT/INR No results for input(s): LABPROT, INR in the last 72 hours.  No results for input(s): AST, ALT, ALKPHOS, BILITOT, PROT, ALBUMIN in the last 168 hours.   Lipase     Component Value Date/Time   LIPASE 24 04/25/2017 1504     Medications: . acetaminophen  325 mg Oral Q6H  . feeding supplement  1 Container Oral Q24H  . hydrocortisone   Rectal QID  . multivitamin  1 tablet Oral Daily  . pantoprazole (PROTONIX) IV  40 mg Intravenous Q12H  . senna-docusate  2 tablet Oral Daily  . sodium phosphate  1 enema Rectal Once    . dextrose 5 % and 0.9 % NaCl with KCl 40 mEq/L 75 mL/hr at 05/07/17 0558  . heparin    . methocarbamol (ROBAXIN)  IV Stopped (05/01/17 1049)   Assessment/Plan Metastatic colon cancer to liver/retroperitoneal Adenopathy/left ureter obstuction Large bowel obstruction - s/p open sigmoidectomy with repair of colovesicular fistula 01/14/17 by Dr. Marcello Moores - 1 week of progressive lower abdominal pain, n/v, anorexia, constipation - CT scan showed large bowel obstruction due to recurrent tumor at sigmoid anastomosis with large amount of fecal matter proximal to this region, left hydronephrosis, and likely small metastatic liver lesions - not a candidate for stent placement LAPAROSCOPIC ASSISTED DIVERTING LOOP COLOSTOMYCYSTOSCOPY WITH LEFT RETROGRADE PYELOGRAM/ [place], 04/28/17, Dr. Lynnette Caffey POD 6 Renal insufficiency/Left hydronephrosis - per urology Anemia - Hg 10.2, stable DVT - on eliquis at home  ID - rocephin 7/22>>7/24. Cefotetan perioperative VTE - heparin >> going back to apixaban  FEN - IVF, full liquids, Boost breeze   Plan:  Soft diet, she can be up and mobilized more.  She can go home when she is medically stable.  I will put follow up in the AVS.  LOS: 11 days    Halston Kintz 05/07/2017 [id number]

## 2017-05-07 NOTE — Progress Notes (Signed)
CSW following to assist with d/c planning needs. Pt has a SNF bed at [facility] this weekend if pt is stable for dc. Weekend CSW will assist with dc planning needs ( [phone] ).  Werner Lean LCSW [phone]

## 2017-05-07 NOTE — Care Management Note (Signed)
Case Management Note  Patient Details  Name: MIRAH NEVINS MRN: [id number] Date of Birth: [birth year]  Subjective/Objective: Noted patient has [facility] which has their own contracts for palliative care services-through [facility]-patient is agreeable.CSW already following for SNF-[facility].                    Action/Plan:d/c SNF.   Expected Discharge Date:  04/27/17               Expected Discharge Plan:  Skilled Nursing Facility  In-House Referral:  Clinical Social Work  Discharge planning Services  CM Consult  Post Acute Care Choice:    Choice offered to:     DME Arranged:    DME Agency:     HH Arranged:    [place] Agency:     Status of Service:  In process, will continue to follow  If discussed at Long Length of Stay Meetings, dates discussed:    Additional Comments:  Dessa Phi, RN 05/07/2017, 5:15 PM

## 2017-05-07 NOTE — Progress Notes (Addendum)
Physical Therapy Treatment Patient Details Name: Charlene [name] MRN: [id number] DOB: 1928/01/16 Today's Date: 05/07/2017    History of Present Illness Patient is a 83 y.o. female with metastatic colon cancer with extensive pelvic disease causing left ureteral obstruction, DVT on eliquis,. Patient presented with fatigue, severe constipation and abdominal pain .S/P LAPAROSCOPIC ASSISTED DIVERTING  LOOP COLOSTOMY on 7/25.    PT Comments    The patient reports feeling better. Increased ambulation today on RA. O2 saturation = 95% after ambulation. Plans SNF tomorrow.  Follow Up Recommendations  SNF    Equipment Recommendations  None recommended by PT    Recommendations for Other Services       Precautions / Restrictions Precautions Precautions: Fall Precaution Comments: colostomy    Mobility  Bed Mobility   Bed Mobility: Supine to Sit           General bed mobility comments: extra time to mobilize self to bed edge. Use of bed rail. patient did not want any assistance.  Transfers Overall transfer level: Needs assistance Equipment used: Rolling walker (2 wheeled) Transfers: Sit to/from Stand Sit to Stand: Min assist         General transfer comment: Assist to rise, stabilize, VCs safety, hand placement.   Ambulation/Gait Ambulation/Gait assistance: Min assist Ambulation Distance (Feet): 50 Feet Assistive device: Rolling walker (2 wheeled) Gait Pattern/deviations: Step-through pattern     General Gait Details: patient became fatigued  and chair brought up.   Stairs            Wheelchair Mobility    Modified Rankin (Stroke Patients Only)       Balance                                            Cognition Arousal/Alertness: Awake/alert                                            Exercises      General Comments        Pertinent Vitals/Pain Pain Assessment: No/denies pain    Home Living                      Prior Function            PT Goals (current goals can now be found in the care plan section) Progress towards PT goals: Progressing toward goals    Frequency    Min 3X/week      PT Plan Current plan remains appropriate    Co-evaluation              AM-PAC PT "6 Clicks" Daily Activity  Outcome Measure  Difficulty turning over in bed (including adjusting bedclothes, sheets and blankets)?: Total Difficulty moving from lying on back to sitting on the side of the bed? : Total Difficulty sitting down on and standing up from a chair with arms (e.g., wheelchair, bedside commode, etc,.)?: Total Help needed moving to and from a bed to chair (including a wheelchair)?: A Lot Help needed walking in hospital room?: A Lot Help needed climbing 3-5 steps with a railing? : Total 6 Click Score: 8    End of Session   Activity Tolerance: Patient tolerated treatment well Patient left: in chair;with call  bell/phone within reach;with chair alarm set Nurse Communication: Mobility status PT Visit Diagnosis: Muscle weakness (generalized) (M62.81);Difficulty in walking, not elsewhere classified (R26.2)     Time:1551-1617  PT Time Calculation (min) (ACUTE ONLY): 26 mins - Charges:  $Gait Training: X 2                  G CodesTresa Endo PT [phone]     [name] 05/07/2017, 4:46 PM

## 2017-05-07 NOTE — Consult Note (Signed)
[place] Nurse ostomy follow up Stoma type/location: LUQ loop colostomy Stomal assessment/size: 2 inches (down from 2 and 1/4 inches at last measuring), red, raised, moist with sloughing mucosa (20%). Small (1cm) budded stomal protrusion located at 11 o'clock. Peristomal assessment: intact, clear Treatment options for stomal/peristomal skin: skin barrier ring (as stomal edema is reducing) Output: brown liquid effluent. Pouch is over-filled at this time; almost at 90% capacity.  Patient is awake, but unaware that ostomy is full.  We discuss using her left hand to check for amount of effluent and to call nurses when pouch requires emptying. Ostomy pouching: 2pc, 2 and 3/4 inch ostomy pouching system with skin barrier ring. 4 pouching systems at bedside. Education provided: Patient taught to feel for pouch fullness and to call for assistance when it needs to be emptied.  She tells me she was very uncomfortable this morning but that she did not call for pain medication. She is lying flat in the bed as she says this is the position that is most comfortable to her.  She now knows that she must call the nurses for pain medication.  While alert and bright, she is not seeming to understand much other than performing Lock and Roll closure at this time. She tells me that she is still scheduled to go to [facility] sometime for continuing care, but she will be dependent in her ostomy care if discharge is anytime soon. Pouches should be checked every 2 hours to see if they require emptying until she is more participative in care (if she will be). Enrolled patient in [place] Start Discharge program: Yes [facility] nursing team will continue to follow while in house, and  will remain available to this patient, the nursing, surgical and medical teams. Pouch change schedule is twice weekly on Tuesdays and Fridays and PRN. Thanks, Maudie Flakes, MSN, RN, [place], Arther Abbott  Pager# [phone]

## 2017-05-07 NOTE — Progress Notes (Signed)
PROGRESS NOTE    Charlene [name]  GMW:[id number] DOB: [birth year] DOA: 04/25/2017 PCP: Shon Baton, MD   Brief Narrative:  83 year old female with past medical history of metastatic colon cancer to liver and to the hospital with complaints of lower abdominal pain, constipation and nausea. CT of the abdomen pelvis showed liver metastases with persistent left-sided hydro-due to ureteral obstruction in the left helical retroperitoneal and large bowel obstruction likely secondary to large amount of fecal matter.   Assessment & Plan:   Principal Problem:   Abdominal pain Active Problems:   UTI (urinary tract infection)   Renal insufficiency   Anemia   Hypertension   Large bowel obstruction (HCC)   Malignant neoplasm of sigmoid colon (HCC)   Hydronephrosis   Encounter for palliative care   Goals of care, counseling/discussion   Pyuria   Deep vein thrombosis (DVT) of lower extremity (HCC)   Gross hematuria   Atelectasis  Large bowel obstruction; improved  Metastatic colon cancer to the liver -This obstruction is most likely caused by fecal matter worsens malignant mass -Gastroenterology and general surgery is following -advance diet as tolerated today. Her obstruction appears to have self resolved.   Acute kidney injury with left-sided hydronephrosis obstruction; resolved -Cr this morning is 0.90 -No urgent intervention per urology. Patient is status post cystoscopy on 04/28/2017 which showed complete left-sided urethral obstruction therefore unable to cannulate. There was invasive tumor noted in the bladder  History of DVT -restart Eliquis today   Anemia of chronic disease -No signs of active blood loss. Patient is on heparin drip therefore will closely monitor -Hemoglobin is 9 which is baseline.  Patient has an outpatient appointment with Dr. Benay Spice on 05/13/2017. I also urged patient and the family members to discuss her long-term goals of care with palliative care. They do  have arrangements made to speak with them at her nursing facility.  DVT prophylaxis: Heparin drip - will transition her to Eliquis Code Status: DO NOT RESUSCITATE Family Communication:  None at bedside. Spoke with the patient;s son Simona Huh to update him about her condition and possible discharge tom.  Disposition Plan: To be determined  Consultants:   Urology  General surgery  Palliative care  Gastroenterology  Wound care  Procedures:   Dr Ninfa Linden- Laparoscopy assisted diverting loop colostomy on July 20 15,018  Dr Alinda Money- Cystourethroscopy on July 20 15,018  Antimicrobials:   Rocephin 7/22-7/24   Subjective: Patient states she feels much better today. She tells me yesterday she's heard some suctioning sound from her abdomen and suddenyl her belly became soft and had lots of ostomy output. Tolerating diet this morning.   Objective: Vitals:   05/06/17 0447 05/06/17 1424 05/06/17 2048 05/07/17 0632  BP: (!) 150/57 (!) 148/99 (!) 155/53 (!) 148/54  Pulse: 66 (!) 59 62 60  Resp: 16 17 18 18   Temp: 97.8 F (36.6 C) 98 F (36.7 C) 98.2 F (36.8 C) 98.7 F (37.1 C)  TempSrc: Oral Oral Oral Oral  SpO2: 96% 98% 98% 98%  Weight:      Height:        Intake/Output Summary (Last 24 hours) at 05/07/17 1054 Last data filed at 05/07/17 0907  Gross per 24 hour  Intake          2559.83 ml  Output             1320 ml  Net          1239.83 ml   [employer]  04/26/17 0550 04/27/17 0610 04/28/17 1321  Weight: 57.8 kg (127 lb 6.8 oz) 58.6 kg (129 lb 1.6 oz) 58.5 kg (129 lb)    Examination:  General exam: Appears calm and comfortable  Respiratory system: Clear to auscultation. Respiratory effort normal. Cardiovascular system: S1 & S2 heard, RRR. No JVD, murmurs, rubs, gallops or clicks. No pedal edema. Gastrointestinal system: Ostomy bag in place which is full off liquis stool Abdomen is , soft and non tender. . No organomegaly or masses felt. Normal bowel sounds  heard. Central nervous system: Alert and oriented. No focal neurological deficits. Extremities: Symmetric 5 x 5 power. Skin: No rashes, lesions or ulcers Psychiatry: Judgement and insight appear normal. Mood & affect appropriate.     Data Reviewed:   CBC:  Recent Labs Lab 05/03/17 0456 05/04/17 0413 05/05/17 0516 05/06/17 0436 05/07/17 0450  WBC 16.9* 13.0* 12.4* 12.0* 12.3*  HGB 9.4* 8.8* 9.0* 8.7* 8.5*  HCT 28.2* 26.6* 26.6* 25.7* 24.9*  MCV 79.9 79.4 79.2 79.3 78.8  PLT 382 395 410* 388 811   Basic Metabolic Panel:  Recent Labs Lab 05/01/17 0502 05/02/17 0121 05/03/17 0456 05/04/17 0413 05/07/17 0450  NA 133* 132* 131* 132* 133*  K 3.4* 3.5 3.6 3.5 4.0  CL 103 103 102 102 107  CO2 22 20* 21* 23 22  GLUCOSE 114* 86 93 74 108*  BUN 15 14 15 17 7   CREATININE 1.14* 0.99 1.00 1.04* 0.90  CALCIUM 7.9* 7.9* 7.9* 7.9* 7.3*   GFR: Estimated Creatinine Clearance: 38.7 mL/min (by C-G formula based on SCr of 0.9 mg/dL). Liver Function Tests: No results for input(s): AST, ALT, ALKPHOS, BILITOT, PROT, ALBUMIN in the last 168 hours. No results for input(s): LIPASE, AMYLASE in the last 168 hours. No results for input(s): AMMONIA in the last 168 hours. Coagulation Profile: No results for input(s): INR, PROTIME in the last 168 hours. Cardiac Enzymes: No results for input(s): CKTOTAL, CKMB, CKMBINDEX, TROPONINI in the last 168 hours. BNP (last 3 results) No results for input(s): PROBNP in the last 8760 hours. HbA1C: No results for input(s): HGBA1C in the last 72 hours. CBG: No results for input(s): GLUCAP in the last 168 hours. Lipid Profile: No results for input(s): CHOL, HDL, LDLCALC, TRIG, CHOLHDL, LDLDIRECT in the last 72 hours. Thyroid Function Tests: No results for input(s): TSH, T4TOTAL, FREET4, T3FREE, THYROIDAB in the last 72 hours. Anemia Panel: No results for input(s): VITAMINB12, FOLATE, FERRITIN, TIBC, IRON, RETICCTPCT in the last 72 hours. Sepsis  Labs: No results for input(s): PROCALCITON, LATICACIDVEN in the last 168 hours.  Recent Results (from the past 240 hour(s))  Surgical pcr screen     Status: None   Collection Time: 04/27/17  9:18 PM  Result Value Ref Range Status   MRSA, PCR NEGATIVE NEGATIVE Final   Staphylococcus aureus NEGATIVE NEGATIVE Final    Comment:        The Xpert SA Assay (FDA approved for NASAL specimens in patients over 31 years of age), is one component of a comprehensive surveillance program.  Test performance has been validated by [facility] for patients greater than or equal to 3 year old. It is not intended to diagnose infection nor to guide or monitor treatment.   Culture, blood (Routine X 2) w Reflex to ID Panel     Status: None   Collection Time: 04/30/17 10:02 AM  Result Value Ref Range Status   Specimen Description BLOOD LEFT HAND  Final   Special Requests AEROBIC BOTTLE  ONLY Blood Culture adequate volume  Final   Culture   Final    NO GROWTH 5 DAYS Performed at [facility], [place] [address], [place], [place] [address]    Report Status 05/05/2017 FINAL  Final  Culture, blood (Routine X 2) w Reflex to ID Panel     Status: None   Collection Time: 04/30/17 10:02 AM  Result Value Ref Range Status   Specimen Description BLOOD LEFT HAND  Final   Special Requests AEROBIC BOTTLE ONLY Blood Culture adequate volume  Final   Culture   Final    NO GROWTH 5 DAYS Performed at [facility], [place] [address], [place], [place] [address]    Report Status 05/05/2017 FINAL  Final  Culture, Urine     Status: Abnormal   Collection Time: 04/30/17 12:14 PM  Result Value Ref Range Status   Specimen Description URINE, CATHETERIZED  Final   Special Requests NONE  Final   Culture (A)  Final    <10,000 COLONIES/mL INSIGNIFICANT GROWTH Performed at [facility], [place] [address] [place], [place] [address]    Report Status 05/01/2017 FINAL  Final         Radiology Studies: Dg Abd 1  View  Result Date: 05/05/2017 CLINICAL DATA:  Constipation. EXAM: ABDOMEN - 1 VIEW COMPARISON:  05/03/2017.  05/01/2017.  CT 04/25/2017. FINDINGS: Surgical can lead again noted in the pelvis. Persistent cecal distention. Cecum measures approximately 14 cm in diameter. Similar findings noted on prior study. No pneumatosis. Contrast previously noted in the colon is unchanged in position. Contrast os at the level of the left pelvis. No contrast noted in the rectum. Thoracolumbar spine degenerative changes both hips . IMPRESSION: Persistent cecal dilatation to 14 cm. Colonic contrast has not moved. Contrast is noted to the level of the sigmoid colon in unchanged position. Electronically Signed   By: Marcello Moores  Register   On: 05/05/2017 15:13        Scheduled Meds: . acetaminophen  325 mg Oral Q6H  . feeding supplement  1 Container Oral Q24H  . hydrocortisone   Rectal QID  . multivitamin  1 tablet Oral Daily  . pantoprazole (PROTONIX) IV  40 mg Intravenous Q12H  . senna-docusate  2 tablet Oral Daily  . sodium phosphate  1 enema Rectal Once   Continuous Infusions: . dextrose 5 % and 0.9 % NaCl with KCl 40 mEq/L 75 mL/hr at 05/07/17 0558  . heparin 500 Units/hr (05/07/17 0907)  . methocarbamol (ROBAXIN)  IV Stopped (05/01/17 1049)     LOS: 11 days    Time spent: 32 mins     Galilee Pierron Arsenio Loader, MD [facility] Pager [phone]   If 7PM-7AM, please contact night-coverage [email or web address] Password [email or web address] 05/07/2017, 10:54 AM

## 2017-05-07 NOTE — Progress Notes (Signed)
Daily Progress Note   Patient Name: Charlene [name]       Date: 05/07/2017 DOB: 1928-02-08  Age: 83 y.o. MRN#: [id number] Attending Physician: Damita Lack, MD Primary Care Physician: Shon Baton, MD Admit Date: 04/25/2017  Reason for Consultation/Follow-up: symptom management, following disease trajectory to help guide decision making  Subjective: Patient denies needs at this time.  Reports that she is feeling better today and thankful to be allowed soft diet.  See below:   Length of Stay: 11  Current Medications: Scheduled Meds:  . acetaminophen  325 mg Oral Q6H  . apixaban  2.5 mg Oral BID  . feeding supplement  1 Container Oral Q24H  . hydrocortisone   Rectal QID  . multivitamin  1 tablet Oral Daily  . pantoprazole (PROTONIX) IV  40 mg Intravenous Q12H  . senna-docusate  2 tablet Oral Daily  . sodium phosphate  1 enema Rectal Once    Continuous Infusions: . dextrose 5 % and 0.9 % NaCl with KCl 40 mEq/L 75 mL/hr at 05/07/17 0558  . methocarbamol (ROBAXIN)  IV Stopped (05/01/17 1049)    PRN Meds: docusate sodium, hydrALAZINE, HYDROmorphone (DILAUDID) injection, hydroxypropyl methylcellulose / hypromellose, methocarbamol (ROBAXIN)  IV, ondansetron (ZOFRAN) IV, ondansetron, opium-belladonna, oxyCODONE, polyethylene glycol, promethazine, simethicone, sodium phosphate  Physical Exam         NAD Thin elderly lady Clear S1 S2 Abdomen not distended but firm.  +BS, passing gas Colostomy bag in LUQ,   No edema Awake alert oriented. Sitting in chair  Vital Signs: BP (!) 148/54 (BP Location: Left Arm)   Pulse 60   Temp 98.7 F (37.1 C) (Oral)   Resp 18   Ht _0  (1.626 m)   Wt 58.5 kg (129 lb)   SpO2 98%   BMI 22.14 kg/m  SpO2: SpO2: 98 % O2 Device: O2 Device:  Nasal Cannula O2 Flow Rate: O2 Flow Rate (L/min): 1 L/min  Intake/output summary:   Intake/Output Summary (Last 24 hours) at 05/07/17 1323 Last data filed at 05/07/17 1254  Gross per 24 hour  Intake          2578.75 ml  Output             1320 ml  Net          1258.75 ml  LBM: Last BM Date: 05/06/17 Baseline Weight: Weight: 57.1 kg (125 lb 14.1 oz) Most recent weight: Weight: 58.5 kg (129 lb)       Palliative Assessment/Data: PPS 30%   Flowsheet Rows     Most Recent Value  Intake Tab  Referral Department  -- [ER]  Unit at Time of Referral  ER  Palliative Care Primary Diagnosis  Cancer  Date Notified  04/25/17  Palliative Care Type  New Palliative care  Reason for referral  Pain, Clarify Goals of Care  Date of Admission  04/25/17  Date first seen by Palliative Care  04/25/17  # of days Palliative referral response time  0 Day(s)  # of days IP prior to Palliative referral  0  Clinical Assessment  Palliative Performance Scale Score  50%  Pain Max last 24 hours  9  Pain Min Last 24 hours  4  Psychosocial & Spiritual Assessment  Palliative Care Outcomes  Patient/Family meeting held?  Yes  Who was at the meeting?  Patient, son, daughter in law      Patient Active Problem List   Diagnosis Date Noted  . Atelectasis   . Gross hematuria 04/30/2017  . Pyuria   . Deep vein thrombosis (DVT) of lower extremity ([place])   . Malignant neoplasm of sigmoid colon ([place])   . Hydronephrosis   . Encounter for palliative care   . Goals of care, counseling/discussion   . Large bowel obstruction ([place]) 04/26/2017  . Abdominal pain 04/25/2017  . UTI (urinary tract infection) 04/25/2017  . Renal insufficiency 04/25/2017  . Anemia 04/25/2017  . Hypertension 04/25/2017  . Pelvic abscess in female 02/02/2017  . Colovesical fistula with cancer s/p colectomy/repair 01/14/2017 01/18/2017  . 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 Protein-calorie  malnutrition, severe 02/01/2016  . Depression 01/31/2016  . GERD (gastroesophageal reflux disease) 01/31/2016  . Hemorrhoids 01/31/2016  . Acute diverticulitis 05/05/2015  . Abnormal LFTs 05/05/2015  . Dyslipidemia 03/13/2015  . Leukocytosis 03/13/2015  . Acute renal failure ([place]) 03/13/2015    Palliative Care Assessment & Plan   Patient Profile:    Assessment:  metastatic colon cancer Constipation Abdominal pain Large bowel obstruction  Recommendations/Plan:  s/p diverting colostomy     surgery following  I met with patient and her son.   We again reviewed goals, wishes and values. Reviewed plan for SNF rehab + following up with onc as outpatient.  Discussed again plan for palliative care to follow-up with her in the short term while at rehab.     Code Status:    Code Status Orders        Start     Ordered   04/25/17 2150  Do not attempt resuscitation (DNR)  Continuous    Question Answer Comment  In the event of cardiac or respiratory ARREST Do not call a "code blue"   In the event of cardiac or respiratory ARREST Do not perform Intubation, CPR, defibrillation or ACLS   In the event of cardiac or respiratory ARREST Use medication by any route, position, wound care, and other measures to relive pain and suffering. May use oxygen, suction and manual treatment of airway obstruction as needed for comfort.      04/25/17 2149    Code Status History    Date Active Date Inactive Code Status Order ID Comments User Context   02/03/2017  2:36 AM 02/07/2017  8:41 PM DNR [id number]  Nila Nephew, RN Inpatient  02/02/2017  7:22 PM 02/03/2017  2:36 AM Full Code [id number]  Armandina Gemma, MD ED   01/11/2017  9:26 AM 01/21/2017  5:14 PM Full Code [id number]  Leighton Ruff, MD Inpatient   01/31/2016  2:09 AM 02/02/2016  3:30 PM Full Code [id number]  Vianne Bulls, MD Inpatient   05/05/2015  9:12 PM 05/08/2015  1:15 PM Full Code [id number]  Reubin Milan, MD Inpatient   03/13/2015  5:20 PM  03/15/2015  5:34 PM Full Code [id number]  Robbie Lis, MD Inpatient       Prognosis:   guarded  Discharge Planning:  SNF rehab with palliative.  Case discussed with palliative team this AM in rounds and again on the floor.  Noted that she is [facility] patient and therefore should utilize [facility] through [place] for OP palliative services.  I placed new referral to care management.  I also discussed this with family who reports that they have no preference for OP palliative services and would be agreeable to whomever is contracted through [facility].   Care plan was discussed with  Patient, son Thank you for allowing the Palliative Medicine Team to assist in the care of this patient.   Time In: 1250 Time Out: 1310 Total Time 20 Prolonged Time Billed  no       Greater than 50%  of this time was spent counseling and coordinating care related to the above assessment and plan.  Micheline Rough, MD [phone]  Please contact Palliative Medicine Team phone at [phone] for questions and concerns.

## 2017-05-07 NOTE — Progress Notes (Addendum)
ANTICOAGULATION CONSULT NOTE    Pharmacy Consult for heparin Indication: hx DVT - bridge while off apixaban for surgery  No Known Allergies  Patient Measurements: Height: 5\' 4"  (162.6 cm) Weight: 129 lb (58.5 kg) IBW/kg (Calculated) : 54.7 Heparin Dosing Weight: 59kg  Vital Signs: Temp: 98.7 F (37.1 C) (08/03 0632) Temp Source: Oral (08/03 5208) BP: 148/54 (08/03 0223) Pulse Rate: 60 (08/03 0632)  Labs:  Recent Labs  05/05/17 0516 05/06/17 0436 05/07/17 0450  HGB 9.0* 8.7* 8.5*  HCT 26.6* 25.7* 24.9*  PLT 410* 388 387  HEPARINUNFRC 0.49 0.41 0.26*  CREATININE  --   --  0.90    Estimated Creatinine Clearance: 38.7 mL/min (by C-G formula based on SCr of 0.9 mg/dL).   Assessment: 30 YOF with a large bowel obstruction with a history of metastatic colon cancer.  She has a history of DVT on apixaban 2.5mg  BID (dose reduced due to bleeding, advanced age & decreased renal function).  Apixaban on hold since patient going to OR 7/25 (colostomy) and bridging with IV heparin.   Last dose of apixaban was 7/22 at 0930  Baseline aPTT = 41 sec,  HL = 1.46 (falsely elevated due to effects of apixaban),  INR = 1.37  Today, 05/07/2017:  Heparin level SUBtherapeutic at 0.26 (level trending down over the last 4 days)  No known issues with infusion, appears IV access lost 8/2 PM but infusion resumed at ~20:30, This morning's level should be at steady state.    CBC: Hgb decreased, pltc WNL  No obvious bleeding   Goal of Therapy:  Heparin level 0.3-0.7 (target low end of goal range per MD due to hematuria) Monitor platelets by anticoagulation protocol: Yes   Pan:  Increase heparin infusion to 500 units/hr.    Daily CBC and heparin level  Follow up plans for transition back to apixaban   Monitor for s/s bleeding   Doreene Eland, PharmD, BCPS.   Pager: [phone] 05/07/2017 7:10 AM

## 2017-05-08 DIAGNOSIS — N1330 Unspecified hydronephrosis: Secondary | ICD-10-CM | POA: Diagnosis not present

## 2017-05-08 DIAGNOSIS — K5649 Other impaction of intestine: Secondary | ICD-10-CM | POA: Diagnosis not present

## 2017-05-08 DIAGNOSIS — K5900 Constipation, unspecified: Secondary | ICD-10-CM | POA: Diagnosis not present

## 2017-05-08 DIAGNOSIS — R1084 Generalized abdominal pain: Secondary | ICD-10-CM | POA: Diagnosis not present

## 2017-05-08 DIAGNOSIS — Z933 Colostomy status: Secondary | ICD-10-CM | POA: Diagnosis not present

## 2017-05-08 DIAGNOSIS — C187 Malignant neoplasm of sigmoid colon: Secondary | ICD-10-CM | POA: Diagnosis not present

## 2017-05-08 DIAGNOSIS — I10 Essential (primary) hypertension: Secondary | ICD-10-CM | POA: Diagnosis not present

## 2017-05-08 DIAGNOSIS — M6281 Muscle weakness (generalized): Secondary | ICD-10-CM | POA: Diagnosis not present

## 2017-05-08 DIAGNOSIS — C787 Secondary malignant neoplasm of liver and intrahepatic bile duct: Secondary | ICD-10-CM | POA: Diagnosis not present

## 2017-05-08 DIAGNOSIS — Z48815 Encounter for surgical aftercare following surgery on the digestive system: Secondary | ICD-10-CM | POA: Diagnosis not present

## 2017-05-08 DIAGNOSIS — R21 Rash and other nonspecific skin eruption: Secondary | ICD-10-CM | POA: Diagnosis not present

## 2017-05-08 DIAGNOSIS — I82409 Acute embolism and thrombosis of unspecified deep veins of unspecified lower extremity: Secondary | ICD-10-CM | POA: Diagnosis not present

## 2017-05-08 DIAGNOSIS — R278 Other lack of coordination: Secondary | ICD-10-CM | POA: Diagnosis not present

## 2017-05-08 DIAGNOSIS — R310 Gross hematuria: Secondary | ICD-10-CM | POA: Diagnosis not present

## 2017-05-08 DIAGNOSIS — R531 Weakness: Secondary | ICD-10-CM | POA: Diagnosis not present

## 2017-05-08 DIAGNOSIS — R100 Acute abdomen: Secondary | ICD-10-CM | POA: Diagnosis not present

## 2017-05-08 DIAGNOSIS — M1611 Unilateral primary osteoarthritis, right hip: Secondary | ICD-10-CM | POA: Diagnosis not present

## 2017-05-08 DIAGNOSIS — N289 Disorder of kidney and ureter, unspecified: Secondary | ICD-10-CM | POA: Diagnosis not present

## 2017-05-08 LAB — CBC
HCT: 25.2 % — ABNORMAL LOW (ref 36.0–46.0)
Hemoglobin: 8.3 g/dL — ABNORMAL LOW (ref 12.0–15.0)
MCH: 26.4 pg (ref 26.0–34.0)
MCHC: 32.9 g/dL (ref 30.0–36.0)
MCV: 80.3 fL (ref 78.0–100.0)
PLATELETS: 410 10*3/uL — AB (ref 150–400)
RBC: 3.14 MIL/uL — ABNORMAL LOW (ref 3.87–5.11)
RDW: 18.1 % — AB (ref 11.5–15.5)
WBC: 12.2 10*3/uL — AB (ref 4.0–10.5)

## 2017-05-08 LAB — BASIC METABOLIC PANEL
Anion gap: 4 — ABNORMAL LOW (ref 5–15)
BUN: 7 mg/dL (ref 6–20)
CALCIUM: 7.5 mg/dL — AB (ref 8.9–10.3)
CO2: 21 mmol/L — AB (ref 22–32)
CREATININE: 0.94 mg/dL (ref 0.44–1.00)
Chloride: 109 mmol/L (ref 101–111)
GFR calc non Af Amer: 53 mL/min — ABNORMAL LOW (ref >60)
GLUCOSE: 111 mg/dL — AB (ref 65–99)
Potassium: 4.5 mmol/L (ref 3.5–5.1)
Sodium: 134 mmol/L — ABNORMAL LOW (ref 135–145)

## 2017-05-08 LAB — HEPARIN LEVEL (UNFRACTIONATED): Heparin Unfractionated: 1.98 IU/mL — ABNORMAL HIGH (ref 0.30–0.70)

## 2017-05-08 NOTE — Discharge Summary (Signed)
Physician Discharge Summary  Charlene [name] DQQ:[id number] DOB: [birth year] DOA: 04/25/2017  PCP: Shon Baton, MD  Admit date: 04/25/2017 Discharge date: 05/08/2017  Admitted From: Skilled nursing facility Dis[facility]  Recommendations for Outpatient Follow-up:  1. Follow up with PCP in 1-2 weeks 2. Follow up with Dr. Benay Spice in next 5-7 days. She has an appointment on 05/13/2017 3. General surgery arrange for outpatient appointment in 2 weeks    Discharge Condition: Stable CODE STATUS: Full code Diet recommendation: Regular as tolerated  Brief/Interim Summary: 83 year old female with past medical history of metastatic colon cancer to the liver came to the hospital with complaints of lower abdominal pain, constipation and nausea. CT of the abdomen and pelvis showed liver metastases with persistent left-sided hydronephrosis due to ureteral upstroke and and large bowel obstruction due to fecal matter and likely malignant mass. General surgery and urology were consulted. General surgery performed laparoscopic diverting loop colostomy and cystoscopy with left retrograde pyelogram and left ureteral stent placement. Noted to have complete left ureteral obstruction likely secondary to mass and there was difficulty cannulating left urethra. It was thought perhaps she can get a nephrostomy tube but ultimately was determined not to place one. During this time her Eliquiswas transitioned to heparin drip. Her abdomen remained distended during this time which was thought could be secondary to constipation therefore she was given enemas and also digital disimpaction without any significant results. Over the course of couple days oral diet was slowly started and her bowels opened up spontaneously. Her abdomen became soft and started having ostomy output. Her heparin drip was transitioned to oral anticoagulation, apparently due to history of GI bleed she is onky on Eliquis  2.5mg  bid. Given her comorbidities and long-term prognosis palliative care was consulted and it was determined that they will utilize outpatient palliative care services after the next follow-up with Dr. Benay Spice. Meanwhile she remains DO NOT RESUSCITATE. Today she has reached maximum benefit from in hospital stay. She is tolerating oral diet this morning and does not have any new complaints. She will be transferred to her skilled nursing facility for further care.  Discharge Diagnoses:  Principal Problem:   Abdominal pain Active Problems:   UTI (urinary tract infection)   Renal insufficiency   Anemia   Hypertension   Large bowel obstruction (HCC)   Malignant neoplasm of sigmoid colon (HCC)   Hydronephrosis   Encounter for palliative care   Goals of care, counseling/discussion   Pyuria   Deep vein thrombosis (DVT) of lower extremity ([place])   Gross hematuria   Atelectasis  Large bowel obstruction, improved Metastatic colon cancer to liver -Obstruction has resolved. She has good output from her ostomy bag and tolerating oral diet -Patient was seen by gastroenterology and surgery in the hospital -Supportive care and diet as tolerated  Acute kidney injury from left-sided hydronephrosis and obstruction -Acute kidney injury has resolved and creatinine appears to be stable -Patient was seen by urology and had a cystoscopy on 04/28/2017 which showed left-sided urethral obstruction likely secondary to malignant mass but they were unable to cannulate.  History of deep vein thrombosis -Continue anticoagulation. Eliquis 2.5mg  twice daily, low dose secondary to her history of GI bleed  Anemia of chronic disease -Hemoglobin appears to be stable  She needs to follow-up outpatient with Dr Learta Codding 8/9. At this point it appears she will only be getting palliative chemotherapy but overall long-term prognosis remains poor. Patient and family is aware of this.  Discharge Instructions   Allergies  as of 05/08/2017   No Known Allergies     Medication List    TAKE these medications   docusate sodium 100 MG capsule Commonly known as:  COLACE Take 100 mg by mouth daily as needed for mild constipation.   ELIQUIS 2.5 MG Tabs tablet Generic drug:  apixaban Take 2.5 mg by mouth 2 (two) times daily.   famotidine 10 MG chewable tablet Commonly known as:  PEPCID AC Chew 2 tablets (20 mg total) by mouth at bedtime as needed for heartburn. What changed:  how much to take   HYDROcodone-acetaminophen 5-325 MG tablet Commonly known as:  NORCO/VICODIN Take 1 tablet by mouth every 6 (six) hours as needed for moderate pain.   LUBRICATING EYE DROPS OP Apply 1 drop to eye daily as needed (dry eyes).   MULTIVITAMIN ADULT PO Take 1 tablet by mouth daily.   PRESERVISION AREDS 2 Caps Take 1 capsule by mouth daily.   ondansetron 4 MG tablet Commonly known as:  ZOFRAN Take 4 mg by mouth every 6 (six) hours as needed for nausea or vomiting.   protein supplement 6 g Powd Commonly known as:  RESOURCE BENEPROTEIN You can use whatever protein supplement that taste good to you.  Try to get 2-3 cans per day.  Do this till your ready to go home and take care of yourself.   sennosides-docusate sodium 8.6-50 MG tablet Commonly known as:  SENOKOT-S Take 2 tablets by mouth daily.   traMADol 50 MG tablet Commonly known as:  ULTRAM Take 1-2 tablets (50-100 mg total) by mouth every 6 (six) hours as needed for moderate pain or severe pain.       Contact information for follow-up providers    Leighton Ruff, MD Follow up.   Specialty:  General Surgery Why:  call for an appointment in 2 weeks. Contact information: [address] [place] [address] [phone]            Contact information for after-discharge care    Destination    [facility] Follow up.   Specialty:  [place] information: [address]  [place] [phone]                 No Known Allergies  On your next visit with your primary care physician please Get Medicines reviewed and adjusted.   Please request your Prim.MD to go over all Hospital Tests and Procedure/Radiological results at the follow up, please get all Hospital records sent to your Prim MD by signing hospital release before you go home.   If you experience worsening of your admission symptoms, develop shortness of breath, life threatening emergency, suicidal or homicidal thoughts you must seek medical attention immediately by calling 911 or calling your MD immediately  if symptoms less severe.  You Must read complete instructions/literature along with all the possible adverse reactions/side effects for all the Medicines you take and that have been prescribed to you. Take any new Medicines after you have completely understood and accpet all the possible adverse reactions/side effects.   Do not drive, operate heavy machinery, perform activities at heights, swimming or participation in water activities or provide baby sitting services if your were admitted for syncope or siezures until you have seen by Primary MD or a Neurologist and advised to do so again.  Do not drive when taking Pain medications.    Do not take more than prescribed  Pain, Sleep and Anxiety Medications  Special Instructions: If you have smoked or chewed Tobacco  in the last 2 yrs please stop smoking, stop any regular Alcohol  and or any Recreational drug use.  Wear Seat belts while driving.   Please note  You were cared for by a hospitalist during your hospital stay. If you have any questions about your discharge medications or the care you received while you were in the hospital after you are discharged, you can call the unit and asked to speak with the hospitalist on call if the hospitalist that took care of you is not available. Once you are discharged, your primary care  physician will handle any further medical issues. Please note that NO REFILLS for any discharge medications will be authorized once you are discharged, as it is imperative that you return to your primary care physician (or establish a relationship with a primary care physician if you do not have one) for your aftercare needs so that they can reassess your need for medications and monitor your lab values.   Increase activity slowly        Consultations:  General surgery  Urology  Palliative care services   Procedures/Studies: Ct Abdomen Pelvis Wo Contrast  Result Date: 04/25/2017 CLINICAL DATA:  Abdominal cramping over the last week. Emesis. Recent colon resection. EXAM: CT ABDOMEN AND PELVIS WITHOUT CONTRAST TECHNIQUE: Multidetector CT imaging of the abdomen and pelvis was performed following the standard protocol without IV contrast. COMPARISON:  04/19/2017 FINDINGS: Lower chest: No acute finding.  Hiatal hernia. Hepatobiliary: Small metastatic lesions within the liver demonstrated on the previous contrast-enhanced study cannot be visualized on today's noncontrast exam. These would presumably be unchanged. Small gallstones again visible dependent within the gallbladder. Small stone in the distal common bile duct. This could be symptomatic. Pancreas: Negative Spleen: Negative Adrenals/Urinary Tract: Adrenal glands remain normal. Right kidney contains of small cyst in the lower pole. Small nonobstructing stone in the lower pole. No acute renal finding on the right. Left kidney again shows an upper pole cyst in shows hydroureteronephrosis with the ureter being dilated all the way to the pelvis. There is re- roll obstruction in the pelvis due to recurrent tumor. No primary bladder lesion is seen. Stomach/Bowel: There is recurrent tumor at the sigmoid colon anastomosis site which is locally extensive. This causes some degree of obstruction as well with relative constipation proximal to that.  Vascular/Lymphatic: Aortic atherosclerosis is again demonstrated. Major veins are patent. Left-sided retroperitoneal adenopathy with low-density/necrosis as seen previously. Index node image 42 again measures 2.2 x 2.0 cm. Reproductive: No primary finding. Other: No free air. Musculoskeletal: Chronic spinal degenerative changes. IMPRESSION: Chololithiasis. Small stone also in the common bile duct. This could possibly be symptomatic. Small liver metastases previously demonstrated cannot be seen without contrast. No change would be expected over the last 6 days. Persistent left hydronephrosis due to ureteral obstruction in the left pelvic retroperitoneum where there is extensive recurrent tumor at the sigmoid anastomosis with local extension. Some relative large bowel obstruction with a large amount of fecal matter proximal to this region. Metastatic retroperitoneal lymphadenopathy unchanged since the study of 6 days ago. Electronically Signed   By: Nelson Chimes M.D.   On: 04/25/2017 19:31   Dg Chest 2 View  Result Date: 04/30/2017 CLINICAL DATA:  Leukocytosis. EXAM: CHEST  2 VIEW COMPARISON:  CT the abdomen and pelvis 04/19/2017 FINDINGS: The heart is mildly enlarged. Atherosclerotic changes are present at the aortic arch. Left greater  than right pleural effusions and basilar airspace disease is present. The upper lung fields are clear. The lungs are mildly hyperinflated. Mild pulmonary vascular congestion is present. The visualized soft tissues and bony thorax are unremarkable. IMPRESSION: 1. Left greater than right pleural effusion and airspace disease. While this may reflect atelectasis, infection is not excluded. 2. Cardiomegaly and mild pulmonary vascular congestion. Electronically Signed   By: San Morelle M.D.   On: 04/30/2017 08:41   Dg Abd 1 View  Result Date: 05/05/2017 CLINICAL DATA:  Constipation. EXAM: ABDOMEN - 1 VIEW COMPARISON:  05/03/2017.  05/01/2017.  CT 04/25/2017. FINDINGS:  Surgical can lead again noted in the pelvis. Persistent cecal distention. Cecum measures approximately 14 cm in diameter. Similar findings noted on prior study. No pneumatosis. Contrast previously noted in the colon is unchanged in position. Contrast os at the level of the left pelvis. No contrast noted in the rectum. Thoracolumbar spine degenerative changes both hips . IMPRESSION: Persistent cecal dilatation to 14 cm. Colonic contrast has not moved. Contrast is noted to the level of the sigmoid colon in unchanged position. Electronically Signed   By: Marcello Moores  Register   On: 05/05/2017 15:13   Dg Abd 1 View  Result Date: 05/03/2017 CLINICAL DATA:  Severe abdominal pain.  Colon cancer. EXAM: ABDOMEN - 1 VIEW COMPARISON:  Two days prior FINDINGS: There is unchanged bowel gas pattern. Cecum is preferentially distended, measuring up to 14 cm. There is gradual narrowing of the colon to the level of the sigmoid, beyond which there is no visible oral contrast. Oral contrast has not progressed since prior. No noted small bowel distention. Retroperitoneal clips in the pelvis. No supine evidence of pneumoperitoneum. Well visualized wall of the right colon is attributed to intraluminal contrast. IMPRESSION: 1. Unchanged bowel gas pattern compared to 2 days prior. 2. No interval progression of oral contrast beyond the sigmoid colon where there is postoperative changes and tumor by preceding CT. 3. Despite distal stenosis, the left colon is comparatively decompressed and the cecum is preferentially distended to 14 cm diameter. Electronically Signed   By: Monte Fantasia M.D.   On: 05/03/2017 13:47   Dg Abd 1 View  Result Date: 05/01/2017 CLINICAL DATA:  Patient with lower abdominal pain. Prior colonoscopy. Prior ileal loop diversion. EXAM: ABDOMEN - 1 VIEW COMPARISON:  CT abdomen pelvis 04/25/2017 FINDINGS: Prominent gas cysts distended loop of bowel within the right lower quadrant, potentially represents gaseous  distended colon. Oral contrast material within the bowel. Prominent loops of small bowel within the central abdomen. Supine evaluation limited for the detection of free intraperitoneal air. Pelvic surgical clips. Lumbar spine degenerative changes. IMPRESSION: Gaseous distended loop of bowel within the right lower quadrant favored to represent cecum. Mildly gaseous distended loops of small bowel. Overall findings may represent ileus. Electronically Signed   By: Lovey Newcomer M.D.   On: 05/01/2017 15:54   Ct Chest W Contrast  Result Date: 04/19/2017 CLINICAL DATA:  Sigmoid colon cancer stage IIIB status post sigmoid colectomy. EXAM: CT CHEST, ABDOMEN, AND PELVIS WITH CONTRAST TECHNIQUE: Multidetector CT imaging of the chest, abdomen and pelvis was performed following the standard protocol during bolus administration of intravenous contrast. CONTRAST:  56mL ISOVUE-300 IOPAMIDOL (ISOVUE-300) INJECTION 61% COMPARISON:  CT scan 02/11/2017 FINDINGS: CT CHEST FINDINGS Cardiovascular: The heart is normal in size. Small amount of pericardial fluid which is new. The aorta is normal in caliber. Stable atherosclerotic calcifications. No dissection. The branch vessels are patent. Stable coronary artery calcifications. Mediastinum/Nodes:  Small scattered mediastinal and hilar lymph nodes but no mass or overt adenopathy. The esophagus is grossly normal. Lungs/Pleura: Advanced emphysematous changes and pulmonary scarring. No worrisome pulmonary lesions to suggest metastatic disease. Chest wall/ Musculoskeletal: No breast masses, supraclavicular or axillary lymphadenopathy. Small scattered lymph nodes are noted. The thyroid gland is grossly normal. A few small nodules are noted. No significant bony findings. No findings to suggest metastatic disease. CT ABDOMEN PELVIS FINDINGS Hepatobiliary: New low-attenuation liver lesions are worrisome for new metastatic disease. The largest lesion is near the caudate lobe on image number 55 and  measures 9.5 mm. No intrahepatic biliary dilatation. The gallbladder is normal. No common bile duct dilatation. Pancreas: No mass, inflammation or ductal dilatation. Spleen: Normal size.  No focal lesions. Adrenals/Urinary Tract: The adrenal glands are normal. Stable right renal cyst. New hydronephrosis of the left kidney with decreased perfusion and left-sided hydroureter due to recurrent tumor in the pelvis. The bladder is grossly normal. Stomach/Bowel: The stomach, duodenum, small bowel and colon are grossly normal except for recurrent tumor at the sigmoid colon anastomosis site with fairly significant colonic narrowing and upstream constipation. Vascular/Lymphatic: Stable advanced atherosclerotic calcifications involving the aorta and branch vessels. The major venous structures are patent. New left-sided retroperitoneal lymphadenopathy. There is a somewhat necrotic appearing 2.2 x 2.0 cm lymph node on image number 83 adjacent to the left common iliac artery along with smaller adjacent lymph nodes. New line new soft tissue mass in the sigmoid mesocolon on image number 94 measuring 3.7 x 3.3 cm. This is likely obstructing the left ureter. More inferiorly there is a second mass which appears to involve the uterus with thick irregular soft tissue developing the uterus. Cannot exclude tumor surrounding small bowel loops in the right pelvis on image number 101. Reproductive: Small uterus or uterine remnant appears surrounded by tumor. Other: No inguinal mass or adenopathy. Musculoskeletal: No significant bony findings. IMPRESSION: 1. New metastatic hepatic lesions, retroperitoneal lymphadenopathy and fairly extensive recurrent tumor in the pelvis which is obstructing the left ureter and causing significant narrowing of the sigmoid colon at the anastomosis site. 2. Suspect infiltrating tumor crossing the midline and involving the right lower pelvic small bowel loops. 3. No findings for metastatic disease involving the  chest. 4. Small pericardial effusion. 5. Advanced atherosclerotic calcifications involving the thoracic and abdominal aorta and branch vessels. Electronically Signed   By: Marijo Sanes M.D.   On: 04/19/2017 15:55   Ct Abdomen Pelvis W Contrast  Result Date: 04/19/2017 CLINICAL DATA:  Sigmoid colon cancer stage IIIB status post sigmoid colectomy. EXAM: CT CHEST, ABDOMEN, AND PELVIS WITH CONTRAST TECHNIQUE: Multidetector CT imaging of the chest, abdomen and pelvis was performed following the standard protocol during bolus administration of intravenous contrast. CONTRAST:  40mL ISOVUE-300 IOPAMIDOL (ISOVUE-300) INJECTION 61% COMPARISON:  CT scan 02/11/2017 FINDINGS: CT CHEST FINDINGS Cardiovascular: The heart is normal in size. Small amount of pericardial fluid which is new. The aorta is normal in caliber. Stable atherosclerotic calcifications. No dissection. The branch vessels are patent. Stable coronary artery calcifications. Mediastinum/Nodes: Small scattered mediastinal and hilar lymph nodes but no mass or overt adenopathy. The esophagus is grossly normal. Lungs/Pleura: Advanced emphysematous changes and pulmonary scarring. No worrisome pulmonary lesions to suggest metastatic disease. Chest wall/ Musculoskeletal: No breast masses, supraclavicular or axillary lymphadenopathy. Small scattered lymph nodes are noted. The thyroid gland is grossly normal. A few small nodules are noted. No significant bony findings. No findings to suggest metastatic disease. CT ABDOMEN PELVIS FINDINGS Hepatobiliary:  New low-attenuation liver lesions are worrisome for new metastatic disease. The largest lesion is near the caudate lobe on image number 55 and measures 9.5 mm. No intrahepatic biliary dilatation. The gallbladder is normal. No common bile duct dilatation. Pancreas: No mass, inflammation or ductal dilatation. Spleen: Normal size.  No focal lesions. Adrenals/Urinary Tract: The adrenal glands are normal. Stable right renal  cyst. New hydronephrosis of the left kidney with decreased perfusion and left-sided hydroureter due to recurrent tumor in the pelvis. The bladder is grossly normal. Stomach/Bowel: The stomach, duodenum, small bowel and colon are grossly normal except for recurrent tumor at the sigmoid colon anastomosis site with fairly significant colonic narrowing and upstream constipation. Vascular/Lymphatic: Stable advanced atherosclerotic calcifications involving the aorta and branch vessels. The major venous structures are patent. New left-sided retroperitoneal lymphadenopathy. There is a somewhat necrotic appearing 2.2 x 2.0 cm lymph node on image number 83 adjacent to the left common iliac artery along with smaller adjacent lymph nodes. New line new soft tissue mass in the sigmoid mesocolon on image number 94 measuring 3.7 x 3.3 cm. This is likely obstructing the left ureter. More inferiorly there is a second mass which appears to involve the uterus with thick irregular soft tissue developing the uterus. Cannot exclude tumor surrounding small bowel loops in the right pelvis on image number 101. Reproductive: Small uterus or uterine remnant appears surrounded by tumor. Other: No inguinal mass or adenopathy. Musculoskeletal: No significant bony findings. IMPRESSION: 1. New metastatic hepatic lesions, retroperitoneal lymphadenopathy and fairly extensive recurrent tumor in the pelvis which is obstructing the left ureter and causing significant narrowing of the sigmoid colon at the anastomosis site. 2. Suspect infiltrating tumor crossing the midline and involving the right lower pelvic small bowel loops. 3. No findings for metastatic disease involving the chest. 4. Small pericardial effusion. 5. Advanced atherosclerotic calcifications involving the thoracic and abdominal aorta and branch vessels. Electronically Signed   By: Marijo Sanes M.D.   On: 04/19/2017 15:55   Dg C-arm 1-60 Min-no Report  Result Date:  04/28/2017 Fluoroscopy was utilized by the requesting physician.  No radiographic interpretation.       Subjective:   Discharge Exam: Vitals:   05/08/17 0555 05/08/17 0642  BP: (!) 177/70 (!) 135/45  Pulse: 69   Resp: 18   Temp: 98.2 F (36.8 C)    Vitals:   05/07/17 1437 05/07/17 2107 05/08/17 0555 05/08/17 0642  BP: (!) 161/57 (!) 152/63 (!) 177/70 (!) 135/45  Pulse: (!) 59 (!) 59 69   Resp: 19 18 18    Temp: 98.2 F (36.8 C) 98.7 F (37.1 C) 98.2 F (36.8 C)   TempSrc: Oral Oral Oral   SpO2: 98% 96% 96%   Weight:      Height:        General: Pt is alert, awake, not in acute distress Cardiovascular: RRR, S1/S2 +, no rubs, no gallops Respiratory: CTA bilaterally, no wheezing, no rhonchi Abdominal: Soft, NT, ND, bowel sounds +; ostomy bag in place. Extremities: no edema, no cyanosis    The results of significant diagnostics from this hospitalization (including imaging, microbiology, ancillary and laboratory) are listed below for reference.     Microbiology: Recent Results (from the past 240 hour(s))  Culture, blood (Routine X 2) w Reflex to ID Panel     Status: None   Collection Time: 04/30/17 10:02 AM  Result Value Ref Range Status   Specimen Description BLOOD LEFT HAND  Final   Special Requests AEROBIC  BOTTLE ONLY Blood Culture adequate volume  Final   Culture   Final    NO GROWTH 5 DAYS Performed at [facility], [place] [address] [place], [place] [address]    Report Status 05/05/2017 FINAL  Final  Culture, blood (Routine X 2) w Reflex to ID Panel     Status: None   Collection Time: 04/30/17 10:02 AM  Result Value Ref Range Status   Specimen Description BLOOD LEFT HAND  Final   Special Requests AEROBIC BOTTLE ONLY Blood Culture adequate volume  Final   Culture   Final    NO GROWTH 5 DAYS Performed at [facility], [place] [address] [place],  [address]    Report Status 05/05/2017 FINAL  Final  Culture, Urine     Status: Abnormal    Collection Time: 04/30/17 12:14 PM  Result Value Ref Range Status   Specimen Description URINE, CATHETERIZED  Final   Special Requests NONE  Final   Culture (A)  Final    <10,000 COLONIES/mL INSIGNIFICANT GROWTH Performed at [facility], [place] [address] [place],  [address]    Report Status 05/01/2017 FINAL  Final     Labs: BNP (last 3 results) No results for input(s): BNP in the last 8760 hours. Basic Metabolic Panel:  Recent Labs Lab 05/02/17 0121 05/03/17 0456 05/04/17 0413 05/07/17 0450 05/08/17 0511  NA 132* 131* 132* 133* 134*  K 3.5 3.6 3.5 4.0 4.5  CL 103 102 102 107 109  CO2 20* 21* 23 22 21*  GLUCOSE 86 93 74 108* 111*  BUN 14 15 17 7 7   CREATININE 0.99 1.00 1.04* 0.90 0.94  CALCIUM 7.9* 7.9* 7.9* 7.3* 7.5*   Liver Function Tests: No results for input(s): AST, ALT, ALKPHOS, BILITOT, PROT, ALBUMIN in the last 168 hours. No results for input(s): LIPASE, AMYLASE in the last 168 hours. No results for input(s): AMMONIA in the last 168 hours. CBC:  Recent Labs Lab 05/04/17 0413 05/05/17 0516 05/06/17 0436 05/07/17 0450 05/08/17 0511  WBC 13.0* 12.4* 12.0* 12.3* 12.2*  HGB 8.8* 9.0* 8.7* 8.5* 8.3*  HCT 26.6* 26.6* 25.7* 24.9* 25.2*  MCV 79.4 79.2 79.3 78.8 80.3  PLT 395 410* 388 387 410*   Cardiac Enzymes: No results for input(s): CKTOTAL, CKMB, CKMBINDEX, TROPONINI in the last 168 hours. BNP: Invalid input(s): POCBNP CBG: No results for input(s): GLUCAP in the last 168 hours. D-Dimer No results for input(s): DDIMER in the last 72 hours. Hgb A1c No results for input(s): HGBA1C in the last 72 hours. Lipid Profile No results for input(s): CHOL, HDL, LDLCALC, TRIG, CHOLHDL, LDLDIRECT in the last 72 hours. Thyroid function studies No results for input(s): TSH, T4TOTAL, T3FREE, THYROIDAB in the last 72 hours.  Invalid input(s): FREET3 Anemia work up No results for input(s): VITAMINB12, FOLATE, FERRITIN, TIBC, IRON, RETICCTPCT in the last  72 hours. Urinalysis    Component Value Date/Time   COLORURINE YELLOW 04/30/2017 1214   APPEARANCEUR HAZY (A) 04/30/2017 1214   LABSPEC 1.008 04/30/2017 1214   PHURINE 5.0 04/30/2017 1214   GLUCOSEU NEGATIVE 04/30/2017 1214   HGBUR LARGE (A) 04/30/2017 1214   BILIRUBINUR NEGATIVE 04/30/2017 1214   KETONESUR NEGATIVE 04/30/2017 1214   PROTEINUR 100 (A) 04/30/2017 1214   UROBILINOGEN 0.2 05/05/2015 1840   NITRITE NEGATIVE 04/30/2017 1214   LEUKOCYTESUR NEGATIVE 04/30/2017 1214   Sepsis Labs Invalid input(s): PROCALCITONIN,  WBC,  LACTICIDVEN Microbiology Recent Results (from the past 240 hour(s))  Culture,  blood (Routine X 2) w Reflex to ID Panel     Status: None   Collection Time: 04/30/17 10:02 AM  Result Value Ref Range Status   Specimen Description BLOOD LEFT HAND  Final   Special Requests AEROBIC BOTTLE ONLY Blood Culture adequate volume  Final   Culture   Final    NO GROWTH 5 DAYS Performed at [facility], [place] [address] [place], [place] [address]    Report Status 05/05/2017 FINAL  Final  Culture, blood (Routine X 2) w Reflex to ID Panel     Status: None   Collection Time: 04/30/17 10:02 AM  Result Value Ref Range Status   Specimen Description BLOOD LEFT HAND  Final   Special Requests AEROBIC BOTTLE ONLY Blood Culture adequate volume  Final   Culture   Final    NO GROWTH 5 DAYS Performed at [facility], [place] [address] [place] [address], [place], [place] [address]    Report Status 05/05/2017 FINAL  Final  Culture, Urine     Status: Abnormal   Collection Time: 04/30/17 12:14 PM  Result Value Ref Range Status   Specimen Description URINE, CATHETERIZED  Final   Special Requests NONE  Final   Culture (A)  Final    <10,000 COLONIES/mL INSIGNIFICANT GROWTH Performed at [facility], [place] [address] [place], [place] [address]    Report Status 05/01/2017 FINAL  Final     Time coordinating discharge: Over 30 minutes  SIGNED:   Damita Lack, MD  [facility] 05/08/2017, 10:06 AM Pager   If 7PM-7AM, please contact night-coverage [email or web address] Password [email or web address]

## 2017-05-08 NOTE — Clinical Social Work Placement (Addendum)
Pt ready for discharge today and will go to [facility]. Pt and son-[name] (via phone call) aware and agreeable to discharge plan. CSW confirmed bed with Claiborne Billings at [facility] and sent clinicals through the [place]. CSW updated RN Raquel Sarna. RN to call report to RN supervisor at [phone] for room 609B. RN aware to send pt with 1-2 ostomy bags for SNF. CSW arranged transportation with [facility]. Signed DNR on chart. CSW signing off as no further Social Work needs identified.   CLINICAL SOCIAL WORK PLACEMENT  NOTE  Date:  05/08/2017  Patient Details  Name: Charlene [name] MRN: [id number] Date of Birth: 1927-11-28  Clinical Social Work is seeking post-discharge placement for this patient at the [place] level of care (*CSW will initial, date and re-position this form in  chart as items are completed):  Yes   Patient/family provided with [place] Work Department's list of facilities offering this level of care within the geographic area requested by the patient (or if unable, by the patient's family).  Yes   Patient/family informed of their freedom to choose among providers that offer the needed level of care, that participate in Medicare, Medicaid or managed care program needed by the patient, have an available bed and are willing to accept the patient.  Yes   Patient/family informed of [facility]'s ownership interest in [facility] and [facility], as well as of the fact that they are under no obligation to receive care at these facilities.  PASRR submitted to EDS on 04/29/17     PASRR number received on 04/29/17     Existing PASRR number confirmed on       FL2 transmitted to all facilities in geographic area requested by pt/family on 04/29/17     FL2 transmitted to all facilities within larger geographic area on       Patient informed that his/her managed care company has contracts with or will negotiate with certain facilities, including the  following:        Yes   Patient/family informed of bed offers received.  Patient chooses bed at [facility]     Physician recommends and patient chooses bed at      Patient to be transferred to [facility] on 05/08/17.  Patient to be transferred to facility by [facility]     Patient family notified on 05/08/17 of transfer.  Name of family member notified:  [name] [phone]     PHYSICIAN Please prepare prescriptions, Please prepare priority discharge summary, including medications     Additional Comment:    _______________________________________________ Truitt Merle, LCSW 05/08/2017, 10:47 AM

## 2017-05-11 ENCOUNTER — Telehealth: Payer: Self-pay | Admitting: Nurse Practitioner

## 2017-05-11 NOTE — Telephone Encounter (Signed)
Patient's son called and wanted change appointment due to a $50 sur charge for appointments after 3pm she is now schedule for tomorrow 8/8 at 2:15

## 2017-05-12 ENCOUNTER — Ambulatory Visit: Payer: Medicare Other | Admitting: Oncology

## 2017-05-12 ENCOUNTER — Ambulatory Visit: Payer: Medicare Other | Admitting: Nurse Practitioner

## 2017-05-13 ENCOUNTER — Encounter: Payer: Self-pay | Admitting: Oncology

## 2017-05-13 ENCOUNTER — Telehealth: Payer: Self-pay | Admitting: Oncology

## 2017-05-13 ENCOUNTER — Ambulatory Visit: Payer: Medicare Other | Admitting: Nurse Practitioner

## 2017-05-13 ENCOUNTER — Ambulatory Visit (HOSPITAL_BASED_OUTPATIENT_CLINIC_OR_DEPARTMENT_OTHER): Payer: Medicare Other | Admitting: Oncology

## 2017-05-13 ENCOUNTER — Telehealth: Payer: Self-pay | Admitting: *Deleted

## 2017-05-13 VITALS — BP 187/68 | HR 64 | Temp 99.0°F | Resp 17 | Ht 64.0 in | Wt 147.8 lb

## 2017-05-13 DIAGNOSIS — C787 Secondary malignant neoplasm of liver and intrahepatic bile duct: Secondary | ICD-10-CM

## 2017-05-13 DIAGNOSIS — C187 Malignant neoplasm of sigmoid colon: Secondary | ICD-10-CM

## 2017-05-13 DIAGNOSIS — C19 Malignant neoplasm of rectosigmoid junction: Secondary | ICD-10-CM

## 2017-05-13 NOTE — Telephone Encounter (Signed)
[facility] to advise that Dr. Benay Spice would be the attending MD. Damaris Schooner to [place]. She states that pt will continue with Palliative Care services as she in a SNF and needs to complete her medicare days. Dr. Gearldine Shown nurse Lavella Lemons made aware of this.

## 2017-05-13 NOTE — Telephone Encounter (Signed)
Scheduled appt per 8/9 los - Gave patient AVS and calender per los.  

## 2017-05-13 NOTE — Progress Notes (Signed)
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    CURRENT THERAPY: none  INTERVAL HISTORY: LATIQUA DALOIA 83 y.o. female returns for follow-up post hospitalization. Her son accompanies her to the visit today. The patient was discharged from the hospital on 05/08/2017. She is currently at a skilled nursing facility for rehabilitation. The patient was admitted to the hospital with lower abdominal pain, constipation, and nausea. The patient was found to have liver metastases with persistent left-sided hydronephrosis due to ureteral obstruction and large bowel obstruction and fecal matter. Gen. Surgery performed a laparoscopic diverting loop colostomy and cystoscopy with left retrograde pyelogram and left ureteral stent placement. There was difficulty cannulating the left urethra and the stent was not placed.  The patient remains very weak. She is trying to work with physical therapy to get stronger. For example, she reports that her leg fell off the bed last evening and she was not strong enough to bring her leg back up onto the bed. She had staff help her with this.she denies fevers and chills. She reports some abdominal discomfort with nausea and intermittent vomiting. He uses hydrocodone for pain which is effective. She is also on a fentanyl patch. Staff at the skilled facility have been changing her colostomy. They have been having issues with it leaking. She has a Foley catheter in place. Reports lower extremity edema. She is here to discuss possible treatment options for her colon cancer.   Patient Active Problem List   Diagnosis Date Noted  .  Atelectasis   . Gross hematuria 04/30/2017  . Pyuria   . Deep vein thrombosis (DVT) of lower extremity ([place])   . Malignant neoplasm of sigmoid colon ([place])   . Hydronephrosis   . Encounter for palliative care   . Goals of care, counseling/discussion   . Large bowel obstruction ([place]) 04/26/2017  . Abdominal pain 04/25/2017  . UTI (urinary tract infection) 04/25/2017  . Renal insufficiency 04/25/2017  . Anemia 04/25/2017  . Hypertension 04/25/2017  . Pelvic abscess in female 02/02/2017  . Colovesical fistula with cancer s/p colectomy/repair 01/14/2017 01/18/2017  . 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 Protein-calorie malnutrition, severe 02/01/2016  . Depression 01/31/2016  . GERD (gastroesophageal reflux disease) 01/31/2016  . Hemorrhoids 01/31/2016  . Acute diverticulitis 05/05/2015  . Abnormal LFTs 05/05/2015  . Dyslipidemia 03/13/2015  . Leukocytosis 03/13/2015  . Acute renal failure ([place]) 03/13/2015    has No Known Allergies.  MEDICAL HISTORY: Past Medical History:  Diagnosis Date  . Acute renal failure ([place])   . Allergy    seasonal  . BPPV (benign paroxysmal positional vertigo) 2012  . Colon cancer metastasized to liver ([place])   . Depression   . Diverticulitis   . Dyslipidemia   . GERD (gastroesophageal reflux disease)   . Hemorrhoids   . Hypertension 04/25/2017  . Macular degeneration   . Renal insufficiency 04/25/2017  . Urine frequency   . Uterine carcinoma ([place])     SURGICAL HISTORY: Past Surgical History:  Procedure Laterality Date  . ABDOMINAL HYSTERECTOMY    . COLECTOMY N/A 01/14/2017   Procedure: OPEN SIGMOIDECTOMY WITH REPAIR  OF COLOVESICULAR FISTULA;  Surgeon: Leighton Ruff, MD;  Location: [facility];  Service: General;  Laterality: N/A;  . CYSTOSCOPY W/ URETERAL STENT PLACEMENT Left 04/28/2017   Procedure: CYSTOSCOPY WITH LEFT RETROGRADE PYELOGRAM/ LEFTURETERAL STENT PLACEMENT;  Surgeon: Raynelle Bring, MD;   Location: [facility];  Service: Urology;  Laterality: Left;  MATURED THE STOMA  . EYE SURGERY     cataract removal bilateral 2012  . FLEXIBLE SIGMOIDOSCOPY N/A 01/11/2017   Procedure: FLEXIBLE SIGMOIDOSCOPY;  Surgeon: Leighton Ruff, MD;  Location: [facility];  Service: Endoscopy;  Laterality: N/A;  . ILEO LOOP DIVERSION N/A 04/28/2017   Procedure: LAPAROSCOPIC ASSISTED DIVERTING  LOOP COLOSTOMY;  Surgeon: Coralie Keens, MD;  Location: [facility];  Service: General;  Laterality: N/A;  . IR RADIOLOGIST EVAL & MGMT  02/11/2017  . TONSILLECTOMY      SOCIAL HISTORY: Social History   Social History  . Marital status: Widowed    Spouse name: N/A  . Number of children: 2  . Years of education: N/A   Occupational History  . retired     Social History Main Topics  . Smoking status: Former Smoker    Quit date: 01/19/1973  . Smokeless tobacco: Never Used  . Alcohol use Yes     Comment: occ  . Drug use: No  . Sexual activity: Not on file   Other Topics Concern  . Not on file   Social History Narrative  . No narrative on file    FAMILY HISTORY: Family History  Problem Relation Age of Onset  . Heart disease Mother   . Diabetes Mother   . Stroke Father   . Diabetes Father   . Cancer Brother        lung cancer  . Heart disease Brother     Review of Systems  Constitutional: Positive for fatigue and unexpected weight change. Negative for chills, diaphoresis and fever.  HENT:  Negative.   Eyes: Negative.   Respiratory: Negative.   Cardiovascular: Negative.   Gastrointestinal: Positive for abdominal pain, nausea and vomiting. Negative for blood in stool, constipation and diarrhea.  Endocrine: Negative.   Genitourinary:        She has a Foley catheter  Musculoskeletal: Negative.   Skin: Negative.   Neurological: Negative.   Hematological: Negative.   Psychiatric/Behavioral: Negative.       PHYSICAL EXAMINATION  ECOG PERFORMANCE STATUS: 2 - Symptomatic, <50% confined to  bed  Vitals:   05/13/17 1140  BP: (!) 187/68  Pulse: 64  Resp: 17  Temp: 99 F (37.2 C)  SpO2: 97%    Physical Exam  Constitutional: She is oriented to person, place, and time.  Chronically ill-appearing female who is in no acute distress.  HENT:  Head: Normocephalic.  Mouth/Throat: Oropharynx is clear and moist. No oropharyngeal exudate.  Eyes: Conjunctivae are normal. No scleral icterus.  Neck: Normal range of motion. Neck supple.  Cardiovascular: Normal rate, regular rhythm, normal heart sounds and intact distal pulses.   Pulmonary/Chest: Effort normal and breath sounds normal. No respiratory distress. She has no wheezes. She exhibits no tenderness.  Abdominal: Soft. Bowel sounds are normal.  Colostomy in place in her left abdomen. No redness or drainage noted.  Musculoskeletal: Normal range of motion. She exhibits edema. She exhibits no tenderness.  Bilateral lower extremity edema  Lymphadenopathy:    She has no cervical adenopathy.  Neurological: She is alert and oriented to person, place, and time. She exhibits normal muscle tone. Coordination normal.  Skin:  Skin is warm and dry. No rash noted. No erythema. No pallor.  Psychiatric: Mood, memory, affect and judgment normal.  Vitals reviewed.   LABORATORY DATA:  CBC    Component Value Date/Time   WBC 12.2 (H) 05/08/2017 0511   RBC 3.14 (L) 05/08/2017 0511   HGB 8.3 (L) 05/08/2017 0511   HGB 10.7 (L) 04/13/2017 1201   HCT 25.2 (L) 05/08/2017 0511   HCT 32.8 (L) 04/13/2017 1201   PLT 410 (H) 05/08/2017 0511   PLT 363 04/13/2017 1201   MCV 80.3 05/08/2017 0511   MCV 81.1 04/13/2017 1201   MCH 26.4 05/08/2017 0511   MCHC 32.9 05/08/2017 0511   RDW 18.1 (H) 05/08/2017 0511   RDW 18.2 (H) 04/13/2017 1201   LYMPHSABS 0.5 (L) 04/29/2017 0456   LYMPHSABS 1.5 04/13/2017 1201   MONOABS 0.2 04/29/2017 0456   MONOABS 0.7 04/13/2017 1201   EOSABS 0.0 04/29/2017 0456   EOSABS 0.4 04/13/2017 1201   BASOSABS 0.0  04/29/2017 0456   BASOSABS 0.1 04/13/2017 1201    CMP     Component Value Date/Time   NA 134 (L) 05/08/2017 0511   NA 136 04/19/2017 1044   K 4.5 05/08/2017 0511   K 4.5 04/19/2017 1044   CL 109 05/08/2017 0511   CO2 21 (L) 05/08/2017 0511   CO2 23 04/19/2017 1044   GLUCOSE 111 (H) 05/08/2017 0511   GLUCOSE 96 04/19/2017 1044   BUN 7 05/08/2017 0511   BUN 19.9 04/19/2017 1044   CREATININE 0.94 05/08/2017 0511   CREATININE 1.4 (H) 04/19/2017 1044   CALCIUM 7.5 (L) 05/08/2017 0511   CALCIUM 9.7 04/19/2017 1044   PROT 6.3 (L) 04/26/2017 0443   ALBUMIN 3.1 (L) 04/26/2017 0443   AST 21 04/26/2017 0443   ALT 13 (L) 04/26/2017 0443   ALKPHOS 49 04/26/2017 0443   BILITOT 0.7 04/26/2017 0443   GFRNONAA 53 (L) 05/08/2017 0511   GFRAA >60 05/08/2017 0511     RADIOGRAPHIC STUDIES:  No results found.  ASSESSMENT and THERAPY PLAN:   Malignant neoplasm of sigmoid colon [facility]) This is a pleasant 83 year old female with metastatic colorectal cancer. She was recently discharged from the hospital for large bowel obstruction. She has a colostomy in place now. She is at a skilled nursing facility for rehabilitation to get stronger.  Patient was seen with Dr. Benay Spice.  Discussed options for the patient including treatment with chemotherapy versus comfort care with referral to hospice. At this point in time, the patient really is not strong enough to undergo chemotherapy. He is not really interested in pursuing it at this time either.  The patient's son has already talked with hospice of [facility] and they would like to pursue this option. Referral has been made to hospice and palliative care of [facility].  A message was sent to the wound ostomy nurse to recheck to the son for any assistance that she can provide to control the leaking of the colostomy.  Encouraged patient to take her antibiotics as needed and to use her hydrocodone as needed. She will continue her fentanyl patch.  We  will plan to see her back in approximately one month.   No orders of the defined types were placed in this encounter.   All questions were answered. The patient knows to call the clinic with any problems, questions or concerns. We can certainly see the patient much sooner if necessary.  Mikey Bussing, NP 05/13/2017   This was a shared visit with Erasmo Downer  Curcio. Ms. Zellman was interviewed and examined. She is accompanied by her son. She has a poor performance status. She is symptomatic with nausea and pain. She does not appear to be a candidate for systemic therapy. She is in agreement with Hospice care. We will make a referral to the [facility] program. We will ask the hospice team to help with pain and nausea medication. She will return for an office visit here in one month. We are available to see her sooner as needed.  Julieanne Manson, M.D.

## 2017-05-13 NOTE — Assessment & Plan Note (Signed)
This is a pleasant 83 year old female with metastatic colorectal cancer. She was recently discharged from the hospital for large bowel obstruction. She has a colostomy in place now. She is at a skilled nursing facility for rehabilitation to get stronger.  Patient was seen with Dr. Benay Spice.  Discussed options for the patient including treatment with chemotherapy versus comfort care with referral to hospice. At this point in time, the patient really is not strong enough to undergo chemotherapy. He is not really interested in pursuing it at this time either.  The patient's son has already talked with hospice of [facility] and they would like to pursue this option. Referral has been made to hospice and palliative care of [facility].  A message was sent to the wound ostomy nurse to recheck to the son for any assistance that she can provide to control the leaking of the colostomy.  Encouraged patient to take her antibiotics as needed and to use her hydrocodone as needed. She will continue her fentanyl patch.  We will plan to see her back in approximately one month.

## 2017-05-15 DIAGNOSIS — R310 Gross hematuria: Secondary | ICD-10-CM | POA: Diagnosis not present

## 2017-05-15 DIAGNOSIS — R531 Weakness: Secondary | ICD-10-CM | POA: Diagnosis not present

## 2017-05-23 DIAGNOSIS — F4321 Adjustment disorder with depressed mood: Secondary | ICD-10-CM | POA: Diagnosis not present

## 2017-05-23 DIAGNOSIS — C20 Malignant neoplasm of rectum: Secondary | ICD-10-CM | POA: Diagnosis not present

## 2017-05-23 DIAGNOSIS — C787 Secondary malignant neoplasm of liver and intrahepatic bile duct: Secondary | ICD-10-CM | POA: Diagnosis not present

## 2017-05-23 DIAGNOSIS — H3530 Unspecified macular degeneration: Secondary | ICD-10-CM | POA: Diagnosis not present

## 2017-05-23 DIAGNOSIS — I10 Essential (primary) hypertension: Secondary | ICD-10-CM | POA: Diagnosis not present

## 2017-05-23 DIAGNOSIS — J301 Allergic rhinitis due to pollen: Secondary | ICD-10-CM | POA: Diagnosis not present

## 2017-05-23 DIAGNOSIS — R1013 Epigastric pain: Secondary | ICD-10-CM | POA: Diagnosis not present

## 2017-05-23 DIAGNOSIS — H04123 Dry eye syndrome of bilateral lacrimal glands: Secondary | ICD-10-CM | POA: Diagnosis not present

## 2017-05-23 DIAGNOSIS — K219 Gastro-esophageal reflux disease without esophagitis: Secondary | ICD-10-CM | POA: Diagnosis not present

## 2017-05-23 DIAGNOSIS — D649 Anemia, unspecified: Secondary | ICD-10-CM | POA: Diagnosis not present

## 2017-05-23 DIAGNOSIS — K56601 Complete intestinal obstruction, unspecified as to cause: Secondary | ICD-10-CM | POA: Diagnosis not present

## 2017-05-23 DIAGNOSIS — R630 Anorexia: Secondary | ICD-10-CM | POA: Diagnosis not present

## 2017-05-24 DIAGNOSIS — C20 Malignant neoplasm of rectum: Secondary | ICD-10-CM | POA: Diagnosis not present

## 2017-05-24 DIAGNOSIS — C787 Secondary malignant neoplasm of liver and intrahepatic bile duct: Secondary | ICD-10-CM | POA: Diagnosis not present

## 2017-05-24 DIAGNOSIS — K56601 Complete intestinal obstruction, unspecified as to cause: Secondary | ICD-10-CM | POA: Diagnosis not present

## 2017-05-24 DIAGNOSIS — F4321 Adjustment disorder with depressed mood: Secondary | ICD-10-CM | POA: Diagnosis not present

## 2017-05-24 DIAGNOSIS — R1013 Epigastric pain: Secondary | ICD-10-CM | POA: Diagnosis not present

## 2017-05-24 DIAGNOSIS — R630 Anorexia: Secondary | ICD-10-CM | POA: Diagnosis not present

## 2017-05-25 DIAGNOSIS — C20 Malignant neoplasm of rectum: Secondary | ICD-10-CM | POA: Diagnosis not present

## 2017-05-25 DIAGNOSIS — R1013 Epigastric pain: Secondary | ICD-10-CM | POA: Diagnosis not present

## 2017-05-25 DIAGNOSIS — F4321 Adjustment disorder with depressed mood: Secondary | ICD-10-CM | POA: Diagnosis not present

## 2017-05-25 DIAGNOSIS — R630 Anorexia: Secondary | ICD-10-CM | POA: Diagnosis not present

## 2017-05-25 DIAGNOSIS — C787 Secondary malignant neoplasm of liver and intrahepatic bile duct: Secondary | ICD-10-CM | POA: Diagnosis not present

## 2017-05-25 DIAGNOSIS — K56601 Complete intestinal obstruction, unspecified as to cause: Secondary | ICD-10-CM | POA: Diagnosis not present

## 2017-05-28 DIAGNOSIS — C20 Malignant neoplasm of rectum: Secondary | ICD-10-CM | POA: Diagnosis not present

## 2017-05-28 DIAGNOSIS — F4321 Adjustment disorder with depressed mood: Secondary | ICD-10-CM | POA: Diagnosis not present

## 2017-05-28 DIAGNOSIS — K56601 Complete intestinal obstruction, unspecified as to cause: Secondary | ICD-10-CM | POA: Diagnosis not present

## 2017-05-28 DIAGNOSIS — R1013 Epigastric pain: Secondary | ICD-10-CM | POA: Diagnosis not present

## 2017-05-28 DIAGNOSIS — R630 Anorexia: Secondary | ICD-10-CM | POA: Diagnosis not present

## 2017-05-28 DIAGNOSIS — C787 Secondary malignant neoplasm of liver and intrahepatic bile duct: Secondary | ICD-10-CM | POA: Diagnosis not present

## 2017-06-01 DIAGNOSIS — C787 Secondary malignant neoplasm of liver and intrahepatic bile duct: Secondary | ICD-10-CM | POA: Diagnosis not present

## 2017-06-01 DIAGNOSIS — K56601 Complete intestinal obstruction, unspecified as to cause: Secondary | ICD-10-CM | POA: Diagnosis not present

## 2017-06-01 DIAGNOSIS — R1013 Epigastric pain: Secondary | ICD-10-CM | POA: Diagnosis not present

## 2017-06-01 DIAGNOSIS — C20 Malignant neoplasm of rectum: Secondary | ICD-10-CM | POA: Diagnosis not present

## 2017-06-01 DIAGNOSIS — R630 Anorexia: Secondary | ICD-10-CM | POA: Diagnosis not present

## 2017-06-01 DIAGNOSIS — F4321 Adjustment disorder with depressed mood: Secondary | ICD-10-CM | POA: Diagnosis not present

## 2017-06-02 DIAGNOSIS — K56601 Complete intestinal obstruction, unspecified as to cause: Secondary | ICD-10-CM | POA: Diagnosis not present

## 2017-06-02 DIAGNOSIS — F4321 Adjustment disorder with depressed mood: Secondary | ICD-10-CM | POA: Diagnosis not present

## 2017-06-02 DIAGNOSIS — C787 Secondary malignant neoplasm of liver and intrahepatic bile duct: Secondary | ICD-10-CM | POA: Diagnosis not present

## 2017-06-02 DIAGNOSIS — C20 Malignant neoplasm of rectum: Secondary | ICD-10-CM | POA: Diagnosis not present

## 2017-06-02 DIAGNOSIS — R630 Anorexia: Secondary | ICD-10-CM | POA: Diagnosis not present

## 2017-06-02 DIAGNOSIS — R1013 Epigastric pain: Secondary | ICD-10-CM | POA: Diagnosis not present

## 2017-06-04 DIAGNOSIS — C20 Malignant neoplasm of rectum: Secondary | ICD-10-CM | POA: Diagnosis not present

## 2017-06-04 DIAGNOSIS — C787 Secondary malignant neoplasm of liver and intrahepatic bile duct: Secondary | ICD-10-CM | POA: Diagnosis not present

## 2017-06-04 DIAGNOSIS — K56601 Complete intestinal obstruction, unspecified as to cause: Secondary | ICD-10-CM | POA: Diagnosis not present

## 2017-06-04 DIAGNOSIS — R630 Anorexia: Secondary | ICD-10-CM | POA: Diagnosis not present

## 2017-06-04 DIAGNOSIS — R1013 Epigastric pain: Secondary | ICD-10-CM | POA: Diagnosis not present

## 2017-06-04 DIAGNOSIS — F4321 Adjustment disorder with depressed mood: Secondary | ICD-10-CM | POA: Diagnosis not present

## 2017-06-05 DIAGNOSIS — J301 Allergic rhinitis due to pollen: Secondary | ICD-10-CM | POA: Diagnosis not present

## 2017-06-05 DIAGNOSIS — R1013 Epigastric pain: Secondary | ICD-10-CM | POA: Diagnosis not present

## 2017-06-05 DIAGNOSIS — K56601 Complete intestinal obstruction, unspecified as to cause: Secondary | ICD-10-CM | POA: Diagnosis not present

## 2017-06-05 DIAGNOSIS — H04123 Dry eye syndrome of bilateral lacrimal glands: Secondary | ICD-10-CM | POA: Diagnosis not present

## 2017-06-05 DIAGNOSIS — H3530 Unspecified macular degeneration: Secondary | ICD-10-CM | POA: Diagnosis not present

## 2017-06-05 DIAGNOSIS — C787 Secondary malignant neoplasm of liver and intrahepatic bile duct: Secondary | ICD-10-CM | POA: Diagnosis not present

## 2017-06-05 DIAGNOSIS — I10 Essential (primary) hypertension: Secondary | ICD-10-CM | POA: Diagnosis not present

## 2017-06-05 DIAGNOSIS — F4321 Adjustment disorder with depressed mood: Secondary | ICD-10-CM | POA: Diagnosis not present

## 2017-06-05 DIAGNOSIS — R630 Anorexia: Secondary | ICD-10-CM | POA: Diagnosis not present

## 2017-06-05 DIAGNOSIS — K219 Gastro-esophageal reflux disease without esophagitis: Secondary | ICD-10-CM | POA: Diagnosis not present

## 2017-06-05 DIAGNOSIS — D649 Anemia, unspecified: Secondary | ICD-10-CM | POA: Diagnosis not present

## 2017-06-05 DIAGNOSIS — C20 Malignant neoplasm of rectum: Secondary | ICD-10-CM | POA: Diagnosis not present

## 2017-06-08 DIAGNOSIS — C787 Secondary malignant neoplasm of liver and intrahepatic bile duct: Secondary | ICD-10-CM | POA: Diagnosis not present

## 2017-06-08 DIAGNOSIS — K56601 Complete intestinal obstruction, unspecified as to cause: Secondary | ICD-10-CM | POA: Diagnosis not present

## 2017-06-08 DIAGNOSIS — C20 Malignant neoplasm of rectum: Secondary | ICD-10-CM | POA: Diagnosis not present

## 2017-06-08 DIAGNOSIS — F4321 Adjustment disorder with depressed mood: Secondary | ICD-10-CM | POA: Diagnosis not present

## 2017-06-08 DIAGNOSIS — R630 Anorexia: Secondary | ICD-10-CM | POA: Diagnosis not present

## 2017-06-08 DIAGNOSIS — R1013 Epigastric pain: Secondary | ICD-10-CM | POA: Diagnosis not present

## 2017-06-10 DIAGNOSIS — C7889 Secondary malignant neoplasm of other digestive organs: Secondary | ICD-10-CM | POA: Diagnosis not present

## 2017-06-10 DIAGNOSIS — D649 Anemia, unspecified: Secondary | ICD-10-CM | POA: Diagnosis not present

## 2017-06-10 DIAGNOSIS — K5900 Constipation, unspecified: Secondary | ICD-10-CM | POA: Diagnosis not present

## 2017-06-10 DIAGNOSIS — R110 Nausea: Secondary | ICD-10-CM | POA: Diagnosis not present

## 2017-06-10 DIAGNOSIS — L299 Pruritus, unspecified: Secondary | ICD-10-CM | POA: Diagnosis not present

## 2017-06-10 DIAGNOSIS — H04129 Dry eye syndrome of unspecified lacrimal gland: Secondary | ICD-10-CM | POA: Diagnosis not present

## 2017-06-10 DIAGNOSIS — C189 Malignant neoplasm of colon, unspecified: Secondary | ICD-10-CM | POA: Diagnosis not present

## 2017-06-10 DIAGNOSIS — R52 Pain, unspecified: Secondary | ICD-10-CM | POA: Diagnosis not present

## 2017-06-11 DIAGNOSIS — K56601 Complete intestinal obstruction, unspecified as to cause: Secondary | ICD-10-CM | POA: Diagnosis not present

## 2017-06-11 DIAGNOSIS — F4321 Adjustment disorder with depressed mood: Secondary | ICD-10-CM | POA: Diagnosis not present

## 2017-06-11 DIAGNOSIS — C20 Malignant neoplasm of rectum: Secondary | ICD-10-CM | POA: Diagnosis not present

## 2017-06-11 DIAGNOSIS — R1013 Epigastric pain: Secondary | ICD-10-CM | POA: Diagnosis not present

## 2017-06-11 DIAGNOSIS — R630 Anorexia: Secondary | ICD-10-CM | POA: Diagnosis not present

## 2017-06-11 DIAGNOSIS — C787 Secondary malignant neoplasm of liver and intrahepatic bile duct: Secondary | ICD-10-CM | POA: Diagnosis not present

## 2017-06-12 DIAGNOSIS — F4321 Adjustment disorder with depressed mood: Secondary | ICD-10-CM | POA: Diagnosis not present

## 2017-06-12 DIAGNOSIS — R630 Anorexia: Secondary | ICD-10-CM | POA: Diagnosis not present

## 2017-06-12 DIAGNOSIS — K56601 Complete intestinal obstruction, unspecified as to cause: Secondary | ICD-10-CM | POA: Diagnosis not present

## 2017-06-12 DIAGNOSIS — C20 Malignant neoplasm of rectum: Secondary | ICD-10-CM | POA: Diagnosis not present

## 2017-06-12 DIAGNOSIS — R1013 Epigastric pain: Secondary | ICD-10-CM | POA: Diagnosis not present

## 2017-06-12 DIAGNOSIS — C787 Secondary malignant neoplasm of liver and intrahepatic bile duct: Secondary | ICD-10-CM | POA: Diagnosis not present

## 2017-06-13 DIAGNOSIS — F4321 Adjustment disorder with depressed mood: Secondary | ICD-10-CM | POA: Diagnosis not present

## 2017-06-13 DIAGNOSIS — R1013 Epigastric pain: Secondary | ICD-10-CM | POA: Diagnosis not present

## 2017-06-13 DIAGNOSIS — R630 Anorexia: Secondary | ICD-10-CM | POA: Diagnosis not present

## 2017-06-13 DIAGNOSIS — K56601 Complete intestinal obstruction, unspecified as to cause: Secondary | ICD-10-CM | POA: Diagnosis not present

## 2017-06-13 DIAGNOSIS — C787 Secondary malignant neoplasm of liver and intrahepatic bile duct: Secondary | ICD-10-CM | POA: Diagnosis not present

## 2017-06-13 DIAGNOSIS — C20 Malignant neoplasm of rectum: Secondary | ICD-10-CM | POA: Diagnosis not present

## 2017-06-14 DIAGNOSIS — C20 Malignant neoplasm of rectum: Secondary | ICD-10-CM | POA: Diagnosis not present

## 2017-06-14 DIAGNOSIS — F4321 Adjustment disorder with depressed mood: Secondary | ICD-10-CM | POA: Diagnosis not present

## 2017-06-14 DIAGNOSIS — R1013 Epigastric pain: Secondary | ICD-10-CM | POA: Diagnosis not present

## 2017-06-14 DIAGNOSIS — C787 Secondary malignant neoplasm of liver and intrahepatic bile duct: Secondary | ICD-10-CM | POA: Diagnosis not present

## 2017-06-14 DIAGNOSIS — K56601 Complete intestinal obstruction, unspecified as to cause: Secondary | ICD-10-CM | POA: Diagnosis not present

## 2017-06-14 DIAGNOSIS — R630 Anorexia: Secondary | ICD-10-CM | POA: Diagnosis not present

## 2017-06-15 DIAGNOSIS — K56601 Complete intestinal obstruction, unspecified as to cause: Secondary | ICD-10-CM | POA: Diagnosis not present

## 2017-06-15 DIAGNOSIS — R1013 Epigastric pain: Secondary | ICD-10-CM | POA: Diagnosis not present

## 2017-06-15 DIAGNOSIS — F4321 Adjustment disorder with depressed mood: Secondary | ICD-10-CM | POA: Diagnosis not present

## 2017-06-15 DIAGNOSIS — C787 Secondary malignant neoplasm of liver and intrahepatic bile duct: Secondary | ICD-10-CM | POA: Diagnosis not present

## 2017-06-15 DIAGNOSIS — C20 Malignant neoplasm of rectum: Secondary | ICD-10-CM | POA: Diagnosis not present

## 2017-06-15 DIAGNOSIS — R630 Anorexia: Secondary | ICD-10-CM | POA: Diagnosis not present

## 2017-06-16 DIAGNOSIS — R630 Anorexia: Secondary | ICD-10-CM | POA: Diagnosis not present

## 2017-06-16 DIAGNOSIS — C20 Malignant neoplasm of rectum: Secondary | ICD-10-CM | POA: Diagnosis not present

## 2017-06-16 DIAGNOSIS — C787 Secondary malignant neoplasm of liver and intrahepatic bile duct: Secondary | ICD-10-CM | POA: Diagnosis not present

## 2017-06-16 DIAGNOSIS — K56601 Complete intestinal obstruction, unspecified as to cause: Secondary | ICD-10-CM | POA: Diagnosis not present

## 2017-06-16 DIAGNOSIS — R1013 Epigastric pain: Secondary | ICD-10-CM | POA: Diagnosis not present

## 2017-06-16 DIAGNOSIS — F4321 Adjustment disorder with depressed mood: Secondary | ICD-10-CM | POA: Diagnosis not present

## 2017-06-17 DIAGNOSIS — C787 Secondary malignant neoplasm of liver and intrahepatic bile duct: Secondary | ICD-10-CM | POA: Diagnosis not present

## 2017-06-17 DIAGNOSIS — F4321 Adjustment disorder with depressed mood: Secondary | ICD-10-CM | POA: Diagnosis not present

## 2017-06-17 DIAGNOSIS — R1013 Epigastric pain: Secondary | ICD-10-CM | POA: Diagnosis not present

## 2017-06-17 DIAGNOSIS — K56601 Complete intestinal obstruction, unspecified as to cause: Secondary | ICD-10-CM | POA: Diagnosis not present

## 2017-06-17 DIAGNOSIS — C20 Malignant neoplasm of rectum: Secondary | ICD-10-CM | POA: Diagnosis not present

## 2017-06-17 DIAGNOSIS — R630 Anorexia: Secondary | ICD-10-CM | POA: Diagnosis not present

## 2017-06-18 DIAGNOSIS — F4321 Adjustment disorder with depressed mood: Secondary | ICD-10-CM | POA: Diagnosis not present

## 2017-06-18 DIAGNOSIS — R630 Anorexia: Secondary | ICD-10-CM | POA: Diagnosis not present

## 2017-06-18 DIAGNOSIS — C787 Secondary malignant neoplasm of liver and intrahepatic bile duct: Secondary | ICD-10-CM | POA: Diagnosis not present

## 2017-06-18 DIAGNOSIS — C20 Malignant neoplasm of rectum: Secondary | ICD-10-CM | POA: Diagnosis not present

## 2017-06-18 DIAGNOSIS — K56601 Complete intestinal obstruction, unspecified as to cause: Secondary | ICD-10-CM | POA: Diagnosis not present

## 2017-06-18 DIAGNOSIS — R1013 Epigastric pain: Secondary | ICD-10-CM | POA: Diagnosis not present

## 2017-06-20 DIAGNOSIS — F4321 Adjustment disorder with depressed mood: Secondary | ICD-10-CM | POA: Diagnosis not present

## 2017-06-20 DIAGNOSIS — R1013 Epigastric pain: Secondary | ICD-10-CM | POA: Diagnosis not present

## 2017-06-20 DIAGNOSIS — C787 Secondary malignant neoplasm of liver and intrahepatic bile duct: Secondary | ICD-10-CM | POA: Diagnosis not present

## 2017-06-20 DIAGNOSIS — C20 Malignant neoplasm of rectum: Secondary | ICD-10-CM | POA: Diagnosis not present

## 2017-06-20 DIAGNOSIS — K56601 Complete intestinal obstruction, unspecified as to cause: Secondary | ICD-10-CM | POA: Diagnosis not present

## 2017-06-20 DIAGNOSIS — R630 Anorexia: Secondary | ICD-10-CM | POA: Diagnosis not present

## 2017-06-22 ENCOUNTER — Ambulatory Visit: Payer: Medicare Other | Admitting: Oncology

## 2017-07-05 DEATH — deceased

## 2018-12-27 IMAGING — CT CT ABD-PELV W/ CM
2 of 8 series · 10 of 46 positions shown, 16 images · IV contrast (ISOVUE 300)
Comparison: CT scan 02/11/2017

CLINICAL DATA: Sigmoid colon cancer stage IIIB status post sigmoid
colectomy.

EXAM:
CT CHEST, ABDOMEN, AND PELVIS WITH CONTRAST
TECHNIQUE: Multidetector CT imaging of the chest, abdomen and pelvis was
performed following the standard protocol during bolus
administration of intravenous contrast.
CONTRAST:  80mL MQYAMI-E22 IOPAMIDOL (MQYAMI-E22) INJECTION 61%

[Series 2: axial post · axial · 0.77mm/px · z∈[+1075,+1525]mm · 7 of 121 slices shown, 12 images]
[im 16/121  soft-tissue]
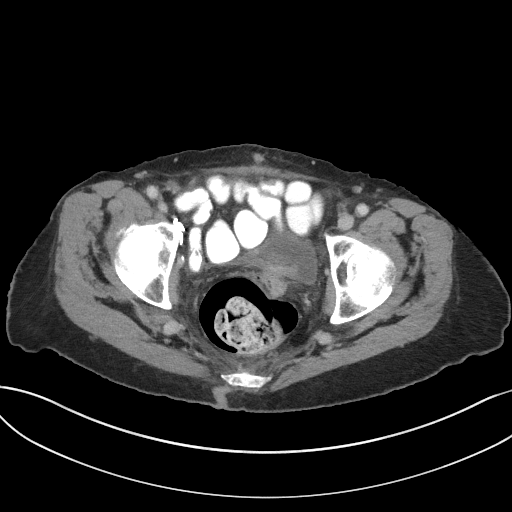
[im 16/121  bone]
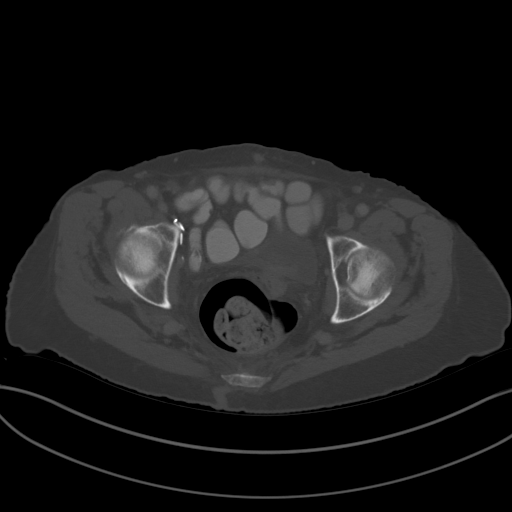
[im 31/121  soft-tissue]
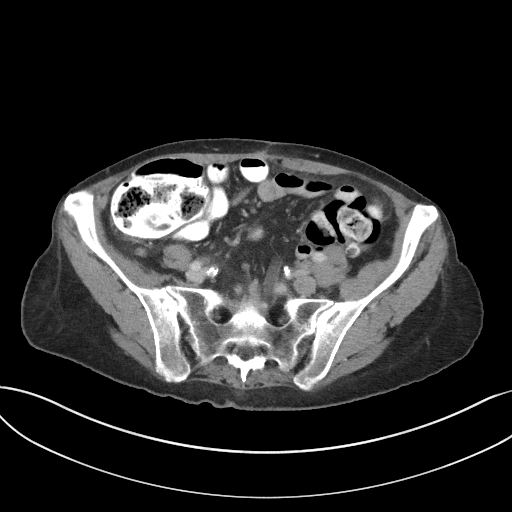
[im 46/121  soft-tissue]
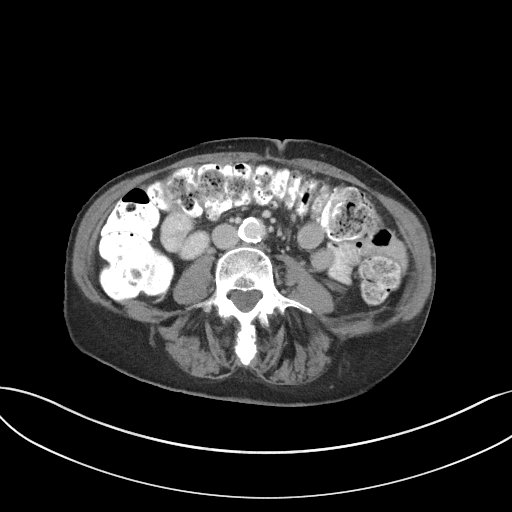
[im 61/121  soft-tissue]
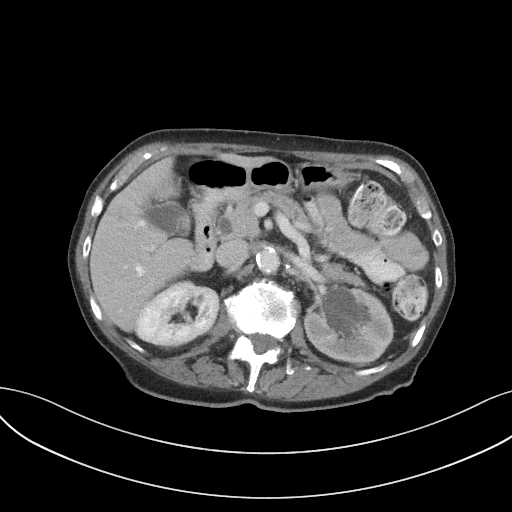
[im 61/121  lung]
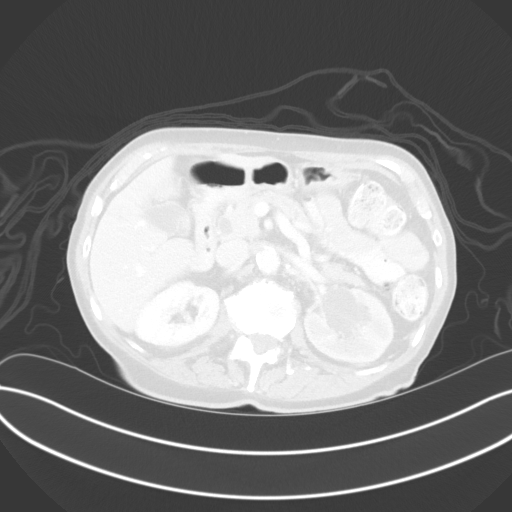
[im 76/121  soft-tissue]
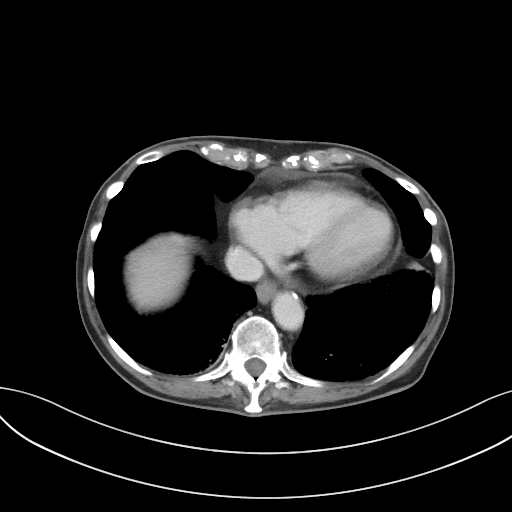
[im 76/121  lung]
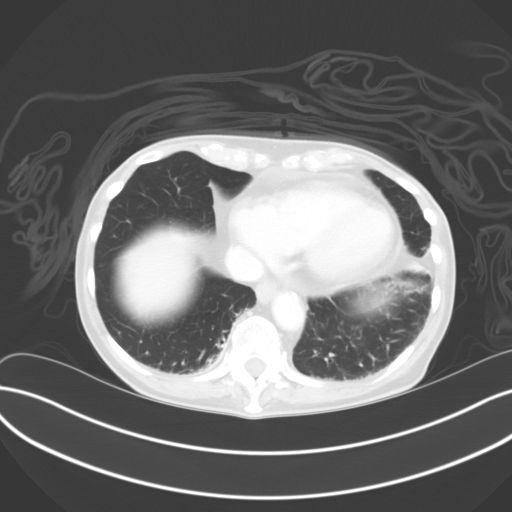
[im 91/121  soft-tissue]
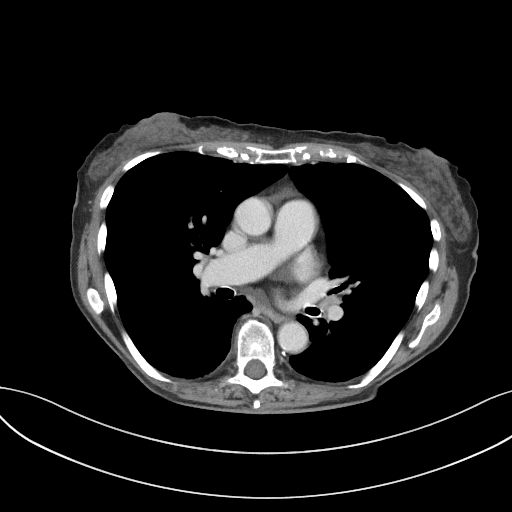
[im 91/121  lung]
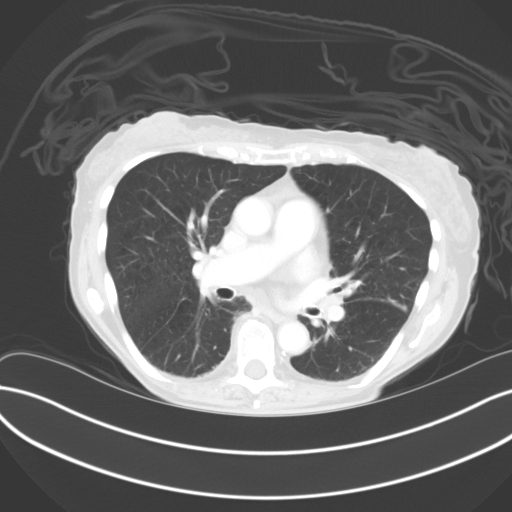
[im 106/121  soft-tissue]
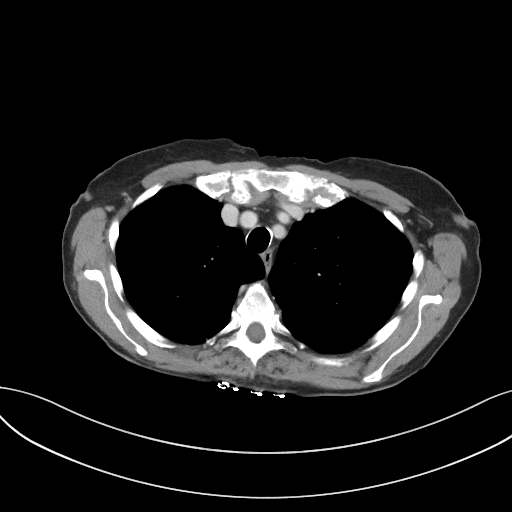
[im 106/121  lung]
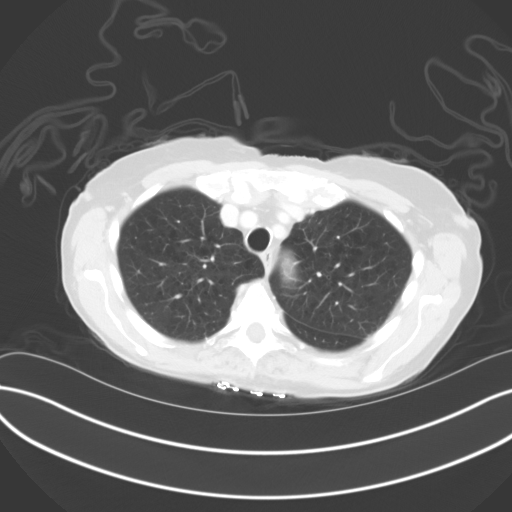

[Series 4: coronal post · coronal · 0.84mm/px · 3 of 78 slices shown, 4 images]
[im 20/78  soft-tissue]
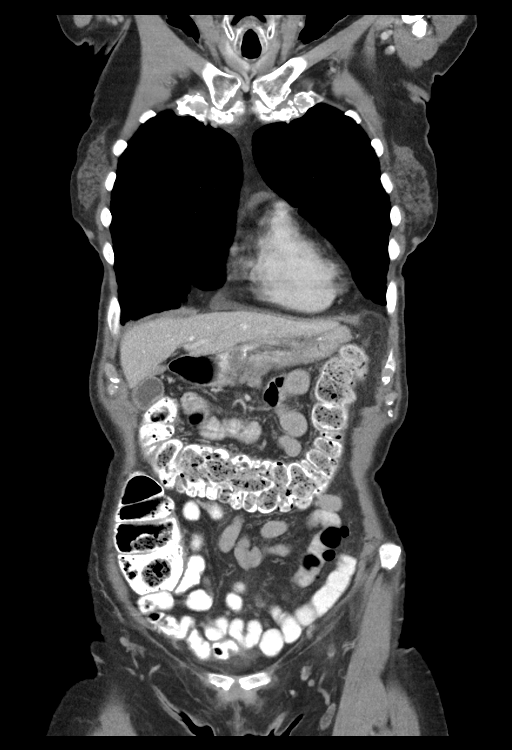
[im 39/78  soft-tissue]
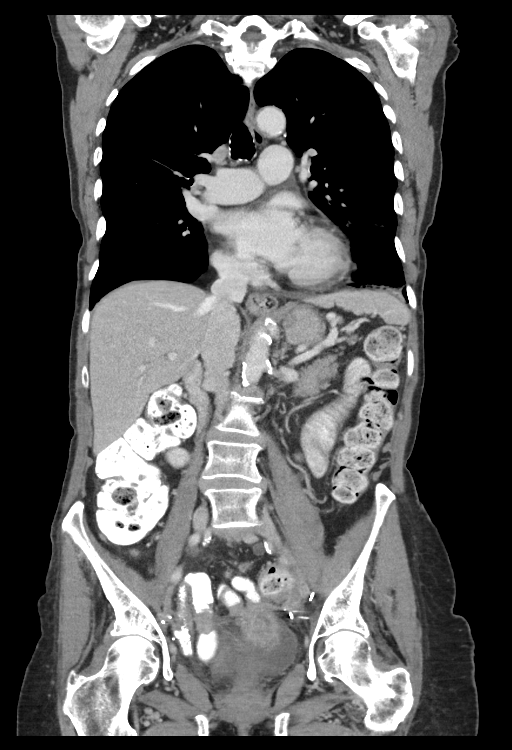
[im 39/78  bone]
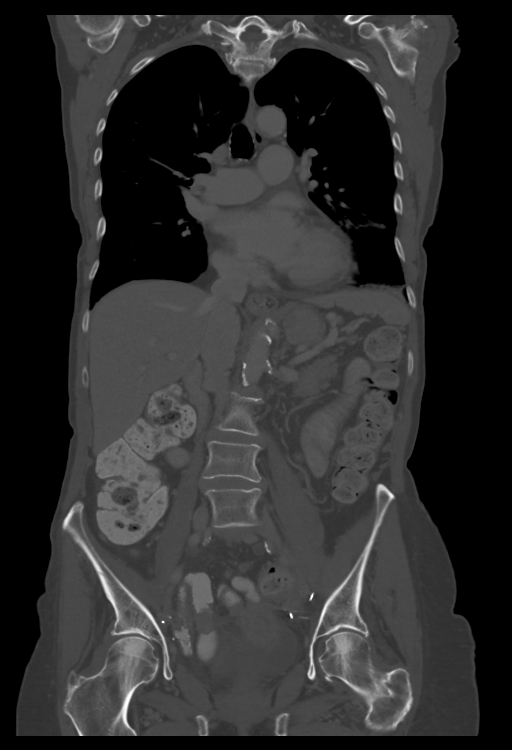
[im 58/78  soft-tissue]
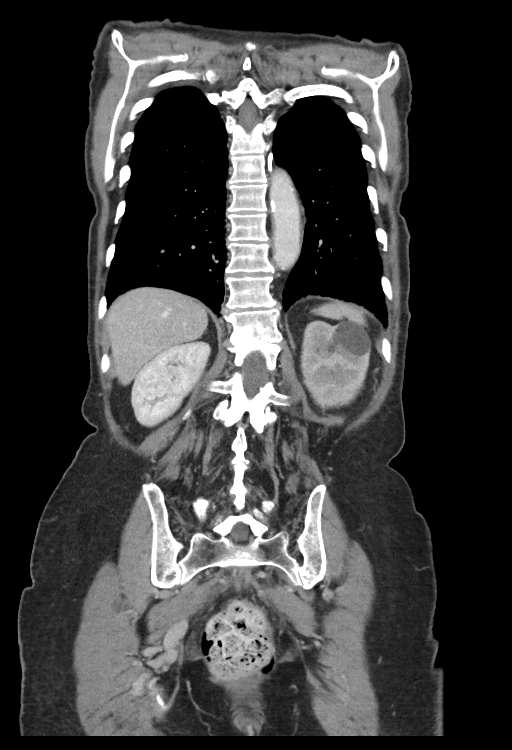

[10 of 46 positions shown; findings below may reference images not displayed]

FINDINGS: CT CHEST FINDINGS

Cardiovascular: The heart is normal in size. Small amount of
pericardial fluid which is new. The aorta is normal in caliber.
Stable atherosclerotic calcifications. No dissection. The branch
vessels are patent. Stable coronary artery calcifications.

Mediastinum/Nodes: Small scattered mediastinal and hilar lymph nodes
but no mass or overt adenopathy. The esophagus is grossly normal.

Lungs/Pleura: Advanced emphysematous changes and pulmonary scarring.
No worrisome pulmonary lesions to suggest metastatic disease.

Chest wall/ Musculoskeletal: No breast masses, supraclavicular or
axillary lymphadenopathy. Small scattered lymph nodes are noted. The
thyroid gland is grossly normal. A few small nodules are noted.

No significant bony findings. No findings to suggest metastatic
disease.

CT ABDOMEN PELVIS FINDINGS

Hepatobiliary: New low-attenuation liver lesions are worrisome for
new metastatic disease. The largest lesion is near the caudate lobe
on image number 55 and measures 9.5 mm. No intrahepatic biliary
dilatation. The gallbladder is normal. No common bile duct
dilatation.

Pancreas: No mass, inflammation or ductal dilatation.

Spleen: Normal size.  No focal lesions.

Adrenals/Urinary Tract: The adrenal glands are normal.

Stable right renal cyst.

New hydronephrosis of the left kidney with decreased perfusion and
left-sided hydroureter due to recurrent tumor in the pelvis.

The bladder is grossly normal.

Stomach/Bowel: The stomach, duodenum, small bowel and colon are
grossly normal except for recurrent tumor at the sigmoid colon
anastomosis site with fairly significant colonic narrowing and
upstream constipation.

Vascular/Lymphatic: Stable advanced atherosclerotic calcifications
involving the aorta and branch vessels. The major venous structures
are patent.

New left-sided retroperitoneal lymphadenopathy. There is a somewhat
necrotic appearing 2.2 x 2.0 cm lymph node on image number 83
adjacent to the left common iliac artery along with smaller adjacent
lymph nodes. New line new soft tissue mass in the sigmoid mesocolon
on image number 94 measuring 3.7 x 3.3 cm. This is likely
obstructing the left ureter. More inferiorly there is a second mass
which appears to involve the uterus with thick irregular soft tissue
developing the uterus. Cannot exclude tumor surrounding small bowel
loops in the right pelvis on image number 101.

Reproductive: Small uterus or uterine remnant appears surrounded by
tumor.

Other: No inguinal mass or adenopathy.

Musculoskeletal: No significant bony findings.
IMPRESSION: 1. New metastatic hepatic lesions, retroperitoneal lymphadenopathy
and fairly extensive recurrent tumor in the pelvis which is
obstructing the left ureter and causing significant narrowing of the
sigmoid colon at the anastomosis site.
2. Suspect infiltrating tumor crossing the midline and involving the
right lower pelvic small bowel loops.
3. No findings for metastatic disease involving the chest.
4. Small pericardial effusion.
5. Advanced atherosclerotic calcifications involving the thoracic
and abdominal aorta and branch vessels.
